# Patient Record
Sex: Male | Born: 1935 | Race: White | Hispanic: No | Smoking: Never smoker
Health system: Southern US, Community
[De-identification: ages and names within clinical notes are randomized; demographics above are authoritative.]

## PROBLEM LIST (undated history)

## (undated) ENCOUNTER — Emergency Department: Discharge status: Absent without leave | Payer: Medicare HMO

## (undated) DIAGNOSIS — R911 Solitary pulmonary nodule: Secondary | ICD-10-CM

## (undated) DIAGNOSIS — I10 Essential (primary) hypertension: Secondary | ICD-10-CM

## (undated) DIAGNOSIS — R7303 Prediabetes: Secondary | ICD-10-CM

## (undated) DIAGNOSIS — E782 Mixed hyperlipidemia: Secondary | ICD-10-CM

## (undated) DIAGNOSIS — E559 Vitamin D deficiency, unspecified: Secondary | ICD-10-CM

## (undated) DIAGNOSIS — C61 Malignant neoplasm of prostate: Secondary | ICD-10-CM

## (undated) HISTORY — DX: Vitamin D deficiency, unspecified: E55.9

## (undated) HISTORY — DX: Mixed hyperlipidemia: E78.2

## (undated) HISTORY — DX: Prediabetes: R73.03

## (undated) HISTORY — DX: Solitary pulmonary nodule: R91.1

## (undated) HISTORY — PX: TONSILECTOMY/ADENOIDECTOMY WITH MYRINGOTOMY: SHX6125

## (undated) HISTORY — PX: TREATMENT FISTULA ANAL: SUR1390

## (undated) HISTORY — DX: Essential (primary) hypertension: I10

---

## 1997-08-28 ENCOUNTER — Ambulatory Visit (HOSPITAL_COMMUNITY): Admission: RE | Admit: 1997-08-28 | Discharge: 1997-08-28 | Payer: Self-pay | Admitting: Internal Medicine

## 2000-10-07 ENCOUNTER — Ambulatory Visit (HOSPITAL_COMMUNITY): Admission: RE | Admit: 2000-10-07 | Discharge: 2000-10-07 | Payer: Self-pay | Admitting: Internal Medicine

## 2000-10-07 ENCOUNTER — Encounter: Payer: Self-pay | Admitting: Internal Medicine

## 2003-06-25 ENCOUNTER — Encounter (INDEPENDENT_AMBULATORY_CARE_PROVIDER_SITE_OTHER): Payer: Self-pay | Admitting: Specialist

## 2003-06-25 ENCOUNTER — Ambulatory Visit (HOSPITAL_COMMUNITY): Admission: RE | Admit: 2003-06-25 | Discharge: 2003-06-25 | Payer: Self-pay | Admitting: General Surgery

## 2003-07-09 ENCOUNTER — Ambulatory Visit (HOSPITAL_COMMUNITY): Admission: RE | Admit: 2003-07-09 | Discharge: 2003-07-09 | Payer: Self-pay | Admitting: General Surgery

## 2006-11-11 ENCOUNTER — Ambulatory Visit (HOSPITAL_COMMUNITY): Admission: RE | Admit: 2006-11-11 | Discharge: 2006-11-11 | Payer: Self-pay | Admitting: Internal Medicine

## 2006-11-12 ENCOUNTER — Encounter: Admission: RE | Admit: 2006-11-12 | Discharge: 2006-11-12 | Payer: Self-pay | Admitting: Internal Medicine

## 2006-11-19 ENCOUNTER — Ambulatory Visit (HOSPITAL_COMMUNITY): Admission: RE | Admit: 2006-11-19 | Discharge: 2006-11-19 | Payer: Self-pay | Admitting: Internal Medicine

## 2006-11-25 ENCOUNTER — Ambulatory Visit: Payer: Self-pay | Admitting: Internal Medicine

## 2006-11-25 LAB — CONVERTED CEMR LAB
ALT: 22 units/L (ref 0–53)
AST: 24 units/L (ref 0–37)
Albumin: 3.9 g/dL (ref 3.5–5.2)
Alkaline Phosphatase: 49 units/L (ref 39–117)
Angiotensin 1 Converting Enzyme: 39 units/L (ref 9–67)
BUN: 14 mg/dL (ref 6–23)
Basophils Absolute: 0 10*3/uL (ref 0.0–0.1)
Basophils Relative: 0.1 % (ref 0.0–1.0)
CEA: 1.5 ng/mL (ref 0.0–5.0)
CO2: 30 meq/L (ref 19–32)
Calcium: 9.4 mg/dL (ref 8.4–10.5)
Chloride: 105 meq/L (ref 96–112)
Creatinine, Ser: 1.1 mg/dL (ref 0.4–1.5)
Eosinophils Absolute: 0 10*3/uL (ref 0.0–0.6)
Eosinophils Relative: 0.6 % (ref 0.0–5.0)
GFR calc Af Amer: 85 mL/min
GFR calc non Af Amer: 70 mL/min
Glucose, Bld: 111 mg/dL — ABNORMAL HIGH (ref 70–99)
HCT: 44.7 % (ref 39.0–52.0)
Hemoglobin: 15.3 g/dL (ref 13.0–17.0)
Lymphocytes Relative: 19.8 % (ref 12.0–46.0)
MCHC: 34.1 g/dL (ref 30.0–36.0)
MCV: 91.1 fL (ref 78.0–100.0)
Monocytes Absolute: 0.4 10*3/uL (ref 0.2–0.7)
Monocytes Relative: 8.4 % (ref 3.0–11.0)
Neutro Abs: 3.9 10*3/uL (ref 1.4–7.7)
Neutrophils Relative %: 71.1 % (ref 43.0–77.0)
Platelets: 128 10*3/uL — ABNORMAL LOW (ref 150–400)
Potassium: 4.3 meq/L (ref 3.5–5.1)
RBC: 4.91 M/uL (ref 4.22–5.81)
RDW: 11.4 % — ABNORMAL LOW (ref 11.5–14.6)
Sed Rate: 4 mm/hr (ref 0–20)
Sodium: 139 meq/L (ref 135–145)
Total Bilirubin: 0.8 mg/dL (ref 0.3–1.2)
Total Protein: 6.5 g/dL (ref 6.0–8.3)
WBC: 5.3 10*3/uL (ref 4.5–10.5)

## 2007-01-21 ENCOUNTER — Ambulatory Visit: Payer: Self-pay | Admitting: Internal Medicine

## 2007-02-14 ENCOUNTER — Encounter: Admission: RE | Admit: 2007-02-14 | Discharge: 2007-02-14 | Payer: Self-pay | Admitting: Internal Medicine

## 2007-02-15 DIAGNOSIS — I10 Essential (primary) hypertension: Secondary | ICD-10-CM | POA: Insufficient documentation

## 2007-02-15 DIAGNOSIS — R911 Solitary pulmonary nodule: Secondary | ICD-10-CM

## 2007-02-15 HISTORY — DX: Essential (primary) hypertension: I10

## 2007-02-15 HISTORY — DX: Solitary pulmonary nodule: R91.1

## 2007-06-20 ENCOUNTER — Ambulatory Visit: Payer: Self-pay | Admitting: Internal Medicine

## 2007-06-21 ENCOUNTER — Telehealth (INDEPENDENT_AMBULATORY_CARE_PROVIDER_SITE_OTHER): Payer: Self-pay | Admitting: *Deleted

## 2007-10-18 ENCOUNTER — Ambulatory Visit: Payer: Self-pay | Admitting: Internal Medicine

## 2008-06-19 ENCOUNTER — Ambulatory Visit: Payer: Self-pay | Admitting: Internal Medicine

## 2010-04-15 NOTE — Progress Notes (Signed)
Summary: corrections  Phone Note Call from Patient   Caller: Patient Call For: wert Summary of Call: meds list is wrong... Corrections- Vitamin D 1000, 2 per day should be Vitamin D 2000, 2 per day.  Also Flaxseed oil 1 per day sould be Flaxseed Oil, 2 per day Initial call taken by: Eugene Gavia,  June 21, 2007 9:05 AM    New/Updated Medications: FLAXSEED OIL 1000 MG  CAPS (FLAXSEED (LINSEED)) Take 2  tablet by mouth once a day VITAMIN D 2000 UNIT  TABS (CHOLECALCIFEROL) 2 by mouth once daily

## 2010-04-15 NOTE — Assessment & Plan Note (Signed)
Summary: fu////kp   Visit Type:  Follow-up PCP:  [facility]  Chief Complaint:  follow-up.  History of Present Illness: A 77 year old white male never smoker with remote h/o sarcoidosis who originally came to medical attention after he "hurt his chest" in August of 2008 with a vague density in the right mid chest that could actually be seen on a plain chest x-ray with  a CT scan suggesting a right middle lobe nodule.  This occurred in the setting of having previously been diagnosed with sarcoid with extensive mediastinal hilar adenopathy.  PET scanning was inconclusive and he returns today feeling fine with no recurrent chest discomfort, fevers, chills, sweats, unintended weight loss, cough or dyspnea.     Current Allergies: No known allergies   Past Medical History:    Current Problems:     HYPERTENSION (ICD-401.9)    PULMONARY NODULE (ICD-518.89)             Current Meds:     ATENOLOL 50 MG  TABS (ATENOLOL) Take 1 tablet by mouth once a day    FLAXSEED OIL 1000 MG  CAPS (FLAXSEED (LINSEED)) Take 1 tablet by mouth once a day    ADULT ASPIRIN LOW STRENGTH 81 MG  TBDP (ASPIRIN) Take 1 tablet by mouth once a day    VITAMIN D 1000 UNIT  TABS (CHOLECALCIFEROL) 2 tabs once daily    FINASTERIDE 5 MG  TABS (FINASTERIDE) One tablet each day    PRAVACHOL 20 MG  TABS (PRAVASTATIN SODIUM) One tablet daily.        Family History:    neg for resp dz, sarcoid or ca  Social History:    Patient never smoked.    Risk Factors:  Tobacco use:  never   Review of Systems  The patient denies anorexia, fever, weight loss, weight gain, vision loss, decreased hearing, hoarseness, chest pain, syncope, dyspnea on exhertion, peripheral edema, prolonged cough, hemoptysis, abdominal pain, melena, hematochezia, severe indigestion/heartburn, hematuria, incontinence, muscle weakness, suspicious skin lesions, transient blindness, difficulty walking, depression, unusual weight change, abnormal  bleeding, and enlarged lymph nodes.     Vital Signs:  Patient Profile:   77 Years Old Male Height:     67 inches Weight:      146.50 pounds O2 Sat:      97 % O2 treatment:    Room Air Temp:     97.8 degrees F oral Pulse rate:   81 / minute BP sitting:   160 / 82  (left arm)  Vitals Entered By: Marinus Maw (June 20, 2007 3:55 PM)             Comments Medications reviewed with patient ..................................................................Marland KitchenMarinus Maw  June 20, 2007 3:57 PM      Physical Exam  relatively thin but healthy appearing ambulatory white male in no acute distress. Afeb with normal vital signs HEENT: nl dentition, turbinates, and orophanx. Nl external ear canals without cough reflex Neck without JVD/Nodes/TM Lungs clear to A and P bilaterally without cough on insp or exp maneuvers RRR no s3 or murmur or increase in P2 Abd soft and benign with nl excursion in the supine position. No bruits or organomegaly Ext warm without calf tenderness, cyanosis clubbing or edema Skin warm and dry without lesions       Impression & Recommendations:  Problem # 1:  PULMONARY NODULE (ICD-518.89) is present chest x-ray shows no convincing evidence of a nodule in the area that was suspicious previously on chest x-ray  dated November 11, 2006.  Therefore it is very unlikely that we are dealing with a evolving mass in a never smoker and I simply recommended a follow-up chest x-ray in 6 months rather than continue do CT scans.   Orders: T-2 View CXR, Same Day (71020.5TC) Est. Patient Level III (16109)   Medications Added to Medication List This Visit: 1)  Finasteride 5 Mg Tabs (Finasteride) .... One tablet each day 2)  Pravachol 20 Mg Tabs (Pravastatin sodium) .... One tablet daily.   Patient Instructions: 1)  Please schedule a follow-up appointment in 6 months, sooner for weight loss or cough or chest pain    ]  Appended Document: fu////kp  done,copy will be faxed.

## 2010-04-15 NOTE — Assessment & Plan Note (Signed)
Summary: Pulmonary/ final fu nodule   Primary Provider/Referring Provider:  [facility]  CC:  75 month followup with cxr.  No complaints today.Matthew Aguirre  History of Present Illness: 47 ywm never smoker with  h/o sarcoidosis  1988 who originally came to medical attention after he "hurt his chest" in August of 2008 with a vague density in the right mid chest that could actually be seen on a plain chest x-ray with  a CT scan suggesting a right middle lobe nodule.  This occurred in the setting of having previously been diagnosed with sarcoid in 1988 on prednsione for sev months and weaned off.   06/19/73  cxr showed improvement in nodule easily seen prev on cxr  June 19, 2008  returns for "6 mo" fu cxr no cough, cp, or sob, wt loss, night sweats, rash, myalgias or arthralgias.  Current Medications (verified): 1)  Atenolol 50 Mg  Tabs (Atenolol) .... Take 1 Tablet By Mouth Once A Day 2)  Adult Aspirin Low Strength 81 Mg  Tbdp (Aspirin) .... Take 1 Tablet By Mouth Once A Day 3)  Vitamin D3 10000 Unit Caps (Cholecalciferol) .Matthew Aguirre.. 1 4 Times A Day 4)  Finasteride 5 Mg  Tabs (Finasteride) .... One Tablet Each Day 5)  Fenofibrate Micronized 134 Mg  Caps (Fenofibrate Micronized) .... Once Daily 6)  Flaxseed Oil 1000 Mg  Caps (Flaxseed (Linseed)) .... Two Times A Day 7)  Multivitamins   Tabs (Multiple Vitamin) .... Once Daily  Allergies (verified): No Known Drug Allergies  Past History:  Past Medical History:    HYPERTENSION (ICD-401.9)    PULMONARY NODULE (ICD-518.89)      -by cxr 8/08      -Final fu cxr June 19, 2008     Pulmonary Sacroidosis     - dx 1988 by Alwyn Ren and prednisone x a few months    .       Vital Signs:  Patient profile:   77 year old male Weight:      140 pounds BMI:     22.01 Temp:     98.0 degrees F oral Pulse rate:   94 / minute BP sitting:   140 / 78  (left arm)  Vitals Entered By: Vernie Murders (June 19, 2008 9:43 AM)  O2 Sat on room air at rest %:  98  Physical  Exam  Additional Exam:  relatively thin but healthy appearing ambulatory white male in no acute distress. Afeb with normal vital signs wt 139 > 140 June 19, 2008  HEENT: nl dentition, turbinates, and orophanx. Nl external ear canals without cough reflex Neck without JVD/Nodes/TM Lungs clear to A and P bilaterally without cough on insp or exp maneuvers RRR no s3 or murmur or increase in P2 Abd soft and benign with nl excursion in the supine position. No bruits or organomegaly Ext warm without calf tenderness, cyanosis clubbing or edema Skin warm and dry without lesions     CXR  Procedure date:  06/19/2008  Findings:      wnl  Impression & Recommendations:  Problem # 1:  PULMONARY NODULE (ICD-518.89) No evidence of active lesions, nodules or otherwise. Most likely this was inflammatory, and doubt sarcoid so many years after initial dx.  Pulmonary fu prn  Medications Added to Medication List This Visit: 1)  Vitamin D3 10000 Unit Caps (Cholecalciferol) .Matthew Aguirre.. 1 4 times a day  Other Orders: T-1 View CXR, Same Day (71010.6TC)  Patient Instructions: 1)  Return for  unexplained cough, short of breath, pain when breathing or weight loss  Appended Document: Orders Update    Clinical Lists Changes       Appended Document: Orders Update    Clinical Lists Changes  Orders: Added new Service order of Est. Patient Level III (16109) - Signed

## 2010-04-15 NOTE — Assessment & Plan Note (Signed)
Summary: Pulmonary/fu nodule   PCP:  [facility]  Chief Complaint:  Pt returns for 4 month followup.  He has no complaints today and breathing is well.Marland Kitchen  History of Present Illness: A 77 year old white male never smoker with  h/o sarcoidosis  1988 who originally came to medical attention after he "hurt his chest" in August of 2008 with a vague density in the right mid chest that could actually be seen on a plain chest x-ray with  a CT scan suggesting a right middle lobe nodule.  This occurred in the setting of having previously been diagnosed with sarcoid with extensive mediastinal hilar adenopathy.  PET scanning was inconclusive and he returns today feeling fine with no recurrent chest discomfort, fevers, chills, sweats, unintended weight loss, cough or dyspnea.  06/20/07  cxr showed improvement in nodule easily seen prev on cxr  October 18, 2007  returns for "6 mo" fu cxr no cough, cp, or sob, wt loss, night sweats, rash, myalgias or arthralgias.     Updated Prior Medication List: ATENOLOL 50 MG  TABS (ATENOLOL) Take 1 tablet by mouth once a day ADULT ASPIRIN LOW STRENGTH 81 MG  TBDP (ASPIRIN) Take 1 tablet by mouth once a day D 1000 PLUS   TABS (FA-CYANOCOBALAMIN-B6-D-CA) three times a day FINASTERIDE 5 MG  TABS (FINASTERIDE) One tablet each day FENOFIBRATE MICRONIZED 134 MG  CAPS (FENOFIBRATE MICRONIZED) once daily FLAXSEED OIL 1000 MG  CAPS (FLAXSEED (LINSEED)) two times a day MULTIVITAMINS   TABS (MULTIPLE VITAMIN) once daily  Current Allergies: No known allergies   Past Medical History:    Reviewed history from 06/20/2007 and no changes required:       HYPERTENSION (ICD-401.9)       PULMONARY NODULE (ICD-518.89)         -by cxr 8/08       Pulmonary Sacroidosis        - dx 1988 by Alwyn Ren and prednisone x a few months       .           Family History:    Reviewed history from 06/20/2007 and no changes required:       neg for resp dz, sarcoid or ca  Social History:   Reviewed history from 06/20/2007 and no changes required:       Patient never smoked.     Review of Systems  The patient denies anorexia, fever, weight loss, weight gain, vision loss, decreased hearing, hoarseness, chest pain, syncope, dyspnea on exertion, peripheral edema, prolonged cough, headaches, hemoptysis, abdominal pain, melena, hematochezia, severe indigestion/heartburn, hematuria, incontinence, muscle weakness, suspicious skin lesions, transient blindness, difficulty walking, depression, unusual weight change, abnormal bleeding, enlarged lymph nodes, breast masses, and testicular masses.     Vital Signs:  Patient Profile:   77 Years Old Male Height:     67 inches Weight:      139.13 pounds O2 Sat:      99 % O2 treatment:    Room Air Temp:     98.3 degrees F oral Pulse rate:   82 / minute BP sitting:   150 / 80  (left arm)  Vitals Entered By: Vernie Murders (October 18, 2007 9:33 AM)                 Physical Exam  relatively thin but healthy appearing ambulatory white male in no acute distress. Afeb with normal vital signs HEENT: nl dentition, turbinates, and orophanx. Nl external ear canals without cough reflex  Neck without JVD/Nodes/TM Lungs clear to A and P bilaterally without cough on insp or exp maneuvers RRR no s3 or murmur or increase in P2 Abd soft and benign with nl excursion in the supine position. No bruits or organomegaly Ext warm without calf tenderness, cyanosis clubbing or edema Skin warm and dry without lesions     CXR  Procedure date:  10/18/2007  Findings:      no def nodule where prev obvious on plain film     Impression & Recommendations:  Problem # 1:  PULMONARY NODULE (ICD-518.89) no evidence or active sarcoid or neoplasm. Placed in tickle file for repeat cxr at 6mon to complete 2 years of fu 8/10   most likely this was inflammatory and thus the positive PET, although it's difficult to be 100% confident about this and the best  way to be sure is follow him out to 2 years.  Discussed in detail all the  indications, usual  risks and alternatives  relative to the benefits with patient who agrees to proceed with conservative rx.    Medications Added to Medication List This Visit: 1)  D 1000 Plus Tabs (Fa-cyanocobalamin-b6-d-ca) .... Three times a day 2)  Fenofibrate Micronized 134 Mg Caps (Fenofibrate micronized) .... Once daily 3)  Flaxseed Oil 1000 Mg Caps (Flaxseed (linseed)) .... Two times a day 4)  Multivitamins Tabs (Multiple vitamin) .... Once daily   Patient Instructions: 1)  return in 6 months for cxr   ]

## 2010-07-29 NOTE — Assessment & Plan Note (Signed)
[facility]                             PULMONARY OFFICE NOTE   Matthew Aguirre, Matthew Aguirre                   MRN:          [id number]  DATE:11/25/2006                            DOB:          1933/12/28    HISTORY:  This is a 77 year old white male who was diagnosed by Dr.  Alwyn Ren in 2008 with sarcoid.  Additionally, he says that he was  diagnosed on an incidental x-ray and had no pulmonary symptoms at all  then.  I reviewed the original interview form on the [place] record from  September 04, 1986 and indeed this was true.  He denied any significant  cough, dyspnea, myalgias, arthralgias or ocular complaints.  He had an  ACE level of 70 then on transbronchial biopsy dated September 06, 1986.  He  had evidence of a noncaseating granulomatous inflammation consistent  with sarcoid.  He was treated with prednisone and had marked improvement  in reticular nodular infiltrates on chest x-ray, but again, he had no  symptoms to start with and has no symptoms at the end of treatment.  We  do not have recorded how long he took the prednisone, but he thinks it  was for less than a year.   The reason he is here now is that a routine chest x-ray suggested  increased nodular changes, especially of concern in the right lower lobe  and this led to a PET scan dated November 19, 2006, indicating an SUV of  3.8, consistent with either active granulomatous process or malignancy.  Again, the patient denies any symptoms at all, that is, no cough,  pleuritic pain, fever, chills, sweat, unintended weight loss, orthopnea,  leg swelling, myalgias, arthralgias or rash and states that all he has  is cataracts when I ask him about his eye symptoms.   PAST MEDICAL HISTORY:  1. Hypertension.  2. Fistula repair in 2005.   ALLERGIES:  None known.   MEDICATIONS:  Atenolol, Zetia, Avodart, flaxseed oil, aspirin,  multivitamins and vitamin D.   SOCIAL HISTORY:  He has never smoked  cigarettes.  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   FAMILY HISTORY:  Negative for lung cancer or sarcoid.   REVIEW OF SYSTEMS:  Taken in detail on the worksheet and negative except  as outlined above.   PHYSICAL EXAMINATION:  GENERAL:  This is a stoic white male with a bit  of a peculiar affect and attitude.  VITAL SIGNS:  He has stable vital signs.  HEENT:  Unremarkable.  Oropharynx clear.  Dentition intact.  NECK:  Supple without cervical adenopathy or tenderness.  Trachea is  midline.  There is no thyromegaly.  LUNGS:  Lung fields are perfectly clear bilaterally to auscultation and  percussion.  CARDIAC:  There is a regular rhythm without murmur, gallop or rub.  ABDOMEN:  Soft and benign with no palpable organomegaly, mass or  tenderness.  There was no increase in P2.  EXTREMITIES:  Warm without calf tenderness, cyanosis, clubbing, edema or  rash.   Chest CT scan and PET scan as above.   Labs studies from July indicate  no elevation of protein relative to  albumin or calcium levels.   IMPRESSION:  Probable smoldering sarcoid dating back 20 years, although  I suppose it is possible he could have a scar carcinoma or a lung  cancer in the non-smoking setting; my personal experience of this has  been extremely rare and is typically seen in widows of patients who have  been very heavy smokers or patients who have a positive family history.   I did recommend an ACE level today along with a sedimentation rate and  CEA level, but would not recommend at this point an excisional biopsy,  which would the only way to address the issue effectively and I do not  believe that the x-ray findings are worrisome enough for this purpose  and I discussed this openly with the patient.   I did arrange to see the patient back for a full set of pulmonary  function tests and another chest x-ray.  If there is definitely  progression of a single lesion that stands out from the other reticular  nodular  changes that have been present chronically, then I would  consider at that point an excisional biopsy, unless there is more  evidence clinically of sarcoid, which also seems a bit unlikely here,  given the duration that he has been followed conservatively without a  relapse, although it certainly has been reported in the past  anecdotally.     Charlaine Dalton. Sherene Sires, MD, [facility]  Electronically Signed    MBW/MedQ  DD: 11/25/2006  DT: 11/26/2006  Job #: [id number]   cc:   Lucky Cowboy, M.D.

## 2010-07-29 NOTE — Assessment & Plan Note (Signed)
[facility]                             PULMONARY OFFICE NOTE   Aguirre, Matthew                   MRN:          [id number]  DATE:01/21/2007                            DOB:          12/04/1933    HISTORY:  A 77 year old white male who originally came to medical  attention after he hurt his chest in August of 2008 with a normal  chest x-ray baseline, but a CT scan suggesting a right middle lobe  nodule.  This occurred in the setting of having previously been  diagnosed with sarcoid with extensive mediastinal hilar adenopathy.  PET  scanning was inconclusive and he returns today feeling fine with no  recurrent chest discomfort, fevers, chills, sweats, unintended weight  loss, cough or dyspnea.   MEDICATIONS:  Reviewed in detail with the patient, inventory on the  patient sheet, column dated January 21, 2007.   PHYSICAL EXAMINATION:  This is a pleasant, ambulatory white male in no  acute distress.  He is afebrile with normal vital signs.  HEENT:  Unremarkable.  Oropharynx is clear.  Lung fields are perfectly clear bilaterally to auscultation and  percussion.  HEART:  Regular rhythm without murmurs, gallops, or rubs.  ABDOMEN:  Soft, benign.  EXTREMITIES:  Warm without calf tenderness, cyanosis, clubbing, or  edema.   Hemoglobin saturation is 99% on room air.   There are no baseline x-rays available from his diagnosis of sarcoid  which was made over 20 years ago to compare.   Pulmonary function tests were performed today and are completely normal.   I performed sed rate and ACE levels on him in September, and these were  also normal, as were protein, albumin, and ACE and CEA levels.   IMPRESSION:  Asymptomatic pulmonary nodule (I do not think that it is  very likely at all that the chest discomfort that was fleeting in nature  has anything to do with the nodule, except that it was discovered  incidentally after a CT scan was done to  pursue a specific diagnosis).  There is concern because of the positive PET scan that he may have  malignant disease, therefore, I recommended a 78-month CT scan.  If there  is any growth at all of this lesion in 3 months, especially in the  absence of active sarcoid which is lacking, I would proceed directly to  an excisional biopsy.  If there is no change then I would recommend  another CT scan at 6 months from that point.   I spent extra time going over this patient's history with him, again, in  detail to make sure I understood the context in which he was previously  diagnosed with sarcoid versus his present CT scan, which is very  convincing to me that first of all the sarcoid symptoms do not appear to  have recurred, and secondly that the chest discomfort that triggered the  CT scan is totally resolved.  If we had prior x-rays dating back several  years, we probably could see the lesion if it was sarcoid related and  define whether it is truly stable  or not, and in the absence of this  ability are stuck with doing limited CT scans through the area versus  proceeding directly to an excisional biopsy, which the patient is not  enthusiastic about considering.     Charlaine Dalton. Sherene Sires, MD, [facility]  Electronically Signed    MBW/MedQ  DD: 01/21/2007  DT: 01/22/2007  Job #: [id number]   cc:   Lucky Cowboy, M.D.

## 2010-08-01 NOTE — Op Note (Signed)
NAME:  Matthew Aguirre, Matthew Aguirre                      ACCOUNT NO.:  [id number]   MEDICAL RECORD NO.:  [id number]                   PATIENT TYPE:  AMB   LOCATION:  DAY                                  FACILITY[facility]  [facility]   PHYSICIAN:  Timothy E. Earlene Plater, M.D.              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   PROCEDURE:  Repair of fistula-in-ano.   SURGEON:  Kendrick Ranch, M.D.   ANESTHESIA:  General.   INDICATIONS FOR PROCEDURE:  [name] is 72, has been seen and followed  in the office for considerable time treatment of an anal fissure which has  partially responded.  However, in the past 6 weeks, he has developed sepsis  in the area of fissure with fistula formation.  After treatment with  antibiotics and other conservative management, he comes to surgery and  understands.   The patient taken to the operating room, placed supine, general LMA  anesthesia provided.  He was placed in lithotomy, perianal area inspected,  prepped, and draped in the usual fashion.  A deep fissure/fistula was  present posteriorly.  Area was gently probed with a malleable probe and fell  beneath the internal sphincter to the outside skin just to the right of the  posterior midline.  This area was opened completely, debrided, and then the  base wall cauterized.  There was no other pathology except for indolent  hemorrhoids, which he preferred not to have treated at this time.  The area  was carefully inspected.  Gelfoam, gauze, and a dry sterile dressing  applied.  He tolerated it well, was awakened, and taken to the recovery room  in good condition.   Written and verbal instructions including Percocet #36 were given to him and  his wife, and he will be seen and followed as an outpatient.         Timothy E. Earlene Plater, M.D.    TED/MEDQ  D:  06/25/2003  T:  06/25/2003  Job:  [id number]

## 2011-06-24 ENCOUNTER — Encounter: Payer: Self-pay | Admitting: Gastroenterology

## 2011-07-01 DIAGNOSIS — N402 Nodular prostate without lower urinary tract symptoms: Secondary | ICD-10-CM | POA: Diagnosis not present

## 2011-07-01 DIAGNOSIS — R972 Elevated prostate specific antigen [PSA]: Secondary | ICD-10-CM | POA: Diagnosis not present

## 2011-07-01 DIAGNOSIS — Z1212 Encounter for screening for malignant neoplasm of rectum: Secondary | ICD-10-CM | POA: Diagnosis not present

## 2011-07-01 DIAGNOSIS — E559 Vitamin D deficiency, unspecified: Secondary | ICD-10-CM | POA: Diagnosis not present

## 2011-07-01 DIAGNOSIS — Z79899 Other long term (current) drug therapy: Secondary | ICD-10-CM | POA: Diagnosis not present

## 2011-07-01 DIAGNOSIS — R7309 Other abnormal glucose: Secondary | ICD-10-CM | POA: Diagnosis not present

## 2011-07-01 DIAGNOSIS — I10 Essential (primary) hypertension: Secondary | ICD-10-CM | POA: Diagnosis not present

## 2011-07-01 DIAGNOSIS — E782 Mixed hyperlipidemia: Secondary | ICD-10-CM | POA: Diagnosis not present

## 2011-07-01 DIAGNOSIS — Z125 Encounter for screening for malignant neoplasm of prostate: Secondary | ICD-10-CM | POA: Diagnosis not present

## 2011-07-27 DIAGNOSIS — R972 Elevated prostate specific antigen [PSA]: Secondary | ICD-10-CM | POA: Diagnosis not present

## 2011-07-27 DIAGNOSIS — N401 Enlarged prostate with lower urinary tract symptoms: Secondary | ICD-10-CM | POA: Diagnosis not present

## 2011-09-30 DIAGNOSIS — E782 Mixed hyperlipidemia: Secondary | ICD-10-CM | POA: Diagnosis not present

## 2011-09-30 DIAGNOSIS — Z79899 Other long term (current) drug therapy: Secondary | ICD-10-CM | POA: Diagnosis not present

## 2011-09-30 DIAGNOSIS — I10 Essential (primary) hypertension: Secondary | ICD-10-CM | POA: Diagnosis not present

## 2011-09-30 DIAGNOSIS — R7309 Other abnormal glucose: Secondary | ICD-10-CM | POA: Diagnosis not present

## 2011-09-30 DIAGNOSIS — E559 Vitamin D deficiency, unspecified: Secondary | ICD-10-CM | POA: Diagnosis not present

## 2011-10-12 DIAGNOSIS — Z961 Presence of intraocular lens: Secondary | ICD-10-CM | POA: Diagnosis not present

## 2011-10-29 DIAGNOSIS — R972 Elevated prostate specific antigen [PSA]: Secondary | ICD-10-CM | POA: Diagnosis not present

## 2011-11-05 DIAGNOSIS — N401 Enlarged prostate with lower urinary tract symptoms: Secondary | ICD-10-CM | POA: Diagnosis not present

## 2011-11-05 DIAGNOSIS — R972 Elevated prostate specific antigen [PSA]: Secondary | ICD-10-CM | POA: Diagnosis not present

## 2011-12-08 DIAGNOSIS — L02529 Furuncle unspecified hand: Secondary | ICD-10-CM | POA: Diagnosis not present

## 2011-12-08 DIAGNOSIS — L923 Foreign body granuloma of the skin and subcutaneous tissue: Secondary | ICD-10-CM | POA: Diagnosis not present

## 2011-12-08 DIAGNOSIS — L02539 Carbuncle of unspecified hand: Secondary | ICD-10-CM | POA: Diagnosis not present

## 2011-12-30 DIAGNOSIS — R7309 Other abnormal glucose: Secondary | ICD-10-CM | POA: Diagnosis not present

## 2011-12-30 DIAGNOSIS — E782 Mixed hyperlipidemia: Secondary | ICD-10-CM | POA: Diagnosis not present

## 2011-12-30 DIAGNOSIS — Z79899 Other long term (current) drug therapy: Secondary | ICD-10-CM | POA: Diagnosis not present

## 2011-12-30 DIAGNOSIS — E559 Vitamin D deficiency, unspecified: Secondary | ICD-10-CM | POA: Diagnosis not present

## 2011-12-30 DIAGNOSIS — I10 Essential (primary) hypertension: Secondary | ICD-10-CM | POA: Diagnosis not present

## 2011-12-30 DIAGNOSIS — Z23 Encounter for immunization: Secondary | ICD-10-CM | POA: Diagnosis not present

## 2012-03-16 HISTORY — PX: PROSTATE BIOPSY: SHX241

## 2012-03-24 ENCOUNTER — Encounter: Payer: Self-pay | Admitting: Gastroenterology

## 2012-04-26 DIAGNOSIS — R972 Elevated prostate specific antigen [PSA]: Secondary | ICD-10-CM | POA: Diagnosis not present

## 2012-05-05 DIAGNOSIS — R972 Elevated prostate specific antigen [PSA]: Secondary | ICD-10-CM | POA: Diagnosis not present

## 2012-05-05 DIAGNOSIS — N401 Enlarged prostate with lower urinary tract symptoms: Secondary | ICD-10-CM | POA: Diagnosis not present

## 2012-06-21 DIAGNOSIS — R972 Elevated prostate specific antigen [PSA]: Secondary | ICD-10-CM | POA: Diagnosis not present

## 2012-07-06 DIAGNOSIS — I10 Essential (primary) hypertension: Secondary | ICD-10-CM | POA: Diagnosis not present

## 2012-07-06 DIAGNOSIS — E559 Vitamin D deficiency, unspecified: Secondary | ICD-10-CM | POA: Diagnosis not present

## 2012-07-06 DIAGNOSIS — B079 Viral wart, unspecified: Secondary | ICD-10-CM | POA: Diagnosis not present

## 2012-07-06 DIAGNOSIS — R7309 Other abnormal glucose: Secondary | ICD-10-CM | POA: Diagnosis not present

## 2012-07-06 DIAGNOSIS — E782 Mixed hyperlipidemia: Secondary | ICD-10-CM | POA: Diagnosis not present

## 2012-07-06 DIAGNOSIS — Z1212 Encounter for screening for malignant neoplasm of rectum: Secondary | ICD-10-CM | POA: Diagnosis not present

## 2012-07-06 DIAGNOSIS — Z79899 Other long term (current) drug therapy: Secondary | ICD-10-CM | POA: Diagnosis not present

## 2012-09-09 ENCOUNTER — Encounter: Payer: Self-pay | Admitting: Gastroenterology

## 2012-12-01 DIAGNOSIS — Z961 Presence of intraocular lens: Secondary | ICD-10-CM | POA: Diagnosis not present

## 2012-12-20 DIAGNOSIS — C61 Malignant neoplasm of prostate: Secondary | ICD-10-CM | POA: Diagnosis not present

## 2012-12-27 DIAGNOSIS — C61 Malignant neoplasm of prostate: Secondary | ICD-10-CM | POA: Diagnosis not present

## 2013-01-10 DIAGNOSIS — Z23 Encounter for immunization: Secondary | ICD-10-CM | POA: Diagnosis not present

## 2013-01-10 DIAGNOSIS — R7309 Other abnormal glucose: Secondary | ICD-10-CM | POA: Diagnosis not present

## 2013-01-10 DIAGNOSIS — Z79899 Other long term (current) drug therapy: Secondary | ICD-10-CM | POA: Diagnosis not present

## 2013-01-10 DIAGNOSIS — I10 Essential (primary) hypertension: Secondary | ICD-10-CM | POA: Diagnosis not present

## 2013-01-10 DIAGNOSIS — E782 Mixed hyperlipidemia: Secondary | ICD-10-CM | POA: Diagnosis not present

## 2013-01-10 DIAGNOSIS — E559 Vitamin D deficiency, unspecified: Secondary | ICD-10-CM | POA: Diagnosis not present

## 2013-03-22 DIAGNOSIS — C61 Malignant neoplasm of prostate: Secondary | ICD-10-CM | POA: Diagnosis not present

## 2013-03-29 DIAGNOSIS — N401 Enlarged prostate with lower urinary tract symptoms: Secondary | ICD-10-CM | POA: Diagnosis not present

## 2013-03-29 DIAGNOSIS — C61 Malignant neoplasm of prostate: Secondary | ICD-10-CM | POA: Diagnosis not present

## 2013-03-29 DIAGNOSIS — N139 Obstructive and reflux uropathy, unspecified: Secondary | ICD-10-CM | POA: Diagnosis not present

## 2013-07-03 ENCOUNTER — Encounter: Payer: Self-pay | Admitting: Physician Assistant

## 2013-07-03 ENCOUNTER — Ambulatory Visit (INDEPENDENT_AMBULATORY_CARE_PROVIDER_SITE_OTHER): Payer: Medicare Other | Admitting: Physician Assistant

## 2013-07-03 VITALS — BP 128/70 | HR 80 | Temp 98.6°F | Resp 16 | Ht 66.0 in | Wt 142.0 lb

## 2013-07-03 DIAGNOSIS — W57XXXA Bitten or stung by nonvenomous insect and other nonvenomous arthropods, initial encounter: Secondary | ICD-10-CM

## 2013-07-03 DIAGNOSIS — S30861A Insect bite (nonvenomous) of abdominal wall, initial encounter: Secondary | ICD-10-CM

## 2013-07-03 DIAGNOSIS — S30860A Insect bite (nonvenomous) of lower back and pelvis, initial encounter: Secondary | ICD-10-CM

## 2013-07-03 NOTE — Progress Notes (Signed)
   Subjective:    Patient ID: Matthew Aguirre, male    DOB: [birth year], 80 y.o.   MRN: [id number]  HPI 80 y.o. male with history of tick bite. He states he noticed a very small tick on right lower abdomen likely on for less than a day. He states he put peroxide on it, it was red, dried up and then on Saturday he noticed it itching with a rash on it. Denies fever, HA, joint pain, myalgias,    Review of Systems  Skin: Positive for rash.  All other systems reviewed and are negative.      Objective:   Physical Exam  Constitutional: He is oriented to person, place, and time. He appears well-developed and well-nourished.  HENT:  Head: Normocephalic and atraumatic.  Right Ear: External ear normal.  Left Ear: External ear normal.  Mouth/Throat: Oropharynx is clear and moist.  Eyes: Conjunctivae and EOM are normal. Pupils are equal, round, and reactive to light.  Neck: Normal range of motion. Neck supple.  Cardiovascular: Normal rate, regular rhythm and normal heart sounds.   Pulmonary/Chest: Effort normal and breath sounds normal.  Abdominal: Soft. Bowel sounds are normal.  Musculoskeletal: Normal range of motion.  Neurological: He is alert and oriented to person, place, and time. No cranial nerve deficit.  Skin: Skin is warm and dry.  Right suprapubic area with small punctuate from bite and inferior to that an area of 1.5 horizontal and 0.6 inches vertically erythematous, small petechiae, excoriations.   Psychiatric: He has a normal mood and affect. His behavior is normal.       Assessment & Plan:  Tick bite, not infected, low probability due to duration, asymptomatic will wait, treat topically, has CPE in 10 days, will check at that point if symptoms or need titier

## 2013-07-03 NOTE — Patient Instructions (Signed)
Tick Bite Information Ticks are insects that attach themselves to the skin and draw blood for food. There are various types of ticks. Common types include wood ticks and deer ticks. Most ticks live in shrubs and grassy areas. Ticks can climb onto your body when you make contact with leaves or grass where the tick is waiting. The most common places on the body for ticks to attach themselves are the scalp, neck, armpits, waist, and groin. Most tick bites are harmless, but sometimes ticks carry germs that cause diseases. These germs can be spread to a person during the tick's feeding process. The chance of a disease spreading through a tick bite depends on:   The type of tick.  Time of year.   How long the tick is attached.   Geographic location.  HOW CAN YOU PREVENT TICK BITES? Take these steps to help prevent tick bites when you are outdoors:  Wear protective clothing. Long sleeves and long pants are best.   Wear white clothes so you can see ticks more easily.  Tuck your pant legs into your socks.   If walking on a trail, stay in the middle of the trail to avoid brushing against bushes.  Avoid walking through areas with long grass.  Put insect repellent on all exposed skin and along boot tops, pant legs, and sleeve cuffs.   Check clothing, hair, and skin repeatedly and before going inside.   Brush off any ticks that are not attached.  Take a shower or bath as soon as possible after being outdoors.  WHAT IS THE PROPER WAY TO REMOVE A TICK? Ticks should be removed as soon as possible to help prevent diseases caused by tick bites. 1. If latex gloves are available, put them on before trying to remove a tick.  2. Using fine-point tweezers, grasp the tick as close to the skin as possible. You may also use curved forceps or a tick removal tool. Grasp the tick as close to its head as possible. Avoid grasping the tick on its body. 3. Pull gently with steady upward pressure until  the tick lets go. Do not twist the tick or jerk it suddenly. This may break off the tick's head or mouth parts. 4. Do not squeeze or crush the tick's body. This could force disease-carrying fluids from the tick into your body.  5. After the tick is removed, wash the bite area and your hands with soap and water or other disinfectant such as alcohol. 6. Apply a small amount of antiseptic cream or ointment to the bite site.  7. Wash and disinfect any instruments that were used.  Do not try to remove a tick by applying a hot match, petroleum jelly, or fingernail polish to the tick. These methods do not work and may increase the chances of disease being spread from the tick bite.  WHEN SHOULD YOU SEEK MEDICAL CARE? Contact your health care provider if you are unable to remove a tick from your skin or if a part of the tick breaks off and is stuck in the skin.  After a tick bite, you need to be aware of signs and symptoms that could be related to diseases spread by ticks. Contact your health care provider if you develop any of the following in the days or weeks after the tick bite:  Unexplained fever.  Rash. A circular rash that appears days or weeks after the tick bite may indicate the possibility of Lyme disease. The rash may resemble   a target with a bull's-eye and may occur at a different part of your body than the tick bite.  Redness and swelling in the area of the tick bite.   Tender, swollen lymph glands.   Diarrhea.   Weight loss.   Cough.   Fatigue.   Muscle, joint, or bone pain.   Abdominal pain.   Headache.   Lethargy or a change in your level of consciousness.  Difficulty walking or moving your legs.   Numbness in the legs.   Paralysis.  Shortness of breath.   Confusion.   Repeated vomiting.  Document Released: 02/28/2000 Document Revised: 12/21/2012 Document Reviewed: 08/10/2012 [employer] Patient Information 2014 [employer].  

## 2013-07-13 ENCOUNTER — Ambulatory Visit: Payer: Medicare Other | Admitting: Internal Medicine

## 2013-07-13 ENCOUNTER — Encounter: Payer: Self-pay | Admitting: Internal Medicine

## 2013-07-13 VITALS — BP 140/82 | HR 76 | Temp 98.1°F | Resp 16 | Ht 66.0 in | Wt 142.8 lb

## 2013-07-13 DIAGNOSIS — R7303 Prediabetes: Secondary | ICD-10-CM

## 2013-07-13 DIAGNOSIS — J984 Other disorders of lung: Secondary | ICD-10-CM

## 2013-07-13 DIAGNOSIS — Z79899 Other long term (current) drug therapy: Secondary | ICD-10-CM | POA: Diagnosis not present

## 2013-07-13 DIAGNOSIS — E559 Vitamin D deficiency, unspecified: Secondary | ICD-10-CM

## 2013-07-13 DIAGNOSIS — I10 Essential (primary) hypertension: Secondary | ICD-10-CM

## 2013-07-13 DIAGNOSIS — Z125 Encounter for screening for malignant neoplasm of prostate: Secondary | ICD-10-CM

## 2013-07-13 DIAGNOSIS — R7309 Other abnormal glucose: Secondary | ICD-10-CM

## 2013-07-13 DIAGNOSIS — E782 Mixed hyperlipidemia: Secondary | ICD-10-CM | POA: Insufficient documentation

## 2013-07-13 DIAGNOSIS — Z1212 Encounter for screening for malignant neoplasm of rectum: Secondary | ICD-10-CM

## 2013-07-13 DIAGNOSIS — Z1331 Encounter for screening for depression: Secondary | ICD-10-CM

## 2013-07-13 DIAGNOSIS — Z789 Other specified health status: Secondary | ICD-10-CM

## 2013-07-13 HISTORY — DX: Mixed hyperlipidemia: E78.2

## 2013-07-13 HISTORY — DX: Prediabetes: R73.03

## 2013-07-13 HISTORY — DX: Vitamin D deficiency, unspecified: E55.9

## 2013-07-13 LAB — CBC WITH DIFFERENTIAL/PLATELET
Basophils Absolute: 0 10*3/uL (ref 0.0–0.1)
Basophils Relative: 0 % (ref 0–1)
Eosinophils Absolute: 0.1 10*3/uL (ref 0.0–0.7)
Eosinophils Relative: 1 % (ref 0–5)
HCT: 45.1 % (ref 39.0–52.0)
Hemoglobin: 15.4 g/dL (ref 13.0–17.0)
Lymphocytes Relative: 29 % (ref 12–46)
Lymphs Abs: 1.6 10*3/uL (ref 0.7–4.0)
MCH: 30.3 pg (ref 26.0–34.0)
MCHC: 34.1 g/dL (ref 30.0–36.0)
MCV: 88.8 fL (ref 78.0–100.0)
Monocytes Absolute: 0.4 10*3/uL (ref 0.1–1.0)
Monocytes Relative: 8 % (ref 3–12)
Neutro Abs: 3.3 10*3/uL (ref 1.7–7.7)
Neutrophils Relative %: 62 % (ref 43–77)
Platelets: 145 10*3/uL — ABNORMAL LOW (ref 150–400)
RBC: 5.08 MIL/uL (ref 4.22–5.81)
RDW: 13.2 % (ref 11.5–15.5)
WBC: 5.4 10*3/uL (ref 4.0–10.5)

## 2013-07-13 LAB — HEMOGLOBIN A1C
Hgb A1c MFr Bld: 5.6 % (ref <5.7)
Mean Plasma Glucose: 114 mg/dL (ref <117)

## 2013-07-13 MED ORDER — RANITIDINE HCL 300 MG PO TABS: 300.0000 mg | ORAL_TABLET | Freq: Every day | ORAL | Status: DC

## 2013-07-13 NOTE — Progress Notes (Signed)
Patient ID: Matthew Aguirre, male   DOB: 1933-12-03, 80 y.o.   MRN: [id number]   Annual Comprehensive Examination  This very nice 80 y.o.  MWM presents for complete physical.  Patient has been followed for HTN, Diabetes  Prediabetes, Hyperlipidemia, and Vitamin D Deficiency.   HTN predates since 2007. Patient's BP has been controlled and today's BP: 140/82 mmHg. Patient denies any cardiac symptoms as chest pain, palpitations, shortness of breath, dizziness or ankle swelling.   Patient's hyperlipidemia is controlled with diet. Patient denies myalgias or other medication SE's. Last cholesterol last visit was 181, triglycerides 236, HDL 35 and LDL 99 in Oct 2014 - at goal.     Patient has prediabetes with A1c 5.9% in 2011 and last A1c was5.6% in Oct 2014. Patient denies reactive hypoglycemic symptoms, visual blurring, diabetic polys, or paresthesias.    Finally, patient has history of Vitamin D Deficiency of 41 in 2008 and last vitamin D was73 in Oct 2014.  Medication Sig  . atenolol (TENORMIN) 50 MG tablet Take 50 mg by mouth daily.  Marland Kitchen BABY ASPIRIN  Take 81 mg by mouth daily.  . Cholecalciferol (VITAMIN D) Take 2,000 Int'l Units by mouth 2 (two) times daily.   . finasteride (PROSCAR) 5 MG tablet Take 5 mg by mouth daily.  Marland Kitchen FLAX SEED OIL  Take 1,200 mg by mouth 3 (three) times daily.   . Magnesium 250 MG TABS Take 250 mg by mouth daily.  Marland Kitchen FISH OIL Take 1,000 mg by mouth 2 (two) times daily.    Allergies  Allergen Reactions  . Pravastatin   . Red Yeast Rice [Cholestin]   . Sulfa Antibiotics   . Vibramycin [Doxycycline Calcium]   . Zetia [Ezetimibe]    PMHx significant for remote pulmonary Sarcoid, HTN, PreDiabetes, Vitamin D Deficiency,  BPH, Prostatism and Prostate Carcinoma (being followed by Dr Junious Silk) and Hyperlipidemia  No past surgical history on file.  No family history on file.  History   Social History  . Marital Status: Married x 55 years    Spouse Name: N/A   Number of Children: none  . Years of Education: N/A   Occupational History  . 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 Smoking status: Never Smoker   . Smokeless tobacco: Not on file  . Alcohol Use: No  . Drug Use: Not on file  . Sexual Activity: Active    ROS Constitutional: Denies fever, chills, weight loss/gain, headaches, insomnia, fatigue, night sweats, and change in appetite. Eyes: Denies redness, blurred vision, diplopia, discharge, itchy, watery eyes.  ENT: Denies discharge, congestion, post nasal drip, epistaxis, sore throat, earache, hearing loss, dental pain, Tinnitus, Vertigo, Sinus pain, snoring.  Cardio: Denies chest pain, palpitations, irregular heartbeat, syncope, dyspnea, diaphoresis, orthopnea, PND, claudication, edema Respiratory: denies cough, dyspnea, DOE, pleurisy, hoarseness, laryngitis, wheezing.  Gastrointestinal: Denies dysphagia, heartburn, reflux, water brash, pain, cramps, nausea, vomiting, bloating, diarrhea, constipation, hematemesis, melena, hematochezia, jaundice, hemorrhoids Genitourinary: Denies dysuria, frequency, urgency, nocturia, hesitancy, discharge, hematuria, flank pain Musculoskeletal: Denies arthralgia, myalgia, stiffness, Jt. Swelling, pain, limp, and strain/sprain. Skin: Denies puritis, rash, hives, warts, acne, eczema, changing in skin lesion Neuro: No weakness, tremor, incoordination, spasms, paresthesia, pain Psychiatric: Denies confusion, memory loss, sensory loss Endocrine: Denies change in weight, skin, hair change, nocturia, and paresthesia, diabetic polys, visual blurring, hyper / hypo glycemic episodes.  Heme/Lymph: No excessive bleeding, bruising, or elarged lymph nodes.  Physical Exam  BP 140/82  Pulse 76  Temp 98.1 F   Resp 16  Ht 5\' 6"    Wt 142 lb 12.8 oz   BMI 23.06 kg/m2  General Appearance: Well nourished, in no apparent distress. Eyes: PERRLA, EOMs, conjunctiva no swelling or erythema,  normal fundi and vessels. Sinuses: No frontal/maxillary tenderness ENT/Mouth: EACs patent / TMs  nl. Nares clear without erythema, swelling, mucoid exudates. Oral hygiene is good. No erythema, swelling, or exudate. Tongue normal, non-obstructing. Tonsils not swollen or erythematous. Hearing normal.  Neck: Supple, thyroid normal. No bruits, nodes or JVD. Respiratory: Respiratory effort normal.  BS equal and clear bilateral without rales, rhonci, wheezing or stridor. Cardio: Heart sounds are normal with regular rate and rhythm and no murmurs, rubs or gallops. Peripheral pulses are normal and equal bilaterally without edema. No aortic or femoral bruits. Chest: symmetric with normal excursions and percussion.  Abdomen: Flat, soft, with bowl sounds. Nontender, no guarding, rebound, hernias, masses, or organomegaly.  Lymphatics: Non tender without lymphadenopathy.  Genitourinary: No hernias.Testes nl. DRE - prostate nl for age - smooth & firm w/o nodules. Musculoskeletal: Full ROM all peripheral extremities, joint stability, 5/5 strength, and normal gait. Skin: Warm and dry without rashes, lesions, cyanosis, clubbing or  ecchymosis.  Neuro: Cranial nerves intact, reflexes equal bilaterally. Normal muscle tone, no cerebellar symptoms. Sensation intact.  Pysch: Awake and oriented X 3, normal affect, insight and judgment appropriate.   Assessment and Plan  1. Annual  Examination 2. Hypertension  3. Hyperlipidemia 4. Pre Diabetes 5. Vitamin D Deficiency  Continue prudent diet as discussed, weight control, BP monitoring, regular exercise, and medications as discussed.  Discussed med effects and SE's. Routine screening labs and tests as requested with regular follow-up as recommended.

## 2013-07-13 NOTE — Patient Instructions (Addendum)
 Diet for Gastroesophageal Reflux Disease, Adult Reflux (acid reflux) is when acid from your stomach flows up into the esophagus. When acid comes in contact with the esophagus, the acid causes irritation and soreness (inflammation) in the esophagus. When reflux happens often or so severely that it causes damage to the esophagus, it is called gastroesophageal reflux disease (GERD). Nutrition therapy can help ease the discomfort of GERD. FOODS OR DRINKS TO AVOID OR LIMIT  Smoking or chewing tobacco. Nicotine is one of the most potent stimulants to acid production in the gastrointestinal tract.  Caffeinated and decaffeinated coffee and black tea.  Regular or low-calorie carbonated beverages or energy drinks (caffeine-free carbonated beverages are allowed).   Strong spices, such as black pepper, white pepper, red pepper, cayenne, curry powder, and chili powder.  Peppermint or spearmint.  Chocolate.  High-fat foods, including meats and fried foods. Extra added fats including oils, butter, salad dressings, and nuts. Limit these to less than 8 tsp per day.  Fruits and vegetables if they are not tolerated, such as citrus fruits or tomatoes.  Alcohol.  Any food that seems to aggravate your condition. If you have questions regarding your diet, call your caregiver or a registered dietitian. OTHER THINGS THAT MAY HELP GERD INCLUDE:   Eating your meals slowly, in a relaxed setting.  Eating 5 to 6 small meals per day instead of 3 large meals.  Eliminating food for a period of time if it causes distress.  Not lying down until 3 hours after eating a meal.  Keeping the head of your bed raised 6 to 9 inches (15 to 23 cm) by using a foam wedge or blocks under the legs of the bed. Lying flat may make symptoms worse.  Being physically active. Weight loss may be helpful in reducing reflux in overweight or obese adults.  Wear loose fitting clothing EXAMPLE MEAL PLAN This meal plan is  approximately 2,000 calories based on [employer] meal planning guidelines. Breakfast   cup cooked oatmeal.  1 cup strawberries.  1 cup low-fat milk.  1 oz almonds. Snack  1 cup cucumber slices.  6 oz yogurt (made from low-fat or fat-free milk). Lunch  2 slice whole-wheat bread.  2 oz sliced turkey.  2 tsp mayonnaise.  1 cup blueberries.  1 cup snap peas. Snack  6 whole-wheat crackers.  1 oz string cheese. Dinner   cup brown rice.  1 cup mixed veggies.  1 tsp olive oil.  3 oz grilled fish. Document Released: 03/02/2005 Document Revised: 05/25/2011 Document Reviewed: 01/16/2011 [employer] Patient Information 2014 [employer]  Gastroesophageal Reflux Disease, Adult Gastroesophageal reflux disease (GERD) happens when acid from your stomach flows up into the esophagus. When acid comes in contact with the esophagus, the acid causes soreness (inflammation) in the esophagus. Over time, GERD may create small holes (ulcers) in the lining of the esophagus. CAUSES   Increased body weight. This puts pressure on the stomach, making acid rise from the stomach into the esophagus.  Smoking. This increases acid production in the stomach.  Drinking alcohol. This causes decreased pressure in the lower esophageal sphincter (valve or ring of muscle between the esophagus and stomach), allowing acid from the stomach into the esophagus.  Late evening meals and a full stomach. This increases pressure and acid production in the stomach.  A malformed lower esophageal sphincter. Sometimes, no cause is found. SYMPTOMS   Burning pain in the lower part of the mid-chest behind the breastbone and in the mid-stomach area.   This may occur twice a week or more often.  Trouble swallowing.  Sore throat.  Dry cough.  Asthma-like symptoms including chest tightness, shortness of breath, or wheezing. DIAGNOSIS  Your caregiver may be able to diagnose GERD based on your symptoms.  In some cases, X-rays and other tests may be done to check for complications or to check the condition of your stomach and esophagus. TREATMENT  Your caregiver may recommend over-the-counter or prescription medicines to help decrease acid production. Ask your caregiver before starting or adding any new medicines.  HOME CARE INSTRUCTIONS   Change the factors that you can control. Ask your caregiver for guidance concerning weight loss, quitting smoking, and alcohol consumption.  Avoid foods and drinks that make your symptoms worse, such as:  Caffeine or alcoholic drinks.  Chocolate.  Peppermint or mint flavorings.  Garlic and onions.  Spicy foods.  Citrus fruits, such as oranges, lemons, or limes.  Tomato-based foods such as sauce, chili, salsa, and pizza.  Fried and fatty foods.  Avoid lying down for the 3 hours prior to your bedtime or prior to taking a nap.  Eat small, frequent meals instead of large meals.  Wear loose-fitting clothing. Do not wear anything tight around your waist that causes pressure on your stomach.  Raise the head of your bed 6 to 8 inches with wood blocks to help you sleep. Extra pillows will not help.  Only take over-the-counter or prescription medicines for pain, discomfort, or fever as directed by your caregiver.  Do not take aspirin, ibuprofen, or other nonsteroidal anti-inflammatory drugs (NSAIDs). SEEK IMMEDIATE MEDICAL CARE IF:   You have pain in your arms, neck, jaw, teeth, or back.  Your pain increases or changes in intensity or duration.  You develop nausea, vomiting, or sweating (diaphoresis).  You develop shortness of breath, or you faint.  Your vomit is green, yellow, black, or looks like coffee grounds or blood.  Your stool is red, bloody, or black. These symptoms could be signs of other problems, such as heart disease, gastric bleeding, or esophageal bleeding. MAKE SURE YOU:   Understand these instructions.  Will watch your  condition.  Will get help right away if you are not doing well or get worse. Document Released: 12/10/2004 Document Revised: 05/25/2011 Document Reviewed: 09/19/2010 [employer] Patient Information 2014 [employer].   Hypertension As your heart beats, it forces blood through your arteries. This force is your blood pressure. If the pressure is too high, it is called hypertension (HTN) or high blood pressure. HTN is dangerous because you may have it and not know it. High blood pressure may mean that your heart has to work harder to pump blood. Your arteries may be narrow or stiff. The extra work puts you at risk for heart disease, stroke, and other problems.  Blood pressure consists of two numbers, a higher number over a lower, 110/72, for example. It is stated as "110 over 72." The ideal is below 120 for the top number (systolic) and under 80 for the bottom (diastolic). Write down your blood pressure today. You should pay close attention to your blood pressure if you have certain conditions such as:  Heart failure.  Prior heart attack.  Diabetes  Chronic kidney disease.  Prior stroke.  Multiple risk factors for heart disease. To see if you have HTN, your blood pressure should be measured while you are seated with your arm held at the level of the heart. It should be measured at least twice. A   one-time elevated blood pressure reading (especially in the Emergency Department) does not mean that you need treatment. There may be conditions in which the blood pressure is different between your right and left arms. It is important to see your caregiver soon for a recheck. Most people have essential hypertension which means that there is not a specific cause. This type of high blood pressure may be lowered by changing lifestyle factors such as:  Stress.  Smoking.  Lack of exercise.  Excessive weight.  Drug/tobacco/alcohol use.  Eating less salt. Most people do not have symptoms from high  blood pressure until it has caused damage to the body. Effective treatment can often prevent, delay or reduce that damage. TREATMENT  When a cause has been identified, treatment for high blood pressure is directed at the cause. There are a large number of medications to treat HTN. These fall into several categories, and your caregiver will help you select the medicines that are best for you. Medications may have side effects. You should review side effects with your caregiver. If your blood pressure stays high after you have made lifestyle changes or started on medicines,   Your medication(s) may need to be changed.  Other problems may need to be addressed.  Be certain you understand your prescriptions, and know how and when to take your medicine.  Be sure to follow up with your caregiver within the time frame advised (usually within two weeks) to have your blood pressure rechecked and to review your medications.  If you are taking more than one medicine to lower your blood pressure, make sure you know how and at what times they should be taken. Taking two medicines at the same time can result in blood pressure that is too low. SEEK IMMEDIATE MEDICAL CARE IF:  You develop a severe headache, blurred or changing vision, or confusion.  You have unusual weakness or numbness, or a faint feeling.  You have severe chest or abdominal pain, vomiting, or breathing problems. MAKE SURE YOU:   Understand these instructions.  Will watch your condition.  Will get help right away if you are not doing well or get worse.   Diabetes and Exercise Exercising regularly is important. It is not just about losing weight. It has many health benefits, such as:  Improving your overall fitness, flexibility, and endurance.  Increasing your bone density.  Helping with weight control.  Decreasing your body fat.  Increasing your muscle strength.  Reducing stress and tension.  Improving your overall  health. People with diabetes who exercise gain additional benefits because exercise:  Reduces appetite.  Improves the body's use of blood sugar (glucose).  Helps lower or control blood glucose.  Decreases blood pressure.  Helps control blood lipids (such as cholesterol and triglycerides).  Improves the body's use of the hormone insulin by:  Increasing the body's insulin sensitivity.  Reducing the body's insulin needs.  Decreases the risk for heart disease because exercising:  Lowers cholesterol and triglycerides levels.  Increases the levels of good cholesterol (such as high-density lipoproteins [HDL]) in the body.  Lowers blood glucose levels. YOUR ACTIVITY PLAN  Choose an activity that you enjoy and set realistic goals. Your health care provider or diabetes educator can help you make an activity plan that works for you. You can break activities into 2 or 3 sessions throughout the day. Doing so is as good as one long session. Exercise ideas include:  Taking the dog for a walk.  Taking the stairs instead   of the elevator.  Dancing to your favorite song.  Doing your favorite exercise with a friend. RECOMMENDATIONS FOR EXERCISING WITH TYPE 1 OR TYPE 2 DIABETES   Check your blood glucose before exercising. If blood glucose levels are greater than 240 mg/dL, check for urine ketones. Do not exercise if ketones are present.  Avoid injecting insulin into areas of the body that are going to be exercised. For example, avoid injecting insulin into:  The arms when playing tennis.  The legs when jogging.  Keep a record of:  Food intake before and after you exercise.  Expected peak times of insulin action.  Blood glucose levels before and after you exercise.  The type and amount of exercise you have done.  Review your records with your health care provider. Your health care provider will help you to develop guidelines for adjusting food intake and insulin amounts before and  after exercising.  If you take insulin or oral hypoglycemic agents, watch for signs and symptoms of hypoglycemia. They include:  Dizziness.  Shaking.  Sweating.  Chills.  Confusion.  Drink plenty of water while you exercise to prevent dehydration or heat stroke. Body water is lost during exercise and must be replaced.  Talk to your health care provider before starting an exercise program to make sure it is safe for you. Remember, almost any type of activity is better than none.    Cholesterol Cholesterol is a white, waxy, fat-like protein needed by your body in small amounts. The liver makes all the cholesterol you need. It is carried from the liver by the blood through the blood vessels. Deposits (plaque) may build up on blood vessel walls. This makes the arteries narrower and stiffer. Plaque increases the risk for heart attack and stroke. You cannot feel your cholesterol level even if it is very high. The only way to know is by a blood test to check your lipid (fats) levels. Once you know your cholesterol levels, you should keep a record of the test results. Work with your caregiver to to keep your levels in the desired range. WHAT THE RESULTS MEAN:  Total cholesterol is a rough measure of all the cholesterol in your blood.  LDL is the so-called bad cholesterol. This is the type that deposits cholesterol in the walls of the arteries. You want this level to be low.  HDL is the good cholesterol because it cleans the arteries and carries the LDL away. You want this level to be high.  Triglycerides are fat that the body can either burn for energy or store. High levels are closely linked to heart disease. DESIRED LEVELS:  Total cholesterol below 200.  LDL below 100 for people at risk, below 70 for very high risk.  HDL above 50 is good, above 60 is best.  Triglycerides below 150. HOW TO LOWER YOUR CHOLESTEROL:  Diet.  Choose fish or white meat chicken and turkey, roasted or  baked. Limit fatty cuts of red meat, fried foods, and processed meats, such as sausage and lunch meat.  Eat lots of fresh fruits and vegetables. Choose whole grains, beans, pasta, potatoes and cereals.  Use only small amounts of olive, corn or canola oils. Avoid butter, mayonnaise, shortening or palm kernel oils. Avoid foods with trans-fats.  Use skim/nonfat milk and low-fat/nonfat yogurt and cheeses. Avoid whole milk, cream, ice cream, egg yolks and cheeses. Healthy desserts include angel food cake, ginger snaps, animal crackers, hard candy, popsicles, and low-fat/nonfat frozen yogurt. Avoid pastries, cakes, pies   and cookies.  Exercise.  A regular program helps decrease LDL and raises HDL.  Helps with weight control.  Do things that increase your activity level like gardening, walking, or taking the stairs.  Medication.  May be prescribed by your caregiver to help lowering cholesterol and the risk for heart disease.  You may need medicine even if your levels are normal if you have several risk factors. HOME CARE INSTRUCTIONS   Follow your diet and exercise programs as suggested by your caregiver.  Take medications as directed.  Have blood work done when your caregiver feels it is necessary. MAKE SURE YOU:   Understand these instructions.  Will watch your condition.  Will get help right away if you are not doing well or get worse.      Vitamin D Deficiency Vitamin D is an important vitamin that your body needs. Having too little of it in your body is called a deficiency. A very bad deficiency can make your bones soft and can cause a condition called rickets.  Vitamin D is important to your body for different reasons, such as:   It helps your body absorb 2 minerals called calcium and phosphorus.  It helps make your bones healthy.  It may prevent some diseases, such as diabetes and multiple sclerosis.  It helps your muscles and heart. You can get vitamin D in several  ways. It is a natural part of some foods. The vitamin is also added to some dairy products and cereals. Some people take vitamin D supplements. Also, your body makes vitamin D when you are in the sun. It changes the sun's rays into a form of the vitamin that your body can use. CAUSES   Not eating enough foods that contain vitamin D.  Not getting enough sunlight.  Having certain digestive system diseases that make it hard to absorb vitamin D. These diseases include Crohn's disease, chronic pancreatitis, and cystic fibrosis.  Having a surgery in which part of the stomach or small intestine is removed.  Being obese. Fat cells pull vitamin D out of your blood. That means that obese people may not have enough vitamin D left in their blood and in other body tissues.  Having chronic kidney or liver disease. RISK FACTORS Risk factors are things that make you more likely to develop a vitamin D deficiency. They include:  Being older.  Not being able to get outside very much.  Living in a nursing home.  Having had broken bones.  Having weak or thin bones (osteoporosis).  Having a disease or condition that changes how your body absorbs vitamin D.  Having dark skin.  Some medicines such as seizure medicines or steroids.  Being overweight or obese. SYMPTOMS Mild cases of vitamin D deficiency may not have any symptoms. If you have a very bad case, symptoms may include:  Bone pain.  Muscle pain.  Falling often.  Broken bones caused by a minor injury, due to osteoporosis. DIAGNOSIS A blood test is the best way to tell if you have a vitamin D deficiency. TREATMENT Vitamin D deficiency can be treated in different ways. Treatment for vitamin D deficiency depends on what is causing it. Options include:  Taking vitamin D supplements.  Taking a calcium supplement. Your caregiver will suggest what dose is best for you. HOME CARE INSTRUCTIONS  Take any supplements that your caregiver  prescribes. Follow the directions carefully. Take only the suggested amount.  Have your blood tested 2 months after you start taking   supplements.  Eat foods that contain vitamin D. Healthy choices include:  Fortified dairy products, cereals, or juices. Fortified means vitamin D has been added to the food. Check the label on the package to be sure.  Fatty fish like salmon or trout.  Eggs.  Oysters.  Do not use a tanning bed.  Keep your weight at a healthy level. Lose weight if you need to.  Keep all follow-up appointments. Your caregiver will need to perform blood tests to make sure your vitamin D deficiency is going away. SEEK MEDICAL CARE IF:  You have any questions about your treatment.  You continue to have symptoms of vitamin D deficiency.  You have nausea or vomiting.  You are constipated.  You feel confused.  You have severe abdominal or back pain. MAKE SURE YOU:  Understand these instructions.  Will watch your condition.  Will get help right away if you are not doing well or get worse.   

## 2013-07-14 LAB — INSULIN, FASTING: Insulin fasting, serum: 247 u[IU]/mL — ABNORMAL HIGH (ref 3–28)

## 2013-07-14 LAB — URINALYSIS, MICROSCOPIC ONLY
Bacteria, UA: NONE SEEN
Casts: NONE SEEN
Crystals: NONE SEEN
Squamous Epithelial / LPF: NONE SEEN

## 2013-07-14 LAB — HEPATIC FUNCTION PANEL
ALT: 21 U/L (ref 0–53)
AST: 19 U/L (ref 0–37)
Albumin: 4.1 g/dL (ref 3.5–5.2)
Alkaline Phosphatase: 60 U/L (ref 39–117)
Bilirubin, Direct: 0.1 mg/dL (ref 0.0–0.3)
Indirect Bilirubin: 0.4 mg/dL (ref 0.2–1.2)
Total Bilirubin: 0.5 mg/dL (ref 0.2–1.2)
Total Protein: 6.4 g/dL (ref 6.0–8.3)

## 2013-07-14 LAB — MAGNESIUM: Magnesium: 1.8 mg/dL (ref 1.5–2.5)

## 2013-07-14 LAB — BASIC METABOLIC PANEL WITH GFR
BUN: 17 mg/dL (ref 6–23)
CO2: 27 mEq/L (ref 19–32)
Calcium: 9.5 mg/dL (ref 8.4–10.5)
Chloride: 102 mEq/L (ref 96–112)
Creat: 1.04 mg/dL (ref 0.50–1.35)
GFR, Est African American: 79 mL/min
GFR, Est Non African American: 68 mL/min
Glucose, Bld: 109 mg/dL — ABNORMAL HIGH (ref 70–99)
Potassium: 4 mEq/L (ref 3.5–5.3)
Sodium: 141 mEq/L (ref 135–145)

## 2013-07-14 LAB — LIPID PANEL
Cholesterol: 175 mg/dL (ref 0–200)
HDL: 35 mg/dL — ABNORMAL LOW (ref >39)
LDL Cholesterol: 96 mg/dL (ref 0–99)
Total CHOL/HDL Ratio: 5 Ratio
Triglycerides: 220 mg/dL — ABNORMAL HIGH (ref <150)
VLDL: 44 mg/dL — ABNORMAL HIGH (ref 0–40)

## 2013-07-14 LAB — TSH: TSH: 1.059 u[IU]/mL (ref 0.350–4.500)

## 2013-07-14 LAB — MICROALBUMIN / CREATININE URINE RATIO
Creatinine, Urine: 53.4 mg/dL
Microalb Creat Ratio: 9.4 mg/g (ref 0.0–30.0)
Microalb, Ur: 0.5 mg/dL (ref 0.00–1.89)

## 2013-07-14 LAB — VITAMIN D 25 HYDROXY (VIT D DEFICIENCY, FRACTURES): Vit D, 25-Hydroxy: 75 ng/mL (ref 30–89)

## 2013-07-19 ENCOUNTER — Other Ambulatory Visit (INDEPENDENT_AMBULATORY_CARE_PROVIDER_SITE_OTHER): Payer: Medicare Other | Admitting: *Deleted

## 2013-07-19 DIAGNOSIS — Z1212 Encounter for screening for malignant neoplasm of rectum: Secondary | ICD-10-CM | POA: Diagnosis not present

## 2013-07-19 LAB — POC HEMOCCULT BLD/STL (HOME/3-CARD/SCREEN)
Card #2 Fecal Occult Blod, POC: NEGATIVE
Card #3 Fecal Occult Blood, POC: NEGATIVE
Fecal Occult Blood, POC: NEGATIVE

## 2013-09-19 DIAGNOSIS — C61 Malignant neoplasm of prostate: Secondary | ICD-10-CM | POA: Diagnosis not present

## 2013-09-26 DIAGNOSIS — C61 Malignant neoplasm of prostate: Secondary | ICD-10-CM | POA: Diagnosis not present

## 2013-09-26 DIAGNOSIS — N139 Obstructive and reflux uropathy, unspecified: Secondary | ICD-10-CM | POA: Diagnosis not present

## 2013-09-26 DIAGNOSIS — N401 Enlarged prostate with lower urinary tract symptoms: Secondary | ICD-10-CM | POA: Diagnosis not present

## 2013-10-12 ENCOUNTER — Encounter: Payer: Self-pay | Admitting: Physician Assistant

## 2013-10-12 ENCOUNTER — Ambulatory Visit (INDEPENDENT_AMBULATORY_CARE_PROVIDER_SITE_OTHER): Payer: Medicare Other | Admitting: Physician Assistant

## 2013-10-12 VITALS — BP 130/78 | HR 64 | Temp 98.1°F | Resp 16 | Ht 66.0 in | Wt 138.0 lb

## 2013-10-12 DIAGNOSIS — Z79899 Other long term (current) drug therapy: Secondary | ICD-10-CM | POA: Diagnosis not present

## 2013-10-12 DIAGNOSIS — Z1331 Encounter for screening for depression: Secondary | ICD-10-CM

## 2013-10-12 DIAGNOSIS — E782 Mixed hyperlipidemia: Secondary | ICD-10-CM

## 2013-10-12 DIAGNOSIS — Z23 Encounter for immunization: Secondary | ICD-10-CM | POA: Diagnosis not present

## 2013-10-12 DIAGNOSIS — E559 Vitamin D deficiency, unspecified: Secondary | ICD-10-CM

## 2013-10-12 DIAGNOSIS — R7309 Other abnormal glucose: Secondary | ICD-10-CM | POA: Diagnosis not present

## 2013-10-12 DIAGNOSIS — Z0000 Encounter for general adult medical examination without abnormal findings: Secondary | ICD-10-CM

## 2013-10-12 DIAGNOSIS — I10 Essential (primary) hypertension: Secondary | ICD-10-CM | POA: Diagnosis not present

## 2013-10-12 DIAGNOSIS — Z789 Other specified health status: Secondary | ICD-10-CM

## 2013-10-12 LAB — CBC WITH DIFFERENTIAL/PLATELET
Basophils Absolute: 0 10*3/uL (ref 0.0–0.1)
Basophils Relative: 0 % (ref 0–1)
Eosinophils Absolute: 0.1 10*3/uL (ref 0.0–0.7)
Eosinophils Relative: 1 % (ref 0–5)
HCT: 44.7 % (ref 39.0–52.0)
Hemoglobin: 15.5 g/dL (ref 13.0–17.0)
Lymphocytes Relative: 24 % (ref 12–46)
Lymphs Abs: 1.2 10*3/uL (ref 0.7–4.0)
MCH: 30.5 pg (ref 26.0–34.0)
MCHC: 34.7 g/dL (ref 30.0–36.0)
MCV: 88 fL (ref 78.0–100.0)
Monocytes Absolute: 0.6 10*3/uL (ref 0.1–1.0)
Monocytes Relative: 11 % (ref 3–12)
Neutro Abs: 3.3 10*3/uL (ref 1.7–7.7)
Neutrophils Relative %: 64 % (ref 43–77)
Platelets: 150 10*3/uL (ref 150–400)
RBC: 5.08 MIL/uL (ref 4.22–5.81)
RDW: 13.3 % (ref 11.5–15.5)
WBC: 5.1 10*3/uL (ref 4.0–10.5)

## 2013-10-12 LAB — HEMOGLOBIN A1C
Hgb A1c MFr Bld: 5.9 % — ABNORMAL HIGH (ref <5.7)
Mean Plasma Glucose: 123 mg/dL — ABNORMAL HIGH (ref <117)

## 2013-10-12 NOTE — Patient Instructions (Signed)
Preventative Care for Adults, Male       REGULAR HEALTH EXAMS:  A routine yearly physical is a good way to check in with your primary care provider about your health and preventive screening. It is also an opportunity to share updates about your health and any concerns you have, and receive a thorough all-over exam.   Most health insurance companies pay for at least some preventative services.  Check with your health plan for specific coverages.  WHAT PREVENTATIVE SERVICES DO MEN NEED?  Adult men should have their weight and blood pressure checked regularly.   Men age 3 and older should have their cholesterol levels checked regularly.  Beginning at age 79 and continuing to age 38, men should be screened for colorectal cancer.  Certain people should may need continued testing until age 84.  Other cancer screening may include exams for testicular and prostate cancer.  Updating vaccinations is part of preventative care.  Vaccinations help protect against diseases such as the flu.  Lab tests are generally done as part of preventative care to screen for anemia and blood disorders, to screen for problems with the kidneys and liver, to screen for bladder problems, to check blood sugar, and to check your cholesterol level.  Preventative services generally include counseling about diet, exercise, avoiding tobacco, drugs, excessive alcohol consumption, and sexually transmitted infections.    GENERAL RECOMMENDATIONS FOR GOOD HEALTH:  Healthy diet:  Eat a variety of foods, including fruit, vegetables, animal or vegetable protein, such as meat, fish, chicken, and eggs, or beans, lentils, tofu, and grains, such as rice.  Drink plenty of water daily.  Decrease saturated fat in the diet, avoid lots of red meat, processed foods, sweets, fast foods, and fried foods.  Exercise:  Aerobic exercise helps maintain good heart health. At least 30-40 minutes of moderate-intensity exercise is recommended.  For example, a brisk walk that increases your heart rate and breathing. This should be done on most days of the week.   Find a type of exercise or a variety of exercises that you enjoy so that it becomes a part of your daily life.  Examples are running, walking, swimming, water aerobics, and biking.  For motivation and support, explore group exercise such as aerobic class, spin class, Zumba, Yoga,or  martial arts, etc.    Set exercise goals for yourself, such as a certain weight goal, walk or run in a race such as a 5k walk/run.  Speak to your primary care provider about exercise goals.  Disease prevention:  If you smoke or chew tobacco, find out from your caregiver how to quit. It can literally save your life, no matter how long you have been a tobacco user. If you do not use tobacco, never begin.   Maintain a healthy diet and normal weight. Increased weight leads to problems with blood pressure and diabetes.   The Body Mass Index or BMI is a way of measuring how much of your body is fat. Having a BMI above 27 increases the risk of heart disease, diabetes, hypertension, stroke and other problems related to obesity. Your caregiver can help determine your BMI and based on it develop an exercise and dietary program to help you achieve or maintain this important measurement at a healthful level.  High blood pressure causes heart and blood vessel problems.  Persistent high blood pressure should be treated with medicine if weight loss and exercise do not work.   Fat and cholesterol leaves deposits in your arteries  that can block them. This causes heart disease and vessel disease elsewhere in your body.  If your cholesterol is found to be high, or if you have heart disease or certain other medical conditions, then you may need to have your cholesterol monitored frequently and be treated with medication.   Ask if you should have a stress test if your history suggests this. A stress test is a test done on  a treadmill that looks for heart disease. This test can find disease prior to there being a problem.  Avoid drinking alcohol in excess (more than two drinks per day).  Avoid use of street drugs. Do not share needles with anyone. Ask for professional help if you need assistance or instructions on stopping the use of alcohol, cigarettes, and/or drugs.  Brush your teeth twice a day with fluoride toothpaste, and floss once a day. Good oral hygiene prevents tooth decay and gum disease. The problems can be painful, unattractive, and can cause other health problems. Visit your dentist for a routine oral and dental check up and preventive care every 6-12 months.   Look at your skin regularly.  Use a mirror to look at your back. Notify your caregivers of changes in moles, especially if there are changes in shapes, colors, a size larger than a pencil eraser, an irregular border, or development of new moles.  Safety:  Use seatbelts 100% of the time, whether driving or as a passenger.  Use safety devices such as hearing protection if you work in environments with loud noise or significant background noise.  Use safety glasses when doing any work that could send debris in to the eyes.  Use a helmet if you ride a bike or motorcycle.  Use appropriate safety gear for contact sports.  Talk to your caregiver about gun safety.  Use sunscreen with a SPF (or skin protection factor) of 15 or greater.  Lighter skinned people are at a greater risk of skin cancer. Don't forget to also wear sunglasses in order to protect your eyes from too much damaging sunlight. Damaging sunlight can accelerate cataract formation.   Practice safe sex. Use condoms. Condoms are used for birth control and to help reduce the spread of sexually transmitted infections (or STIs).  Some of the STIs are gonorrhea (the clap), chlamydia, syphilis, trichomonas, herpes, HPV (human papilloma virus) and HIV (human immunodeficiency virus) which causes AIDS.  The herpes, HIV and HPV are viral illnesses that have no cure. These can result in disability, cancer and death.   Keep carbon monoxide and smoke detectors in your home functioning at all times. Change the batteries every 6 months or use a model that plugs into the wall.   Vaccinations:  Stay up to date with your tetanus shots and other required immunizations. You should have a booster for tetanus every 10 years. Be sure to get your flu shot every year, since 5%-20% of the [place] population comes down with the flu. The flu vaccine changes each year, so being vaccinated once is not enough. Get your shot in the fall, before the flu season peaks.   Other vaccines to consider:  Pneumococcal vaccine to protect against certain types of pneumonia.  This is normally recommended for adults age 42 or older.  However, adults younger than 78 years old with certain underlying conditions such as diabetes, heart or lung disease should also receive the vaccine.  Shingles vaccine to protect against Varicella Zoster if you are older than age 64, or younger  than 78 years old with certain underlying illness.  Hepatitis A vaccine to protect against a form of infection of the liver by a virus acquired from food.  Hepatitis B vaccine to protect against a form of infection of the liver by a virus acquired from blood or body fluids, particularly if you work in health care.  If you plan to travel internationally, check with your local health department for specific vaccination recommendations.  Cancer Screening:  Most routine colon cancer screening begins at the age of 80. On a yearly basis, doctors may provide special easy to use take-home tests to check for hidden blood in the stool. Sigmoidoscopy or colonoscopy can detect the earliest forms of colon cancer and is life saving. These tests use a small camera at the end of a tube to directly examine the colon. Speak to your caregiver about this at age 33, when routine  screening begins (and is repeated every 5 years unless early forms of pre-cancerous polyps or small growths are found).   At the age of 25 men usually start screening for prostate cancer every year. Screening may begin at a younger age for those with higher risk. Those at higher risk include African-Americans or having a family history of prostate cancer. There are two types of tests for prostate cancer:   Prostate-specific antigen (PSA) testing. Recent studies raise questions about prostate cancer using PSA and you should discuss this with your caregiver.   Digital rectal exam (in which your doctor's lubricated and gloved finger feels for enlargement of the prostate through the anus).   Screening for testicular cancer.  Do a monthly exam of your testicles. Gently roll each testicle between your thumb and fingers, feeling for any abnormal lumps. The best time to do this is after a hot shower or bath when the tissues are looser. Notify your caregivers of any lumps, tenderness or changes in size or shape immediately.    Cologuard is an easy to use noninvasive colon cancer screening test based on the latest advances in stool DNA science.   Colon cancer is 3rd most diagnosed cancer and 2nd leading cause of death in both men and women 63 years of age and older despite being one of the most preventable and treatable cancers if found early. You have agreed to do a Cologuard screening and have declined a colonoscopy in spite of being explained the risks and benefits of the colonoscopy in detail, including cancer and death. Please understand that this is test not as sensitive or specific as a colonoscopy and you are still recommended to get a colonoscopy.   If you are NOT medicare please call your insurance company and given them this CPT code, [phone], in order to see how much your insurance company will cover.   You will receive a short call from [employer] Customer support center at [employer], when you receive a call they will say they are from [employer],  to confirm your mailing address and give you more information.  When they calll you, it will appear on the caller ID as "[employer]" or in some cases only this number will appear, [phone].   [employer] will ship your collection kit directly to you. You will collect a single stool sample in the privacy of your own home, no special preparation required. You will return the kit via UPS pre-paid shipping or pick-up, in the same box it arrived in. Then I will contact you to discuss your  results after I receive them from the laboratory.   If you have any questions or concerns, [employer] Customer Support Specialist are available 24 hours a day, 7 days a week at [phone] or go to [email or web address].

## 2013-10-12 NOTE — Progress Notes (Signed)
MEDICARE ANNUAL WELLNESS VISIT AND FOLLOW UP Assessment:   1. HYPERTENSION - CBC with Differential - BASIC METABOLIC PANEL WITH GFR - Hepatic function panel - TSH  2. Encounter for long-term (current) use of other medications - Magnesium  3. Hyperlipidemia - Lipid panel  4. PreDiabetes Discussed general issues about diabetes pathophysiology and management., Educational material distributed., Suggested low cholesterol diet., Encouraged aerobic exercise., Discussed foot care., Reminded to get yearly retinal exam. - Hemoglobin A1c - Insulin, fasting - HM DIABETES FOOT EXAM  5. Vitamin D Deficiency  6. Colonoscopy- patient declines a colonoscopy even though the risks and benefits were discussed at length. Colon cancer is 3rd most diagnosed cancer and 2nd leading cause of death in both men and women 61 years of age and older. Patient understands the risk of cancer and death with declining the test however he will consider doing cologuard    Plan:   During the course of the visit the patient was educated and counseled about appropriate screening and preventive services including:    Pneumococcal vaccine   Influenza vaccine  Td vaccine  Screening electrocardiogram  Colorectal cancer screening  Diabetes screening  Glaucoma screening  Nutrition counseling   Smoking cessation counseling  Screening recommendations, referrals: Vaccinations: Tdap vaccine needs at next visit Influenza vaccine requested Pneumococcal vaccine got prevnar today Shingles vaccine not indicated Hep B vaccine not indicated  Nutrition assessed and recommended  Colonoscopy does not want another, given information about cologaurd Recommended yearly ophthalmology/optometry visit for glaucoma screening and checkup Recommended yearly dental visit for hygiene and checkup Advanced directives - requested  Conditions/risks identified: BMI: Discussed weight loss, diet, and increase physical activity.   Increase physical activity: AHA recommends 150 minutes of physical activity a week.  Medications reviewed Diabetes is at goal, ACE/ARB therapy: Yes. Urinary Incontinence is not an issue: discussed non pharmacology and pharmacology options.  Fall risk: low- discussed PT, home fall assessment, medications.    Subjective:  Matthew Aguirre is a 80 y.o. male who presents for Medicare Annual Wellness Visit and 3 month follow up for HTN, hyperlipidemia, prediabetes, and vitamin D Def.  Date of last medicare wellness visit was is unknown.  His blood pressure has been controlled at home, today their BP is BP: 130/78 mmHg He does workout, walks a mile every morning.  He denies chest pain, shortness of breath, dizziness.  He is not on cholesterol medication and denies myalgias. His cholesterol is at goal. The cholesterol last visit was:   Lab Results  Component Value Date   CHOL 175 07/13/2013   HDL 35* 07/13/2013   LDLCALC 96 07/13/2013   TRIG 220* 07/13/2013   CHOLHDL 5.0 07/13/2013   Last A1C in the office was:  Lab Results  Component Value Date   HGBA1C 5.6 07/13/2013   Patient is on Vitamin D supplement.   Lab Results  Component Value Date   VD25OH 47 07/13/2013     He has had some stress, his wife has had A flutter and recent negative cath. He has been helping/taking care of her.   Names of Other Physician/Practitioners you currently use: 1. [facility] here for primary care 2. [facility], eye doctor, last visit 9 months ago 3. None, dentist, last visit years Patient Care Team: Unk Pinto, MD as PCP - General (Internal Medicine) Festus Aloe, MD as Consulting Physician (Urology) Ponciano Ort, MD as Referring Physician (Urology) Inda Castle, MD as Consulting Physician (Gastroenterology)  Medication Review:  Current Outpatient Prescriptions on File Prior to Visit  Medication Sig Dispense Refill  . atenolol (TENORMIN) 50  MG tablet Take 50 mg by mouth daily.      Marland Kitchen BABY ASPIRIN PO Take 81 mg by mouth daily.      . Cholecalciferol (VITAMIN D PO) Take 2,000 Int'l Units by mouth 2 (two) times daily.       . finasteride (PROSCAR) 5 MG tablet Take 5 mg by mouth daily.      . Flaxseed, Linseed, (FLAX SEED OIL PO) Take 1,200 mg by mouth 3 (three) times daily.       . Magnesium 250 MG TABS Take 250 mg by mouth daily.      . Omega-3 Fatty Acids (FISH OIL PO) Take 1,000 mg by mouth 2 (two) times daily.       . ranitidine (ZANTAC) 300 MG tablet Take 1 tablet (300 mg total) by mouth at bedtime. For Heart Burn  90 tablet  1   No current facility-administered medications on file prior to visit.    Current Problems (verified) Patient Active Problem List   Diagnosis Date Noted  . Hyperlipidemia 07/13/2013  . PreDiabetes 07/13/2013  . Vitamin D Deficiency 07/13/2013  . Encounter for long-term (current) use of other medications 07/13/2013  . HYPERTENSION 02/15/2007  . PULMONARY NODULE 02/15/2007    Screening Tests Health Maintenance  Topic Date Due  . Tetanus/tdap  03/29/1952  . Zostavax  03/30/1995  . Pneumococcal Polysaccharide Vaccine Age 73 And Over  03/29/2000  . Colonoscopy  03/21/2012  . Influenza Vaccine  10/14/2013   Preventative care: Last colonoscopy: 2004, he is due but does not want another, discussed cologuard  Prior vaccinations: TD or Tdap: 2005 due  Influenza: 2014 Pneumococcal: 2011 Shingles/Zostavax: declines  History reviewed: allergies, current medications, past family history, past medical history, past social history, past surgical history and problem list  Risk Factors: Tobacco History  Substance Use Topics  . Smoking status: Never Smoker   . Smokeless tobacco: Not on file  . Alcohol Use: No   He does not smoke.  Patient is not a former smoker. Are there smokers in your home (other than you)?  No  Alcohol Current alcohol use: none  Caffeine Current caffeine use: coffee 1  /day  Exercise Current exercise: walking  Nutrition/Diet Current diet: in general, a "healthy" diet    Cardiac risk factors: advanced age (older than 48 for men, 23 for women), dyslipidemia, hypertension, male gender and sedentary lifestyle.  Depression Screen (Note: if answer to either of the following is "Yes", a more complete depression screening is indicated)   Q1: Over the past two weeks, have you felt down, depressed or hopeless? No  Q2: Over the past two weeks, have you felt little interest or pleasure in doing things? No  Have you lost interest or pleasure in daily life? No  Do you often feel hopeless? No  Do you cry easily over simple problems? No  Activities of Daily Living In your present state of health, do you have any difficulty performing the following activities?:  Driving? No Managing money?  No Feeding yourself? No Getting from bed to chair? No Climbing a flight of stairs? No Preparing food and eating?: No Bathing or showering? No Getting dressed: No Getting to the toilet? No Using the toilet:No Moving around from place to place: No In the past year have you fallen or had a near fall?:No   Are you sexually active?  No  Do you have more than one partner?  No  Vision Difficulties: No  Hearing Difficulties: No Do you often ask people to speak up or repeat themselves? No Do you experience ringing or noises in your ears? Yes Do you have difficulty understanding soft or whispered voices? No  Cognition  Do you feel that you have a problem with memory?No  Do you often misplace items? No  Do you feel safe at home?  Yes  Advanced directives Does patient have a [place]? Yes Does patient have a Living Will? Yes   Objective:   Blood pressure 130/78, pulse 64, temperature 98.1 F (36.7 C), resp. rate 16, height 5\' 6"  (1.676 m), weight 138 lb (62.596 kg). Body mass index is 22.28 kg/(m^2).  General appearance: alert, no distress,  WD/WN, male Cognitive Testing  Alert? Yes  Normal Appearance?Yes  Oriented to person? Yes  Place? Yes   Time? Yes  Recall of three objects?  Yes  Can perform simple calculations? Yes  Displays appropriate judgment?Yes  Can read the correct time from a watch face?Yes  HEENT: normocephalic, sclerae anicteric, TMs pearly, nares patent, no discharge or erythema, pharynx normal Oral cavity: MMM, no lesions Neck: supple, no lymphadenopathy, no thyromegaly, no masses Heart: RRR, normal S1, S2, no murmurs Lungs: CTA bilaterally, no wheezes, rhonchi, or rales Abdomen: +bs, soft, non tender, non distended, no masses, no hepatomegaly, no splenomegaly Musculoskeletal: nontender, no swelling, no obvious deformity Extremities: no edema, no cyanosis, no clubbing Pulses: 2+ symmetric, upper and lower extremities, normal cap refill Neurological: alert, oriented x 3, CN2-12 intact, strength normal upper extremities and lower extremities, sensation normal throughout, DTRs 2+ throughout, no cerebellar signs, gait normal Psychiatric: normal affect, behavior normal, pleasant   Medicare Attestation I have personally reviewed: The patient's medical and social history Their use of alcohol, tobacco or illicit drugs Their current medications and supplements The patient's functional ability including ADLs,fall risks, home safety risks, cognitive, and hearing and visual impairment Diet and physical activities Evidence for depression or mood disorders  The patient's weight, height, BMI, and visual acuity have been recorded in the chart.  I have made referrals, counseling, and provided education to the patient based on review of the above and I have provided the patient with a written personalized care plan for preventive services.     Vicie Mutters, PA-C   10/12/2013

## 2013-10-13 LAB — BASIC METABOLIC PANEL WITH GFR
BUN: 16 mg/dL (ref 6–23)
CO2: 22 mEq/L (ref 19–32)
Calcium: 9.5 mg/dL (ref 8.4–10.5)
Chloride: 105 mEq/L (ref 96–112)
Creat: 0.87 mg/dL (ref 0.50–1.35)
GFR, Est African American: >89 mL/min
GFR, Est Non African American: 83 mL/min
Glucose, Bld: 72 mg/dL (ref 70–99)
Potassium: 4.4 mEq/L (ref 3.5–5.3)
Sodium: 140 mEq/L (ref 135–145)

## 2013-10-13 LAB — LIPID PANEL
Cholesterol: 164 mg/dL (ref 0–200)
HDL: 39 mg/dL — ABNORMAL LOW (ref >39)
LDL Cholesterol: 99 mg/dL (ref 0–99)
Total CHOL/HDL Ratio: 4.2 Ratio
Triglycerides: 131 mg/dL (ref <150)
VLDL: 26 mg/dL (ref 0–40)

## 2013-10-13 LAB — HEPATIC FUNCTION PANEL
ALT: 16 U/L (ref 0–53)
AST: 17 U/L (ref 0–37)
Albumin: 4.2 g/dL (ref 3.5–5.2)
Alkaline Phosphatase: 52 U/L (ref 39–117)
Bilirubin, Direct: 0.1 mg/dL (ref 0.0–0.3)
Indirect Bilirubin: 0.6 mg/dL (ref 0.2–1.2)
Total Bilirubin: 0.7 mg/dL (ref 0.2–1.2)
Total Protein: 6.6 g/dL (ref 6.0–8.3)

## 2013-10-13 LAB — MAGNESIUM: Magnesium: 2.1 mg/dL (ref 1.5–2.5)

## 2013-10-13 LAB — INSULIN, FASTING: Insulin fasting, serum: 10 u[IU]/mL (ref 3–28)

## 2013-10-13 LAB — TSH: TSH: 1.007 u[IU]/mL (ref 0.350–4.500)

## 2013-12-14 DIAGNOSIS — Z961 Presence of intraocular lens: Secondary | ICD-10-CM | POA: Diagnosis not present

## 2014-01-05 ENCOUNTER — Other Ambulatory Visit: Payer: Self-pay | Admitting: Internal Medicine

## 2014-01-15 ENCOUNTER — Ambulatory Visit (INDEPENDENT_AMBULATORY_CARE_PROVIDER_SITE_OTHER): Payer: Medicare Other | Admitting: Internal Medicine

## 2014-01-15 ENCOUNTER — Encounter: Payer: Self-pay | Admitting: Internal Medicine

## 2014-01-15 VITALS — BP 140/72 | HR 56 | Temp 97.9°F | Resp 16 | Ht 66.25 in | Wt 134.8 lb

## 2014-01-15 DIAGNOSIS — I10 Essential (primary) hypertension: Secondary | ICD-10-CM | POA: Diagnosis not present

## 2014-01-15 DIAGNOSIS — Z79899 Other long term (current) drug therapy: Secondary | ICD-10-CM | POA: Diagnosis not present

## 2014-01-15 DIAGNOSIS — E782 Mixed hyperlipidemia: Secondary | ICD-10-CM

## 2014-01-15 DIAGNOSIS — R7303 Prediabetes: Secondary | ICD-10-CM

## 2014-01-15 DIAGNOSIS — E559 Vitamin D deficiency, unspecified: Secondary | ICD-10-CM | POA: Diagnosis not present

## 2014-01-15 DIAGNOSIS — R7309 Other abnormal glucose: Secondary | ICD-10-CM | POA: Diagnosis not present

## 2014-01-15 LAB — CBC WITH DIFFERENTIAL/PLATELET
Basophils Absolute: 0 10*3/uL (ref 0.0–0.1)
Basophils Relative: 0 % (ref 0–1)
Eosinophils Absolute: 0 10*3/uL (ref 0.0–0.7)
Eosinophils Relative: 0 % (ref 0–5)
HCT: 45.7 % (ref 39.0–52.0)
Hemoglobin: 15.5 g/dL (ref 13.0–17.0)
Lymphocytes Relative: 25 % (ref 12–46)
Lymphs Abs: 1.1 10*3/uL (ref 0.7–4.0)
MCH: 30.2 pg (ref 26.0–34.0)
MCHC: 33.9 g/dL (ref 30.0–36.0)
MCV: 89.1 fL (ref 78.0–100.0)
Monocytes Absolute: 0.4 10*3/uL (ref 0.1–1.0)
Monocytes Relative: 8 % (ref 3–12)
Neutro Abs: 3 10*3/uL (ref 1.7–7.7)
Neutrophils Relative %: 67 % (ref 43–77)
Platelets: 149 10*3/uL — ABNORMAL LOW (ref 150–400)
RBC: 5.13 MIL/uL (ref 4.22–5.81)
RDW: 12.9 % (ref 11.5–15.5)
WBC: 4.5 10*3/uL (ref 4.0–10.5)

## 2014-01-15 NOTE — Progress Notes (Signed)
Patient ID: Matthew Aguirre, male   DOB: 06-15-1933, 80 y.o.   MRN: [id number]   This very nice 80 y.o.male presents for 3 month follow up with Hypertension, Hyperlipidemia, Pre-Diabetes and Vitamin D Deficiency.    Patient is treated for HTN & BP has been controlled at home. Today's BP: 140/72 mmHg. Patient has had no complaints of any cardiac type chest pain, palpitations, dyspnea/orthopnea/PND, dizziness, claudication, or dependent edema.   Hyperlipidemia is controlled with diet& supplements with both flax see and fish oil. Patient denies myalgias or other med SE's. Last Lipids were at goal - Total Chol 164; HDL  39*; LDL  99; Trig 131 on 10/12/2013.   Also, the patient has history of PreDiabetes and has had no symptoms of reactive hypoglycemia, diabetic polys, paresthesias or visual blurring.  Last A1c was  5.9% on 10/12/2013.   Further, the patient also has history of Vitamin D Deficiency (41 in 2008) and supplements vitamin D without any suspected side-effects. Last vitamin D was 75 on 07/13/2013.   Medication List   BABY ASPIRIN PO  Take 81 mg by mouth daily.     finasteride 5 MG tablet  Commonly known as:  PROSCAR  Take 5 mg by mouth daily.     FISH OIL PO  Take 1,000 mg by mouth 2 (two) times daily.     FLAX SEED OIL PO  Take 1,200 mg by mouth 3 (three) times daily.     Magnesium 250 MG Tabs  Take 250 mg by mouth daily.     ranitidine 300 MG tablet  Commonly known as:  ZANTAC  Take 1 tablet (300 mg total) by mouth at bedtime. For Heart Burn     VITAMIN D PO  Take 2,000 Int'l Units by mouth 2 (two) times daily.     Allergies  Allergen Reactions  . Pravastatin   . Red Yeast Rice [Cholestin]   . Sulfa Antibiotics   . Vibramycin [Doxycycline Calcium]   . Zetia [Ezetimibe]    PMHx:   Past Medical History  Diagnosis Date  . Vitamin D deficiency 07/13/2013  . Prediabetes 07/13/2013  . Hyperlipidemia 07/13/2013  . Essential hypertension 02/15/2007     Immunization  History  Administered Date(s) Administered  . Pneumococcal Conjugate-13 10/12/2013   FHx:    Reviewed / unchanged  SHx:    Reviewed / unchanged  Systems Review:  Constitutional: Denies fever, chills, wt changes, headaches, insomnia, fatigue, night sweats, change in appetite. Eyes: Denies redness, blurred vision, diplopia, discharge, itchy, watery eyes.  ENT: Denies discharge, congestion, post nasal drip, epistaxis, sore throat, earache, hearing loss, dental pain, tinnitus, vertigo, sinus pain, snoring.  CV: Denies chest pain, palpitations, irregular heartbeat, syncope, dyspnea, diaphoresis, orthopnea, PND, claudication or edema. Respiratory: denies cough, dyspnea, DOE, pleurisy, hoarseness, laryngitis, wheezing.  Gastrointestinal: Denies dysphagia, odynophagia, heartburn, reflux, water brash, abdominal pain or cramps, nausea, vomiting, bloating, diarrhea, constipation, hematemesis, melena, hematochezia  or hemorrhoids. Genitourinary: Denies dysuria, frequency, urgency, nocturia, hesitancy, discharge, hematuria or flank pain. Musculoskeletal: Denies arthralgias, myalgias, stiffness, jt. swelling, pain, limping or strain/sprain.  Skin: Denies pruritus, rash, hives, warts, acne, eczema or change in skin lesion(s). Neuro: No weakness, tremor, incoordination, spasms, paresthesia or pain. Psychiatric: Denies confusion, memory loss or sensory loss. Endo: Denies change in weight, skin or hair change.  Heme/Lymph: No excessive bleeding, bruising or enlarged lymph nodes.  Exam:  BP 140/72 mmHg  Pulse 56  Temp(Src) 97.9 F (36.6 C)  Resp 16  Ht 5' 6.25" (  1.683 m)  Wt 134 lb 12.8 oz (61.145 kg)  BMI 21.59 kg/m2  Appears well nourished and in no distress. Eyes: PERRLA, EOMs, conjunctiva no swelling or erythema. Sinuses: No frontal/maxillary tenderness ENT/Mouth: EAC's clear, TM's nl w/o erythema, bulging. Nares clear w/o erythema, swelling, exudates. Oropharynx clear without erythema or  exudates. Oral hygiene is good. Tongue normal, non obstructing. Hearing intact.  Neck: Supple. Thyroid nl. Car 2+/2+ without bruits, nodes or JVD. Chest: Respirations nl with BS clear & equal w/o rales, rhonchi, wheezing or stridor.  Cor: Heart sounds normal w/ regular rate and rhythm without sig. murmurs, gallops, clicks, or rubs. Peripheral pulses normal and equal  without edema.  Abdomen: Soft & bowel sounds normal. Non-tender w/o guarding, rebound, hernias, masses, or organomegaly.  Lymphatics: Unremarkable.  Musculoskeletal: Full ROM all peripheral extremities, joint stability, 5/5 strength, and normal gait.  Skin: Warm, dry without exposed rashes, lesions or ecchymosis apparent.  Neuro: Cranial nerves intact, reflexes equal bilaterally. Sensory-motor testing grossly intact. Tendon reflexes grossly intact.  Pysch: Alert & oriented x 3.  Insight and judgement nl & appropriate. No ideations.  Assessment and Plan:  1. Hypertension - Continue monitor blood pressure at home. Continue diet/meds same.  2. Hyperlipidemia - Continue diet/meds, exercise,& lifestyle modifications. Continue monitor periodic cholesterol/liver & renal functions   3. Pre-Diabetes - Continue diet, exercise, lifestyle modifications. Monitor appropriate labs.  4. Vitamin D Deficiency - Continue supplementation.   Recommended regular exercise, BP monitoring, weight control, and discussed med and SE's. Recommended labs to assess and monitor clinical status. Further disposition pending results of labs.

## 2014-01-15 NOTE — Patient Instructions (Signed)

## 2014-01-16 ENCOUNTER — Telehealth: Payer: Self-pay

## 2014-01-16 LAB — TSH: TSH: 1.082 u[IU]/mL (ref 0.350–4.500)

## 2014-01-16 LAB — LIPID PANEL
Cholesterol: 173 mg/dL (ref 0–200)
HDL: 39 mg/dL — ABNORMAL LOW (ref >39)
LDL Cholesterol: 109 mg/dL — ABNORMAL HIGH (ref 0–99)
Total CHOL/HDL Ratio: 4.4 Ratio
Triglycerides: 123 mg/dL (ref <150)
VLDL: 25 mg/dL (ref 0–40)

## 2014-01-16 LAB — HEPATIC FUNCTION PANEL
ALT: 14 U/L (ref 0–53)
AST: 16 U/L (ref 0–37)
Albumin: 4.3 g/dL (ref 3.5–5.2)
Alkaline Phosphatase: 51 U/L (ref 39–117)
Bilirubin, Direct: 0.1 mg/dL (ref 0.0–0.3)
Indirect Bilirubin: 0.6 mg/dL (ref 0.2–1.2)
Total Bilirubin: 0.7 mg/dL (ref 0.2–1.2)
Total Protein: 6.2 g/dL (ref 6.0–8.3)

## 2014-01-16 LAB — BASIC METABOLIC PANEL WITH GFR
BUN: 16 mg/dL (ref 6–23)
CO2: 27 mEq/L (ref 19–32)
Calcium: 9.5 mg/dL (ref 8.4–10.5)
Chloride: 106 mEq/L (ref 96–112)
Creat: 0.92 mg/dL (ref 0.50–1.35)
GFR, Est African American: >89 mL/min
GFR, Est Non African American: 79 mL/min
Glucose, Bld: 81 mg/dL (ref 70–99)
Potassium: 4.4 mEq/L (ref 3.5–5.3)
Sodium: 142 mEq/L (ref 135–145)

## 2014-01-16 LAB — VITAMIN D 25 HYDROXY (VIT D DEFICIENCY, FRACTURES): Vit D, 25-Hydroxy: 95 ng/mL — ABNORMAL HIGH (ref 30–89)

## 2014-01-16 LAB — MAGNESIUM: Magnesium: 1.9 mg/dL (ref 1.5–2.5)

## 2014-01-16 LAB — HEMOGLOBIN A1C
Hgb A1c MFr Bld: 5.9 % — ABNORMAL HIGH (ref <5.7)
Mean Plasma Glucose: 123 mg/dL — ABNORMAL HIGH (ref <117)

## 2014-01-16 LAB — INSULIN, FASTING: Insulin fasting, serum: 7 u[IU]/mL (ref 2.0–19.6)

## 2014-01-16 NOTE — Telephone Encounter (Signed)
Left voicemail for patient to return call for lab results.

## 2014-01-16 NOTE — Telephone Encounter (Signed)
-----   Message from Unk Pinto, MD sent at 01/16/2014  8:28 AM EST ----- - Chol 173 - great - slight elevation of LDL 109 - avoid animal products - Meat - Poultry & Dairy/esp cheese - A1c 5.9% - still borderline - avoid sweets/candy & white stuff - Vit D 95 - great  - All else Nl/OK

## 2014-01-23 ENCOUNTER — Ambulatory Visit (INDEPENDENT_AMBULATORY_CARE_PROVIDER_SITE_OTHER): Payer: Medicare Other | Admitting: *Deleted

## 2014-01-23 DIAGNOSIS — Z23 Encounter for immunization: Secondary | ICD-10-CM | POA: Diagnosis not present

## 2014-03-28 DIAGNOSIS — C61 Malignant neoplasm of prostate: Secondary | ICD-10-CM | POA: Diagnosis not present

## 2014-03-29 ENCOUNTER — Other Ambulatory Visit: Payer: Self-pay | Admitting: Internal Medicine

## 2014-04-04 DIAGNOSIS — N139 Obstructive and reflux uropathy, unspecified: Secondary | ICD-10-CM | POA: Diagnosis not present

## 2014-04-04 DIAGNOSIS — C61 Malignant neoplasm of prostate: Secondary | ICD-10-CM | POA: Diagnosis not present

## 2014-04-04 DIAGNOSIS — N401 Enlarged prostate with lower urinary tract symptoms: Secondary | ICD-10-CM | POA: Diagnosis not present

## 2014-04-18 ENCOUNTER — Ambulatory Visit: Payer: Self-pay | Admitting: Physician Assistant

## 2014-04-19 ENCOUNTER — Ambulatory Visit (INDEPENDENT_AMBULATORY_CARE_PROVIDER_SITE_OTHER): Payer: Medicare Other | Admitting: Physician Assistant

## 2014-04-19 ENCOUNTER — Encounter: Payer: Self-pay | Admitting: Physician Assistant

## 2014-04-19 VITALS — BP 140/80 | HR 56 | Temp 97.7°F | Resp 16 | Ht 66.25 in | Wt 134.0 lb

## 2014-04-19 DIAGNOSIS — R7309 Other abnormal glucose: Secondary | ICD-10-CM

## 2014-04-19 DIAGNOSIS — E782 Mixed hyperlipidemia: Secondary | ICD-10-CM | POA: Diagnosis not present

## 2014-04-19 DIAGNOSIS — I10 Essential (primary) hypertension: Secondary | ICD-10-CM

## 2014-04-19 DIAGNOSIS — Z79899 Other long term (current) drug therapy: Secondary | ICD-10-CM | POA: Diagnosis not present

## 2014-04-19 DIAGNOSIS — N400 Enlarged prostate without lower urinary tract symptoms: Secondary | ICD-10-CM

## 2014-04-19 DIAGNOSIS — E559 Vitamin D deficiency, unspecified: Secondary | ICD-10-CM | POA: Diagnosis not present

## 2014-04-19 DIAGNOSIS — R7303 Prediabetes: Secondary | ICD-10-CM

## 2014-04-19 DIAGNOSIS — C61 Malignant neoplasm of prostate: Secondary | ICD-10-CM | POA: Insufficient documentation

## 2014-04-19 LAB — CBC WITH DIFFERENTIAL/PLATELET
Basophils Absolute: 0 10*3/uL (ref 0.0–0.1)
Basophils Relative: 0 % (ref 0–1)
Eosinophils Absolute: 0 10*3/uL (ref 0.0–0.7)
Eosinophils Relative: 0 % (ref 0–5)
HCT: 46.9 % (ref 39.0–52.0)
Hemoglobin: 15.7 g/dL (ref 13.0–17.0)
Lymphocytes Relative: 25 % (ref 12–46)
Lymphs Abs: 1.4 10*3/uL (ref 0.7–4.0)
MCH: 30.4 pg (ref 26.0–34.0)
MCHC: 33.5 g/dL (ref 30.0–36.0)
MCV: 90.9 fL (ref 78.0–100.0)
MPV: 11.4 fL (ref 8.6–12.4)
Monocytes Absolute: 0.4 10*3/uL (ref 0.1–1.0)
Monocytes Relative: 8 % (ref 3–12)
Neutro Abs: 3.6 10*3/uL (ref 1.7–7.7)
Neutrophils Relative %: 67 % (ref 43–77)
Platelets: 168 10*3/uL (ref 150–400)
RBC: 5.16 MIL/uL (ref 4.22–5.81)
RDW: 13.2 % (ref 11.5–15.5)
WBC: 5.4 10*3/uL (ref 4.0–10.5)

## 2014-04-19 LAB — HEMOGLOBIN A1C
Hgb A1c MFr Bld: 5.5 % (ref <5.7)
Mean Plasma Glucose: 111 mg/dL (ref <117)

## 2014-04-19 NOTE — Patient Instructions (Signed)
Benefiber is good for constipation/diarrhea/irritable bowel syndrome, it helps with weight loss and can help lower your bad cholesterol. Please do 1-2 TBSP in the morning in water, coffee, or tea. It can take up to a month before you can see a difference with your bowel movements. It is cheapest from [employer], [employer], [employer].   Your LDL is not in range. Your LDL is the bad cholesterol that can lead to heart attack and stroke. To lower your number you can decrease your fatty foods, red meat, cheese, milk and increase fiber like whole grains and veggies. You can also add a fiber supplement like Metamucil or Benefiber.      Bad carbs also include fruit juice, alcohol, and sweet tea. These are empty calories that do not signal to your brain that you are full.   Please remember the good carbs are still carbs which convert into sugar. So please measure them out no more than 1/2-1 cup of rice, oatmeal, pasta, and beans  Veggies are however free foods! Pile them on.   Not all fruit is created equal. Please see the list below, the fruit at the bottom is higher in sugars than the fruit at the top. Please avoid all dried fruits.

## 2014-04-19 NOTE — Progress Notes (Signed)
Assessment and Plan:  Hypertension: Continue medication, monitor blood pressure at home. Continue DASH diet.  Reminder to go to the ER if any CP, SOB, nausea, dizziness, severe HA, changes vision/speech, left arm numbness and tingling, and jaw pain. Cholesterol: Continue diet and exercise. Check cholesterol.  Pre-diabetes-Continue diet and exercise. Check A1C Vitamin D Def- check level and continue medications.   Continue diet and meds as discussed. Further disposition pending results of labs.  HPI 81 y.o. male  presents for 3 month follow up with hypertension, hyperlipidemia, prediabetes and vitamin D.  His blood pressure has been controlled at home, 120's/70's, today their BP is BP: 140/80 mmHg  He does workout, walks when the weather is nice. He denies chest pain, shortness of breath, dizziness.  He is not on cholesterol medication and denies myalgias. His cholesterol is at goal. The cholesterol last visit was:   Lab Results  Component Value Date   CHOL 173 01/15/2014   HDL 39* 01/15/2014   LDLCALC 109* 01/15/2014   TRIG 123 01/15/2014   CHOLHDL 4.4 01/15/2014    He has been working on diet and exercise for prediabetes, and denies paresthesia of the feet, polydipsia, polyuria and visual disturbances. Last A1C in the office was:  Lab Results  Component Value Date   HGBA1C 5.9* 01/15/2014  Patient is on Vitamin D supplement.   Lab Results  Component Value Date   VD25OH 95* 01/15/2014   He takes care of his wife, Randell Patient, who has dementia.  He is on proscar 5mg  for BPH which helps, follows with Dr. Junious Silk. .  Wt Readings from Last 3 Encounters:  04/19/14 134 lb (60.782 kg)  01/15/14 134 lb 12.8 oz (61.145 kg)  10/12/13 138 lb (62.596 kg)    Current Medications:  Current Outpatient Prescriptions on File Prior to Visit  Medication Sig Dispense Refill  . atenolol (TENORMIN) 50 MG tablet TAKE 1 TABLET EVERY DAY FOR BLOOD PRESSURE 90 tablet 3  . BABY ASPIRIN PO Take 81 mg by  mouth daily.    . Cholecalciferol (VITAMIN D PO) Take 2,000 Int'l Units by mouth 2 (two) times daily.     . finasteride (PROSCAR) 5 MG tablet TAKE 1 TABLET EVERY DAY FOR BLOOD PRESSURE AND PROSTATE 90 tablet 3  . Flaxseed, Linseed, (FLAX SEED OIL PO) Take 1,200 mg by mouth 3 (three) times daily.     . Magnesium 250 MG TABS Take 250 mg by mouth daily.    . Omega-3 Fatty Acids (FISH OIL PO) Take 1,000 mg by mouth 2 (two) times daily.      No current facility-administered medications on file prior to visit.   Medical History:  Past Medical History  Diagnosis Date  . Vitamin D deficiency 07/13/2013  . Prediabetes 07/13/2013  . Hyperlipidemia 07/13/2013  . Essential hypertension 02/15/2007   Allergies:  Allergies  Allergen Reactions  . Pravastatin   . Red Yeast Rice [Cholestin]   . Sulfa Antibiotics   . Vibramycin [Doxycycline Calcium]   . Zetia [Ezetimibe]      Review of Systems:  Review of Systems  Constitutional: Negative.   HENT: Negative.   Eyes: Negative.   Respiratory: Negative.   Cardiovascular: Negative.   Gastrointestinal: Positive for constipation. Negative for heartburn, nausea, vomiting, abdominal pain, diarrhea, blood in stool and melena.  Genitourinary: Negative.   Musculoskeletal: Negative.   Skin: Negative.   Neurological: Negative.   Endo/Heme/Allergies: Negative.   Psychiatric/Behavioral: Negative.     Family history- Review and unchanged  Social history- Review and unchanged Physical Exam: BP 140/80 mmHg  Pulse 56  Temp(Src) 97.7 F (36.5 C)  Resp 16  Ht 5' 6.25" (1.683 m)  Wt 134 lb (60.782 kg)  BMI 21.46 kg/m2 Wt Readings from Last 3 Encounters:  04/19/14 134 lb (60.782 kg)  01/15/14 134 lb 12.8 oz (61.145 kg)  10/12/13 138 lb (62.596 kg)   General Appearance: Well nourished, in no apparent distress. Eyes: PERRLA, EOMs, conjunctiva no swelling or erythema Sinuses: No Frontal/maxillary tenderness ENT/Mouth: Ext aud canals clear, TMs without  erythema, bulging. No erythema, swelling, or exudate on post pharynx.  Tonsils not swollen or erythematous. Hearing normal.  Neck: Supple, thyroid normal.  Respiratory: Respiratory effort normal, BS equal bilaterally without rales, rhonchi, wheezing or stridor.  Cardio: RRR with no MRGs. Brisk peripheral pulses without edema.  Abdomen: Soft, + BS,  Non tender, no guarding, rebound, hernias, masses. Lymphatics: Non tender without lymphadenopathy.  Musculoskeletal: Full ROM, 5/5 strength, Normal gait Skin: Warm, dry without rashes, lesions, ecchymosis.  Neuro: Cranial nerves intact. Normal muscle tone, no cerebellar symptoms. Psych: Awake and oriented X 3, normal affect, Insight and Judgment appropriate.    Vicie Mutters, PA-C 11:45 AM [facility]

## 2014-04-20 LAB — TSH: TSH: 0.901 u[IU]/mL (ref 0.350–4.500)

## 2014-04-20 LAB — BASIC METABOLIC PANEL WITH GFR
BUN: 16 mg/dL (ref 6–23)
CO2: 28 mEq/L (ref 19–32)
Calcium: 9.4 mg/dL (ref 8.4–10.5)
Chloride: 106 mEq/L (ref 96–112)
Creat: 1.01 mg/dL (ref 0.50–1.35)
GFR, Est African American: 81 mL/min
GFR, Est Non African American: 70 mL/min
Glucose, Bld: 85 mg/dL (ref 70–99)
Potassium: 4.6 mEq/L (ref 3.5–5.3)
Sodium: 141 mEq/L (ref 135–145)

## 2014-04-20 LAB — HEPATIC FUNCTION PANEL
ALT: 16 U/L (ref 0–53)
AST: 17 U/L (ref 0–37)
Albumin: 4 g/dL (ref 3.5–5.2)
Alkaline Phosphatase: 51 U/L (ref 39–117)
Bilirubin, Direct: 0.1 mg/dL (ref 0.0–0.3)
Indirect Bilirubin: 0.7 mg/dL (ref 0.2–1.2)
Total Bilirubin: 0.8 mg/dL (ref 0.2–1.2)
Total Protein: 6.3 g/dL (ref 6.0–8.3)

## 2014-04-20 LAB — LIPID PANEL
Cholesterol: 173 mg/dL (ref 0–200)
HDL: 40 mg/dL (ref >39)
LDL Cholesterol: 113 mg/dL — ABNORMAL HIGH (ref 0–99)
Total CHOL/HDL Ratio: 4.3 Ratio
Triglycerides: 98 mg/dL (ref <150)
VLDL: 20 mg/dL (ref 0–40)

## 2014-04-20 LAB — MAGNESIUM: Magnesium: 2.1 mg/dL (ref 1.5–2.5)

## 2014-07-16 ENCOUNTER — Encounter: Payer: Self-pay | Admitting: Internal Medicine

## 2014-07-17 ENCOUNTER — Encounter: Payer: Self-pay | Admitting: Internal Medicine

## 2014-07-29 ENCOUNTER — Encounter: Payer: Self-pay | Admitting: Internal Medicine

## 2014-07-29 DIAGNOSIS — D860 Sarcoidosis of lung: Secondary | ICD-10-CM | POA: Insufficient documentation

## 2014-07-29 NOTE — Patient Instructions (Signed)
++++++++++++++++++++++++++++++++++  Recommend Low dose or baby Aspirin 81 mg daily   To reduce risk of Colon Cancer 20 %, Skin Cancer 26 % , Melanoma 46% and   Pancreatic cancer 60%  +++++++++++++++++++++++++++++++++ Vitamin D goal is between 70-100.   Please make sure that you are taking your Vitamin D as directed.   It is very important as a natural antiinflammatory   helping hair, skin, and nails, as well as reducing stroke and heart attack risk.   It helps your bones and helps with mood.  It also decreases numerous cancer risks so please take it as directed.   Low Vit D is associated with a 200-300% higher risk for CANCER   and 200-300% higher risk for HEART   ATTACK  &  STROKE.    ...................................................................................  It is also associated with higher death rate at younger ages,   autoimmune diseases like Rheumatoid arthritis, Lupus, Multiple Sclerosis.     Also many other serious conditions, like depression, Alzheimer's  Dementia, infertility, muscle aches, fatigue, fibromyalgia - just to name a few.  ++++++++++++++++++++++++++++++++++++++++ Recommend the book "The END of DIETING" by Dr Joel Fuhrman   & the book "The END of DIABETES " by Dr Joel Fuhrman  At [employer] - get book & Audio CD's     Being diabetic has a  300% increased risk for heart attack, stroke, cancer, and alzheimer- type vascular dementia. It is very important that you work harder with diet by avoiding all foods that are white. Avoid white rice (brown & wild rice is OK), white potatoes (sweetpotatoes in moderation is OK), White bread or wheat bread or anything made out of white flour like bagels, donuts, rolls, buns, biscuits, cakes, pastries, cookies, pizza crust, and pasta (made from white flour & egg whites) - vegetarian pasta or spinach or wheat pasta is OK. Multigrain breads like Arnold's or [employer], or multigrain sandwich thins or  flatbreads.  Diet, exercise and weight loss can reverse and cure diabetes in the early stages.  Diet, exercise and weight loss is very important in the control and prevention of complications of diabetes which affects every system in your body, ie. Brain - dementia/stroke, eyes - glaucoma/blindness, heart - heart attack/heart failure, kidneys - dialysis, stomach - gastric paralysis, intestines - malabsorption, nerves - severe painful neuritis, circulation - gangrene & loss of a leg(s), and finally cancer and Alzheimers.    I recommend avoid fried & greasy foods,  sweets/candy, white rice (brown or wild rice or Quinoa is OK), white potatoes (sweet potatoes are OK) - anything made from white flour - bagels, doughnuts, rolls, buns, biscuits,white and wheat breads, pizza crust and traditional pasta made of white flour & egg white(vegetarian pasta or spinach or wheat pasta is OK).  Multi-grain bread is OK - like multi-grain flat bread or sandwich thins. Avoid alcohol in excess. Exercise is also important.    Eat all the vegetables you want - avoid meat, especially red meat and dairy - especially cheese.  Cheese is the most concentrated form of trans-fats which is the worst thing to clog up our arteries. Veggie cheese is OK which can be found in the fresh produce section at [employer] or [employer] or [employer]  ++++++++++++++++++++++++++++++++++++++++++++++++++++++++ Preventive Care for Adults A healthy lifestyle and preventive care can promote health and wellness. Preventive health guidelines for men include the following key practices:  A routine yearly physical is a good way to check with your health care provider about your   health and preventative screening. It is a chance to share any concerns and updates on your health and to receive a thorough exam.  Visit your dentist for a routine exam and preventative care every 6 months. Brush your teeth twice a day and floss once a day. Good oral hygiene  prevents tooth decay and gum disease.  The frequency of eye exams is based on your age, health, family medical history, use of contact lenses, and other factors. Follow your health care provider's recommendations for frequency of eye exams.  Eat a healthy diet. Foods such as vegetables, fruits, whole grains, low-fat dairy products, and lean protein foods contain the nutrients you need without too many calories. Decrease your intake of foods high in solid fats, added sugars, and salt. Eat the right amount of calories for you.Get information about a proper diet from your health care provider, if necessary.  Regular physical exercise is one of the most important things you can do for your health. Most adults should get at least 150 minutes of moderate-intensity exercise (any activity that increases your heart rate and causes you to sweat) each week. In addition, most adults need muscle-strengthening exercises on 2 or more days a week.  Maintain a healthy weight. The body mass index (BMI) is a screening tool to identify possible weight problems. It provides an estimate of body fat based on height and weight. Your health care provider can find your BMI and can help you achieve or maintain a healthy weight.For adults 20 years and older:  A BMI below 18.5 is considered underweight.  A BMI of 18.5 to 24.9 is normal.  A BMI of 25 to 29.9 is considered overweight.  A BMI of 30 and above is considered obese.  Maintain normal blood lipids and cholesterol levels by exercising and minimizing your intake of saturated fat. Eat a balanced diet with plenty of fruit and vegetables. Blood tests for lipids and cholesterol should begin at age 20 and be repeated every 5 years. If your lipid or cholesterol levels are high, you are over 50, or you are at high risk for heart disease, you may need your cholesterol levels checked more frequently.Ongoing high lipid and cholesterol levels should be treated with medicines if  diet and exercise are not working.  If you smoke, find out from your health care provider how to quit. If you do not use tobacco, do not start.  Lung cancer screening is recommended for adults aged 55-80 years who are at high risk for developing lung cancer because of a history of smoking. A yearly low-dose CT scan of the lungs is recommended for people who have at least a 30-pack-year history of smoking and are a current smoker or have quit within the past 15 years. A pack year of smoking is smoking an average of 1 pack of cigarettes a day for 1 year (for example: 1 pack a day for 30 years or 2 packs a day for 15 years). Yearly screening should continue until the smoker has stopped smoking for at least 15 years. Yearly screening should be stopped for people who develop a health problem that would prevent them from having lung cancer treatment.  If you choose to drink alcohol, do not have more than 2 drinks per day. One drink is considered to be 12 ounces (355 mL) of beer, 5 ounces (148 mL) of wine, or 1.5 ounces (44 mL) of liquor.  Avoid use of street drugs. Do not share needles with anyone. Ask   for help if you need support or instructions about stopping the use of drugs.  High blood pressure causes heart disease and increases the risk of stroke. Your blood pressure should be checked at least every 1-2 years. Ongoing high blood pressure should be treated with medicines, if weight loss and exercise are not effective.  If you are 45-79 years old, ask your health care provider if you should take aspirin to prevent heart disease.  Diabetes screening involves taking a blood sample to check your fasting blood sugar level. Testing should be considered at a younger age or be carried out more frequently if you are overweight and have at least 1 risk factor for diabetes.  Colorectal cancer can be detected and often prevented. Most routine colorectal cancer screening begins at the age of 50 and continues  through age 75. However, your health care provider may recommend screening at an earlier age if you have risk factors for colon cancer. On a yearly basis, your health care provider may provide home test kits to check for hidden blood in the stool. Use of a small camera at the end of a tube to directly examine the colon (sigmoidoscopy or colonoscopy) can detect the earliest forms of colorectal cancer. Talk to your health care provider about this at age 50, when routine screening begins. Direct exam of the colon should be repeated every 5-10 years through age 75, unless early forms of precancerous polyps or small growths are found.  Hepatitis C blood testing is recommended for all people born from 1945 through 1965 and any individual with known risks for hepatitis C.  Screening for abdominal aortic aneurysm (AAA)  by ultrasound is recommended for people who have history of high blood pressure or who are current or former smokers.  Healthy men should  receive prostate-specific antigen (PSA) blood tests as part of routine cancer screening. Talk with your health care provider about prostate cancer screening.  Testicular cancer screening is  recommended for adult males. Screening includes self-exam, a health care provider exam, and other screening tests. Consult with your health care provider about any symptoms you have or any concerns you have about testicular cancer.  Use sunscreen. Apply sunscreen liberally and repeatedly throughout the day. You should seek shade when your shadow is shorter than you. Protect yourself by wearing long sleeves, pants, a wide-brimmed hat, and sunglasses year round, whenever you are outdoors.  Once a month, do a whole-body skin exam, using a mirror to look at the skin on your back. Tell your health care provider about new moles, moles that have irregular borders, moles that are larger than a pencil eraser, or moles that have changed in shape or color.  Stay current with  required vaccines (immunizations).  Influenza vaccine. All adults should be immunized every year.  Tetanus, diphtheria, and acellular pertussis (Td, Tdap) vaccine. An adult who has not previously received Tdap or who does not know his vaccine status should receive 1 dose of Tdap. This initial dose should be followed by tetanus and diphtheria toxoids (Td) booster doses every 10 years. Adults with an unknown or incomplete history of completing a 3-dose immunization series with Td-containing vaccines should begin or complete a primary immunization series including a Tdap dose. Adults should receive a Td booster every 10 years.  Zoster vaccine. One dose is recommended for adults aged 60 years or older unless certain conditions are present.    PREVNAR - Pneumococcal 13-valent conjugate (PCV13) vaccine. When indicated, a person who is   uncertain of his immunization history and has no record of immunization should receive the PCV13 vaccine. An adult aged 19 years or older who has certain medical conditions and has not been previously immunized should receive 1 dose of PCV13 vaccine. This PCV13 should be followed with a dose of pneumococcal polysaccharide (PPSV23) vaccine. The PPSV23 vaccine dose should be obtained at least 8 weeks after the dose of PCV13 vaccine. An adult aged 19 years or older who has certain medical conditions and previously received 1 or more doses of PPSV23 vaccine should receive 1 dose of PCV13. The PCV13 vaccine dose should be obtained 1 or more years after the last PPSV23 vaccine dose.    PNEUMOVAX - Pneumococcal polysaccharide (PPSV23) vaccine. When PCV13 is also indicated, PCV13 should be obtained first. All adults aged 65 years and older should be immunized. An adult younger than age 65 years who has certain medical conditions should be immunized. Any person who resides in a nursing home or long-term care facility should be immunized. An adult smoker should be immunized. People with  an immunocompromised condition and certain other conditions should receive both PCV13 and PPSV23 vaccines. People with human immunodeficiency virus (HIV) infection should be immunized as soon as possible after diagnosis. Immunization during chemotherapy or radiation therapy should be avoided. Routine use of PPSV23 vaccine is not recommended for American Indians, [place] Natives, or people younger than 65 years unless there are medical conditions that require PPSV23 vaccine. When indicated, people who have unknown immunization and have no record of immunization should receive PPSV23 vaccine. One-time revaccination 5 years after the first dose of PPSV23 is recommended for people aged 19-64 years who have chronic kidney failure, nephrotic syndrome, asplenia, or immunocompromised conditions. People who received 1-2 doses of PPSV23 before age 65 years should receive another dose of PPSV23 vaccine at age 65 years or later if at least 5 years have passed since the previous dose. Doses of PPSV23 are not needed for people immunized with PPSV23 at or after age 65 years.    Hepatitis A vaccine. Adults who wish to be protected from this disease, have certain high-risk conditions, work with hepatitis A-infected animals, work in hepatitis A research labs, or travel to or work in countries with a high rate of hepatitis A should be immunized. Adults who were previously unvaccinated and who anticipate close contact with an international adoptee during the first 60 days after arrival in the [place] from a country with a high rate of hepatitis A should be immunized.    Hepatitis B vaccine. Adults should be immunized if they wish to be protected from this disease, have certain high-risk conditions, may be exposed to blood or other infectious body fluids, are household contacts or sex partners of hepatitis B positive people, are clients or workers in certain care facilities, or travel to or work in countries with a high  rate of hepatitis B.   Preventive Service / Frequency   Ages 65 and over  Blood pressure check.  Lipid and cholesterol check.  Lung cancer screening. / Every year if you are aged 55-80 years and have a 30-pack-year history of smoking and currently smoke or have quit within the past 15 years. Yearly screening is stopped once you have quit smoking for at least 15 years or develop a health problem that would prevent you from having lung cancer treatment.  Fecal occult blood test (FOBT) of stool. You may not have to do this test if you get a   colonoscopy every 10 years.  Flexible sigmoidoscopy** or colonoscopy.** / Every 5 years for a flexible sigmoidoscopy or every 10 years for a colonoscopy beginning at age 50 and continuing until age 75.  Hepatitis C blood test.** / For all people born from 1945 through 1965 and any individual with known risks for hepatitis C.  Abdominal aortic aneurysm (AAA) screening./ Screening current or former smokers or have Hypertension.  Skin self-exam. / Monthly.  Influenza vaccine. / Every year.  Tetanus, diphtheria, and acellular pertussis (Tdap/Td) vaccine.** / 1 dose of Td every 10 years.   Zoster vaccine.** / 1 dose for adults aged 60 years or older.         Pneumococcal 13-valent conjugate (PCV13) vaccine.    Pneumococcal polysaccharide (PPSV23) vaccine.     Hepatitis A vaccine.** / Consult your health care provider.  Hepatitis B vaccine.** / Consult your health care provider. Screening for abdominal aortic aneurysm (AAA)  by ultrasound is recommended for people who have history of high blood pressure or who are current or former smokers. 

## 2014-07-29 NOTE — Progress Notes (Signed)
Patient ID: Matthew Aguirre, male   DOB: [birth year], 81 y.o.   MRN: [id number]  Annual Comprehensive Examination  This very nice 81 y.o. MWM presents for complete physical.  Patient has been followed for HTN, Prediabetes, Hyperlipidemia and Vitamin D Deficiency. Patient has very remote hx/o pulmonary Sarcoid.    HTN predates since 2007. Patient's BP has been controlled at home.Today's BP: 136/76 mmHg. Patient denies any cardiac symptoms as chest pain, palpitations, shortness of breath, dizziness or ankle swelling.   Patient's hyperlipidemia is not controlled with diet. Last lipids were not at goal - Total Chol 173; HDL 40; LDL 113; Trig 98  04/19/2014.   Patient has prediabetes since April 2011 with A1c 5.8% and patient denies reactive hypoglycemic symptoms, visual blurring, diabetic polys or paresthesias. Last A1c was  5.5% on 04/19/2014.   Finally, patient has history of Vitamin D Deficiency of 41 in 2008 and last vitamin D was 95 on 01/15/2014.      Medication Sig  . atenolol  50 MG tablet TAKE 1 TABLET EVERY DAY FOR BLOOD PRESSURE  . BABY ASPIRIN PO Take 81 mg by mouth daily.  Marland Kitchen VITAMIN D Take 2,000 Int'l Units by mouth 2 (two) times daily.   . finasteride  5 MG tablet TAKE 1 TABLET EVERY DAY FOR BLOOD PRESSURE AND PROSTATE  . FLAX SEED OIL  Take 1,200 mg by mouth 3 (three) times daily.   . Magnesium 250 MG TABS Take 250 mg by mouth daily.  Marland Kitchen FISH OIL Take 1,000 mg by mouth 2 (two) times daily.    Allergies  Allergen Reactions  . Pravastatin   . Red Yeast Rice [Cholestin]   . Sulfa Antibiotics   . Vibramycin [Doxycycline Calcium]   . Zetia [Ezetimibe]    Past Medical History  Diagnosis Date  . Vitamin D deficiency 07/13/2013  . Prediabetes 07/13/2013  . Hyperlipidemia 07/13/2013  . Essential hypertension 02/15/2007   Health Maintenance  Topic Date Due  . ZOSTAVAX  03/30/1995  . COLONOSCOPY  03/21/2012  . PNA vac Low Risk Adult (2 of 2 - PPSV23) 10/13/2014  . INFLUENZA VACCINE   10/15/2014   Immunization History  Administered Date(s) Administered  . Influenza, High Dose Seasonal PF 01/23/2014   DT booster  2005  . Pneumococcal Conjugate-13 10/12/2013   History   Social History  . Marital Status: Married    Spouse Name: N/A  . Number of Children: N/A  . Years of Education: N/A   Occupational History  . 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   Social History Main Topics  . Smoking status: Never Smoker   . Smokeless tobacco: No  . Alcohol Use: No  . Drug Use: No illicit  . Sexual Activity: No    ROS Constitutional: Denies fever, chills, weight loss/gain, headaches, insomnia,  night sweats or change in appetite. Does c/o fatigue. Eyes: Denies redness, blurred vision, diplopia, discharge, itchy or watery eyes.  ENT: Denies discharge, congestion, post nasal drip, epistaxis, sore throat, earache, hearing loss, dental pain, Tinnitus, Vertigo, Sinus pain or snoring.  Cardio: Denies chest pain, palpitations, irregular heartbeat, syncope, dyspnea, diaphoresis, orthopnea, PND, claudication or edema Respiratory: denies cough, dyspnea, DOE, pleurisy, hoarseness, laryngitis or wheezing.  Gastrointestinal: Denies dysphagia, heartburn, reflux, water brash, pain, cramps, nausea, vomiting, bloating, diarrhea, constipation, hematemesis, melena, hematochezia, jaundice or hemorrhoids Genitourinary: Denies dysuria, frequency, urgency, nocturia, hesitancy, discharge, hematuria or flank pain Musculoskeletal: Denies arthralgia, myalgia, stiffness,  Jt. Swelling, pain, limp or strain/sprain. Denies Falls. Skin: Denies puritis, rash, hives, warts, acne, eczema or change in skin lesion Neuro: No weakness, tremor, incoordination, spasms, paresthesia or pain Psychiatric: Denies confusion, memory loss or sensory loss. Denies Depression. Endocrine: Denies change in weight, skin, hair change, nocturia, and  paresthesia, diabetic polys, visual blurring or hyper / hypo glycemic episodes.  Heme/Lymph: No excessive bleeding, bruising or enlarged lymph nodes.  Physical Exam  BP 136/76   Pulse 72  Temp 97.5 F   Resp 16  Ht 5' 6.75"   Wt 129 lb 9.6 oz     BMI 20.46   General Appearance: Well nourished, in no apparent distress. Eyes: PERRLA, EOMs, conjunctiva no swelling or erythema, normal fundi and vessels. Sinuses: No frontal/maxillary tenderness ENT/Mouth: EACs patent / TMs  nl. Nares clear without erythema, swelling, mucoid exudates. Oral hygiene is good. No erythema, swelling, or exudate. Tongue normal, non-obstructing. Tonsils not swollen or erythematous. Hearing normal.  Neck: Supple, thyroid normal. No bruits, nodes or JVD. Respiratory: Respiratory effort normal.  BS equal and clear bilateral without rales, rhonci, wheezing or stridor. Cardio: Heart sounds are normal with regular rate and rhythm and no murmurs, rubs or gallops. Peripheral pulses are normal and equal bilaterally without edema. No aortic or femoral bruits. Chest: symmetric with normal excursions and percussion.  Abdomen: Flat, soft, with bowel sounds. Nontender, no guarding, rebound, hernias, masses, or organomegaly.  Lymphatics: Non tender without lymphadenopathy.  Genitourinary: deferred to Dr Junious Silk (sees 2 x /yr) Musculoskeletal: Full ROM all peripheral extremities, joint stability, 5/5 strength, and normal gait. Skin: Warm and dry without rashes, lesions, cyanosis, clubbing or  ecchymosis.  Neuro: Cranial nerves intact, reflexes equal bilaterally. Normal muscle tone, no cerebellar symptoms. Sensation intact.  Pysch: Awake and oriented X 3 with normal affect, insight and judgment appropriate.   Assessment and Plan  1. Essential hypertension  - Microalbumin / creatinine urine ratio - EKG 12-Lead - [place], RETROPERITNL ABD,  LTD - DG Chest 2 View; Future - TSH  2. Hyperlipidemia  - Lipid panel  3.  Prediabetes  - Hemoglobin A1c - Insulin, random  4. Vitamin D deficiency  - Vit D  25 hydroxy   5. BPH (benign prostatic hypertrophy)   6. Pulmonary sarcoidosis (1988)   - DG Chest 2 View; Future  7. Depression screen  - Negative screen  8. At low risk for fall   9. Screening for rectal cancer  - POC Hemoccult Bld/Stl   10. Prostate cancer screening   11. Medication management  - Urine Microscopic - CBC with Differential/Platelet - BASIC METABOLIC PANEL WITH GFR - Hepatic function panel - Magnesium   Continue prudent diet as discussed, weight control, BP monitoring, regular exercise, and medications as discussed.  Discussed med effects and SE's. Routine screening labs and tests as requested with regular follow-up as recommended.  Over 40 minutes of exam, counseling &  chart review was performed

## 2014-07-30 ENCOUNTER — Ambulatory Visit (INDEPENDENT_AMBULATORY_CARE_PROVIDER_SITE_OTHER): Payer: Medicare Other | Admitting: Internal Medicine

## 2014-07-30 ENCOUNTER — Encounter: Payer: Self-pay | Admitting: Internal Medicine

## 2014-07-30 VITALS — BP 136/76 | HR 72 | Temp 97.5°F | Resp 16 | Ht 66.75 in | Wt 129.6 lb

## 2014-07-30 DIAGNOSIS — R7309 Other abnormal glucose: Secondary | ICD-10-CM | POA: Diagnosis not present

## 2014-07-30 DIAGNOSIS — Z125 Encounter for screening for malignant neoplasm of prostate: Secondary | ICD-10-CM

## 2014-07-30 DIAGNOSIS — E782 Mixed hyperlipidemia: Secondary | ICD-10-CM

## 2014-07-30 DIAGNOSIS — R7303 Prediabetes: Secondary | ICD-10-CM

## 2014-07-30 DIAGNOSIS — Z1212 Encounter for screening for malignant neoplasm of rectum: Secondary | ICD-10-CM

## 2014-07-30 DIAGNOSIS — E559 Vitamin D deficiency, unspecified: Secondary | ICD-10-CM | POA: Diagnosis not present

## 2014-07-30 DIAGNOSIS — I10 Essential (primary) hypertension: Secondary | ICD-10-CM | POA: Diagnosis not present

## 2014-07-30 DIAGNOSIS — Z79899 Other long term (current) drug therapy: Secondary | ICD-10-CM

## 2014-07-30 DIAGNOSIS — Z9181 History of falling: Secondary | ICD-10-CM

## 2014-07-30 DIAGNOSIS — D860 Sarcoidosis of lung: Secondary | ICD-10-CM

## 2014-07-30 DIAGNOSIS — R972 Elevated prostate specific antigen [PSA]: Secondary | ICD-10-CM

## 2014-07-30 DIAGNOSIS — Z1331 Encounter for screening for depression: Secondary | ICD-10-CM

## 2014-07-30 DIAGNOSIS — N400 Enlarged prostate without lower urinary tract symptoms: Secondary | ICD-10-CM

## 2014-07-30 LAB — CBC WITH DIFFERENTIAL/PLATELET
Basophils Absolute: 0 10*3/uL (ref 0.0–0.1)
Basophils Relative: 0 % (ref 0–1)
Eosinophils Absolute: 0 10*3/uL (ref 0.0–0.7)
Eosinophils Relative: 0 % (ref 0–5)
HCT: 46.7 % (ref 39.0–52.0)
Hemoglobin: 15.6 g/dL (ref 13.0–17.0)
Lymphocytes Relative: 15 % (ref 12–46)
Lymphs Abs: 1 10*3/uL (ref 0.7–4.0)
MCH: 30.4 pg (ref 26.0–34.0)
MCHC: 33.4 g/dL (ref 30.0–36.0)
MCV: 90.9 fL (ref 78.0–100.0)
MPV: 11.7 fL (ref 8.6–12.4)
Monocytes Absolute: 0.5 10*3/uL (ref 0.1–1.0)
Monocytes Relative: 8 % (ref 3–12)
Neutro Abs: 5.1 10*3/uL (ref 1.7–7.7)
Neutrophils Relative %: 77 % (ref 43–77)
Platelets: 165 10*3/uL (ref 150–400)
RBC: 5.14 MIL/uL (ref 4.22–5.81)
RDW: 13.5 % (ref 11.5–15.5)
WBC: 6.6 10*3/uL (ref 4.0–10.5)

## 2014-07-30 LAB — HEMOGLOBIN A1C
Hgb A1c MFr Bld: 5.7 % — ABNORMAL HIGH (ref <5.7)
Mean Plasma Glucose: 117 mg/dL — ABNORMAL HIGH (ref <117)

## 2014-07-31 LAB — BASIC METABOLIC PANEL WITH GFR
BUN: 13 mg/dL (ref 6–23)
CO2: 28 mEq/L (ref 19–32)
Calcium: 9.7 mg/dL (ref 8.4–10.5)
Chloride: 105 mEq/L (ref 96–112)
Creat: 0.88 mg/dL (ref 0.50–1.35)
GFR, Est African American: >89 mL/min
GFR, Est Non African American: 82 mL/min
Glucose, Bld: 85 mg/dL (ref 70–99)
Potassium: 4.2 mEq/L (ref 3.5–5.3)
Sodium: 141 mEq/L (ref 135–145)

## 2014-07-31 LAB — LIPID PANEL
Cholesterol: 168 mg/dL (ref 0–200)
HDL: 40 mg/dL (ref >=40)
LDL Cholesterol: 105 mg/dL — ABNORMAL HIGH (ref 0–99)
Total CHOL/HDL Ratio: 4.2 Ratio
Triglycerides: 113 mg/dL (ref <150)
VLDL: 23 mg/dL (ref 0–40)

## 2014-07-31 LAB — URINALYSIS, MICROSCOPIC ONLY
Bacteria, UA: NONE SEEN
Casts: NONE SEEN
Crystals: NONE SEEN
Squamous Epithelial / LPF: NONE SEEN

## 2014-07-31 LAB — MAGNESIUM: Magnesium: 2 mg/dL (ref 1.5–2.5)

## 2014-07-31 LAB — HEPATIC FUNCTION PANEL
ALT: 14 U/L (ref 0–53)
AST: 16 U/L (ref 0–37)
Albumin: 4.1 g/dL (ref 3.5–5.2)
Alkaline Phosphatase: 51 U/L (ref 39–117)
Bilirubin, Direct: 0.2 mg/dL (ref 0.0–0.3)
Indirect Bilirubin: 0.7 mg/dL (ref 0.2–1.2)
Total Bilirubin: 0.9 mg/dL (ref 0.2–1.2)
Total Protein: 6.6 g/dL (ref 6.0–8.3)

## 2014-07-31 LAB — INSULIN, RANDOM: Insulin: 6.1 u[IU]/mL (ref 2.0–19.6)

## 2014-07-31 LAB — MICROALBUMIN / CREATININE URINE RATIO
Creatinine, Urine: 66.6 mg/dL
Microalb Creat Ratio: 3 mg/g (ref 0.0–30.0)
Microalb, Ur: 0.2 mg/dL (ref <2.0)

## 2014-07-31 LAB — VITAMIN D 25 HYDROXY (VIT D DEFICIENCY, FRACTURES): Vit D, 25-Hydroxy: 75 ng/mL (ref 30–100)

## 2014-07-31 LAB — TSH: TSH: 0.813 u[IU]/mL (ref 0.350–4.500)

## 2014-08-02 ENCOUNTER — Ambulatory Visit (HOSPITAL_COMMUNITY)
Admission: RE | Admit: 2014-08-02 | Discharge: 2014-08-02 | Disposition: A | Discharge status: Home / Self Care | Payer: Medicare Other | Source: Ambulatory Visit | Attending: Internal Medicine | Admitting: Internal Medicine

## 2014-08-02 DIAGNOSIS — I10 Essential (primary) hypertension: Secondary | ICD-10-CM | POA: Diagnosis not present

## 2014-08-02 DIAGNOSIS — Z0000 Encounter for general adult medical examination without abnormal findings: Secondary | ICD-10-CM | POA: Diagnosis not present

## 2014-08-02 DIAGNOSIS — D860 Sarcoidosis of lung: Secondary | ICD-10-CM | POA: Diagnosis not present

## 2014-08-03 ENCOUNTER — Other Ambulatory Visit (INDEPENDENT_AMBULATORY_CARE_PROVIDER_SITE_OTHER): Payer: Medicare Other

## 2014-08-03 DIAGNOSIS — Z1212 Encounter for screening for malignant neoplasm of rectum: Secondary | ICD-10-CM

## 2014-08-03 LAB — POC HEMOCCULT BLD/STL (HOME/3-CARD/SCREEN)
Card #2 Fecal Occult Blod, POC: NEGATIVE
Card #3 Fecal Occult Blood, POC: NEGATIVE
Fecal Occult Blood, POC: NEGATIVE

## 2014-10-03 DIAGNOSIS — C61 Malignant neoplasm of prostate: Secondary | ICD-10-CM | POA: Diagnosis not present

## 2014-10-10 DIAGNOSIS — C61 Malignant neoplasm of prostate: Secondary | ICD-10-CM | POA: Diagnosis not present

## 2014-11-01 ENCOUNTER — Ambulatory Visit (INDEPENDENT_AMBULATORY_CARE_PROVIDER_SITE_OTHER): Payer: Medicare Other | Admitting: Internal Medicine

## 2014-11-01 ENCOUNTER — Encounter: Payer: Self-pay | Admitting: Internal Medicine

## 2014-11-01 VITALS — BP 158/90 | HR 74 | Temp 98.0°F | Resp 16 | Ht 66.75 in | Wt 130.0 lb

## 2014-11-01 DIAGNOSIS — E559 Vitamin D deficiency, unspecified: Secondary | ICD-10-CM

## 2014-11-01 DIAGNOSIS — E782 Mixed hyperlipidemia: Secondary | ICD-10-CM

## 2014-11-01 DIAGNOSIS — R7303 Prediabetes: Secondary | ICD-10-CM

## 2014-11-01 DIAGNOSIS — I10 Essential (primary) hypertension: Secondary | ICD-10-CM | POA: Diagnosis not present

## 2014-11-01 DIAGNOSIS — Z79899 Other long term (current) drug therapy: Secondary | ICD-10-CM | POA: Diagnosis not present

## 2014-11-01 DIAGNOSIS — R7309 Other abnormal glucose: Secondary | ICD-10-CM

## 2014-11-01 LAB — CBC WITH DIFFERENTIAL/PLATELET
Basophils Absolute: 0 10*3/uL (ref 0.0–0.1)
Basophils Relative: 0 % (ref 0–1)
Eosinophils Absolute: 0 10*3/uL (ref 0.0–0.7)
Eosinophils Relative: 0 % (ref 0–5)
HCT: 44.7 % (ref 39.0–52.0)
Hemoglobin: 15.3 g/dL (ref 13.0–17.0)
Lymphocytes Relative: 19 % (ref 12–46)
Lymphs Abs: 1 10*3/uL (ref 0.7–4.0)
MCH: 30.7 pg (ref 26.0–34.0)
MCHC: 34.2 g/dL (ref 30.0–36.0)
MCV: 89.8 fL (ref 78.0–100.0)
MPV: 10.7 fL (ref 8.6–12.4)
Monocytes Absolute: 0.3 10*3/uL (ref 0.1–1.0)
Monocytes Relative: 6 % (ref 3–12)
Neutro Abs: 4.1 10*3/uL (ref 1.7–7.7)
Neutrophils Relative %: 75 % (ref 43–77)
Platelets: 157 10*3/uL (ref 150–400)
RBC: 4.98 MIL/uL (ref 4.22–5.81)
RDW: 13.4 % (ref 11.5–15.5)
WBC: 5.4 10*3/uL (ref 4.0–10.5)

## 2014-11-01 LAB — TSH: TSH: 0.781 u[IU]/mL (ref 0.350–4.500)

## 2014-11-01 LAB — HEPATIC FUNCTION PANEL
ALT: 16 U/L (ref 9–46)
AST: 18 U/L (ref 10–35)
Albumin: 4.2 g/dL (ref 3.6–5.1)
Alkaline Phosphatase: 53 U/L (ref 40–115)
Bilirubin, Direct: 0.2 mg/dL (ref <=0.2)
Indirect Bilirubin: 0.6 mg/dL (ref 0.2–1.2)
Total Bilirubin: 0.8 mg/dL (ref 0.2–1.2)
Total Protein: 6.4 g/dL (ref 6.1–8.1)

## 2014-11-01 LAB — BASIC METABOLIC PANEL WITH GFR
BUN: 15 mg/dL (ref 7–25)
CO2: 27 mmol/L (ref 20–31)
Calcium: 9.5 mg/dL (ref 8.6–10.3)
Chloride: 105 mmol/L (ref 98–110)
Creat: 0.94 mg/dL (ref 0.70–1.18)
GFR, Est African American: 89 mL/min (ref >=60)
GFR, Est Non African American: 77 mL/min (ref >=60)
Glucose, Bld: 88 mg/dL (ref 65–99)
Potassium: 4.4 mmol/L (ref 3.5–5.3)
Sodium: 140 mmol/L (ref 135–146)

## 2014-11-01 LAB — LIPID PANEL
Cholesterol: 160 mg/dL (ref 125–200)
HDL: 40 mg/dL (ref >=40)
LDL Cholesterol: 102 mg/dL (ref <130)
Total CHOL/HDL Ratio: 4 Ratio (ref <=5.0)
Triglycerides: 91 mg/dL (ref <150)
VLDL: 18 mg/dL (ref <30)

## 2014-11-01 LAB — HEMOGLOBIN A1C
Hgb A1c MFr Bld: 5.8 % — ABNORMAL HIGH (ref <5.7)
Mean Plasma Glucose: 120 mg/dL — ABNORMAL HIGH (ref <117)

## 2014-11-01 LAB — MAGNESIUM: Magnesium: 2.4 mg/dL (ref 1.5–2.5)

## 2014-11-01 NOTE — Progress Notes (Signed)
Patient ID: TOBE KERVIN, male   DOB: 11-Jun-1933, 81 y.o.   MRN: [id number]  Assessment and Plan:  Hypertension:  -slightly high today.  Patient feels that this is due to stress and he wants to monitor at home.  He will call office if consistently over 150/90 -Continue medication,  -monitor blood pressure at home.  -Continue DASH diet.   -Reminder to go to the ER if any CP, SOB, nausea, dizziness, severe HA, changes vision/speech, left arm numbness and tingling, and jaw pain.  Cholesterol: -Continue diet and exercise.  -Check cholesterol.   Pre-diabetes: -Continue diet and exercise.  -Check A1C  Vitamin D Def: -check level -continue medications.   Continue diet and meds as discussed. Further disposition pending results of labs.  HPI 80 y.o. male  presents for 3 month follow up with hypertension, hyperlipidemia, prediabetes and vitamin D.   His blood pressure has been controlled at home, today their BP is BP: (!) 158/90 mmHg.   He does workout. He denies chest pain, shortness of breath, dizziness.  He reports that blood pressure at home has been less than 120/70.  He reports that he walks a mile every day.  He reports that this is part of his daily routine.     He is on cholesterol medication and denies myalgias. His cholesterol is at goal. The cholesterol last visit was:   Lab Results  Component Value Date   CHOL 168 07/30/2014   HDL 40 07/30/2014   LDLCALC 105* 07/30/2014   TRIG 113 07/30/2014   CHOLHDL 4.2 07/30/2014     He has been working on diet and exercise for prediabetes, and denies foot ulcerations, hyperglycemia, hypoglycemia , increased appetite, nausea, paresthesia of the feet, polydipsia, polyuria, visual disturbances, vomiting and weight loss. Last A1C in the office was:  Lab Results  Component Value Date   HGBA1C 5.7* 07/30/2014    Patient is on Vitamin D supplement.  Lab Results  Component Value Date   VD25OH 53 07/30/2014      Current  Medications:  Current Outpatient Prescriptions on File Prior to Visit  Medication Sig Dispense Refill  . atenolol (TENORMIN) 50 MG tablet TAKE 1 TABLET EVERY DAY FOR BLOOD PRESSURE 90 tablet 3  . BABY ASPIRIN PO Take 81 mg by mouth daily.    . Cholecalciferol (VITAMIN D PO) Take 2,000 Int'l Units by mouth 2 (two) times daily.     . finasteride (PROSCAR) 5 MG tablet TAKE 1 TABLET EVERY DAY FOR BLOOD PRESSURE AND PROSTATE 90 tablet 3  . Flaxseed, Linseed, (FLAX SEED OIL PO) Take 1,200 mg by mouth 3 (three) times daily.     . Magnesium 250 MG TABS Take 250 mg by mouth daily.    . Omega-3 Fatty Acids (FISH OIL PO) Take 1,000 mg by mouth 2 (two) times daily.      No current facility-administered medications on file prior to visit.    Medical History:  Past Medical History  Diagnosis Date  . Vitamin D deficiency 07/13/2013  . Prediabetes 07/13/2013  . Hyperlipidemia 07/13/2013  . Essential hypertension 02/15/2007    Allergies:  Allergies  Allergen Reactions  . Pravastatin   . Red Yeast Rice [Cholestin]   . Sulfa Antibiotics   . Vibramycin [Doxycycline Calcium]   . Zetia [Ezetimibe]      Review of Systems:  Review of Systems  Constitutional: Negative for fever, chills and malaise/fatigue.  HENT: Negative for congestion, ear pain and sore throat.   Eyes:  Negative.   Respiratory: Negative for cough, shortness of breath and wheezing.   Cardiovascular: Negative for chest pain, palpitations and leg swelling.  Gastrointestinal: Negative for diarrhea, constipation, blood in stool and melena.  Genitourinary: Negative.   Skin: Negative.   Neurological: Negative for dizziness, loss of consciousness and headaches.  Psychiatric/Behavioral: Negative for depression. The patient is not nervous/anxious and does not have insomnia.     Family history- Review and unchanged  Social history- Review and unchanged  Physical Exam: BP 158/90 mmHg  Pulse 74  Temp(Src) 98 F (36.7 C) (Temporal)   Resp 16  Ht 5' 6.75" (1.695 m)  Wt 130 lb (58.968 kg)  BMI 20.52 kg/m2 Wt Readings from Last 3 Encounters:  11/01/14 130 lb (58.968 kg)  07/30/14 129 lb 9.6 oz (58.786 kg)  04/19/14 134 lb (60.782 kg)    General Appearance: Well nourished well developed, in no apparent distress. Eyes: PERRLA, EOMs, conjunctiva no swelling or erythema ENT/Mouth: Ear canals normal without obstruction, swelling, erythma, discharge.  TMs normal bilaterally.  Oropharynx moist, clear, without exudate, or postoropharyngeal swelling. Neck: Supple, thyroid normal,no cervical adenopathy  Respiratory: Respiratory effort normal, Breath sounds clear A&P without rhonchi, wheeze, or rale.  No retractions, no accessory usage. Cardio: RRR with no MRGs. Brisk peripheral pulses without edema.  Abdomen: Soft, + BS,  Non tender, no guarding, rebound, hernias, masses. Musculoskeletal: Full ROM, 5/5 strength, Normal gait Skin: Warm, dry without rashes, lesions, ecchymosis.  Neuro: Awake and oriented X 3, Cranial nerves intact. Normal muscle tone, no cerebellar symptoms. Psych: Normal affect, Insight and Judgment appropriate.    Starlyn Skeans, PA-C 10:35 AM [facility]

## 2014-11-01 NOTE — Patient Instructions (Signed)
Monitor your blood pressure at home and if it is consistently elevated greater than 150/90 or above please let me know.  If you feel that you are overwhelmed or you need additional assistance at home we may be able to use some resources.

## 2014-11-02 LAB — INSULIN, RANDOM: Insulin: 8.8 u[IU]/mL (ref 2.0–19.6)

## 2014-11-02 LAB — VITAMIN D 25 HYDROXY (VIT D DEFICIENCY, FRACTURES): Vit D, 25-Hydroxy: 75 ng/mL (ref 30–100)

## 2015-01-09 ENCOUNTER — Other Ambulatory Visit: Payer: Self-pay | Admitting: Internal Medicine

## 2015-01-21 DIAGNOSIS — Z961 Presence of intraocular lens: Secondary | ICD-10-CM | POA: Diagnosis not present

## 2015-02-13 ENCOUNTER — Ambulatory Visit (INDEPENDENT_AMBULATORY_CARE_PROVIDER_SITE_OTHER): Payer: Medicare Other | Admitting: Internal Medicine

## 2015-02-13 ENCOUNTER — Encounter: Payer: Self-pay | Admitting: Internal Medicine

## 2015-02-13 VITALS — BP 140/84 | HR 60 | Temp 97.0°F | Resp 16 | Ht 66.75 in | Wt 131.8 lb

## 2015-02-13 DIAGNOSIS — R7303 Prediabetes: Secondary | ICD-10-CM | POA: Diagnosis not present

## 2015-02-13 DIAGNOSIS — Z23 Encounter for immunization: Secondary | ICD-10-CM

## 2015-02-13 DIAGNOSIS — I10 Essential (primary) hypertension: Secondary | ICD-10-CM

## 2015-02-13 DIAGNOSIS — Z1331 Encounter for screening for depression: Secondary | ICD-10-CM

## 2015-02-13 DIAGNOSIS — E782 Mixed hyperlipidemia: Secondary | ICD-10-CM | POA: Diagnosis not present

## 2015-02-13 DIAGNOSIS — Z789 Other specified health status: Secondary | ICD-10-CM | POA: Diagnosis not present

## 2015-02-13 DIAGNOSIS — Z6820 Body mass index (BMI) 20.0-20.9, adult: Secondary | ICD-10-CM

## 2015-02-13 DIAGNOSIS — E559 Vitamin D deficiency, unspecified: Secondary | ICD-10-CM

## 2015-02-13 DIAGNOSIS — Z9181 History of falling: Secondary | ICD-10-CM

## 2015-02-13 DIAGNOSIS — Z1389 Encounter for screening for other disorder: Secondary | ICD-10-CM

## 2015-02-13 DIAGNOSIS — Z79899 Other long term (current) drug therapy: Secondary | ICD-10-CM | POA: Diagnosis not present

## 2015-02-13 DIAGNOSIS — R7309 Other abnormal glucose: Secondary | ICD-10-CM | POA: Diagnosis not present

## 2015-02-13 DIAGNOSIS — Z6825 Body mass index (BMI) 25.0-25.9, adult: Secondary | ICD-10-CM | POA: Insufficient documentation

## 2015-02-13 DIAGNOSIS — Z6823 Body mass index (BMI) 23.0-23.9, adult: Secondary | ICD-10-CM | POA: Insufficient documentation

## 2015-02-13 LAB — MAGNESIUM: Magnesium: 2 mg/dL (ref 1.5–2.5)

## 2015-02-13 LAB — CBC WITH DIFFERENTIAL/PLATELET
Basophils Absolute: 0 10*3/uL (ref 0.0–0.1)
Basophils Relative: 0 % (ref 0–1)
Eosinophils Absolute: 0.1 10*3/uL (ref 0.0–0.7)
Eosinophils Relative: 1 % (ref 0–5)
HCT: 47.9 % (ref 39.0–52.0)
Hemoglobin: 16.4 g/dL (ref 13.0–17.0)
Lymphocytes Relative: 25 % (ref 12–46)
Lymphs Abs: 1.7 10*3/uL (ref 0.7–4.0)
MCH: 30.5 pg (ref 26.0–34.0)
MCHC: 34.2 g/dL (ref 30.0–36.0)
MCV: 89.2 fL (ref 78.0–100.0)
MPV: 11.2 fL (ref 8.6–12.4)
Monocytes Absolute: 0.7 10*3/uL (ref 0.1–1.0)
Monocytes Relative: 10 % (ref 3–12)
Neutro Abs: 4.2 10*3/uL (ref 1.7–7.7)
Neutrophils Relative %: 64 % (ref 43–77)
Platelets: 173 10*3/uL (ref 150–400)
RBC: 5.37 MIL/uL (ref 4.22–5.81)
RDW: 13.7 % (ref 11.5–15.5)
WBC: 6.6 10*3/uL (ref 4.0–10.5)

## 2015-02-13 LAB — HEPATIC FUNCTION PANEL
ALT: 16 U/L (ref 9–46)
AST: 18 U/L (ref 10–35)
Albumin: 4 g/dL (ref 3.6–5.1)
Alkaline Phosphatase: 57 U/L (ref 40–115)
Bilirubin, Direct: 0.1 mg/dL (ref <=0.2)
Indirect Bilirubin: 0.7 mg/dL (ref 0.2–1.2)
Total Bilirubin: 0.8 mg/dL (ref 0.2–1.2)
Total Protein: 6.4 g/dL (ref 6.1–8.1)

## 2015-02-13 LAB — BASIC METABOLIC PANEL WITH GFR
BUN: 15 mg/dL (ref 7–25)
CO2: 26 mmol/L (ref 20–31)
Calcium: 9 mg/dL (ref 8.6–10.3)
Chloride: 104 mmol/L (ref 98–110)
Creat: 0.82 mg/dL (ref 0.70–1.18)
GFR, Est African American: >89 mL/min (ref >=60)
GFR, Est Non African American: 84 mL/min (ref >=60)
Glucose, Bld: 62 mg/dL — ABNORMAL LOW (ref 65–99)
Potassium: 4.4 mmol/L (ref 3.5–5.3)
Sodium: 141 mmol/L (ref 135–146)

## 2015-02-13 LAB — TSH: TSH: 0.96 u[IU]/mL (ref 0.350–4.500)

## 2015-02-13 LAB — LIPID PANEL
Cholesterol: 174 mg/dL (ref 125–200)
HDL: 38 mg/dL — ABNORMAL LOW (ref >=40)
LDL Cholesterol: 112 mg/dL (ref <130)
Total CHOL/HDL Ratio: 4.6 Ratio (ref <=5.0)
Triglycerides: 119 mg/dL (ref <150)
VLDL: 24 mg/dL (ref <30)

## 2015-02-13 LAB — HEMOGLOBIN A1C
Hgb A1c MFr Bld: 5.6 % (ref <5.7)
Mean Plasma Glucose: 114 mg/dL (ref <117)

## 2015-02-13 NOTE — Patient Instructions (Signed)

## 2015-02-13 NOTE — Progress Notes (Signed)
Patient ID: Matthew Aguirre, male   DOB: September 13, 1933, 81 y.o.   MRN: [id number]   This very nice 81 y.o. MWM presents for 6 month follow up with Hypertension, Hyperlipidemia, Pre-Diabetes and Vitamin D Deficiency. Matthew Aguirre has remote hx/o Pulmonary DSarcoid.    Patient is treated for HTN since 2003 & BP has been controlled at home. Today's BP: 140/84 mmHg. Patient has had no complaints of any cardiac type chest pain, palpitations, dyspnea/orthopnea/PND, dizziness, claudication, or dependent edema.   Hyperlipidemia is controlled with diet & patient had been intolerant in the past to Pravastatin & Zetyia. . Patient denies myalgias or other med SE's. Current Lipids are not at goal with Cholesterol 174; HDL 38*; LDL 112; Triglycerides 119.    Also, the patient has history of PreDiabetes and has had no symptoms of reactive hypoglycemia, diabetic polys, paresthesias or visual blurring.  Current  A1c is at goal with A1c of 5.6%.    Further, the patient also has history of Vitamin D Deficiency of 41 on supplements in 2008 and supplements vitamin D without any suspected side-effects. Last vitamin D was 75 on  11/01/2014.  Medication Sig  . atenolol  50 MG tablet TAKE 1 TABLET EVERY DAY FOR BLOOD PRESSURE  . BABY ASPIRIN PO Take 81 mg by mouth daily.  Marland Kitchen VITAMIN D  Take 2,000 Int'l Units by mouth 2 (two) times daily.   . finasteride  5 MG tablet TAKE 1 TABLET EVERY DAY FOR BLOOD PRESSURE AND PROSTATE  . FLAX SEED OIL  Take 1,200 mg by mouth 3 (three) times daily.   . Magnesium 250 MG TABS Take 250 mg by mouth daily.  . Omega-3 FISH OIL  Take 1,000 mg by mouth 2 (two) times daily.    Allergies  Allergen Reactions  . Pravastatin   . Red Yeast Rice [Cholestin]   . Sulfa Antibiotics   . Vibramycin [Doxycycline Calcium]   . Zetia [Ezetimibe]    PMHx:   Past Medical History  Diagnosis Date  . Vitamin D deficiency 07/13/2013  . Prediabetes 07/13/2013  . Hyperlipidemia 07/13/2013  . Essential hypertension  02/15/2007   Immunization History  Administered Date(s) Administered  . Influenza, High Dose Seasonal PF 01/23/2014, 02/13/2015  . Pneumococcal Conjugate-13 10/12/2013   FHx:    Reviewed / unchanged  SHx:    Reviewed / unchanged  Systems Review:  Constitutional: Denies fever, chills, wt changes, headaches, insomnia, fatigue, night sweats, change in appetite. Eyes: Denies redness, blurred vision, diplopia, discharge, itchy, watery eyes.  ENT: Denies discharge, congestion, post nasal drip, epistaxis, sore throat, earache, hearing loss, dental pain, tinnitus, vertigo, sinus pain, snoring.  CV: Denies chest pain, palpitations, irregular heartbeat, syncope, dyspnea, diaphoresis, orthopnea, PND, claudication or edema. Respiratory: denies cough, dyspnea, DOE, pleurisy, hoarseness, laryngitis, wheezing.  Gastrointestinal: Denies dysphagia, odynophagia, heartburn, reflux, water brash, abdominal pain or cramps, nausea, vomiting, bloating, diarrhea, constipation, hematemesis, melena, hematochezia  or hemorrhoids. Genitourinary: Denies dysuria, frequency, urgency, nocturia, hesitancy, discharge, hematuria or flank pain. Musculoskeletal: Denies arthralgias, myalgias, stiffness, jt. swelling, pain, limping or strain/sprain.  Skin: Denies pruritus, rash, hives, warts, acne, eczema or change in skin lesion(s). Neuro: No weakness, tremor, incoordination, spasms, paresthesia or pain. Psychiatric: Denies confusion, memory loss or sensory loss. Endo: Denies change in weight, skin or hair change.  Heme/Lymph: No excessive bleeding, bruising or enlarged lymph nodes.  Physical Exam  BP 140/84 mmHg  Pulse 60  Temp(Src) 97 F (36.1 C)  Resp 16  Ht 5' 6.75" (1.695 m)  Wt 131 lb 12.8 oz (59.784 kg)  BMI 20.81 kg/m2  Appears well nourished and in no distress. Eyes: PERRLA, EOMs, conjunctiva no swelling or erythema. Sinuses: No frontal/maxillary tenderness ENT/Mouth: EAC's clear, TM's nl w/o erythema,  bulging. Nares clear w/o erythema, swelling, exudates. Oropharynx clear without erythema or exudates. Oral hygiene is good. Tongue normal, non obstructing. Hearing intact.  Neck: Supple. Thyroid nl. Car 2+/2+ without bruits, nodes or JVD. Chest: Respirations nl with BS clear & equal w/o rales, rhonchi, wheezing or stridor.  Cor: Heart sounds normal w/ regular rate and rhythm without sig. murmurs, gallops, clicks, or rubs. Peripheral pulses normal and equal  without edema.  Abdomen: Soft & bowel sounds normal. Non-tender w/o guarding, rebound, hernias, masses, or organomegaly.  Lymphatics: Unremarkable.  Musculoskeletal: Full ROM all peripheral extremities, joint stability, 5/5 strength, and normal gait.  Skin: Warm, dry without exposed rashes, lesions or ecchymosis apparent.  Neuro: Cranial nerves intact, reflexes equal bilaterally. Sensory-motor testing grossly intact. Tendon reflexes grossly intact.  Pysch: Alert & oriented x 3.  Insight and judgement nl & appropriate. No ideations.  Assessment and Plan:  1. Essential hypertension  - TSH  2. Hyperlipidemia  - Lipid panel - TSH  3. Prediabetes  - Hemoglobin A1c - Insulin, random  4. Vitamin D deficiency  - VITAMIN D 25 Hydroxy   5. Other abnormal glucose  - Hemoglobin A1c - Insulin, random  6. BPH (benign prostatic hypertrophy)   7. Need for prophylactic vaccination and inoculation against influenza  - Flu vaccine HIGH DOSE PF (Fluzone High dose)  8. Body mass index (BMI) of 20.0-20.9 in adult   9. Medication management  - CBC with Differential/Platelet - BASIC METABOLIC PANEL WITH GFR - Hepatic function panel - Magnesium  10. At low risk for fall   11. Depression screen   Recommended regular exercise, BP monitoring, weight control, and discussed med and SE's. Recommended labs to assess and monitor clinical status. Further disposition pending results of labs. Over 30 minutes of exam, counseling, chart review  was performed

## 2015-02-14 LAB — INSULIN, RANDOM: Insulin: 3.9 u[IU]/mL (ref 2.0–19.6)

## 2015-02-14 LAB — VITAMIN D 25 HYDROXY (VIT D DEFICIENCY, FRACTURES): Vit D, 25-Hydroxy: 76 ng/mL (ref 30–100)

## 2015-03-20 DIAGNOSIS — C61 Malignant neoplasm of prostate: Secondary | ICD-10-CM | POA: Diagnosis not present

## 2015-03-27 ENCOUNTER — Other Ambulatory Visit: Payer: Self-pay | Admitting: Urology

## 2015-03-27 DIAGNOSIS — Z0000 Encounter for general adult medical examination without abnormal findings: Secondary | ICD-10-CM | POA: Diagnosis not present

## 2015-03-27 DIAGNOSIS — C61 Malignant neoplasm of prostate: Secondary | ICD-10-CM

## 2015-04-08 ENCOUNTER — Other Ambulatory Visit: Payer: Self-pay | Admitting: *Deleted

## 2015-04-08 MED ORDER — ATENOLOL 50 MG PO TABS: ORAL_TABLET | ORAL | Status: DC

## 2015-04-08 MED ORDER — FINASTERIDE 5 MG PO TABS: ORAL_TABLET | ORAL | Status: DC

## 2015-04-10 ENCOUNTER — Ambulatory Visit (HOSPITAL_COMMUNITY)
Admission: RE | Admit: 2015-04-10 | Discharge: 2015-04-10 | Disposition: A | Discharge status: Home / Self Care | Payer: Medicare Other | Source: Ambulatory Visit | Attending: Urology | Admitting: Urology

## 2015-04-10 DIAGNOSIS — C61 Malignant neoplasm of prostate: Secondary | ICD-10-CM | POA: Insufficient documentation

## 2015-04-10 DIAGNOSIS — R972 Elevated prostate specific antigen [PSA]: Secondary | ICD-10-CM | POA: Diagnosis not present

## 2015-04-10 LAB — POCT I-STAT CREATININE: Creatinine, Ser: 1 mg/dL (ref 0.61–1.24)

## 2015-04-10 MED ORDER — GADOBENATE DIMEGLUMINE 529 MG/ML IV SOLN
15.0000 mL | Freq: Once | INTRAVENOUS | Status: AC | PRN
  Administered 2015-04-10: 11 mL via INTRAVENOUS

## 2015-05-17 ENCOUNTER — Ambulatory Visit (INDEPENDENT_AMBULATORY_CARE_PROVIDER_SITE_OTHER): Payer: Medicare Other | Admitting: Internal Medicine

## 2015-05-17 ENCOUNTER — Encounter: Payer: Self-pay | Admitting: Internal Medicine

## 2015-05-17 VITALS — BP 158/82 | HR 66 | Temp 97.8°F | Resp 16 | Ht 66.5 in | Wt 135.0 lb

## 2015-05-17 DIAGNOSIS — E782 Mixed hyperlipidemia: Secondary | ICD-10-CM

## 2015-05-17 DIAGNOSIS — I10 Essential (primary) hypertension: Secondary | ICD-10-CM | POA: Diagnosis not present

## 2015-05-17 DIAGNOSIS — Z79899 Other long term (current) drug therapy: Secondary | ICD-10-CM

## 2015-05-17 DIAGNOSIS — Z23 Encounter for immunization: Secondary | ICD-10-CM | POA: Diagnosis not present

## 2015-05-17 DIAGNOSIS — E559 Vitamin D deficiency, unspecified: Secondary | ICD-10-CM

## 2015-05-17 DIAGNOSIS — R972 Elevated prostate specific antigen [PSA]: Secondary | ICD-10-CM

## 2015-05-17 DIAGNOSIS — Z0000 Encounter for general adult medical examination without abnormal findings: Secondary | ICD-10-CM

## 2015-05-17 DIAGNOSIS — D860 Sarcoidosis of lung: Secondary | ICD-10-CM | POA: Diagnosis not present

## 2015-05-17 DIAGNOSIS — Z6820 Body mass index (BMI) 20.0-20.9, adult: Secondary | ICD-10-CM | POA: Diagnosis not present

## 2015-05-17 DIAGNOSIS — N400 Enlarged prostate without lower urinary tract symptoms: Secondary | ICD-10-CM | POA: Diagnosis not present

## 2015-05-17 DIAGNOSIS — R911 Solitary pulmonary nodule: Secondary | ICD-10-CM | POA: Diagnosis not present

## 2015-05-17 DIAGNOSIS — R6889 Other general symptoms and signs: Secondary | ICD-10-CM | POA: Diagnosis not present

## 2015-05-17 DIAGNOSIS — R7303 Prediabetes: Secondary | ICD-10-CM

## 2015-05-17 DIAGNOSIS — Z0001 Encounter for general adult medical examination with abnormal findings: Secondary | ICD-10-CM | POA: Diagnosis not present

## 2015-05-17 DIAGNOSIS — R7309 Other abnormal glucose: Secondary | ICD-10-CM | POA: Diagnosis not present

## 2015-05-17 NOTE — Progress Notes (Signed)
MEDICARE ANNUAL WELLNESS VISIT AND FOLLOW UP Assessment:    1. Essential hypertension -cont meds -dash diet -monitor at home -cont exercise  2. Hyperlipidemia -monitor with labs -currently controlled by diet  3. Prediabetes -cont diet and exercise  4. Vitamin D deficiency -cont supplement  5. Medication management -labs at next visit  6. Need for prophylactic vaccination with combined diphtheria-tetanus-pertussis (DTP) vaccine  - DT Vaccine greater than 80yo IM  7. Need for prophylactic vaccination against Streptococcus pneumoniae (pneumococcus)  - Pneumococcal polysaccharide vaccine 23-valent greater than or equal to 2yo subcutaneous/IM  8. Pulmonary sarcoidosis (1988)  -followed by pulmonary  9. BPH (benign prostatic hypertrophy) -followed by urology  10. Pulmonary nodule (2008 resolved on f/u)  -resolved  11. Elevated PSA -BPH -followed by urology  12. Other abnormal glucose -diet and exercise  13. Body mass index (BMI) of 20.0-20.9 in adult -well controlled in normal range  14. Routine general medical examination at a health care facility -due next year     Over 30 minutes of exam, counseling, chart review, and critical decision making was performed  Plan:   During the course of the visit the patient was educated and counseled about appropriate screening and preventive services including:    Pneumococcal vaccine   Influenza vaccine  Prevnar 13  Td vaccine  Screening electrocardiogram  Colorectal cancer screening  Diabetes screening  Glaucoma screening  Nutrition counseling   Conditions/risks identified: BMI: Discussed weight loss, diet, and increase physical activity.  Increase physical activity: AHA recommends 150 minutes of physical activity a week.  Medications reviewed Diabetes is at goal, ACE/ARB therapy: No, Reason not on Ace Inhibitor/ARB therapy:  not indicated Urinary Incontinence is not an issue: discussed non  pharmacology and pharmacology options.  Fall risk: low- discussed PT, home fall assessment, medications.    Subjective:  Matthew Aguirre is a 82 y.o. male who presents for Medicare Annual Wellness Visit and 3 month follow up for HTN, hyperlipidemia, prediabetes, and vitamin D Def.  Date of last medicare wellness visit was is unknown.  His blood pressure has been controlled at home, today their BP is BP: (!) 158/82 mmHg He does workout. He denies chest pain, shortness of breath, dizziness.  He is not on cholesterol medication and denies myalgias. His cholesterol is at goal. The cholesterol last visit was:   Lab Results  Component Value Date   CHOL 174 02/13/2015   HDL 38* 02/13/2015   LDLCALC 112 02/13/2015   TRIG 119 02/13/2015   CHOLHDL 4.6 02/13/2015   He has been working on diet and exercise for prediabetes, and denies foot ulcerations, hyperglycemia, hypoglycemia , increased appetite, nausea, paresthesia of the feet, polydipsia, polyuria, visual disturbances, vomiting and weight loss. Last A1C in the office was:  Lab Results  Component Value Date   HGBA1C 5.6 02/13/2015   Last GFR NonAA   Lab Results  Component Value Date   [facility] 84 02/13/2015   AA  Lab Results  Component Value Date   GFRAA >89 02/13/2015   Patient is on Vitamin D supplement.   Lab Results  Component Value Date   VD25OH 76 02/13/2015     He did see Dr. Junious Silk and he has had an MRI recently and that was normal.  He is due to go back and see him in 6 months.     Medication Review: Current Outpatient Prescriptions on File Prior to Visit  Medication Sig Dispense Refill  . atenolol (TENORMIN) 50 MG tablet TAKE  1 TABLET EVERY DAY FOR BLOOD PRESSURE 90 tablet 3  . BABY ASPIRIN PO Take 81 mg by mouth daily.    . Cholecalciferol (VITAMIN D PO) Take 2,000 Int'l Units by mouth 2 (two) times daily.     . finasteride (PROSCAR) 5 MG tablet TAKE 1 TABLET EVERY DAY FOR BLOOD PRESSURE AND PROSTATE 90  tablet 3  . Flaxseed, Linseed, (FLAX SEED OIL PO) Take 1,200 mg by mouth 3 (three) times daily.     . Magnesium 250 MG TABS Take 250 mg by mouth daily.    . Omega-3 Fatty Acids (FISH OIL PO) Take 1,000 mg by mouth 2 (two) times daily.      No current facility-administered medications on file prior to visit.    Current Problems (verified) Patient Active Problem List   Diagnosis Date Noted  . Other abnormal glucose 02/13/2015  . Body mass index (BMI) of 20.0-20.9 in adult 02/13/2015  . Elevated PSA 07/30/2014  . 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 BPH (benign prostatic hypertrophy) 04/19/2014  . Hyperlipidemia 07/13/2013  . Prediabetes 07/13/2013  . Vitamin D deficiency 07/13/2013  . Medication management 07/13/2013  . Essential hypertension 02/15/2007  . Pulmonary nodule (2008 resolved on f/u)  02/15/2007    Screening Tests Immunization History  Administered Date(s) Administered  . Influenza, High Dose Seasonal PF 01/23/2014, 02/13/2015  . Pneumococcal Conjugate-13 10/12/2013    Preventative care: Last colonoscopy: 2014, declines further screenings  Prior vaccinations: TD or Tdap: 2005, due today  Influenza: 2016  Pneumococcal: Given today Prevnar13: 2015 Shingles/Zostavax: Will call insurance company about it  Names of Other Physician/Practitioners you currently use: 1. [facility] here for primary care 2. Dr. Gershon Crane, eye doctor, last visit 2016 3. Dr. Orvil Feil, dentist, last visit 2017 Patient Care Team: Unk Pinto, MD as PCP - General (Internal Medicine) Festus Aloe, MD as Consulting Physician (Urology) Inda Castle, MD as Consulting Physician (Gastroenterology) Rutherford Guys, MD as Consulting Physician (Ophthalmology)  No past surgical history on file. No family history on file. Social History  Substance Use Topics  . Smoking status: Never Smoker   . Smokeless tobacco: None  . Alcohol Use: No     MEDICARE WELLNESS OBJECTIVES: Tobacco use: He does not smoke.  Patient is not a former smoker. If yes, counseling given Alcohol Current alcohol use: none Osteoporosis: hypogonadism, History of fracture in the past year: no Fall risk: Low Risk Hearing: normal Visual acuity: normal,  does perform annual eye exam Diet: well balanced Physical activity:   Cardiac risk factors:   Depression/mood screen:   Depression screen PHQ 2/9 02/13/2015  Decreased Interest 0  Down, Depressed, Hopeless 0  PHQ - 2 Score 0    ADLs:  In your present state of health, do you have any difficulty performing the following activities: 02/13/2015 07/29/2014  Hearing? N N  Vision? N N  Difficulty concentrating or making decisions? N N  Walking or climbing stairs? N N  Dressing or bathing? N N  Doing errands, shopping? N N     Cognitive Testing  Alert? Yes  Normal Appearance?Yes  Oriented to person? Yes  Place? Yes   Time? Yes  Recall of three objects?  Yes  Can perform simple calculations? Yes  Displays appropriate judgment?Yes  Can read the correct time from a watch face?Yes  EOL planning:     Objective:   Today's Vitals   05/17/15 1048  BP: 158/82  Pulse: 66  Temp: 97.8  F (36.6 C)  TempSrc: Temporal  Resp: 16  Height: 5' 6.5" (1.689 m)  Weight: 135 lb (61.236 kg)   Body mass index is 21.47 kg/(m^2).  General appearance: alert, no distress, WD/WN, male HEENT: normocephalic, sclerae anicteric, TMs pearly, nares patent, no discharge or erythema, pharynx normal Oral cavity: MMM, no lesions Neck: supple, no lymphadenopathy, no thyromegaly, no masses Heart: RRR, normal S1, S2, no murmurs Lungs: CTA bilaterally, no wheezes, rhonchi, or rales Abdomen: +bs, soft, non tender, non distended, no masses, no hepatomegaly, no splenomegaly Musculoskeletal: nontender, no swelling, no obvious deformity Extremities: no edema, no cyanosis, no clubbing Pulses: 2+ symmetric, upper and lower  extremities, normal cap refill Neurological: alert, oriented x 3, CN2-12 intact, strength normal upper extremities and lower extremities, sensation normal throughout, DTRs 2+ throughout, no cerebellar signs, gait normal Psychiatric: normal affect, behavior normal, pleasant   Medicare Attestation I have personally reviewed: The patient's medical and social history Their use of alcohol, tobacco or illicit drugs Their current medications and supplements The patient's functional ability including ADLs,fall risks, home safety risks, cognitive, and hearing and visual impairment Diet and physical activities Evidence for depression or mood disorders  The patient's weight, height, BMI, and visual acuity have been recorded in the chart.  I have made referrals, counseling, and provided education to the patient based on review of the above and I have provided the patient with a written personalized care plan for preventive services.     Starlyn Skeans, PA-C   05/17/2015

## 2015-08-13 ENCOUNTER — Encounter: Payer: Self-pay | Admitting: Internal Medicine

## 2015-09-19 DIAGNOSIS — C61 Malignant neoplasm of prostate: Secondary | ICD-10-CM | POA: Diagnosis not present

## 2015-09-25 DIAGNOSIS — N400 Enlarged prostate without lower urinary tract symptoms: Secondary | ICD-10-CM | POA: Diagnosis not present

## 2015-09-25 DIAGNOSIS — C61 Malignant neoplasm of prostate: Secondary | ICD-10-CM | POA: Diagnosis not present

## 2015-09-29 ENCOUNTER — Encounter: Payer: Self-pay | Admitting: Internal Medicine

## 2015-09-29 NOTE — Progress Notes (Signed)
Patient ID: Matthew Aguirre, male   DOB: 06/26/1933, 82 y.o.   MRN: [id number]  [facility] ADULT & ADOLESCENT INTERNAL MEDICINE   Unk Pinto, M.D.    [name], P.A.-C      Starlyn Skeans, P.A.-C   [facility]                [address] [place], [place] [id number] Telephone [phone] Telefax [phone] _________________________________  Comprehensive Evaluation & Examination     This very nice 82 y.o. MWM presents for a comprehensive evaluation and management of multiple medical co-morbidities.  Patient has been followed for HTN, Prediabetes, Hyperlipidemia and Vitamin D Deficiency.     HTN predates since 2003. Patient's BP has been controlled at home.Today's BP is  132/72 mmHg. Patient denies any cardiac symptoms as chest pain, palpitations, shortness of breath, dizziness or ankle swelling. Patient also dx'd with Gleason 6 Prostate Cancer in Apr 2014 & is followed by active surveillance on Finasteride by by Dr Junious Silk and had PSA last week.      Patient's hyperlipidemia is not controlled with diet. Patient prefers to avoid cholesterol medicatons. Last lipids were not at goal with Cholesterol 174; HDL 38*; elevated LDL 112; Triglycerides 119 on 02/13/2015.     Patient has prediabetes since 2008 with an A1c 5.8% and patient denies reactive hypoglycemic symptoms, visual blurring, diabetic polys or paresthesias. Last A1c was at goal with A1c 5.6% on 02/13/2015.      Finally, patient has history of Vitamin D Deficiency of "57" on treatment in 2008 and last vitamin D was 76 on 02/13/2015.  Medication Sig  . atenolol 50 MG  TAKE 1 TABLET EVERY DAY FOR BLOOD PRESSURE  . bASA  81 mg Take  by mouth daily.  Marland Kitchen VITAMIN D Take 2,000 Int'l Units by mouth 2 (two) times daily.   . finasteride 5 MG t TAKE 1 TABLET EVERY DAY FOR BLOOD PRESSURE AND PROSTATE  . FLAX SEED OIL  Take 1,200 mg by mouth 3 (three) times daily.   . Magnesium  250 MG  Take 250 mg by mouth daily.  Marland Kitchen FISH OIL Take 1,000 mg by mouth 2 (two) times daily.    Allergies  Allergen Reactions  . Pravastatin   . Red Yeast Rice [Cholestin]   . Sulfa Antibiotics   . Vibramycin [Doxycycline Calcium]   . Zetia [Ezetimibe]    Past Medical History  Diagnosis Date  . Vitamin D deficiency 07/13/2013  . Prediabetes 07/13/2013  . Hyperlipidemia 07/13/2013  . Essential hypertension 02/15/2007   Health Maintenance  Topic Date Due  . TETANUS/TDAP  03/29/1954  . ZOSTAVAX  03/30/1995  . INFLUENZA VACCINE  10/15/2015  . PNA vac Low Risk Adult  Completed   Immunization History  Administered Date(s) Administered  . DT 05/17/2015  . Influenza, High Dose Seasonal PF 01/23/2014, 02/13/2015  . Pneumococcal Conjugate-13 10/12/2013  . Pneumococcal Polysaccharide-23 05/17/2015   No past surgical history on file.  Social History   Social History  . Marital Status: Married    Spouse Name: N/A  . Number of Children: N/A  . Years of Education: N/A   Occupational History  . 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 Smoking status: Never Smoker   . Smokeless tobacco: Not on file  . Alcohol Use: No  . Drug Use:  Not on file  . Sexual Activity: Not on file    ROS Constitutional: Denies fever, chills, weight loss/gain, headaches, insomnia,  night sweats or change in appetite. Does c/o fatigue. Eyes: Denies redness, blurred vision, diplopia, discharge, itchy or watery eyes.  ENT: Denies discharge, congestion, post nasal drip, epistaxis, sore throat, earache, hearing loss, dental pain, Tinnitus, Vertigo, Sinus pain or snoring.  Cardio: Denies chest pain, palpitations, irregular heartbeat, syncope, dyspnea, diaphoresis, orthopnea, PND, claudication or edema Respiratory: denies cough, dyspnea, DOE, pleurisy, hoarseness, laryngitis or wheezing.  Gastrointestinal: Denies dysphagia, heartburn, reflux, water brash, pain, cramps, nausea,  vomiting, bloating, diarrhea, constipation, hematemesis, melena, hematochezia, jaundice or hemorrhoids Genitourinary: Denies dysuria, frequency, urgency, nocturia, hesitancy, discharge, hematuria or flank pain Musculoskeletal: Denies arthralgia, myalgia, stiffness, Jt. Swelling, pain, limp or strain/sprain. Denies Falls. Skin: Denies puritis, rash, hives, warts, acne, eczema or change in skin lesion Neuro: No weakness, tremor, incoordination, spasms, paresthesia or pain Psychiatric: Denies confusion, memory loss or sensory loss. Denies Depression. Endocrine: Denies change in weight, skin, hair change, nocturia, and paresthesia, diabetic polys, visual blurring or hyper / hypo glycemic episodes.  Heme/Lymph: No excessive bleeding, bruising or enlarged lymph nodes.  Physical Exam  BP 132/72   Pulse 72  Temp  97.9 F   Resp 16  Ht 5' 6.5"   Wt 130 lb 12.8 oz     BMI 20.80   General Appearance: Well nourished, in no apparent distress. Eyes: PERRLA, EOMs, conjunctiva no swelling or erythema, normal fundi and vessels. Sinuses: No frontal/maxillary tenderness ENT/Mouth: EACs patent / TMs  nl. Nares clear without erythema, swelling, mucoid exudates. Oral hygiene is good. No erythema, swelling, or exudate. Tongue normal, non-obstructing. Tonsils not swollen or erythematous. Hearing normal.  Neck: Supple, thyroid normal. No bruits, nodes or JVD. Respiratory: Respiratory effort normal.  BS equal and clear bilateral without rales, rhonci, wheezing or stridor. Cardio: Heart sounds are normal with regular rate and rhythm and no murmurs, rubs or gallops. Peripheral pulses are normal and equal bilaterally without edema. No aortic or femoral bruits. Chest: symmetric with normal excursions and percussion.  Abdomen: Soft, with Nl bowel sounds. Nontender, no guarding, rebound, hernias, masses, or organomegaly.  Lymphatics: Non tender without lymphadenopathy.  Genitourinary:  Deferred to Dr  Junious Silk. Musculoskeletal: Full ROM all peripheral extremities, joint stability, 5/5 strength, and normal gait. Skin: Warm and dry without rashes, lesions, cyanosis, clubbing or  ecchymosis.  Neuro: Cranial nerves intact, reflexes equal bilaterally. Normal muscle tone, no cerebellar symptoms. Sensation intact.  Pysch: Alert and oriented X 3 with normal affect, insight and judgment appropriate.   Assessment and Plan  1. Essential hypertension  - Continue medication, monitor blood pressure at home. Continue DASH diet. Reminder to go to the ER if any CP, SOB, nausea, dizziness, severe HA, changes vision/speech, left arm numbness and tingling and jaw pain. - Microalbumin / creatinine urine ratio - EKG 12-Lead - [place], RETROPERITNL ABD,  LTD - TSH  2. Hyperlipidemia  - Continue diet/meds, exercise,& lifestyle modifications. Continue monitor periodic cholesterol/liver & renal functions  - [place], RETROPERITNL ABD,  LTD - Lipid panel - TSH  3. Prediabetes  - Continue diet, exercise, lifestyle modifications. Monitor appropriate labs. - [place], RETROPERITNL ABD,  LTD - Hemoglobin A1c - Insulin, random  4. Vitamin D deficiency  - VITAMIN D 25 Hydroxy  5. Other abnormal glucose  - Continue supplementation.  6. Elevated PSA  - Followed at Urology  7. Prostate cancer screening  - Followed at Urology  8.  Screening for rectal cancer  - POC Hemoccult Bld/Stl  9. Screening for AAA (aortic abdominal aneurysm)  - [place], RETROPERITNL ABD,  LTD  10. Screening for ischemic heart disease  - EKG 12-Lead  11. Medication management  - CBC with Differential/Platelet - BASIC METABOLIC PANEL WITH GFR - Hepatic function panel - Magnesium   Continue prudent diet as discussed, weight control, BP monitoring, regular exercise, and medications as discussed.  Discussed med effects and SE's. Routine screening labs and tests as requested with regular follow-up as recommended. Over 40 minutes of exam,  counseling, chart review and high complex critical decision making was performed

## 2015-09-29 NOTE — Patient Instructions (Signed)

## 2015-09-30 ENCOUNTER — Ambulatory Visit (INDEPENDENT_AMBULATORY_CARE_PROVIDER_SITE_OTHER): Payer: Medicare Other | Admitting: Internal Medicine

## 2015-09-30 ENCOUNTER — Encounter: Payer: Self-pay | Admitting: Internal Medicine

## 2015-09-30 VITALS — BP 132/72 | HR 72 | Temp 97.9°F | Resp 16 | Ht 66.5 in | Wt 130.8 lb

## 2015-09-30 DIAGNOSIS — E782 Mixed hyperlipidemia: Secondary | ICD-10-CM

## 2015-09-30 DIAGNOSIS — I10 Essential (primary) hypertension: Secondary | ICD-10-CM

## 2015-09-30 DIAGNOSIS — R7309 Other abnormal glucose: Secondary | ICD-10-CM

## 2015-09-30 DIAGNOSIS — R7303 Prediabetes: Secondary | ICD-10-CM

## 2015-09-30 DIAGNOSIS — Z136 Encounter for screening for cardiovascular disorders: Secondary | ICD-10-CM

## 2015-09-30 DIAGNOSIS — Z125 Encounter for screening for malignant neoplasm of prostate: Secondary | ICD-10-CM

## 2015-09-30 DIAGNOSIS — Z79899 Other long term (current) drug therapy: Secondary | ICD-10-CM

## 2015-09-30 DIAGNOSIS — Z1212 Encounter for screening for malignant neoplasm of rectum: Secondary | ICD-10-CM

## 2015-09-30 DIAGNOSIS — E559 Vitamin D deficiency, unspecified: Secondary | ICD-10-CM | POA: Diagnosis not present

## 2015-09-30 DIAGNOSIS — R972 Elevated prostate specific antigen [PSA]: Secondary | ICD-10-CM | POA: Diagnosis not present

## 2015-09-30 LAB — CBC WITH DIFFERENTIAL/PLATELET
Basophils Absolute: 0 cells/uL (ref 0–200)
Basophils Relative: 0 %
Eosinophils Absolute: 0 cells/uL — ABNORMAL LOW (ref 15–500)
Eosinophils Relative: 0 %
HCT: 48.2 % (ref 38.5–50.0)
Hemoglobin: 16.2 g/dL (ref 13.2–17.1)
Lymphocytes Relative: 17 %
Lymphs Abs: 1139 cells/uL (ref 850–3900)
MCH: 30.6 pg (ref 27.0–33.0)
MCHC: 33.6 g/dL (ref 32.0–36.0)
MCV: 91.1 fL (ref 80.0–100.0)
MPV: 11.7 fL (ref 7.5–12.5)
Monocytes Absolute: 603 cells/uL (ref 200–950)
Monocytes Relative: 9 %
Neutro Abs: 4958 cells/uL (ref 1500–7800)
Neutrophils Relative %: 74 %
Platelets: 200 10*3/uL (ref 140–400)
RBC: 5.29 MIL/uL (ref 4.20–5.80)
RDW: 13.4 % (ref 11.0–15.0)
WBC: 6.7 10*3/uL (ref 3.8–10.8)

## 2015-09-30 LAB — HEPATIC FUNCTION PANEL
ALT: 15 U/L (ref 9–46)
AST: 17 U/L (ref 10–35)
Albumin: 4.3 g/dL (ref 3.6–5.1)
Alkaline Phosphatase: 61 U/L (ref 40–115)
Bilirubin, Direct: 0.2 mg/dL (ref <=0.2)
Indirect Bilirubin: 0.6 mg/dL (ref 0.2–1.2)
Total Bilirubin: 0.8 mg/dL (ref 0.2–1.2)
Total Protein: 6.7 g/dL (ref 6.1–8.1)

## 2015-09-30 LAB — BASIC METABOLIC PANEL WITH GFR
BUN: 18 mg/dL (ref 7–25)
CO2: 27 mmol/L (ref 20–31)
Calcium: 9.5 mg/dL (ref 8.6–10.3)
Chloride: 107 mmol/L (ref 98–110)
Creat: 1.12 mg/dL — ABNORMAL HIGH (ref 0.70–1.11)
GFR, Est African American: 71 mL/min (ref >=60)
GFR, Est Non African American: 62 mL/min (ref >=60)
Glucose, Bld: 89 mg/dL (ref 65–99)
Potassium: 4.2 mmol/L (ref 3.5–5.3)
Sodium: 144 mmol/L (ref 135–146)

## 2015-09-30 LAB — HEMOGLOBIN A1C
Hgb A1c MFr Bld: 5.6 % (ref <5.7)
Mean Plasma Glucose: 114 mg/dL

## 2015-09-30 LAB — LIPID PANEL
Cholesterol: 175 mg/dL (ref 125–200)
HDL: 40 mg/dL (ref >=40)
LDL Cholesterol: 100 mg/dL (ref <130)
Total CHOL/HDL Ratio: 4.4 Ratio (ref <=5.0)
Triglycerides: 176 mg/dL — ABNORMAL HIGH (ref <150)
VLDL: 35 mg/dL — ABNORMAL HIGH (ref <30)

## 2015-09-30 LAB — TSH: TSH: 0.72 mIU/L (ref 0.40–4.50)

## 2015-09-30 LAB — MAGNESIUM: Magnesium: 2.2 mg/dL (ref 1.5–2.5)

## 2015-10-01 LAB — INSULIN, RANDOM: Insulin: 18.6 u[IU]/mL (ref 2.0–19.6)

## 2015-10-01 LAB — MICROALBUMIN / CREATININE URINE RATIO

## 2015-10-01 LAB — VITAMIN D 25 HYDROXY (VIT D DEFICIENCY, FRACTURES): Vit D, 25-Hydroxy: 81 ng/mL (ref 30–100)

## 2015-10-01 LAB — PSA: PSA: 7.84 ng/mL — ABNORMAL HIGH (ref <=4.00)

## 2015-10-09 ENCOUNTER — Other Ambulatory Visit: Payer: Self-pay | Admitting: *Deleted

## 2015-10-09 MED ORDER — BISOPROLOL-HYDROCHLOROTHIAZIDE 10-6.25 MG PO TABS: 1.0000 | ORAL_TABLET | Freq: Every day | ORAL | 1 refills | Status: DC

## 2015-10-30 ENCOUNTER — Other Ambulatory Visit: Payer: Self-pay | Admitting: *Deleted

## 2015-10-30 DIAGNOSIS — Z1212 Encounter for screening for malignant neoplasm of rectum: Secondary | ICD-10-CM

## 2015-10-30 LAB — POC HEMOCCULT BLD/STL (HOME/3-CARD/SCREEN)
Card #2 Fecal Occult Blod, POC: NEGATIVE
Card #3 Fecal Occult Blood, POC: NEGATIVE
Fecal Occult Blood, POC: NEGATIVE

## 2015-11-27 ENCOUNTER — Ambulatory Visit (INDEPENDENT_AMBULATORY_CARE_PROVIDER_SITE_OTHER): Payer: Medicare Other | Admitting: Internal Medicine

## 2015-11-27 VITALS — BP 138/66 | HR 80 | Temp 98.2°F | Resp 16 | Ht 66.5 in | Wt 130.0 lb

## 2015-11-27 DIAGNOSIS — R300 Dysuria: Secondary | ICD-10-CM | POA: Diagnosis not present

## 2015-11-27 DIAGNOSIS — Z23 Encounter for immunization: Secondary | ICD-10-CM

## 2015-11-27 NOTE — Progress Notes (Signed)
   Subjective:    Patient ID: Matthew Aguirre, male    DOB: 1934-01-06, 82 y.o.   MRN: [id number]  HPI  Patient reports that he is having some burning with nighttime urination and also some suprapubic pressure.  He reports that he is having normal daytime urination habits but has been drinking more than usual.  He does take the bisoprolol hctz which is a newer medication and he is taking this in the evenings.  He reports that he does feel a little bit more achey.  He does have some more left sided flank pain.  Mild right sided flank pain.  It does improve through the day.  He  Reports that he has a history of prostatitis but hasn't had that in years.  This feels much different.     Review of Systems  Constitutional: Negative for chills, fatigue and fever.  Gastrointestinal: Negative for abdominal pain, constipation, diarrhea, nausea and vomiting.  Genitourinary: Positive for dysuria, frequency and urgency. Negative for hematuria.       Objective:   Physical Exam  Constitutional: He is oriented to person, place, and time. He appears well-developed and well-nourished. No distress.  HENT:  Head: Normocephalic.  Mouth/Throat: Oropharynx is clear and moist. No oropharyngeal exudate.  Eyes: Conjunctivae are normal. No scleral icterus.  Neck: Normal range of motion. Neck supple. No JVD present. No thyromegaly present.  Cardiovascular: Normal rate, regular rhythm, normal heart sounds and intact distal pulses.  Exam reveals no gallop and no friction rub.   No murmur heard. Pulmonary/Chest: Effort normal and breath sounds normal. No respiratory distress. He has no wheezes. He has no rales. He exhibits no tenderness.  Abdominal: Soft. Bowel sounds are normal. He exhibits no distension and no mass. There is tenderness in the suprapubic area. There is no rebound, no guarding and no CVA tenderness.  Musculoskeletal: Normal range of motion.  Lymphadenopathy:    He has no cervical adenopathy.   Neurological: He is alert and oriented to person, place, and time. No cranial nerve deficit. Coordination normal.  Skin: Skin is warm and dry. No rash noted. He is not diaphoretic.  Psychiatric: He has a normal mood and affect. His behavior is normal. Judgment and thought content normal.  Nursing note and vitals reviewed.   Vitals:   11/27/15 1356  BP: 138/66  Pulse: 80  Resp: 16  Temp: 98.2 F (36.8 C)          Assessment & Plan:    1. Dysuria -BPH vs. Cystitis  -if no evidence of infection consider stopping ziac and starting doxazosin instead -for now move ziac to the mornings.   - Urinalysis, Reflex Microscopic - Culture, Urine  2. Need for prophylactic vaccination and inoculation against influenza  - Flu vaccine HIGH DOSE PF (Fluzone High dose)

## 2015-11-27 NOTE — Patient Instructions (Signed)
Please start taking the bisoprolol HCTZ in the mornings.  Please drink either lemonade or cranberry juice to help acidify your urine.  This will protect you from urinary tract infections.    We will call in an antibiotic tomorrow morning if there is any evidence of infection.

## 2015-11-28 LAB — URINALYSIS, ROUTINE W REFLEX MICROSCOPIC
Bilirubin Urine: NEGATIVE
Glucose, UA: NEGATIVE
Hgb urine dipstick: NEGATIVE
Ketones, ur: NEGATIVE
Leukocytes, UA: NEGATIVE
Nitrite: NEGATIVE
Protein, ur: NEGATIVE
Specific Gravity, Urine: 1.015 (ref 1.001–1.035)
pH: 7 (ref 5.0–8.0)

## 2015-11-28 LAB — URINE CULTURE: Organism ID, Bacteria: NO GROWTH

## 2016-01-09 ENCOUNTER — Ambulatory Visit (INDEPENDENT_AMBULATORY_CARE_PROVIDER_SITE_OTHER): Payer: Medicare Other | Admitting: Internal Medicine

## 2016-01-09 ENCOUNTER — Encounter: Payer: Self-pay | Admitting: Internal Medicine

## 2016-01-09 VITALS — BP 146/78 | HR 74 | Temp 98.2°F | Resp 14 | Ht 66.5 in | Wt 134.0 lb

## 2016-01-09 DIAGNOSIS — R7309 Other abnormal glucose: Secondary | ICD-10-CM | POA: Diagnosis not present

## 2016-01-09 DIAGNOSIS — E559 Vitamin D deficiency, unspecified: Secondary | ICD-10-CM | POA: Diagnosis not present

## 2016-01-09 DIAGNOSIS — N401 Enlarged prostate with lower urinary tract symptoms: Secondary | ICD-10-CM

## 2016-01-09 DIAGNOSIS — E782 Mixed hyperlipidemia: Secondary | ICD-10-CM

## 2016-01-09 DIAGNOSIS — R7303 Prediabetes: Secondary | ICD-10-CM

## 2016-01-09 DIAGNOSIS — Z79899 Other long term (current) drug therapy: Secondary | ICD-10-CM | POA: Diagnosis not present

## 2016-01-09 DIAGNOSIS — I10 Essential (primary) hypertension: Secondary | ICD-10-CM

## 2016-01-09 NOTE — Progress Notes (Signed)
Assessment and Plan:  Hypertension:  -Continue medication,  -monitor blood pressure at home.  -Continue DASH diet.   -Reminder to go to the ER if any CP, SOB, nausea, dizziness, severe HA, changes vision/speech, left arm numbness and tingling, and jaw pain.  Cholesterol: -Continue diet and exercise.  -Check cholesterol.   Pre-diabetes: -Continue diet and exercise.  -Check A1C  Vitamin D Def: -check level -continue medications.   Situational Anxiety -cont to monitor -patient does not want any medication at this time -judgement is appropriate.   Continue diet and meds as discussed. Further disposition pending results of labs.  HPI 82 y.o. male  presents for 3 month follow up with hypertension, hyperlipidemia, prediabetes and vitamin D.   His blood pressure has been controlled at home, today their BP is BP: (!) 146/78.   He does workout. He denies chest pain, shortness of breath, dizziness.  He reports that he is taking care of the house and he is also getting some walking in as well.     He is on cholesterol medication and denies myalgias. His cholesterol is at goal. The cholesterol last visit was:   Lab Results  Component Value Date   CHOL 175 09/30/2015   HDL 40 09/30/2015   LDLCALC 100 09/30/2015   TRIG 176 (H) 09/30/2015   CHOLHDL 4.4 09/30/2015     He has been working on diet and exercise for prediabetes, and denies foot ulcerations, hyperglycemia, hypoglycemia , increased appetite, nausea, paresthesia of the feet, polydipsia, polyuria, visual disturbances, vomiting and weight loss. Last A1C in the office was:  Lab Results  Component Value Date   HGBA1C 5.6 09/30/2015    Patient is on Vitamin D supplement.  Lab Results  Component Value Date   VD25OH 18 09/30/2015     He is having a hard time with his wife being under hospice care.   He reports that she is still angry and combative when he goes to visit her.  He will have moments when he looks at stuff in his  house and it reminds him of her and he will start crying.  He does have good support from his church members.     Current Medications:  Current Outpatient Prescriptions on File Prior to Visit  Medication Sig Dispense Refill  . BABY ASPIRIN PO Take 81 mg by mouth daily.    . bisoprolol-hydrochlorothiazide (ZIAC) 10-6.25 MG tablet Take 1 tablet by mouth daily. 90 tablet 1  . Cholecalciferol (VITAMIN D PO) Take 2,000 Int'l Units by mouth 2 (two) times daily.     . finasteride (PROSCAR) 5 MG tablet TAKE 1 TABLET EVERY DAY FOR BLOOD PRESSURE AND PROSTATE 90 tablet 3  . Flaxseed, Linseed, (FLAX SEED OIL PO) Take 1,200 mg by mouth 3 (three) times daily.     . Magnesium 250 MG TABS Take 250 mg by mouth daily.    . Omega-3 Fatty Acids (FISH OIL PO) Take 1,000 mg by mouth 2 (two) times daily.      No current facility-administered medications on file prior to visit.     Medical History:  Past Medical History:  Diagnosis Date  . Essential hypertension 02/15/2007  . Hyperlipidemia 07/13/2013  . Prediabetes 07/13/2013  . Vitamin D deficiency 07/13/2013    Allergies:  Allergies  Allergen Reactions  . Pravastatin   . Red Yeast Rice [Cholestin]   . Sulfa Antibiotics   . Vibramycin [Doxycycline Calcium]   . Zetia [Ezetimibe]      Review  of Systems:  Review of Systems  Constitutional: Negative for chills, fever and malaise/fatigue.  HENT: Negative for congestion, ear pain and sore throat.   Eyes: Negative.   Respiratory: Negative for cough, shortness of breath and wheezing.   Cardiovascular: Negative for chest pain, palpitations and leg swelling.  Gastrointestinal: Negative for abdominal pain, blood in stool, constipation, diarrhea, heartburn and melena.  Genitourinary: Negative.   Skin: Negative.   Neurological: Negative for dizziness, sensory change, loss of consciousness and headaches.  Psychiatric/Behavioral: Negative for depression. The patient is not nervous/anxious and does not have  insomnia.     Family history- Review and unchanged  Social history- Review and unchanged  Physical Exam: BP (!) 146/78   Pulse 74   Temp 98.2 F (36.8 C) (Temporal)   Resp 14   Ht 5' 6.5" (1.689 m)   Wt 134 lb (60.8 kg)   BMI 21.30 kg/m  Wt Readings from Last 3 Encounters:  01/09/16 134 lb (60.8 kg)  11/27/15 130 lb (59 kg)  09/30/15 130 lb 12.8 oz (59.3 kg)    General Appearance: Well nourished well developed, in no apparent distress. Eyes: PERRLA, EOMs, conjunctiva no swelling or erythema ENT/Mouth: Ear canals normal without obstruction, swelling, erythma, discharge.  TMs normal bilaterally.  Oropharynx moist, clear, without exudate, or postoropharyngeal swelling. Neck: Supple, thyroid normal,no cervical adenopathy  Respiratory: Respiratory effort normal, Breath sounds clear A&P without rhonchi, wheeze, or rale.  No retractions, no accessory usage. Cardio: RRR with no MRGs. Brisk peripheral pulses without edema.  Abdomen: Soft, + BS,  Non tender, no guarding, rebound, hernias, masses. Musculoskeletal: Full ROM, 5/5 strength, Normal gait Skin: Warm, dry without rashes, lesions, ecchymosis.  Neuro: Awake and oriented X 3, Cranial nerves intact. Normal muscle tone, no cerebellar symptoms. Psych: Normal affect, Insight and Judgment appropriate.    Starlyn Skeans, PA-C 10:55 AM [facility]oles[facility]

## 2016-02-27 ENCOUNTER — Ambulatory Visit (INDEPENDENT_AMBULATORY_CARE_PROVIDER_SITE_OTHER): Payer: Medicare Other | Admitting: Internal Medicine

## 2016-02-27 ENCOUNTER — Encounter: Payer: Self-pay | Admitting: Internal Medicine

## 2016-02-27 VITALS — BP 126/78 | HR 60 | Temp 98.0°F | Resp 14 | Ht 66.5 in | Wt 138.0 lb

## 2016-02-27 DIAGNOSIS — R21 Rash and other nonspecific skin eruption: Secondary | ICD-10-CM

## 2016-02-27 MED ORDER — TRIAMCINOLONE ACETONIDE 0.1 % EX CREA: 1.0000 "application " | TOPICAL_CREAM | Freq: Three times a day (TID) | CUTANEOUS | 0 refills | Status: DC

## 2016-02-27 MED ORDER — PREDNISONE 20 MG PO TABS: ORAL_TABLET | ORAL | 0 refills | Status: DC

## 2016-02-27 NOTE — Progress Notes (Signed)
   Subjective:    Patient ID: Matthew Aguirre, male    DOB: 1933/11/05, 81 y.o.   MRN: [id number]  HPI  Patient presents to the office for evaluation of rash on his right wrist.  It has been there for several days.  He reports that he has had no changes in his personal products.  He notes that he has had chicken pox as a child.  He reports that he has not had the shingles vaccine.  He has not put any kind of creams or lotions on there.  It does not itch and it is not painful.  He did pull a vine out of some of the bushes in the yard.   Review of Systems  Constitutional: Negative for chills, fatigue and fever.  Gastrointestinal: Negative for nausea and vomiting.  Skin: Positive for rash.  Neurological: Negative for numbness.       Objective:   Physical Exam  Constitutional: He is oriented to person, place, and time. He appears well-developed and well-nourished. No distress.  HENT:  Head: Normocephalic.  Mouth/Throat: Oropharynx is clear and moist. No oropharyngeal exudate.  Eyes: Conjunctivae are normal. No scleral icterus.  Neck: Normal range of motion. Neck supple. No JVD present. No thyromegaly present.  Cardiovascular: Normal rate, regular rhythm, normal heart sounds and intact distal pulses.  Exam reveals no gallop and no friction rub.   No murmur heard. Pulmonary/Chest: Effort normal and breath sounds normal. No respiratory distress. He has no wheezes. He has no rales. He exhibits no tenderness.  Abdominal: Soft. Bowel sounds are normal. He exhibits no distension and no mass. There is no tenderness. There is no rebound and no guarding.  Musculoskeletal: Normal range of motion.  Lymphadenopathy:    He has no cervical adenopathy.  Neurological: He is alert and oriented to person, place, and time. No cranial nerve deficit. Coordination normal.  Skin: Skin is warm and dry. Rash noted. He is not diaphoretic.     Psychiatric: He has a normal mood and affect. His behavior is  normal. Judgment and thought content normal.  Nursing note and vitals reviewed.   Vitals:   02/27/16 1442  BP: 126/78  Pulse: 60  Resp: 14  Temp: 98 F (36.7 C)        Assessment & Plan:    1. Rash and nonspecific skin eruption -likely contact dermatitis secondary to the vine he pulled out of his bushes -prednisone -kenalog -call if no improvement. -doubt shingles and outside of period where valtrex recommended.

## 2016-02-27 NOTE — Patient Instructions (Signed)
Please use a thin layer of kenalog three times daily on the rash.  Please use saran wrap on top of it to help push the medication into the skin.  Please take the prednisone until it is gone.

## 2016-03-19 DIAGNOSIS — H524 Presbyopia: Secondary | ICD-10-CM | POA: Diagnosis not present

## 2016-03-24 ENCOUNTER — Other Ambulatory Visit: Payer: Self-pay | Admitting: Internal Medicine

## 2016-04-13 ENCOUNTER — Other Ambulatory Visit: Payer: Self-pay | Admitting: *Deleted

## 2016-04-13 ENCOUNTER — Ambulatory Visit (INDEPENDENT_AMBULATORY_CARE_PROVIDER_SITE_OTHER): Payer: Medicare HMO | Admitting: Internal Medicine

## 2016-04-13 VITALS — BP 116/76 | HR 72 | Temp 97.5°F | Resp 16 | Ht 66.5 in | Wt 141.2 lb

## 2016-04-13 DIAGNOSIS — E559 Vitamin D deficiency, unspecified: Secondary | ICD-10-CM | POA: Diagnosis not present

## 2016-04-13 DIAGNOSIS — R7309 Other abnormal glucose: Secondary | ICD-10-CM

## 2016-04-13 DIAGNOSIS — I10 Essential (primary) hypertension: Secondary | ICD-10-CM

## 2016-04-13 DIAGNOSIS — Z79899 Other long term (current) drug therapy: Secondary | ICD-10-CM

## 2016-04-13 DIAGNOSIS — R7303 Prediabetes: Secondary | ICD-10-CM

## 2016-04-13 DIAGNOSIS — E782 Mixed hyperlipidemia: Secondary | ICD-10-CM

## 2016-04-13 LAB — BASIC METABOLIC PANEL WITH GFR
BUN: 19 mg/dL (ref 7–25)
CO2: 27 mmol/L (ref 20–31)
Calcium: 9.5 mg/dL (ref 8.6–10.3)
Chloride: 104 mmol/L (ref 98–110)
Creat: 1.17 mg/dL — ABNORMAL HIGH (ref 0.70–1.11)
GFR, Est African American: 67 mL/min (ref >=60)
GFR, Est Non African American: 58 mL/min — ABNORMAL LOW (ref >=60)
Glucose, Bld: 79 mg/dL (ref 65–99)
Potassium: 4.2 mmol/L (ref 3.5–5.3)
Sodium: 142 mmol/L (ref 135–146)

## 2016-04-13 LAB — CBC WITH DIFFERENTIAL/PLATELET
Basophils Absolute: 0 cells/uL (ref 0–200)
Basophils Relative: 0 %
Eosinophils Absolute: 66 cells/uL (ref 15–500)
Eosinophils Relative: 1 %
HCT: 49.3 % (ref 38.5–50.0)
Hemoglobin: 16.6 g/dL (ref 13.2–17.1)
Lymphocytes Relative: 22 %
Lymphs Abs: 1452 cells/uL (ref 850–3900)
MCH: 30.9 pg (ref 27.0–33.0)
MCHC: 33.7 g/dL (ref 32.0–36.0)
MCV: 91.8 fL (ref 80.0–100.0)
MPV: 11.6 fL (ref 7.5–12.5)
Monocytes Absolute: 726 cells/uL (ref 200–950)
Monocytes Relative: 11 %
Neutro Abs: 4356 cells/uL (ref 1500–7800)
Neutrophils Relative %: 66 %
Platelets: 199 10*3/uL (ref 140–400)
RBC: 5.37 MIL/uL (ref 4.20–5.80)
RDW: 13.5 % (ref 11.0–15.0)
WBC: 6.6 10*3/uL (ref 3.8–10.8)

## 2016-04-13 LAB — HEPATIC FUNCTION PANEL
ALT: 20 U/L (ref 9–46)
AST: 19 U/L (ref 10–35)
Albumin: 3.9 g/dL (ref 3.6–5.1)
Alkaline Phosphatase: 55 U/L (ref 40–115)
Bilirubin, Direct: 0.1 mg/dL (ref <=0.2)
Indirect Bilirubin: 0.6 mg/dL (ref 0.2–1.2)
Total Bilirubin: 0.7 mg/dL (ref 0.2–1.2)
Total Protein: 6.5 g/dL (ref 6.1–8.1)

## 2016-04-13 LAB — HEMOGLOBIN A1C
Hgb A1c MFr Bld: 5.4 % (ref <5.7)
Mean Plasma Glucose: 108 mg/dL

## 2016-04-13 LAB — LIPID PANEL
Cholesterol: 185 mg/dL (ref <200)
HDL: 31 mg/dL — ABNORMAL LOW (ref >40)
LDL Cholesterol: 105 mg/dL — ABNORMAL HIGH (ref <100)
Total CHOL/HDL Ratio: 6 Ratio — ABNORMAL HIGH (ref <5.0)
Triglycerides: 244 mg/dL — ABNORMAL HIGH (ref <150)
VLDL: 49 mg/dL — ABNORMAL HIGH (ref <30)

## 2016-04-13 LAB — TSH: TSH: 0.89 mIU/L (ref 0.40–4.50)

## 2016-04-13 MED ORDER — FINASTERIDE 5 MG PO TABS: ORAL_TABLET | ORAL | 3 refills | Status: DC

## 2016-04-13 MED ORDER — BISOPROLOL-HYDROCHLOROTHIAZIDE 10-6.25 MG PO TABS: 1.0000 | ORAL_TABLET | Freq: Every day | ORAL | 1 refills | Status: DC

## 2016-04-13 NOTE — Patient Instructions (Signed)

## 2016-04-13 NOTE — Progress Notes (Signed)
[facility] Unk Pinto, M.D.        Matthew Aguirre. Silverio Lay, P.A.-C       Matthew Aguirre, P.A.-C  [facility]                [address] [place], [place] [id number] Telephone [phone] Telefax [phone] ______________________________________________________________________     This very nice 83 y.o. recently widowed WM presents for 6 month follow up with Hypertension, Hyperlipidemia, Pre-Diabetes and Vitamin D Deficiency.  Patient also dx'd with Gleason 6 Prostate Cancer in Apr 2014 & is followed on Finasteride by active surveillance per Dr Junious Silk.     Patient is treated for HTN circa 2003 & BP has been controlled at home. Today's BP is at goal - 116/76. Patient has had no complaints of any cardiac type chest pain, palpitations, dyspnea/orthopnea/PND, dizziness, claudication, or dependent edema.     Hyperlipidemia is controlled with diet. Patient denies myalgias or other med SE's. Current  Lipids are at goal albeit elevated Trig's: Lab Results  Component Value Date   CHOL 185 04/13/2016   HDL 31 (L) 04/13/2016   LDLCALC 105 (H) 04/13/2016   TRIG 244 (H) 04/13/2016   CHOLHDL 6.0 (H) 04/13/2016      Also, the patient has history of PreDiabetes circa 2008 and has had no symptoms of reactive hypoglycemia, diabetic polys, paresthesias or visual blurring.  Current A1c is at goal:  Lab Results  Component Value Date   HGBA1C 5.4 04/13/2016      Further, the patient also has history of Vitamin D Deficiency and supplements vitamin D without any suspected side-effects. Current vitamin D is at goal: Lab Results  Component Value Date   VD25OH 69 04/13/2016   Current Outpatient Prescriptions on File Prior to Visit  Medication Sig  . BABY ASPIRIN  Take daily.  Marland Kitchen VITAMIN D  2,000  Units Take 2  times daily.   Marland Kitchen FLAX SEED OIL  Take 1,200 mg   3  times daily.   . Magnesium 250 MG  Take  daily.  .  Omega-3 FISH OIL  Take 1,000 mg   2 times daily.   Marland Kitchen triamcinolone crm  0.1 % Apply  topically 3times daily.    Allergies  Allergen Reactions  . Pravastatin   . Red Yeast Rice [Cholestin]   . Sulfa Antibiotics   . Vibramycin [Doxycycline Calcium]   . Zetia [Ezetimibe]    PMHx:   Past Medical History:  Diagnosis Date  . Essential hypertension 02/15/2007  . Hyperlipidemia 07/13/2013  . Prediabetes 07/13/2013  . Vitamin D deficiency 07/13/2013   Immunization History  Administered Date(s) Administered  . DT 05/17/2015  . Influenza, High Dose Seasonal PF 01/23/2014, 02/13/2015, 11/27/2015  . Pneumococcal Conjugate-13 10/12/2013  . Pneumococcal Polysaccharide-23 05/17/2015   No past surgical history on file.   FHx:    Reviewed / unchanged  SHx:    Reviewed / unchanged  Systems Review:  Constitutional: Denies fever, chills, wt changes, headaches, insomnia, fatigue, night sweats, change in appetite. Eyes: Denies redness, blurred vision, diplopia, discharge, itchy, watery eyes.  ENT: Denies discharge, congestion, post nasal drip, epistaxis, sore throat, earache, hearing loss, dental pain, tinnitus, vertigo, sinus pain, snoring.  CV: Denies chest pain, palpitations, irregular heartbeat, syncope, dyspnea, diaphoresis, orthopnea, PND, claudication or edema. Respiratory: denies cough, dyspnea, DOE, pleurisy, hoarseness, laryngitis, wheezing.  Gastrointestinal: Denies  dysphagia, odynophagia, heartburn, reflux, water brash, abdominal pain or cramps, nausea, vomiting, bloating, diarrhea, constipation, hematemesis, melena, hematochezia  or hemorrhoids. Genitourinary: Denies dysuria, frequency, urgency, nocturia, hesitancy, discharge, hematuria or flank pain. Musculoskeletal: Denies arthralgias, myalgias, stiffness, jt. swelling, pain, limping or strain/sprain.  Skin: Denies pruritus, rash, hives, warts, acne, eczema or change in skin lesion(s). Neuro: No weakness, tremor, incoordination, spasms,  paresthesia or pain. Psychiatric: Denies confusion, memory loss or sensory loss. Endo: Denies change in weight, skin or hair change.  Heme/Lymph: No excessive bleeding, bruising or enlarged lymph nodes.  Physical Exam  BP 116/76   Pulse 72   Temp 97.5 F (36.4 C)   Resp 16   Ht 5' 6.5" (1.689 m)   Wt 141 lb 3.2 oz (64 kg)   BMI 22.45 kg/m   Appears well nourished and in no distress.  Eyes: PERRLA, EOMs, conjunctiva no swelling or erythema. Sinuses: No frontal/maxillary tenderness ENT/Mouth: EAC's clear, TM's nl w/o erythema, bulging. Nares clear w/o erythema, swelling, exudates. Oropharynx clear without erythema or exudates. Oral hygiene is good. Tongue normal, non obstructing. Hearing intact.  Neck: Supple. Thyroid nl. Car 2+/2+ without bruits, nodes or JVD. Chest: Respirations nl with BS clear & equal w/o rales, rhonchi, wheezing or stridor.  Cor: Heart sounds normal w/ regular rate and rhythm without sig. murmurs, gallops, clicks, or rubs. Peripheral pulses normal and equal  without edema.  Abdomen: Soft & bowel sounds normal. Non-tender w/o guarding, rebound, hernias, masses, or organomegaly.  Lymphatics: Unremarkable.  Musculoskeletal: Full ROM all peripheral extremities, joint stability, 5/5 strength, and normal gait.  Skin: Warm, dry without exposed rashes, lesions or ecchymosis apparent.  Neuro: Cranial nerves intact, reflexes equal bilaterally. Sensory-motor testing grossly intact. Tendon reflexes grossly intact.  Pysch: Alert & oriented x 3.  Insight and judgement nl & appropriate. No ideations.  Assessment and Plan:    1. Essential hypertension  - Continue medication, monitor blood pressure at home.  - Continue DASH diet. Reminder to go to the ER if any CP,  SOB, nausea, dizziness, severe HA, changes vision/speech,  left arm numbness and tingling and jaw pain.  - CBC with Differential/Platelet - BASIC METABOLIC PANEL WITH GFR - TSH  2. Hyperlipidemia  -  Continue diet/meds, exercise,& lifestyle modifications.  - Continue monitor periodic cholesterol/liver & renal functions   - Hepatic function panel - Lipid panel - TSH  3. Prediabetes  - Continue diet, exercise, lifestyle modifications.  - Monitor appropriate labs.  - Hemoglobin A1c - Insulin, random  4. Vitamin D deficiency  - Continue supplementation.  - VITAMIN D 25 Hydroxy   5. Other abnormal glucose   6. Medication management  - CBC with Differential/Platelet - BASIC METABOLIC PANEL WITH GFR - Hepatic function panel - Magnesium - Lipid panel - VITAMIN D 25 Hydroxy        Recommended regular exercise, BP monitoring, weight control, and discussed med and SE's. Recommended labs to assess and monitor clinical status. Further disposition pending results of labs. Over 30 minutes of exam, counseling, chart review was performed

## 2016-04-14 LAB — VITAMIN D 25 HYDROXY (VIT D DEFICIENCY, FRACTURES): Vit D, 25-Hydroxy: 69 ng/mL (ref 30–100)

## 2016-04-14 LAB — INSULIN, RANDOM: Insulin: 36.6 u[IU]/mL — ABNORMAL HIGH (ref 2.0–19.6)

## 2016-04-14 LAB — MAGNESIUM: Magnesium: 2.1 mg/dL (ref 1.5–2.5)

## 2016-04-18 ENCOUNTER — Encounter: Payer: Self-pay | Admitting: Internal Medicine

## 2016-07-10 NOTE — Progress Notes (Signed)
MEDICARE ANNUAL WELLNESS VISIT AND FOLLOW UP Assessment:     Essential hypertension -cont meds -dash diet -monitor at home -cont exercise   Hyperlipidemia -monitor with labs -currently controlled by diet  Prediabetes -cont diet and exercise  Vitamin D deficiency -cont supplement   Medication management -labs at next visit  Pulmonary sarcoidosis (1988)  -followed by pulmonary   BPH (benign prostatic hypertrophy) -followed by urology  Pulmonary nodule (2008 resolved on f/u)  -resolved   Elevated PSA -BPH -followed by urology   Other abnormal glucose -diet and exercise   Body mass index (BMI) of 20.0-20.9 in adult -well controlled in normal range   Over 30 minutes of exam, counseling, chart review, and critical decision making was performed  Future Appointments Date Time Provider [place]  10/28/2016 10:00 AM Unk Pinto, MD [facility] None     Plan:   During the course of the visit the patient was educated and counseled about appropriate screening and preventive services including:    Pneumococcal vaccine   Influenza vaccine  Prevnar 13  Td vaccine  Screening electrocardiogram  Colorectal cancer screening  Diabetes screening  Glaucoma screening  Nutrition counseling     Subjective:  Matthew Aguirre is a 83 y.o. male who presents for Medicare Annual Wellness Visit and 3 month follow up for HTN, hyperlipidemia, prediabetes, and vitamin D Def.   His blood pressure has been controlled at home, brought in record and has been 120's, today their BP is BP: (!) 142/80 He does workout. He denies chest pain, shortness of breath, dizziness.  He is not on cholesterol medication, on FO/Flax and denies myalgias. His cholesterol is at goal. The cholesterol last visit was:   Lab Results  Component Value Date   CHOL 185 04/13/2016   HDL 31 (L) 04/13/2016   LDLCALC 105 (H) 04/13/2016   TRIG 244 (H) 04/13/2016   CHOLHDL 6.0 (H)  04/13/2016   He has been working on diet and exercise for prediabetes, and denies foot ulcerations, hyperglycemia, hypoglycemia , increased appetite, nausea, paresthesia of the feet, polydipsia, polyuria, visual disturbances, vomiting and weight loss. Last A1C in the office was:  Lab Results  Component Value Date   HGBA1C 5.4 04/13/2016   Last GFR   Lab Results  Component Value Date   GFRNONAA 58 (L) 04/13/2016   Patient is on Vitamin D supplement.   Lab Results  Component Value Date   VD25OH 69 04/13/2016     He did see Dr. Junious Silk and follows once a year now.   BMI is Body mass index is 22.89 kg/m., he is working on diet and exercise. Wt Readings from Last 3 Encounters:  07/13/16 144 lb (65.3 kg)  04/13/16 141 lb 3.2 oz (64 kg)  02/27/16 138 lb (62.6 kg)    Medication Review: Current Outpatient Prescriptions on File Prior to Visit  Medication Sig Dispense Refill  . BABY ASPIRIN PO Take 81 mg by mouth daily.    . bisoprolol-hydrochlorothiazide (ZIAC) 10-6.25 MG tablet Take 1 tablet by mouth daily. 90 tablet 1  . Cholecalciferol (VITAMIN D PO) Take 2,000 Int'l Units by mouth 2 (two) times daily.     . finasteride (PROSCAR) 5 MG tablet TAKE 1 TABLET EVERY DAY FOR BLOOD PRESSURE AND PROSTATE 90 tablet 3  . Flaxseed, Linseed, (FLAX SEED OIL PO) Take 1,200 mg by mouth 3 (three) times daily.     . Magnesium 250 MG TABS Take 250 mg by mouth daily.    Marland Kitchen  Omega-3 Fatty Acids (FISH OIL PO) Take 1,000 mg by mouth 2 (two) times daily.      No current facility-administered medications on file prior to visit.     Current Problems (verified) Patient Active Problem List   Diagnosis Date Noted  . Other abnormal glucose 02/13/2015  . Body mass index (BMI) of 20.0-20.9 in adult 02/13/2015  . Elevated PSA 07/30/2014  . 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 Benign prostatic hyperplasia 04/19/2014  . Hyperlipidemia 07/13/2013  . Vitamin D deficiency 07/13/2013  . Medication  management 07/13/2013  . Essential hypertension 02/15/2007  . Pulmonary nodule (2008 resolved on f/u)  02/15/2007    Screening Tests Immunization History  Administered Date(s) Administered  . DT 05/17/2015  . Influenza, High Dose Seasonal PF 01/23/2014, 02/13/2015, 11/27/2015  . Pneumococcal Conjugate-13 10/12/2013  . Pneumococcal Polysaccharide-23 05/17/2015    Preventative care: Last colonoscopy: 2014, declines further screenings  Prior vaccinations: TD or Tdap: 2017  Influenza: 2017  Pneumococcal:2017 Prevnar13: 2015 Shingles/Zostavax: Will call insurance company about it  Names of Other Physician/Practitioners you currently use: 1. [facility] here for primary care 2. Dr. Gershon Crane, eye doctor, last visit 03/2016 3. Dr. Orvil Feil, dentist, last visit 2018 Patient Care Team: Unk Pinto, MD as PCP - General (Internal Medicine) Festus Aloe, MD as Consulting Physician (Urology) Inda Castle, MD as Consulting Physician (Gastroenterology) Rutherford Guys, MD as Consulting Physician (Ophthalmology)  Allergies Allergies  Allergen Reactions  . Pravastatin   . Red Yeast Rice [Cholestin]   . Sulfa Antibiotics   . Vibramycin [Doxycycline Calcium]   . Zetia [Ezetimibe]     SURGICAL HISTORY He  has a past surgical history that includes Treatment fistula anal and Tonsilectomy/adenoidectomy with myringotomy. FAMILY HISTORY His family history includes Hyperlipidemia in his brother; Hypertension in his brother. SOCIAL HISTORY He  reports that he has never smoked. He has never used smokeless tobacco. He reports that he does not drink alcohol.  MEDICARE WELLNESS OBJECTIVES: Physical activity: Current Exercise Habits: Home exercise routine, Type of exercise: walking (yardwork/house work), Intensity: Mild Cardiac risk factors: Cardiac Risk Factors include: advanced age (>73men, >40 women);dyslipidemia;male gender;hypertension;sedentary  lifestyle Depression/mood screen:   Depression screen [facility] 2/9 07/13/2016  Decreased Interest 0  Down, Depressed, Hopeless 0  PHQ - 2 Score 0    ADLs:  In your present state of health, do you have any difficulty performing the following activities: 04/18/2016 09/30/2015  Hearing? N N  Vision? N N  Difficulty concentrating or making decisions? N N  Walking or climbing stairs? N N  Dressing or bathing? N N  Doing errands, shopping? N N  Some recent data might be hidden     Cognitive Testing  Alert? Yes  Normal Appearance?Yes  Oriented to person? Yes  Place? Yes   Time? Yes  Recall of three objects?  Yes  Can perform simple calculations? Yes  Displays appropriate judgment?Yes  Can read the correct time from a watch face?Yes  EOL planning: Does Patient Have a Medical Advance Directive?: Yes Type of Advance Directive: Healthcare Power of Attorney, Living will Copy of [place] in Chart?: No - copy requested   Objective:   Today's Vitals   07/13/16 1125  BP: (!) 142/80  Pulse: 60  Resp: 14  Temp: 97.7 F (36.5 C)  SpO2: 97%  Weight: 144 lb (65.3 kg)  Height: 5' 6.5" (1.689 m)  PainSc: 0-No pain   Body mass index is 22.89 kg/m.  General appearance: alert, no distress, WD/WN, male HEENT: normocephalic, sclerae anicteric, TMs pearly, nares patent, no discharge or erythema, pharynx normal Oral cavity: MMM, no lesions Neck: supple, no lymphadenopathy, no thyromegaly, no masses Heart: RRR, normal S1, S2, no murmurs Lungs: CTA bilaterally, no wheezes, rhonchi, or rales Abdomen: +bs, soft, non tender, non distended, no masses, no hepatomegaly, no splenomegaly Musculoskeletal: nontender, no swelling, no obvious deformity Extremities: no edema, no cyanosis, no clubbing Pulses: 2+ symmetric, upper and lower extremities, normal cap refill Neurological: alert, oriented x 3, CN2-12 intact, strength normal upper extremities and lower extremities, sensation normal  throughout, DTRs 2+ throughout, no cerebellar signs, gait normal Psychiatric: normal affect, behavior normal, pleasant   Medicare Attestation I have personally reviewed: The patient's medical and social history Their use of alcohol, tobacco or illicit drugs Their current medications and supplements The patient's functional ability including ADLs,fall risks, home safety risks, cognitive, and hearing and visual impairment Diet and physical activities Evidence for depression or mood disorders  The patient's weight, height, BMI, and visual acuity have been recorded in the chart.  I have made referrals, counseling, and provided education to the patient based on review of the above and I have provided the patient with a written personalized care plan for preventive services.     Vicie Mutters, PA-C   07/13/2016

## 2016-07-13 ENCOUNTER — Encounter: Payer: Self-pay | Admitting: Physician Assistant

## 2016-07-13 ENCOUNTER — Ambulatory Visit: Payer: Self-pay | Admitting: Internal Medicine

## 2016-07-13 ENCOUNTER — Ambulatory Visit (INDEPENDENT_AMBULATORY_CARE_PROVIDER_SITE_OTHER): Payer: Medicare HMO | Admitting: Physician Assistant

## 2016-07-13 VITALS — BP 142/80 | HR 60 | Temp 97.7°F | Resp 14 | Ht 66.5 in | Wt 144.0 lb

## 2016-07-13 DIAGNOSIS — Z0000 Encounter for general adult medical examination without abnormal findings: Secondary | ICD-10-CM

## 2016-07-13 DIAGNOSIS — Z0001 Encounter for general adult medical examination with abnormal findings: Secondary | ICD-10-CM

## 2016-07-13 DIAGNOSIS — R6889 Other general symptoms and signs: Secondary | ICD-10-CM | POA: Diagnosis not present

## 2016-07-13 DIAGNOSIS — R911 Solitary pulmonary nodule: Secondary | ICD-10-CM

## 2016-07-13 DIAGNOSIS — N401 Enlarged prostate with lower urinary tract symptoms: Secondary | ICD-10-CM

## 2016-07-13 DIAGNOSIS — Z6820 Body mass index (BMI) 20.0-20.9, adult: Secondary | ICD-10-CM

## 2016-07-13 DIAGNOSIS — I10 Essential (primary) hypertension: Secondary | ICD-10-CM | POA: Diagnosis not present

## 2016-07-13 DIAGNOSIS — R972 Elevated prostate specific antigen [PSA]: Secondary | ICD-10-CM

## 2016-07-13 DIAGNOSIS — R7309 Other abnormal glucose: Secondary | ICD-10-CM

## 2016-07-13 DIAGNOSIS — D860 Sarcoidosis of lung: Secondary | ICD-10-CM

## 2016-07-13 DIAGNOSIS — E559 Vitamin D deficiency, unspecified: Secondary | ICD-10-CM | POA: Diagnosis not present

## 2016-07-13 DIAGNOSIS — R69 Illness, unspecified: Secondary | ICD-10-CM | POA: Diagnosis not present

## 2016-07-13 DIAGNOSIS — Z79899 Other long term (current) drug therapy: Secondary | ICD-10-CM

## 2016-07-13 DIAGNOSIS — E782 Mixed hyperlipidemia: Secondary | ICD-10-CM

## 2016-07-13 NOTE — Patient Instructions (Signed)
10 Tips on Belching, Bloating, and Flatulence 1. Belching is caused by swallowed air from:  Eating or drinking too fast  Poorly fitting dentures; not chewing food completely  Carbonated beverages  Chewing gum or sucking on hard candies  Excessive swallowing due to nervous tension or postnasal drip  Forced belching to relieve abdominal discomfort 2. To prevent excessive belching, avoid:  Carbonated beverages  Chewing gum  Hard candies  Simethicone/GasX may be helpful  3. Abdominal bloating and discomfort may be due to intestinal sensitivity or symptoms of irritable bowel syndrome. To relieve symptoms, avoid:  Broccoli  Baked beans  Cabbage  Carbonated drinks  Cauliflower  Chewing gum  Hard candy 4. Abdominal distention resulting from weak abdominal muscles:  Is better in the morning  Gets worse as the day progresses  Is relieved by lying down 5. To prevent Abdominal distention:  Tighten abdominal muscles by pulling in your stomach several times during the day  Do sit-up exercises if possible  Wear an abdominal support garment if exercise is too difficult 6. Flatulence is gas created through bacterial action in the bowel and passed rectally. Keep in mind that:  10-18 passages per day are normal  Primary gases are harmless and odorless  Noticeable smells are trace gases related to food intake 7. Foods that are likely to form gas include:  Milk, dairy products, and medications that contain lactose--If your body doesn't produce the enzyme (lactase) to break it down.  Certain vegetables--baked beans, cauliflower, broccoli, cabbage  Certain starches--wheat, oats, corn, potatoes. Rice is a good substitute. 8. Identify offending foods. Reduce or eliminate these gas-forming foods from your diet.   Food Choices for Gastroesophageal Reflux Disease, Adult When you have gastroesophageal reflux disease (GERD), the foods you eat and your eating habits are very important. Choosing the right  foods can help ease your discomfort. What guidelines do I need to follow?  Choose fruits, vegetables, whole grains, and low-fat dairy products.  Choose low-fat meat, fish, and poultry.  Limit fats such as oils, salad dressings, butter, nuts, and avocado.  Keep a food diary. This helps you identify foods that cause symptoms.  Avoid foods that cause symptoms. These may be different for everyone.  Eat small meals often instead of 3 large meals a day.  Eat your meals slowly, in a place where you are relaxed.  Limit fried foods.  Cook foods using methods other than frying.  Avoid drinking alcohol.  Avoid drinking large amounts of liquids with your meals.  Avoid bending over or lying down until 2-3 hours after eating. What foods are not recommended? These are some foods and drinks that may make your symptoms worse: Vegetables  Tomatoes. Tomato juice. Tomato and spaghetti sauce. Chili peppers. Onion and garlic. Horseradish. Fruits  Oranges, grapefruit, and lemon (fruit and juice). Meats  High-fat meats, fish, and poultry. This includes hot dogs, ribs, ham, sausage, salami, and bacon. Dairy  Whole milk and chocolate milk. Sour cream. Cream. Butter. Ice cream. Cream cheese. Drinks  Coffee and tea. Bubbly (carbonated) drinks or energy drinks. Condiments  Hot sauce. Barbecue sauce. Sweets/Desserts  Chocolate and cocoa. Donuts. Peppermint and spearmint. Fats and Oils  High-fat foods. This includes [place] fries and potato chips. Other  Vinegar. Strong spices. This includes black pepper, white pepper, red pepper, cayenne, curry powder, cloves, ginger, and chili powder. The items listed above may not be a complete list of foods and drinks to avoid. Contact your dietitian for more information.  This information  is not intended to replace advice given to you by your health care provider. Make sure you discuss any questions you have with your health care provider. Document Released:  09/01/2011 Document Revised: 08/08/2015 Document Reviewed: 01/04/2013 Elsevier Interactive Patient Education  2017 [employer].

## 2016-07-14 LAB — CBC WITH DIFFERENTIAL/PLATELET
Basophils Absolute: 60 cells/uL (ref 0–200)
Basophils Relative: 1 %
Eosinophils Absolute: 60 cells/uL (ref 15–500)
Eosinophils Relative: 1 %
HCT: 48.8 % (ref 38.5–50.0)
Hemoglobin: 15.9 g/dL (ref 13.2–17.1)
Lymphocytes Relative: 19 %
Lymphs Abs: 1140 cells/uL (ref 850–3900)
MCH: 30 pg (ref 27.0–33.0)
MCHC: 32.6 g/dL (ref 32.0–36.0)
MCV: 92.1 fL (ref 80.0–100.0)
MPV: 11.3 fL (ref 7.5–12.5)
Monocytes Absolute: 600 cells/uL (ref 200–950)
Monocytes Relative: 10 %
Neutro Abs: 4140 cells/uL (ref 1500–7800)
Neutrophils Relative %: 69 %
Platelets: 187 10*3/uL (ref 140–400)
RBC: 5.3 MIL/uL (ref 4.20–5.80)
RDW: 13.1 % (ref 11.0–15.0)
WBC: 6 10*3/uL (ref 3.8–10.8)

## 2016-07-14 LAB — BASIC METABOLIC PANEL WITH GFR
BUN: 19 mg/dL (ref 7–25)
CO2: 28 mmol/L (ref 20–31)
Calcium: 9.5 mg/dL (ref 8.6–10.3)
Chloride: 104 mmol/L (ref 98–110)
Creat: 1.08 mg/dL (ref 0.70–1.11)
GFR, Est African American: 74 mL/min (ref >=60)
GFR, Est Non African American: 64 mL/min (ref >=60)
Glucose, Bld: 90 mg/dL (ref 65–99)
Potassium: 4.3 mmol/L (ref 3.5–5.3)
Sodium: 141 mmol/L (ref 135–146)

## 2016-07-14 LAB — HEPATIC FUNCTION PANEL
ALT: 22 U/L (ref 9–46)
AST: 20 U/L (ref 10–35)
Albumin: 4 g/dL (ref 3.6–5.1)
Alkaline Phosphatase: 61 U/L (ref 40–115)
Bilirubin, Direct: 0.1 mg/dL (ref <=0.2)
Indirect Bilirubin: 0.4 mg/dL (ref 0.2–1.2)
Total Bilirubin: 0.5 mg/dL (ref 0.2–1.2)
Total Protein: 6.3 g/dL (ref 6.1–8.1)

## 2016-07-14 LAB — LIPID PANEL
Cholesterol: 172 mg/dL (ref <200)
HDL: 33 mg/dL — ABNORMAL LOW (ref >40)
LDL Cholesterol: 100 mg/dL — ABNORMAL HIGH (ref <100)
Total CHOL/HDL Ratio: 5.2 Ratio — ABNORMAL HIGH (ref <5.0)
Triglycerides: 194 mg/dL — ABNORMAL HIGH (ref <150)
VLDL: 39 mg/dL — ABNORMAL HIGH (ref <30)

## 2016-07-14 LAB — TSH: TSH: 0.6 mIU/L (ref 0.40–4.50)

## 2016-07-14 NOTE — Progress Notes (Signed)
LVM for pt to return office call for LAB results.

## 2016-07-15 NOTE — Progress Notes (Signed)
LVM for pt to return office call for LAB results.

## 2016-07-15 NOTE — Progress Notes (Signed)
Pt aware of lab results & voiced understanding of those results.

## 2016-08-03 ENCOUNTER — Other Ambulatory Visit: Payer: Self-pay | Admitting: Internal Medicine

## 2016-08-03 MED ORDER — BISOPROLOL-HYDROCHLOROTHIAZIDE 10-6.25 MG PO TABS: 1.0000 | ORAL_TABLET | Freq: Every day | ORAL | 1 refills | Status: DC

## 2016-08-03 MED ORDER — FINASTERIDE 5 MG PO TABS: ORAL_TABLET | ORAL | 1 refills | Status: DC

## 2016-08-21 ENCOUNTER — Telehealth: Payer: Self-pay | Admitting: *Deleted

## 2016-08-21 MED ORDER — AZITHROMYCIN 250 MG PO TABS: ORAL_TABLET | ORAL | 0 refills | Status: AC

## 2016-08-21 MED ORDER — PREDNISONE 10 MG PO TABS: ORAL_TABLET | ORAL | 0 refills | Status: DC

## 2016-08-21 NOTE — Telephone Encounter (Signed)
Patient called and reported he has a sore throat and his right ear feels stopped up. Per Dr Melford Aase, send in an RX for a Z-pak and Prednisone 10 mg.  The patient is aware.

## 2016-09-03 ENCOUNTER — Ambulatory Visit (INDEPENDENT_AMBULATORY_CARE_PROVIDER_SITE_OTHER): Payer: Medicare HMO | Admitting: Physician Assistant

## 2016-09-03 ENCOUNTER — Encounter: Payer: Self-pay | Admitting: Physician Assistant

## 2016-09-03 VITALS — BP 140/80 | HR 79 | Temp 97.6°F | Resp 14 | Ht 66.5 in | Wt 139.2 lb

## 2016-09-03 DIAGNOSIS — R21 Rash and other nonspecific skin eruption: Secondary | ICD-10-CM | POA: Diagnosis not present

## 2016-09-03 LAB — CBC WITH DIFFERENTIAL/PLATELET
Basophils Absolute: 0 cells/uL (ref 0–200)
Basophils Relative: 0 %
Eosinophils Absolute: 164 cells/uL (ref 15–500)
Eosinophils Relative: 2 %
HCT: 49.6 % (ref 38.5–50.0)
Hemoglobin: 16.2 g/dL (ref 13.2–17.1)
Lymphocytes Relative: 21 %
Lymphs Abs: 1722 cells/uL (ref 850–3900)
MCH: 30.1 pg (ref 27.0–33.0)
MCHC: 32.7 g/dL (ref 32.0–36.0)
MCV: 92 fL (ref 80.0–100.0)
MPV: 11.3 fL (ref 7.5–12.5)
Monocytes Absolute: 820 cells/uL (ref 200–950)
Monocytes Relative: 10 %
Neutro Abs: 5494 cells/uL (ref 1500–7800)
Neutrophils Relative %: 67 %
Platelets: 161 10*3/uL (ref 140–400)
RBC: 5.39 MIL/uL (ref 4.20–5.80)
RDW: 13.5 % (ref 11.0–15.0)
WBC: 8.2 10*3/uL (ref 3.8–10.8)

## 2016-09-03 MED ORDER — KETOCONAZOLE 2 % EX CREA: 1.0000 "application " | TOPICAL_CREAM | Freq: Two times a day (BID) | CUTANEOUS | 0 refills | Status: DC

## 2016-09-03 NOTE — Patient Instructions (Signed)
Intertrigo Intertrigo is skin irritation (inflammation) that happens in warm, moist areas of the body. The irritation can cause a rash and make skin raw and itchy. The rash is usually pink or red. It happens mostly between folds of skin or where skin rubs together, such as:  Toes.  Armpits.  Groin.  Belly.  Breasts.  Buttocks.  This condition is not passed from person to person (is not contagious). Follow these instructions at home:  Keep the affected area clean and dry.  Do not scratch your skin.  Stay cool as much as possible. Use an air conditioner or fan, if you can.  Apply over-the-counter and prescription medicines only as told by your doctor.  If you were prescribed an antibiotic medicine, use it as told by your doctor. Do not stop using the antibiotic even if your condition starts to get better.  Keep all follow-up visits as told by your doctor. This is important. How is this prevented?  Stay at a healthy weight.  Keep your feet dry. This is very important if you have diabetes. Wear cotton or wool socks.  Take care of and protect the skin in your groin and butt area as told by your doctor.  Do not wear tight clothes. Wear clothes that: ? Are loose. ? Take away moisture from your body. ? Are made of cotton.  Wear a bra that gives good support, if needed.  Shower and dry yourself fully after being active.  Keep your blood sugar under control if you have diabetes. Contact a doctor if:  Your symptoms do not get better with treatment.  Your symptoms get worse or they spread.  You notice more redness and warmth.  You have a fever. This information is not intended to replace advice given to you by your health care provider. Make sure you discuss any questions you have with your health care provider. Document Released: 04/04/2010 Document Revised: 08/08/2015 Document Reviewed: 09/03/2014 Elsevier Interactive Patient Education  2018 [employer]  

## 2016-09-03 NOTE — Progress Notes (Signed)
   Subjective:    Patient ID: Matthew Aguirre, male    DOB: 05-03-1933, 83 y.o.   MRN: [id number]  HPI 83 y.o. WM presents with left hip rash.  Was taking shower last night and noticed rash on right hip, no warmth, no tenderness, no blisters, pain, itching. No fever, chills. Has had a few ticks pulled off in last several months but on less than a day.   Blood pressure 140/80, pulse 79, temperature 97.6 F (36.4 C), resp. rate 14, height 5' 6.5" (1.689 m), weight 139 lb 3.2 oz (63.1 kg), SpO2 97 %.  Medications Current Outpatient Prescriptions on File Prior to Visit  Medication Sig  . BABY ASPIRIN PO Take 81 mg by mouth daily.  . bisoprolol-hydrochlorothiazide (ZIAC) 10-6.25 MG tablet Take 1 tablet by mouth daily.  . Cholecalciferol (VITAMIN D PO) Take 2,000 Int'l Units by mouth 2 (two) times daily.   . finasteride (PROSCAR) 5 MG tablet TAKE 1 TABLET EVERY DAY FOR BLOOD PRESSURE AND PROSTATE  . Flaxseed, Linseed, (FLAX SEED OIL PO) Take 1,200 mg by mouth 3 (three) times daily.   . Magnesium 250 MG TABS Take 250 mg by mouth daily.  . Omega-3 Fatty Acids (FISH OIL PO) Take 1,000 mg by mouth 2 (two) times daily.    No current facility-administered medications on file prior to visit.     Problem list He has Essential hypertension; Pulmonary nodule (2008 resolved on f/u) ; Hyperlipidemia; Vitamin D deficiency; Medication management; Benign prostatic hyperplasia; Pulmonary sarcoidosis (1988) ; Elevated PSA; Other abnormal glucose; and Body mass index (BMI) of 20.0-20.9 in adult on his problem list.   Review of Systems  Constitutional: Negative for chills, fatigue and fever.  Gastrointestinal: Negative for nausea and vomiting.  Skin: Positive for rash.  Neurological: Negative for numbness.       Objective:   Physical Exam  Constitutional: He appears well-developed and well-nourished.  HENT:  Head: Normocephalic and atraumatic.  Eyes: Conjunctivae are normal. Pupils are equal,  round, and reactive to light.  Neck: Normal range of motion. Neck supple.  Cardiovascular: Normal rate and regular rhythm.   Pulmonary/Chest: Effort normal and breath sounds normal.  Abdominal: Soft. Bowel sounds are normal.  Musculoskeletal: Normal range of motion.  Neurological: He is alert.  Skin: Skin is warm and dry. Rash (left posterior buttocks and hip, does cross the midline, erythematous well demarcated raised macule/papular, No vesicles, petechia, purpura or tenderness, nor warmth. ) noted.      Assessment & Plan:  Rash ? Fungal, rule out tick borne illness, check labs Ketoconazole and hydrocortisone - CBC with Differential/Platelet - BASIC METABOLIC PANEL WITH GFR - Rocky mtn spotted fvr abs pnl(IgG+IgM) - Lyme Aby, Wstrn. Blt. IgG & IgM w/bands - Ehrlichia antibody panel - ketoconazole (NIZORAL) 2 % cream; Apply 1 application topically 2 (two) times daily.  Dispense: 60 g; Refill: 0  FU 1 month if not better

## 2016-09-04 LAB — BASIC METABOLIC PANEL WITH GFR
BUN: 21 mg/dL (ref 7–25)
CO2: 25 mmol/L (ref 20–31)
Calcium: 9.3 mg/dL (ref 8.6–10.3)
Chloride: 104 mmol/L (ref 98–110)
Creat: 1.12 mg/dL — ABNORMAL HIGH (ref 0.70–1.11)
GFR, Est African American: 71 mL/min (ref >=60)
GFR, Est Non African American: 61 mL/min (ref >=60)
Glucose, Bld: 85 mg/dL (ref 65–99)
Potassium: 4.1 mmol/L (ref 3.5–5.3)
Sodium: 141 mmol/L (ref 135–146)

## 2016-09-04 LAB — ROCKY MTN SPOTTED FVR ABS PNL(IGG+IGM)
RMSF IgG: NOT DETECTED
RMSF IgM: NOT DETECTED

## 2016-09-06 LAB — EHRLICHIA ANTIBODY PANEL
E chaffeensis (HGE) Ab, IgG: <1:64 {titer}
E chaffeensis (HGE) Ab, IgM: <1:20 {titer}

## 2016-09-09 LAB — LYME ABY, WSTRN BLT IGG & IGM W/BANDS

## 2016-09-09 NOTE — Progress Notes (Signed)
Pt aware of lab results & voiced understanding of those results.

## 2016-09-10 ENCOUNTER — Ambulatory Visit (INDEPENDENT_AMBULATORY_CARE_PROVIDER_SITE_OTHER): Payer: Medicare HMO | Admitting: *Deleted

## 2016-09-10 ENCOUNTER — Other Ambulatory Visit: Payer: Self-pay | Admitting: *Deleted

## 2016-09-10 ENCOUNTER — Other Ambulatory Visit: Payer: Self-pay | Admitting: Internal Medicine

## 2016-09-10 DIAGNOSIS — R319 Hematuria, unspecified: Secondary | ICD-10-CM

## 2016-09-10 DIAGNOSIS — N390 Urinary tract infection, site not specified: Secondary | ICD-10-CM | POA: Diagnosis not present

## 2016-09-10 MED ORDER — CIPROFLOXACIN HCL 500 MG PO TABS: 500.0000 mg | ORAL_TABLET | Freq: Two times a day (BID) | ORAL | 0 refills | Status: DC

## 2016-09-10 NOTE — Addendum Note (Signed)
Addended by: Melbourne Abts C on: 09/10/2016 02:30 PM   Modules accepted: Orders

## 2016-09-10 NOTE — Progress Notes (Signed)
Patient here for a NV to check a urinalysis and culture.  Patient states he has had burning with urination and had a small amount of blood in his urine this morning,  He has been pushing fluids.  An RX for Cipro 500 mg has been sent to his pharmacy.

## 2016-09-11 LAB — URINALYSIS, MICROSCOPIC ONLY
Bacteria, UA: NONE SEEN [HPF]
Casts: NONE SEEN [LPF]
Crystals: NONE SEEN [HPF]
RBC / HPF: NONE SEEN RBC/HPF (ref <=2)
Yeast: NONE SEEN [HPF]

## 2016-09-11 LAB — URINALYSIS, ROUTINE W REFLEX MICROSCOPIC
Bilirubin Urine: NEGATIVE
Glucose, UA: NEGATIVE
Ketones, ur: NEGATIVE
Nitrite: NEGATIVE
Protein, ur: NEGATIVE
Specific Gravity, Urine: 1.005 (ref 1.001–1.035)
pH: 7 (ref 5.0–8.0)

## 2016-09-11 LAB — URINE CULTURE: Organism ID, Bacteria: NO GROWTH

## 2016-09-17 DIAGNOSIS — C61 Malignant neoplasm of prostate: Secondary | ICD-10-CM | POA: Diagnosis not present

## 2016-09-17 DIAGNOSIS — R310 Gross hematuria: Secondary | ICD-10-CM | POA: Diagnosis not present

## 2016-10-28 ENCOUNTER — Encounter: Payer: Self-pay | Admitting: Internal Medicine

## 2016-10-28 ENCOUNTER — Ambulatory Visit (INDEPENDENT_AMBULATORY_CARE_PROVIDER_SITE_OTHER): Payer: Medicare HMO | Admitting: Internal Medicine

## 2016-10-28 ENCOUNTER — Other Ambulatory Visit: Payer: Self-pay | Admitting: *Deleted

## 2016-10-28 VITALS — BP 124/82 | HR 72 | Temp 97.6°F | Resp 18 | Ht 66.25 in | Wt 140.2 lb

## 2016-10-28 DIAGNOSIS — Z136 Encounter for screening for cardiovascular disorders: Secondary | ICD-10-CM | POA: Diagnosis not present

## 2016-10-28 DIAGNOSIS — Z1212 Encounter for screening for malignant neoplasm of rectum: Secondary | ICD-10-CM

## 2016-10-28 DIAGNOSIS — E782 Mixed hyperlipidemia: Secondary | ICD-10-CM | POA: Diagnosis not present

## 2016-10-28 DIAGNOSIS — E559 Vitamin D deficiency, unspecified: Secondary | ICD-10-CM | POA: Diagnosis not present

## 2016-10-28 DIAGNOSIS — Z0000 Encounter for general adult medical examination without abnormal findings: Secondary | ICD-10-CM

## 2016-10-28 DIAGNOSIS — N401 Enlarged prostate with lower urinary tract symptoms: Secondary | ICD-10-CM | POA: Diagnosis not present

## 2016-10-28 DIAGNOSIS — Z125 Encounter for screening for malignant neoplasm of prostate: Secondary | ICD-10-CM | POA: Diagnosis not present

## 2016-10-28 DIAGNOSIS — R7303 Prediabetes: Secondary | ICD-10-CM | POA: Diagnosis not present

## 2016-10-28 DIAGNOSIS — Z0001 Encounter for general adult medical examination with abnormal findings: Secondary | ICD-10-CM

## 2016-10-28 DIAGNOSIS — Z79899 Other long term (current) drug therapy: Secondary | ICD-10-CM

## 2016-10-28 DIAGNOSIS — I10 Essential (primary) hypertension: Secondary | ICD-10-CM | POA: Diagnosis not present

## 2016-10-28 DIAGNOSIS — Z1211 Encounter for screening for malignant neoplasm of colon: Secondary | ICD-10-CM | POA: Diagnosis not present

## 2016-10-28 DIAGNOSIS — N390 Urinary tract infection, site not specified: Secondary | ICD-10-CM

## 2016-10-28 MED ORDER — FINASTERIDE 5 MG PO TABS: ORAL_TABLET | ORAL | 1 refills | Status: DC

## 2016-10-28 MED ORDER — BISOPROLOL-HYDROCHLOROTHIAZIDE 10-6.25 MG PO TABS: 1.0000 | ORAL_TABLET | Freq: Every day | ORAL | 1 refills | Status: DC

## 2016-10-28 NOTE — Patient Instructions (Signed)

## 2016-10-28 NOTE — Progress Notes (Signed)
[facility]   Unk Pinto, M.D.      [name], P.A.-C [facility]                [address] [place], [place] [address] Telephone [phone] Telefax [phone] Annual  Screening/Preventative Visit  & Comprehensive Evaluation & Examination     This very nice 83 y.o. WWM presents for a Screening/Preventative Visit & comprehensive evaluation and management of multiple medical co-morbidities.  Patient has been followed for HTN, T2_NIDDM  Prediabetes, Hyperlipidemia and Vitamin D Deficiency.  Patient has hx/o Gleason 6 Prostate Cancer (2014 ) followed on Finasteride by active surveillance per Dr Junious Silk.     HTN predates since 2003. Patient's BP has been controlled at home.  Today's BP is at goal - 124/82. Patient denies any cardiac symptoms as chest pain, palpitations, shortness of breath, dizziness or ankle swelling.     Patient's hyperlipidemia is controlled with diet and medications. Patient denies myalgias or other medication SE's. Last lipids were at goal albeit elevated Trig's: Lab Results  Component Value Date   CHOL 172 07/13/2016   HDL 33 (L) 07/13/2016   LDLCALC 100 (H) 07/13/2016   TRIG 194 (H) 07/13/2016   CHOLHDL 5.2 (H) 07/13/2016      Patient has prediabetes with A1c 5.8% since 2008 and patient denies reactive hypoglycemic symptoms, visual blurring, diabetic polys or paresthesias. Last A1c was at goal: Lab Results  Component Value Date   HGBA1C 5.4 04/13/2016       Finally, patient has history of Vitamin D Deficiency ("45" on treatment 2008) and last vitamin D was at goal: Lab Results  Component Value Date   VD25OH 69 04/13/2016   Medication Sig  . BABY ASPIRIN PO Take 81 mg by mouth daily.  Marland Kitchen VITAMIN D  Take 2,000 Int'l Units by mouth 2 (two) times daily.   Marland Kitchen FLAX SEED OIL  Take 1,200 mg by mouth 3 (three) times daily.   . Magnesium 250 MG TABS Take 250 mg by mouth  daily.  . Omega-3FISH OIL Take 1,200 mg by mouth 2 (two) times daily.   . bisoprolol-hctz 10-6.25  Take 1 tablet by mouth daily.  . finasteride  5 MG tablet TAKE 1 TABLET EVERY DAY FOR   . NIZORAL 2 % cream Apply 1 application topically 2 (two) times daily.   Allergies  Allergen Reactions  . Pravastatin   . Red Yeast Rice [Cholestin]   . Sulfa Antibiotics   . Vibramycin [Doxycycline Calcium]   . Zetia [Ezetimibe]    Past Medical History:  Diagnosis Date  . Essential hypertension 02/15/2007  . Hyperlipidemia 07/13/2013  . Prediabetes 07/13/2013  . Vitamin D deficiency 07/13/2013   Health Maintenance  Topic Date Due  . INFLUENZA VACCINE  10/14/2016  . TETANUS/TDAP  05/16/2025  . PNA vac Low Risk Adult  Completed   Immunization History  Administered Date(s) Administered  . DT 05/17/2015  . Influenza, High Dose Seasonal PF 01/23/2014, 02/13/2015, 11/27/2015  . Pneumococcal Conjugate-13 10/12/2013  . Pneumococcal Polysaccharide-23 05/17/2015   Past Surgical History:  Procedure Laterality Date  . TONSILECTOMY/ADENOIDECTOMY WITH MYRINGOTOMY    . TREATMENT FISTULA ANAL     Family History  Problem Relation Age of Onset  . Hyperlipidemia Brother   . Hypertension Brother    Social History   Social History  . Marital status:  Widowed  . Number of children: None  . Years of education: College grad   Occupational History  . 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 Smoking status: Never Smoker  . Smokeless tobacco: Never Used  . Alcohol use No  . Drug use: No  . Sexual activity: Widowed    ROS Constitutional: Denies fever, chills, weight loss/gain, headaches, insomnia,  night sweats or change in appetite. Does c/o fatigue. Eyes: Denies redness, blurred vision, diplopia, discharge, itchy or watery eyes.  ENT: Denies discharge, congestion, post nasal drip, epistaxis, sore throat, earache, hearing loss, dental pain, Tinnitus,  Vertigo, Sinus pain or snoring.  Cardio: Denies chest pain, palpitations, irregular heartbeat, syncope, dyspnea, diaphoresis, orthopnea, PND, claudication or edema Respiratory: denies cough, dyspnea, DOE, pleurisy, hoarseness, laryngitis or wheezing.  Gastrointestinal: Denies dysphagia, heartburn, reflux, water brash, pain, cramps, nausea, vomiting, bloating, diarrhea, constipation, hematemesis, melena, hematochezia, jaundice or hemorrhoids Genitourinary: Denies dysuria, frequency, urgency, nocturia, hesitancy, discharge, hematuria or flank pain Musculoskeletal: Denies arthralgia, myalgia, stiffness, Jt. Swelling, pain, limp or strain/sprain. Denies Falls. Skin: Denies puritis, rash, hives, warts, acne, eczema or change in skin lesion Neuro: No weakness, tremor, incoordination, spasms, paresthesia or pain Psychiatric: Denies confusion, memory loss or sensory loss. Denies Depression. Endocrine: Denies change in weight, skin, hair change, nocturia, and paresthesia, diabetic polys, visual blurring or hyper / hypo glycemic episodes.  Heme/Lymph: No excessive bleeding, bruising or enlarged lymph nodes.  Physical Exam  BP 124/82   Pulse 72   Temp 97.6 F (36.4 C)   Resp 18   Ht 5' 6.25" (1.683 m)   Wt 140 lb 3.2 oz (63.6 kg)   BMI 22.46 kg/m   General Appearance: Well nourished and well groomed and in no apparent distress.  Eyes: PERRLA, EOMs, conjunctiva no swelling or erythema, normal fundi and vessels. Sinuses: No frontal/maxillary tenderness ENT/Mouth: EACs patent / TMs  nl. Nares clear without erythema, swelling, mucoid exudates. Oral hygiene is good. No erythema, swelling, or exudate. Tongue normal, non-obstructing. Tonsils not swollen or erythematous. Hearing normal.  Neck: Supple, thyroid normal. No bruits, nodes or JVD. Respiratory: Respiratory effort normal.  BS equal and clear bilateral without rales, rhonci, wheezing or stridor. Cardio: Heart sounds are normal with regular rate  and rhythm and no murmurs, rubs or gallops. Peripheral pulses are normal and equal bilaterally without edema. No aortic or femoral bruits. Chest: symmetric with normal excursions and percussion.  Abdomen: Soft, with Nl bowel sounds. Nontender, no guarding, rebound, hernias, masses, or organomegaly.  Lymphatics: Non tender without lymphadenopathy.  Genitourinary: DRE - per Dr Junious Silk Musculoskeletal: Full ROM all peripheral extremities, joint stability, 5/5 strength, and normal gait. Skin: Warm and dry without rashes, lesions, cyanosis, clubbing or  ecchymosis.  Neuro: Cranial nerves intact, reflexes equal bilaterally. Normal muscle tone, no cerebellar symptoms. Sensation intact.  Pysch: Alert and oriented X 3 with normal affect, insight and judgment appropriate.   Assessment and Plan  1. Annual Preventative/Screening Exam   2. Essential hypertension  - EKG 12-Lead - [place], RETROPERITNL ABD,  LTD - Urinalysis, Routine w reflex microscopic - Microalbumin / creatinine urine ratio - CBC with Differential/Platelet - BASIC METABOLIC PANEL WITH GFR - Magnesium - TSH  3. Hyperlipidemia, mixed  - EKG 12-Lead - [place], RETROPERITNL ABD,  LTD - Hepatic function panel - Lipid panel - TSH  4. Prediabetes  - EKG 12-Lead - [place], RETROPERITNL ABD,  LTD - Hemoglobin A1c - Insulin, random  5. Vitamin D deficiency  - VITAMIN D 25 Hydroxy   6. Benign prostatic hyperplasia with lower urinary tract symptoms, symptom details unspecified  - PSA  7. Prostate cancer screening  - PSA  8. Screening for rectal cancer  - POC Hemoccult Bld/Stl   9. Screening for ischemic heart disease  - EKG 12-Lead  10. Screening for AAA (aortic abdominal aneurysm)  - [place], RETROPERITNL ABD,  LTD  11. Medication management  - Urinalysis, Routine w reflex microscopic - Microalbumin / creatinine urine ratio - CBC with Differential/Platelet - BASIC METABOLIC PANEL WITH GFR - Hepatic function panel -  Magnesium - Lipid panel - TSH - Hemoglobin A1c - Insulin, random - VITAMIN D 25 Hydroxy        Patient was counseled in prudent diet, weight control to achieve/maintain BMI less than 25, BP monitoring, regular exercise and medications as discussed.  Discussed med effects and SE's. Routine screening labs and tests as requested with regular follow-up as recommended. Over 40 minutes of exam, counseling, chart review and high complex critical decision making was performed

## 2016-10-29 LAB — TSH: TSH: 1.02 mIU/L (ref 0.40–4.50)

## 2016-10-29 LAB — HEMOGLOBIN A1C
Hgb A1c MFr Bld: 5.5 % of total Hgb (ref <5.7)
Mean Plasma Glucose: 111 (calc)
eAG (mmol/L): 6.2 (calc)

## 2016-10-29 LAB — BASIC METABOLIC PANEL WITH GFR
BUN: 18 mg/dL (ref 7–25)
CO2: 29 mmol/L (ref 20–32)
Calcium: 9.4 mg/dL (ref 8.6–10.3)
Chloride: 103 mmol/L (ref 98–110)
Creat: 0.97 mg/dL (ref 0.70–1.11)
GFR, Est African American: 85 mL/min/{1.73_m2} (ref >=60)
GFR, Est Non African American: 73 mL/min/{1.73_m2} (ref >=60)
Glucose, Bld: 89 mg/dL (ref 65–99)
Potassium: 4 mmol/L (ref 3.5–5.3)
Sodium: 139 mmol/L (ref 135–146)

## 2016-10-29 LAB — CBC WITH DIFFERENTIAL/PLATELET
Basophils Absolute: 28 cells/uL (ref 0–200)
Basophils Relative: 0.5 %
Eosinophils Absolute: 28 cells/uL (ref 15–500)
Eosinophils Relative: 0.5 %
HCT: 49.9 % (ref 38.5–50.0)
Hemoglobin: 16.7 g/dL (ref 13.2–17.1)
Lymphs Abs: 1070 cells/uL (ref 850–3900)
MCH: 30.2 pg (ref 27.0–33.0)
MCHC: 33.5 g/dL (ref 32.0–36.0)
MCV: 90.2 fL (ref 80.0–100.0)
MPV: 11.7 fL (ref 7.5–12.5)
Monocytes Relative: 9.5 %
Neutro Abs: 3942 cells/uL (ref 1500–7800)
Neutrophils Relative %: 70.4 %
Platelets: 202 10*3/uL (ref 140–400)
RBC: 5.53 10*6/uL (ref 4.20–5.80)
RDW: 12.6 % (ref 11.0–15.0)
Total Lymphocyte: 19.1 %
WBC mixed population: 532 cells/uL (ref 200–950)
WBC: 5.6 10*3/uL (ref 3.8–10.8)

## 2016-10-29 LAB — INSULIN, RANDOM: Insulin: 15 u[IU]/mL (ref 2.0–19.6)

## 2016-10-29 LAB — HEPATIC FUNCTION PANEL
AG Ratio: 2.1 (calc) (ref 1.0–2.5)
ALT: 18 U/L (ref 9–46)
AST: 18 U/L (ref 10–35)
Albumin: 4.2 g/dL (ref 3.6–5.1)
Alkaline phosphatase (APISO): 63 U/L (ref 40–115)
Bilirubin, Direct: 0.1 mg/dL (ref 0.0–0.2)
Globulin: 2 g/dL (calc) (ref 1.9–3.7)
Indirect Bilirubin: 0.6 mg/dL (calc) (ref 0.2–1.2)
Total Bilirubin: 0.7 mg/dL (ref 0.2–1.2)
Total Protein: 6.2 g/dL (ref 6.1–8.1)

## 2016-10-29 LAB — LIPID PANEL
Cholesterol: 171 mg/dL (ref <200)
HDL: 36 mg/dL — ABNORMAL LOW (ref >40)
LDL Cholesterol (Calc): 109 mg/dL (calc) — ABNORMAL HIGH
Non-HDL Cholesterol (Calc): 135 mg/dL (calc) — ABNORMAL HIGH (ref <130)
Total CHOL/HDL Ratio: 4.8 (calc) (ref <5.0)
Triglycerides: 148 mg/dL (ref <150)

## 2016-10-29 LAB — URINALYSIS, ROUTINE W REFLEX MICROSCOPIC
Bilirubin Urine: NEGATIVE
Glucose, UA: NEGATIVE
Hgb urine dipstick: NEGATIVE
Ketones, ur: NEGATIVE
Leukocytes, UA: NEGATIVE
Nitrite: NEGATIVE
Protein, ur: NEGATIVE
Specific Gravity, Urine: 1.016 (ref 1.001–1.03)
pH: 7 (ref 5.0–8.0)

## 2016-10-29 LAB — PSA: PSA: 8.1 ng/mL — ABNORMAL HIGH (ref <=4.0)

## 2016-10-29 LAB — MICROALBUMIN / CREATININE URINE RATIO
Creatinine, Urine: 105 mg/dL (ref 20–370)
Microalb Creat Ratio: 2 mcg/mg creat (ref <30)
Microalb, Ur: 0.2 mg/dL

## 2016-10-29 LAB — URINE CULTURE
MICRO NUMBER:: 80883015
Result:: NO GROWTH
SPECIMEN QUALITY:: ADEQUATE

## 2016-10-29 LAB — MAGNESIUM: Magnesium: 2 mg/dL (ref 1.5–2.5)

## 2016-10-29 LAB — VITAMIN D 25 HYDROXY (VIT D DEFICIENCY, FRACTURES): Vit D, 25-Hydroxy: 80 ng/mL (ref 30–100)

## 2016-11-09 ENCOUNTER — Encounter: Payer: Self-pay | Admitting: Internal Medicine

## 2016-11-11 ENCOUNTER — Other Ambulatory Visit: Payer: Self-pay

## 2016-11-11 DIAGNOSIS — Z1212 Encounter for screening for malignant neoplasm of rectum: Secondary | ICD-10-CM

## 2016-11-11 LAB — POC HEMOCCULT BLD/STL (HOME/3-CARD/SCREEN)
Card #2 Fecal Occult Blod, POC: NEGATIVE
Card #3 Fecal Occult Blood, POC: NEGATIVE
Fecal Occult Blood, POC: NEGATIVE

## 2016-11-18 DIAGNOSIS — C61 Malignant neoplasm of prostate: Secondary | ICD-10-CM | POA: Diagnosis not present

## 2016-11-18 DIAGNOSIS — R310 Gross hematuria: Secondary | ICD-10-CM | POA: Diagnosis not present

## 2016-11-18 DIAGNOSIS — N400 Enlarged prostate without lower urinary tract symptoms: Secondary | ICD-10-CM | POA: Diagnosis not present

## 2016-11-24 ENCOUNTER — Encounter: Payer: Self-pay | Admitting: Internal Medicine

## 2016-11-24 ENCOUNTER — Ambulatory Visit (INDEPENDENT_AMBULATORY_CARE_PROVIDER_SITE_OTHER): Payer: Medicare HMO | Admitting: Internal Medicine

## 2016-11-24 VITALS — BP 132/82 | HR 67 | Temp 97.7°F | Ht 65.65 in | Wt 141.0 lb

## 2016-11-24 DIAGNOSIS — I10 Essential (primary) hypertension: Secondary | ICD-10-CM | POA: Diagnosis not present

## 2016-11-24 DIAGNOSIS — D485 Neoplasm of uncertain behavior of skin: Secondary | ICD-10-CM | POA: Diagnosis not present

## 2016-11-24 DIAGNOSIS — C44319 Basal cell carcinoma of skin of other parts of face: Secondary | ICD-10-CM | POA: Diagnosis not present

## 2016-11-24 DIAGNOSIS — C44310 Basal cell carcinoma of skin of unspecified parts of face: Secondary | ICD-10-CM

## 2016-11-24 DIAGNOSIS — L820 Inflamed seborrheic keratosis: Secondary | ICD-10-CM

## 2016-11-24 NOTE — Progress Notes (Signed)
   Subjective:    Patient ID: Matthew Aguirre, male    DOB: 1933-07-26, 83 y.o.   MRN: [id number]  HPI   This very nice 83 yo WWM with HTN presents for recheck. Hypertensive systems review is negative. Patient also has concerns re: a pink fleshy lesion of his Lt upper forehead gradually increasing in size. Also, he has concerns about a pruritic crusty brown lesion of his Rt cheek .   Outpatient Medications Prior to Visit  Medication Sig Dispense Refill  . BABY ASPIRIN PO Take 81 mg by mouth daily.    . bisoprolol-hydrochlorothiazide (ZIAC) 10-6.25 MG tablet Take 1 tablet by mouth daily. 90 tablet 1  . Cholecalciferol (VITAMIN D PO) Take 2,000 Int'l Units by mouth 2 (two) times daily.     . finasteride (PROSCAR) 5 MG tablet TAKE 1 TABLET EVERY DAY FOR  PROSTATE 90 tablet 1  . Flaxseed, Linseed, (FLAX SEED OIL PO) Take 1,200 mg by mouth 3 (three) times daily.     . Magnesium 250 MG TABS Take 250 mg by mouth daily.    . Omega-3 Fatty Acids (FISH OIL PO) Take 1,200 mg by mouth 2 (two) times daily.      No facility-administered medications prior to visit.    Allergies  Allergen Reactions  . Pravastatin   . Red Yeast Rice [Cholestin]   . Sulfa Antibiotics   . Vibramycin [Doxycycline Calcium]   . Zetia [Ezetimibe]    Past Medical History:  Diagnosis Date  . Essential hypertension 02/15/2007  . Hyperlipidemia 07/13/2013  . Prediabetes 07/13/2013  . Vitamin D deficiency 07/13/2013   Review of Systems  10 point systems review negative except as above.    Objective:   Physical Exam  BP 132/82   Pulse 67   Temp 97.7 F (36.5 C)   Ht 5' 5.65" (1.668 m)   Wt 141 lb (64 kg)   SpO2 99%   BMI 23.00 kg/m   HEENT - WNL. Neck - supple.  Chest - Clear equal BS. Cor - Nl HS. RRR w/o sig MGR. PP 1(+). No edema. MS- FROM w/o deformities.  Gait Nl. Neuro -  Nl w/o focal abnormalities.  Skin - (1) 5-6 mm x 5-6 mm raised smooth pink fleshy lesion of the Lt upper forehead. (2) 5 lesion of  the Rt cheek appearing to be an irritated seborrheic keratosis. mm x 5 mm brown crusty irritated    After informed consent and aseptic prep with alcohol and local anesthesia with Marcaine 0.5% to both above lesions.   Procedure  (1) (CPT 66599) With a #10 scalpel,  the 1st lesion was excised full thickness in toto to the subcut in a transverse elliptical fashion to align with the  Forehead rhytids. Then the skin edges were aligned and everted with # 3 vertical mattress sutures & 2 interrupted sutures w/ 4-0 Nylon. Specimen sent for Pathology to r/o BCE.  (2) (CPT 11311) The 2sd lesion was excised by shave technique with a #10 scalpel and then lightly hyfrecated for hemostasis and fulguration of any remnant lesion.   Antibiotic ointment and sterile bandages were applied. Patient was instructed in post-op wound care and to RTC 5-6 days for suture removal.     Assessment & Plan:   1. Essential hypertension   2. Neoplasm of uncertain behavior of skin of forehead  - Dermatology pathology  3. Seborrheic keratoses, inflamed

## 2016-12-01 ENCOUNTER — Ambulatory Visit (INDEPENDENT_AMBULATORY_CARE_PROVIDER_SITE_OTHER): Payer: Medicare HMO | Admitting: Internal Medicine

## 2016-12-01 VITALS — BP 116/66 | HR 72 | Temp 97.7°F | Resp 16 | Ht 66.5 in | Wt 140.0 lb

## 2016-12-01 DIAGNOSIS — Z23 Encounter for immunization: Secondary | ICD-10-CM

## 2016-12-01 DIAGNOSIS — C44310 Basal cell carcinoma of skin of unspecified parts of face: Secondary | ICD-10-CM

## 2016-12-01 NOTE — Progress Notes (Signed)
      Patient returns 1 week s/p excision of a suspect lesion of his Left upper forehead.  Healing ridge is adequate and there's no sign of infection.  Path returned BCE w/ clear margins. All sutures removed and steri strips applied.

## 2017-01-20 DIAGNOSIS — R69 Illness, unspecified: Secondary | ICD-10-CM | POA: Diagnosis not present

## 2017-02-17 DIAGNOSIS — C61 Malignant neoplasm of prostate: Secondary | ICD-10-CM | POA: Diagnosis not present

## 2017-03-22 DIAGNOSIS — Z961 Presence of intraocular lens: Secondary | ICD-10-CM | POA: Diagnosis not present

## 2017-03-22 DIAGNOSIS — H524 Presbyopia: Secondary | ICD-10-CM | POA: Diagnosis not present

## 2017-03-23 DIAGNOSIS — H52209 Unspecified astigmatism, unspecified eye: Secondary | ICD-10-CM | POA: Diagnosis not present

## 2017-03-23 DIAGNOSIS — H524 Presbyopia: Secondary | ICD-10-CM | POA: Diagnosis not present

## 2017-03-23 DIAGNOSIS — H5213 Myopia, bilateral: Secondary | ICD-10-CM | POA: Diagnosis not present

## 2017-03-31 DIAGNOSIS — C61 Malignant neoplasm of prostate: Secondary | ICD-10-CM | POA: Diagnosis not present

## 2017-03-31 DIAGNOSIS — N400 Enlarged prostate without lower urinary tract symptoms: Secondary | ICD-10-CM | POA: Diagnosis not present

## 2017-04-03 ENCOUNTER — Other Ambulatory Visit: Payer: Self-pay | Admitting: Internal Medicine

## 2017-04-03 MED ORDER — PROMETHAZINE-DM 6.25-15 MG/5ML PO SYRP: ORAL_SOLUTION | ORAL | 1 refills | Status: DC

## 2017-04-03 MED ORDER — AZITHROMYCIN 250 MG PO TABS: ORAL_TABLET | ORAL | 0 refills | Status: DC

## 2017-04-03 MED ORDER — PREDNISONE 10 MG PO TABS: ORAL_TABLET | ORAL | 0 refills | Status: DC

## 2017-04-04 NOTE — Progress Notes (Signed)
Assessment and Plan:  Kean was seen today for uri.  Diagnoses and all orders for this visit:  Nasopharyngitis Symptoms improving; - Continue with zpak, prednisone Suggested symptomatic OTC remedies. Nasal saline spray for congestion. Nasal steroids, allergy pill, oral steroids Follow up as needed.  Further disposition pending results of labs. Discussed med's effects and SE's.   Over 15 minutes of exam, counseling, chart review, and critical decision making was performed.   Future Appointments  Date Time Provider [place]  04/30/2017  8:45 AM Liane Comber, NP [facility] None  12/01/2017 10:00 AM Unk Pinto, MD [facility] None    ------------------------------------------------------------------------------------------------------------------   HPI BP 138/82   Pulse 84   Temp 97.9 F (36.6 C)   Ht 5' 6.5" (1.689 m)   Wt 146 lb 12.8 oz (66.6 kg)   SpO2 99%   BMI 23.34 kg/m   84 y.o.male presents for dry/progressing cough, with mild fever on 1 day - he called Dr. Melford Aase over the weekend after he made this appointment and was started on Azithromycin, prednisone taper. He reports symptoms significantly improved, but continues to have head congestion with some crackles in ears bilaterally.  He is prescribed promethazine DM cough syrup for cough which has been effective. He is also doing regular nasal saline flushes, steam baths. He is not using flonase.   He was started on claritin last week.   Past Medical History:  Diagnosis Date  . Essential hypertension 02/15/2007  . Hyperlipidemia 07/13/2013  . Prediabetes 07/13/2013  . Vitamin D deficiency 07/13/2013     Allergies  Allergen Reactions  . Pravastatin   . Red Yeast Rice [Cholestin]   . Sulfa Antibiotics   . Vibramycin [Doxycycline Calcium]   . Zetia [Ezetimibe]     Current Outpatient Medications on File Prior to Visit  Medication Sig  . azithromycin (ZITHROMAX) 250 MG tablet Take 2 tablets (500  mg) on  Day 1,  followed by 1 tablet (250 mg) once daily on Days 2 through 5.  . BABY ASPIRIN PO Take 81 mg by mouth daily.  . bisoprolol-hydrochlorothiazide (ZIAC) 10-6.25 MG tablet Take 1 tablet by mouth daily.  . Cholecalciferol (VITAMIN D PO) Take 2,000 Int'l Units by mouth 2 (two) times daily.   . finasteride (PROSCAR) 5 MG tablet TAKE 1 TABLET EVERY DAY FOR  PROSTATE  . Flaxseed, Linseed, (FLAX SEED OIL PO) Take 1,200 mg by mouth 3 (three) times daily.   . Magnesium 250 MG TABS Take 250 mg by mouth daily.  . Omega-3 Fatty Acids (FISH OIL PO) Take 1,200 mg by mouth 2 (two) times daily.   . predniSONE (DELTASONE) 10 MG tablet 1 tab 3 x day for 2 days, then 1 tab 2 x day for 2 days, then 1 tab 1 x day for 3 days  . promethazine-dextromethorphan (PROMETHAZINE-DM) 6.25-15 MG/5ML syrup Take 1 to 2 tsp enery 4 hours if needed for cough   No current facility-administered medications on file prior to visit.     ROS: Review of Systems  Constitutional: Negative for chills, diaphoresis, fever and malaise/fatigue.  HENT: Positive for congestion. Negative for ear discharge, ear pain, hearing loss, sinus pain, sore throat and tinnitus.   Eyes: Negative for blurred vision, pain, discharge and redness.  Respiratory: Positive for cough. Negative for hemoptysis, sputum production, shortness of breath, wheezing and stridor.   Cardiovascular: Negative for chest pain, palpitations and orthopnea.  Gastrointestinal: Negative for abdominal pain, diarrhea, nausea and vomiting.  Genitourinary: Negative.   Musculoskeletal:  Negative for joint pain and myalgias.  Skin: Negative for rash.  Neurological: Negative for dizziness, sensory change, weakness and headaches.  Endo/Heme/Allergies: Negative for environmental allergies.  Psychiatric/Behavioral: Negative.   All other systems reviewed and are negative.    Physical Exam:  BP 138/82   Pulse 84   Temp 97.9 F (36.6 C)   Ht 5' 6.5" (1.689 m)   Wt 146 lb  12.8 oz (66.6 kg)   SpO2 99%   BMI 23.34 kg/m   General Appearance: Well nourished, in no apparent distress. Eyes: PERRLA, EOMs, conjunctiva no swelling or erythema Sinuses: No Frontal/maxillary tenderness ENT/Mouth: Ext aud canals clear, TMs without erythema, bulging. No erythema, swelling, or exudate on post pharynx.  Tonsils not swollen or erythematous. Hearing normal.  Neck: Supple, thyroid normal.  Respiratory: Respiratory effort normal, BS equal bilaterally without rales, rhonchi, wheezing or stridor.  Cardio: RRR with no MRGs. Brisk peripheral pulses without edema.  Abdomen: Soft, + BS.  Non tender, no guarding, rebound, hernias, masses. Lymphatics: Non tender without lymphadenopathy.  Musculoskeletal: Full ROM, 5/5 strength, normal gait.  Skin: Warm, dry without rashes, lesions, ecchymosis.  Neuro:  Normal muscle tone, no cerebellar symptoms. Psych: Awake and oriented X 3, normal affect, Insight and Judgment appropriate.     Izora Ribas, NP 9:37 AM [facility]

## 2017-04-05 ENCOUNTER — Encounter: Payer: Self-pay | Admitting: Adult Health

## 2017-04-05 ENCOUNTER — Ambulatory Visit (INDEPENDENT_AMBULATORY_CARE_PROVIDER_SITE_OTHER): Payer: Medicare HMO | Admitting: Adult Health

## 2017-04-05 VITALS — BP 138/82 | HR 84 | Temp 97.9°F | Ht 66.5 in | Wt 146.8 lb

## 2017-04-05 DIAGNOSIS — J00 Acute nasopharyngitis [common cold]: Secondary | ICD-10-CM | POA: Diagnosis not present

## 2017-04-05 NOTE — Patient Instructions (Signed)

## 2017-04-19 ENCOUNTER — Telehealth: Payer: Self-pay

## 2017-04-19 NOTE — Telephone Encounter (Signed)
Patient states that he is still coughing, raspy dry feeling in throat. Can you call in more meds or does he need to seen again. Patient was seen on 04/05/17. Had been taking Zpak and Prednisone that had been previously presribed on the 19th.

## 2017-04-19 NOTE — Telephone Encounter (Signed)
Spoke with patient. States he has a tickle in the back of his throat. Is aware that this can take several weeks to feeling better. Patient also advised if there was any dyspnea, swelling in legs, malaise and fatigue and any type of chest pain to contact us for a re-evaluation.

## 2017-04-29 NOTE — Progress Notes (Signed)
FOLLOW UP  Assessment and Plan:   Hypertension Well controlled with current medications  Monitor blood pressure at home; patient to call if consistently greater than 130/80 Continue DASH diet.   Reminder to go to the ER if any CP, SOB, nausea, dizziness, severe HA, changes vision/speech, left arm numbness and tingling and jaw pain.  Cholesterol Currently at goal by lifestyle - Continue monitoring -  Continue low cholesterol diet and exercise.  Check lipid panel.   Other abnormal glucose Last A1C check at goal  Continue diet and exercise.  Perform daily foot/skin check, notify office of any concerning changes.  Check A1C  Normal weight for adult - Long discussion about diet, and exercise Recommended diet heavy in fruits and veggies and low in animal meats, cheeses, and dairy products, appropriate calorie intake Will follow up in 3 months  Vitamin D Def At goal at last visit; continue supplementation to maintain goal of 70-100 Defer Vit D level  BPH Currently asymptomatic on finasteride Continue follow up with urology for watchful monitoring  Cough Start allegra and ranitidine daily x 2 weeks Information for various causes of cough provided Patient to call back if cough is not improving  Continue diet and meds as discussed. Further disposition pending results of labs. Discussed med's effects and SE's.   Over 30 minutes of exam, counseling, chart review, and critical decision making was performed.   Future Appointments  Date Time Provider [place]  04/30/2017  8:45 AM Liane Comber, NP [facility] None  12/01/2017 10:00 AM Unk Pinto, MD [facility] None    ----------------------------------------------------------------------------------------------------------------------  HPI 84 y.o. male  presents for 6 month follow up on hypertension, cholesterol, glucose management, weight and vitamin D deficiency. He does report ongoing mild cough after a recent  URI a few weeks ago.   BMI is Body mass index is 23.05 kg/m., he has been working on diet and exercise. Wt Readings from Last 3 Encounters:  04/30/17 145 lb (65.8 kg)  04/05/17 146 lb 12.8 oz (66.6 kg)  12/01/16 140 lb (63.5 kg)   His blood pressure has been controlled at home, today their BP is BP: 128/74  He does workout. He denies chest pain, shortness of breath, dizziness.   He is not on cholesterol medication and denies myalgias. His cholesterol is very close to goal. The cholesterol last visit was:   Lab Results  Component Value Date   CHOL 171 10/28/2016   HDL 36 (L) 10/28/2016   LDLCALC 100 (H) 07/13/2016   TRIG 148 10/28/2016   CHOLHDL 4.8 10/28/2016    He has been working on diet and exercise for glucose management, and denies increased appetite, nausea, paresthesia of the feet, polydipsia, polyuria, visual disturbances and vomiting. Last A1C in the office was:  Lab Results  Component Value Date   HGBA1C 5.5 10/28/2016   Patient is on Vitamin D supplement and at goal:    Lab Results  Component Value Date   VD25OH 80 10/28/2016        Current Medications:  Current Outpatient Medications on File Prior to Visit  Medication Sig  . BABY ASPIRIN PO Take 81 mg by mouth daily.  . Cholecalciferol (VITAMIN D PO) Take 2,000 Int'l Units by mouth 2 (two) times daily.   . Flaxseed, Linseed, (FLAX SEED OIL PO) Take 1,200 mg by mouth 3 (three) times daily.   . Magnesium 250 MG TABS Take 250 mg by mouth daily.  . Omega-3 Fatty Acids (FISH OIL PO) Take 1,200  mg by mouth 2 (two) times daily.   . predniSONE (DELTASONE) 10 MG tablet 1 tab 3 x day for 2 days, then 1 tab 2 x day for 2 days, then 1 tab 1 x day for 3 days  . promethazine-dextromethorphan (PROMETHAZINE-DM) 6.25-15 MG/5ML syrup Take 1 to 2 tsp enery 4 hours if needed for cough  . azithromycin (ZITHROMAX) 250 MG tablet Take 2 tablets (500 mg) on  Day 1,  followed by 1 tablet (250 mg) once daily on Days 2 through 5.   No  current facility-administered medications on file prior to visit.      Allergies:  Allergies  Allergen Reactions  . Pravastatin   . Red Yeast Rice [Cholestin]   . Sulfa Antibiotics   . Vibramycin [Doxycycline Calcium]   . Zetia [Ezetimibe]      Medical History:  Past Medical History:  Diagnosis Date  . Essential hypertension 02/15/2007  . Hyperlipidemia 07/13/2013  . Prediabetes 07/13/2013  . Vitamin D deficiency 07/13/2013   Family history- Reviewed and unchanged Social history- Reviewed and unchanged   Review of Systems:  Review of Systems  Constitutional: Negative for malaise/fatigue and weight loss.  HENT: Negative for hearing loss and tinnitus.   Eyes: Negative for blurred vision and double vision.  Respiratory: Positive for cough (Dry, mild, intermittent). Negative for shortness of breath and wheezing.   Cardiovascular: Negative for chest pain, palpitations, orthopnea, claudication and leg swelling.  Gastrointestinal: Negative for abdominal pain, blood in stool, constipation, diarrhea, heartburn, melena, nausea and vomiting.  Genitourinary: Negative.   Musculoskeletal: Negative for joint pain and myalgias.  Skin: Negative for rash.  Neurological: Negative for dizziness, tingling, sensory change, weakness and headaches.  Endo/Heme/Allergies: Negative for polydipsia.  Psychiatric/Behavioral: Negative.   All other systems reviewed and are negative.   Physical Exam: BP 128/74   Pulse 73   Temp 97.7 F (36.5 C)   Ht 5' 6.5" (1.689 m)   Wt 145 lb (65.8 kg)   SpO2 99%   BMI 23.05 kg/m  Wt Readings from Last 3 Encounters:  04/30/17 145 lb (65.8 kg)  04/05/17 146 lb 12.8 oz (66.6 kg)  12/01/16 140 lb (63.5 kg)   General Appearance: Well nourished, in no apparent distress. Eyes: PERRLA, EOMs, conjunctiva no swelling or erythema Sinuses: No Frontal/maxillary tenderness ENT/Mouth: Ext aud canals clear, TMs without erythema, bulging. No erythema, swelling, or exudate  on post pharynx.  Tonsils not swollen or erythematous. Hearing normal.  Neck: Supple, thyroid normal.  Respiratory: Respiratory effort normal, BS equal bilaterally without rales, rhonchi, wheezing or stridor.  Cardio: RRR with no MRGs. Brisk peripheral pulses without edema.  Abdomen: Soft, + BS.  Non tender, no guarding, rebound, hernias, masses. Lymphatics: Non tender without lymphadenopathy.  Musculoskeletal: Full ROM, 5/5 strength, Normal gait Skin: Warm, dry without rashes, lesions, ecchymosis.  Neuro: Cranial nerves intact. No cerebellar symptoms.  Psych: Awake and oriented X 3, normal affect, Insight and Judgment appropriate.    Izora Ribas, NP 8:43 AM [facility]

## 2017-04-30 ENCOUNTER — Encounter: Payer: Self-pay | Admitting: Adult Health

## 2017-04-30 ENCOUNTER — Ambulatory Visit (INDEPENDENT_AMBULATORY_CARE_PROVIDER_SITE_OTHER): Payer: Medicare HMO | Admitting: Adult Health

## 2017-04-30 VITALS — BP 128/74 | HR 73 | Temp 97.7°F | Ht 66.5 in | Wt 145.0 lb

## 2017-04-30 DIAGNOSIS — N401 Enlarged prostate with lower urinary tract symptoms: Secondary | ICD-10-CM

## 2017-04-30 DIAGNOSIS — E559 Vitamin D deficiency, unspecified: Secondary | ICD-10-CM | POA: Diagnosis not present

## 2017-04-30 DIAGNOSIS — E782 Mixed hyperlipidemia: Secondary | ICD-10-CM | POA: Diagnosis not present

## 2017-04-30 DIAGNOSIS — I10 Essential (primary) hypertension: Secondary | ICD-10-CM | POA: Diagnosis not present

## 2017-04-30 DIAGNOSIS — Z79899 Other long term (current) drug therapy: Secondary | ICD-10-CM

## 2017-04-30 DIAGNOSIS — R7309 Other abnormal glucose: Secondary | ICD-10-CM

## 2017-04-30 MED ORDER — BISOPROLOL-HYDROCHLOROTHIAZIDE 10-6.25 MG PO TABS: 1.0000 | ORAL_TABLET | Freq: Every day | ORAL | 1 refills | Status: DC

## 2017-04-30 MED ORDER — FINASTERIDE 5 MG PO TABS: ORAL_TABLET | ORAL | 1 refills | Status: DC

## 2017-04-30 NOTE — Patient Instructions (Signed)
Consider allegra/fexafinadine  Consider taking ranitidine/zantac regularly for a few weeks    Common causes of cough OR hoarseness OR sore throat:   Allergies, Viral Infections, Acid Reflux and Bacterial Infections.   Allergies and viral infections cause a cough OR sore throat by post nasal drip and are often worse at night, can also have sneezing, lower grade fevers, clear/yellow mucus. This is best treated with allergy medications or nasal sprays.  Please get on allegra for 1-2 weeks The strongest is allegra or fexafinadine  Cheapest at [employer], [employer], [employer]   Bacterial infections are more severe than allergies or viral infections with fever, teeth pain, fatigue. This can be treated with prednisone and the same over the counter medication and after 7 days can be treated with an antibiotic.   Silent reflux/GERD can cause a cough OR sore throat OR hoarseness WITHOUT heart burn because the esophagus that goes to the stomach and trachea that goes to the lungs are very close and when you lay down the acid can irritate your throat and lungs. This can cause hoarseness, cough, and wheezing. Please stop any alcohol or anti-inflammatories like aleve/advil/ibuprofen and start an over the counter Prilosec or omeprazole 1-2 times daily 83mins before food for 2 weeks, then switch to over the counter zantac/ratinidine or pepcid/famotadine once at night for 2 weeks.    sometimes irritation causes more irritation. Try voice rest, use sugar free cough drops to prevent coughing, and try to stop clearing your throat.   If you ever have a cough that does not go away after trying these things please make a follow up visit for further evaluation or we can refer you to a specialist. Or if you ever have shortness of breath or chest pain go to the ER.

## 2017-05-01 LAB — BASIC METABOLIC PANEL WITH GFR
BUN: 14 mg/dL (ref 7–25)
CO2: 28 mmol/L (ref 20–32)
Calcium: 9.6 mg/dL (ref 8.6–10.3)
Chloride: 105 mmol/L (ref 98–110)
Creat: 1.1 mg/dL (ref 0.70–1.11)
GFR, Est African American: 72 mL/min/{1.73_m2} (ref >=60)
GFR, Est Non African American: 62 mL/min/{1.73_m2} (ref >=60)
Glucose, Bld: 103 mg/dL — ABNORMAL HIGH (ref 65–99)
Potassium: 4.2 mmol/L (ref 3.5–5.3)
Sodium: 141 mmol/L (ref 135–146)

## 2017-05-01 LAB — LIPID PANEL
Cholesterol: 179 mg/dL (ref <200)
HDL: 36 mg/dL — ABNORMAL LOW (ref >40)
LDL Cholesterol (Calc): 113 mg/dL (calc) — ABNORMAL HIGH
Non-HDL Cholesterol (Calc): 143 mg/dL (calc) — ABNORMAL HIGH (ref <130)
Total CHOL/HDL Ratio: 5 (calc) — ABNORMAL HIGH (ref <5.0)
Triglycerides: 188 mg/dL — ABNORMAL HIGH (ref <150)

## 2017-05-01 LAB — CBC WITH DIFFERENTIAL/PLATELET
Basophils Absolute: 31 cells/uL (ref 0–200)
Basophils Relative: 0.6 %
Eosinophils Absolute: 31 cells/uL (ref 15–500)
Eosinophils Relative: 0.6 %
HCT: 45.9 % (ref 38.5–50.0)
Hemoglobin: 15.8 g/dL (ref 13.2–17.1)
Lymphs Abs: 1178 cells/uL (ref 850–3900)
MCH: 31 pg (ref 27.0–33.0)
MCHC: 34.4 g/dL (ref 32.0–36.0)
MCV: 90.2 fL (ref 80.0–100.0)
MPV: 11.6 fL (ref 7.5–12.5)
Monocytes Relative: 9.8 %
Neutro Abs: 3361 cells/uL (ref 1500–7800)
Neutrophils Relative %: 65.9 %
Platelets: 170 10*3/uL (ref 140–400)
RBC: 5.09 10*6/uL (ref 4.20–5.80)
RDW: 12.4 % (ref 11.0–15.0)
Total Lymphocyte: 23.1 %
WBC mixed population: 500 cells/uL (ref 200–950)
WBC: 5.1 10*3/uL (ref 3.8–10.8)

## 2017-05-01 LAB — HEPATIC FUNCTION PANEL
AG Ratio: 1.7 (calc) (ref 1.0–2.5)
ALT: 30 U/L (ref 9–46)
AST: 22 U/L (ref 10–35)
Albumin: 4 g/dL (ref 3.6–5.1)
Alkaline phosphatase (APISO): 53 U/L (ref 40–115)
Bilirubin, Direct: 0.1 mg/dL (ref 0.0–0.2)
Globulin: 2.3 g/dL (calc) (ref 1.9–3.7)
Indirect Bilirubin: 0.5 mg/dL (calc) (ref 0.2–1.2)
Total Bilirubin: 0.6 mg/dL (ref 0.2–1.2)
Total Protein: 6.3 g/dL (ref 6.1–8.1)

## 2017-05-01 LAB — VITAMIN D 25 HYDROXY (VIT D DEFICIENCY, FRACTURES): Vit D, 25-Hydroxy: 72 ng/mL (ref 30–100)

## 2017-05-01 LAB — HEMOGLOBIN A1C
Hgb A1c MFr Bld: 5.5 % of total Hgb (ref <5.7)
Mean Plasma Glucose: 111 (calc)
eAG (mmol/L): 6.2 (calc)

## 2017-05-01 LAB — TSH: TSH: 0.87 mIU/L (ref 0.40–4.50)

## 2017-05-03 ENCOUNTER — Other Ambulatory Visit: Payer: Self-pay | Admitting: *Deleted

## 2017-05-03 DIAGNOSIS — N401 Enlarged prostate with lower urinary tract symptoms: Secondary | ICD-10-CM

## 2017-05-03 MED ORDER — FINASTERIDE 5 MG PO TABS: ORAL_TABLET | ORAL | 1 refills | Status: DC

## 2017-07-28 DIAGNOSIS — R69 Illness, unspecified: Secondary | ICD-10-CM | POA: Diagnosis not present

## 2017-08-11 NOTE — Progress Notes (Signed)
MEDICARE ANNUAL WELLNESS VISIT AND FOLLOW UP Assessment:   Diagnoses and all orders for this visit:  Encounter for Medicare annual wellness exam  Essential hypertension Continue medication Monitor blood pressure at home; call if consistently over 130/80 Continue DASH diet.   Reminder to go to the ER if any CP, SOB, nausea, dizziness, severe HA, changes vision/speech, left arm numbness and tingling and jaw pain.  Pulmonary sarcoidosis (1988)  Followed by pulmonology  Benign prostatic hyperplasia with lower urinary tract symptoms, symptom details unspecified Followed annually by Dr. Junious Silk  Vitamin D deficiency At goal at recent check; continue to recommend supplementation for goal of 70-100 Defer vitamin D level  Pulmonary nodule (2008 resolved on f/u)  Resolved  Other abnormal glucose Recent A1Cs at goal Discussed diet/exercise, weight management  Defer A1C; check BMP  Medication management CBC, CMP/GFR  Hyperlipidemia No longer treated due to age Mildly elevated only at last check  Continue cholesterol diet/exercise Defer lipid panel  Elevated PSA Monitor annually or as needed   BMI 23.0-23.9, adult Continue to recommend diet heavy in fruits and veggies and low in animal meats, cheeses, and dairy products, appropriate calorie intake Discuss exercise recommendations routinely Continue to monitor weight at each visit     Over 30 minutes of exam, counseling, chart review, and critical decision making was performed  Future Appointments  Date Time Provider [place]  12/01/2017 10:00 AM Unk Pinto, MD [facility] None     Plan:   During the course of the visit the patient was educated and counseled about appropriate screening and preventive services including:    Pneumococcal vaccine   Influenza vaccine  Prevnar 13  Td vaccine  Screening electrocardiogram  Colorectal cancer screening  Diabetes screening  Glaucoma  screening  Nutrition counseling    Subjective:  Matthew Aguirre is a 84 y.o. male who presents for Medicare Annual Wellness Visit and 3 month follow up for HTN, hyperlipidemia, glucose management, and vitamin D Def. Patient has hx/o Gleason 6 Prostate Cancer (2014 ) followed on Finasteride by active surveillance per Dr Junious Silk.  BMI is Body mass index is 23.69 kg/m., he has been working on diet and exercise. Wt Readings from Last 3 Encounters:  08/12/17 149 lb (67.6 kg)  04/30/17 145 lb (65.8 kg)  04/05/17 146 lb 12.8 oz (66.6 kg)   His blood pressure has been controlled at home (120/60), today their BP is BP: (!) 148/84 He does workout. He denies chest pain, shortness of breath, dizziness.   He is not on cholesterol medication secondary to age. His cholesterol is not at goal. The cholesterol last visit was:   Lab Results  Component Value Date   CHOL 179 04/30/2017   HDL 36 (L) 04/30/2017   LDLCALC 113 (H) 04/30/2017   TRIG 188 (H) 04/30/2017   CHOLHDL 5.0 (H) 04/30/2017   He has been working on diet and exercise for glucose management, and denies foot ulcerations, increased appetite, nausea, paresthesia of the feet, polydipsia, polyuria, visual disturbances, vomiting and weight loss. Last A1C in the office was:  Lab Results  Component Value Date   HGBA1C 5.5 04/30/2017   Last GFR Lab Results  Component Value Date   GFRNONAA 62 04/30/2017    Patient is on Vitamin D supplement and at goal:    Lab Results  Component Value Date   VD25OH 72 04/30/2017      Medication Review:   Current Outpatient Medications (Cardiovascular):  .  bisoprolol-hydrochlorothiazide (ZIAC) 10-6.25 MG tablet,  Take 1 tablet by mouth daily.  Current Outpatient Medications (Respiratory):  .  promethazine-dextromethorphan (PROMETHAZINE-DM) 6.25-15 MG/5ML syrup, Take 1 to 2 tsp enery 4 hours if needed for cough (Patient not taking: Reported on 08/12/2017)  Current Outpatient Medications  (Analgesics):  Marland Kitchen  BABY ASPIRIN PO, Take 81 mg by mouth daily.   Current Outpatient Medications (Other):  Marland Kitchen  Cholecalciferol (VITAMIN D PO), Take 2,000 Int'l Units by mouth 2 (two) times daily.  .  finasteride (PROSCAR) 5 MG tablet, TAKE 1 TABLET EVERY DAY FOR  PROSTATE .  Flaxseed, Linseed, (FLAX SEED OIL PO), Take 1,200 mg by mouth 3 (three) times daily.  .  Magnesium 250 MG TABS, Take 250 mg by mouth daily. .  Omega-3 Fatty Acids (FISH OIL PO), Take 1,200 mg by mouth 2 (two) times daily.   Allergies: Allergies  Allergen Reactions  . Pravastatin   . Red Yeast Rice [Cholestin]   . Sulfa Antibiotics   . Vibramycin [Doxycycline Calcium]   . Zetia [Ezetimibe]     Current Problems (verified) has Essential hypertension; Pulmonary nodule (2008 resolved on f/u) ; Hyperlipidemia; Vitamin D deficiency; Medication management; Benign prostatic hyperplasia; Pulmonary sarcoidosis (1988) ; Elevated PSA; Other abnormal glucose; and BMI 23.0-23.9, adult on their problem list.  Screening Tests Immunization History  Administered Date(s) Administered  . DT 05/17/2015  . Influenza, High Dose Seasonal PF 01/23/2014, 02/13/2015, 11/27/2015, 12/01/2016  . Pneumococcal Conjugate-13 10/12/2013  . Pneumococcal Polysaccharide-23 05/17/2015   Preventative care: Last colonoscopy: 2014, declines further screenings  Prior vaccinations: TD or Tdap: 2017  Influenza: 2018  Pneumococcal:2017 Prevnar13: 2015 Shingles/Zostavax: Will call insurance company about it  Names of Other Physician/Practitioners you currently use: 1. [facility] here for primary care 2. Dr. Gershon Crane, eye doctor, last visit 03/2017 3. Dr. Orvil Feil, dentist, last visit 2018  Patient Care Team: Unk Pinto, MD as PCP - General (Internal Medicine) Festus Aloe, MD as Consulting Physician (Urology) Inda Castle, MD (Inactive) as Consulting Physician (Gastroenterology) Rutherford Guys, MD  as Consulting Physician (Ophthalmology)  Surgical: He  has a past surgical history that includes Treatment fistula anal and Tonsilectomy/adenoidectomy with myringotomy. Family His family history includes Hyperlipidemia in his brother; Hypertension in his brother. Social history  He reports that he has never smoked. He has never used smokeless tobacco. He reports that he does not drink alcohol. His drug history is not on file.  MEDICARE WELLNESS OBJECTIVES: Physical activity: Current Exercise Habits: Home exercise routine, Type of exercise: walking, Time (Minutes): 20, Frequency (Times/Week): 7, Weekly Exercise (Minutes/Week): 140, Intensity: Moderate, Exercise limited by: None identified Cardiac risk factors: Cardiac Risk Factors include: dyslipidemia;male gender;hypertension;advanced age (>15men, >29 women) Depression/mood screen:   Depression screen [facility] 2/9 08/12/2017  Decreased Interest 0  Down, Depressed, Hopeless 0  PHQ - 2 Score 0    ADLs:  In your present state of health, do you have any difficulty performing the following activities: 08/12/2017 10/28/2016  Hearing? N N  Vision? N N  Difficulty concentrating or making decisions? N N  Walking or climbing stairs? N N  Dressing or bathing? N N  Doing errands, shopping? N N  Preparing Food and eating ? N -  Using the Toilet? N -  In the past six months, have you accidently leaked urine? N -  Do you have problems with loss of bowel control? N -  Managing your Medications? N -  Managing your Finances? N -  Housekeeping or managing your Housekeeping? N -  Some recent data might be hidden     Cognitive Testing  Alert? Yes  Normal Appearance?Yes  Oriented to person? Yes  Place? Yes   Time? Yes  Recall of three objects?  Yes  Can perform simple calculations? Yes  Displays appropriate judgment?Yes  Can read the correct time from a watch face?Yes  EOL planning: Does Patient Have a Medical Advance Directive?: Yes Type of Advance  Directive: Healthcare Power of Attorney, Living will Does patient want to make changes to medical advance directive?: No - Patient declined Copy of [place] in Chart?: No - copy requested   Objective:   Today's Vitals   08/12/17 1035  BP: (!) 148/84  Pulse: 69  Temp: (!) 97.3 F (36.3 C)  SpO2: 99%  Weight: 149 lb (67.6 kg)  Height: 5' 6.5" (1.689 m)   Body mass index is 23.69 kg/m.  General appearance: alert, no distress, WD/WN, male HEENT: normocephalic, sclerae anicteric, TMs pearly, nares patent, no discharge or erythema, pharynx normal Oral cavity: MMM, no lesions Neck: supple, no lymphadenopathy, no thyromegaly, no masses Heart: RRR, normal S1, S2, no murmurs Lungs: CTA bilaterally, no wheezes, rhonchi, or rales Abdomen: +bs, soft, non tender, non distended, no masses, no hepatomegaly, no splenomegaly Musculoskeletal: nontender, no swelling, no obvious deformity Extremities: no edema, no cyanosis, no clubbing Pulses: 2+ symmetric, upper and lower extremities, normal cap refill Neurological: alert, oriented x 3, CN2-12 intact, strength normal upper extremities and lower extremities, sensation normal throughout, DTRs 2+ throughout, no cerebellar signs, gait normal Psychiatric: normal affect, behavior normal, pleasant   Medicare Attestation I have personally reviewed: The patient's medical and social history Their use of alcohol, tobacco or illicit drugs Their current medications and supplements The patient's functional ability including ADLs,fall risks, home safety risks, cognitive, and hearing and visual impairment Diet and physical activities Evidence for depression or mood disorders  The patient's weight, height, BMI, and visual acuity have been recorded in the chart.  I have made referrals, counseling, and provided education to the patient based on review of the above and I have provided the patient with a written personalized care plan for  preventive services.     Izora Ribas, NP   08/12/2017

## 2017-08-12 ENCOUNTER — Encounter: Payer: Self-pay | Admitting: Adult Health

## 2017-08-12 ENCOUNTER — Ambulatory Visit (INDEPENDENT_AMBULATORY_CARE_PROVIDER_SITE_OTHER): Payer: Medicare HMO | Admitting: Adult Health

## 2017-08-12 VITALS — BP 136/82 | HR 69 | Temp 97.3°F | Ht 66.5 in | Wt 149.0 lb

## 2017-08-12 DIAGNOSIS — I10 Essential (primary) hypertension: Secondary | ICD-10-CM

## 2017-08-12 DIAGNOSIS — R911 Solitary pulmonary nodule: Secondary | ICD-10-CM

## 2017-08-12 DIAGNOSIS — E782 Mixed hyperlipidemia: Secondary | ICD-10-CM

## 2017-08-12 DIAGNOSIS — Z6823 Body mass index (BMI) 23.0-23.9, adult: Secondary | ICD-10-CM | POA: Diagnosis not present

## 2017-08-12 DIAGNOSIS — E559 Vitamin D deficiency, unspecified: Secondary | ICD-10-CM | POA: Diagnosis not present

## 2017-08-12 DIAGNOSIS — N401 Enlarged prostate with lower urinary tract symptoms: Secondary | ICD-10-CM

## 2017-08-12 DIAGNOSIS — R972 Elevated prostate specific antigen [PSA]: Secondary | ICD-10-CM

## 2017-08-12 DIAGNOSIS — Z0001 Encounter for general adult medical examination with abnormal findings: Secondary | ICD-10-CM | POA: Diagnosis not present

## 2017-08-12 DIAGNOSIS — R7309 Other abnormal glucose: Secondary | ICD-10-CM

## 2017-08-12 DIAGNOSIS — Z79899 Other long term (current) drug therapy: Secondary | ICD-10-CM

## 2017-08-12 DIAGNOSIS — Z0000 Encounter for general adult medical examination without abnormal findings: Secondary | ICD-10-CM

## 2017-08-12 DIAGNOSIS — D860 Sarcoidosis of lung: Secondary | ICD-10-CM | POA: Diagnosis not present

## 2017-08-12 DIAGNOSIS — R6889 Other general symptoms and signs: Secondary | ICD-10-CM | POA: Diagnosis not present

## 2017-08-12 NOTE — Patient Instructions (Addendum)
Call insurance company to ask about shingles vaccine - shingrix (2 part) is best, but we could also do zostavax    Aim for 7+ servings of fruits and vegetables daily  80+ fluid ounces of water or unsweet tea for healthy kidneys  1 drink of alcohol per day  Limit animal fats in diet for cholesterol and heart health - choose grass fed whenever available  Aim for low stress - take time to unwind and care for your mental health  Aim for 150 min of moderate intensity exercise weekly for heart health, and weights twice weekly for bone health  Aim for 7-9 hours of sleep daily      When it comes to diets, agreement about the perfect plan isn't easy to find, even among the experts. Experts at the [employer] developed an idea known as the Healthy Eating Plate. Just imagine a plate divided into logical, healthy portions.  The emphasis is on diet quality:  Load up on vegetables and fruits - one-half of your plate: Aim for color and variety, and remember that potatoes don't count.  Go for whole grains - one-quarter of your plate: Whole wheat, barley, wheat berries, quinoa, oats, brown rice, and foods made with them. If you want pasta, go with whole wheat pasta.  Protein power - one-quarter of your plate: Fish, chicken, beans, and nuts are all healthy, versatile protein sources. Limit red meat.  The diet, however, does go beyond the plate, offering a few other suggestions.  Use healthy plant oils, such as olive, canola, soy, corn, sunflower and peanut. Check the labels, and avoid partially hydrogenated oil, which have unhealthy trans fats.  If you're thirsty, drink water. Coffee and tea are good in moderation, but skip sugary drinks and limit milk and dairy products to one or two daily servings.  The type of carbohydrate in the diet is more important than the amount. Some sources of carbohydrates, such as vegetables, fruits, whole grains, and beans-are healthier than  others.  Finally, stay active.

## 2017-08-13 LAB — CBC WITH DIFFERENTIAL/PLATELET
Basophils Absolute: 58 cells/uL (ref 0–200)
Basophils Relative: 0.9 %
Eosinophils Absolute: 58 cells/uL (ref 15–500)
Eosinophils Relative: 0.9 %
HCT: 48 % (ref 38.5–50.0)
Hemoglobin: 16.8 g/dL (ref 13.2–17.1)
Lymphs Abs: 1357 cells/uL (ref 850–3900)
MCH: 30.9 pg (ref 27.0–33.0)
MCHC: 35 g/dL (ref 32.0–36.0)
MCV: 88.4 fL (ref 80.0–100.0)
MPV: 11.3 fL (ref 7.5–12.5)
Monocytes Relative: 12.3 %
Neutro Abs: 4141 cells/uL (ref 1500–7800)
Neutrophils Relative %: 64.7 %
Platelets: 191 10*3/uL (ref 140–400)
RBC: 5.43 10*6/uL (ref 4.20–5.80)
RDW: 12.1 % (ref 11.0–15.0)
Total Lymphocyte: 21.2 %
WBC mixed population: 787 cells/uL (ref 200–950)
WBC: 6.4 10*3/uL (ref 3.8–10.8)

## 2017-08-13 LAB — COMPLETE METABOLIC PANEL WITH GFR
AG Ratio: 1.8 (calc) (ref 1.0–2.5)
ALT: 41 U/L (ref 9–46)
AST: 28 U/L (ref 10–35)
Albumin: 4.4 g/dL (ref 3.6–5.1)
Alkaline phosphatase (APISO): 63 U/L (ref 40–115)
BUN: 17 mg/dL (ref 7–25)
CO2: 27 mmol/L (ref 20–32)
Calcium: 9.9 mg/dL (ref 8.6–10.3)
Chloride: 105 mmol/L (ref 98–110)
Creat: 1.04 mg/dL (ref 0.70–1.11)
GFR, Est African American: 77 mL/min/{1.73_m2} (ref >=60)
GFR, Est Non African American: 67 mL/min/{1.73_m2} (ref >=60)
Globulin: 2.5 g/dL (calc) (ref 1.9–3.7)
Glucose, Bld: 89 mg/dL (ref 65–99)
Potassium: 4.7 mmol/L (ref 3.5–5.3)
Sodium: 140 mmol/L (ref 135–146)
Total Bilirubin: 0.7 mg/dL (ref 0.2–1.2)
Total Protein: 6.9 g/dL (ref 6.1–8.1)

## 2017-08-13 LAB — TSH: TSH: 0.98 mIU/L (ref 0.40–4.50)

## 2017-09-21 DIAGNOSIS — C61 Malignant neoplasm of prostate: Secondary | ICD-10-CM | POA: Diagnosis not present

## 2017-09-28 DIAGNOSIS — N400 Enlarged prostate without lower urinary tract symptoms: Secondary | ICD-10-CM | POA: Diagnosis not present

## 2017-09-28 DIAGNOSIS — C61 Malignant neoplasm of prostate: Secondary | ICD-10-CM | POA: Diagnosis not present

## 2017-10-04 ENCOUNTER — Ambulatory Visit: Payer: Medicare HMO | Admitting: Internal Medicine

## 2017-10-04 ENCOUNTER — Encounter: Payer: Self-pay | Admitting: Internal Medicine

## 2017-10-04 VITALS — BP 130/76 | HR 77 | Temp 97.9°F | Resp 14 | Ht 66.5 in | Wt 147.2 lb

## 2017-10-04 DIAGNOSIS — M25552 Pain in left hip: Secondary | ICD-10-CM

## 2017-10-04 NOTE — Progress Notes (Addendum)
  Subjective:    Patient ID: Matthew Aguirre, male    DOB: 06/22/1933, 84 y.o.   MRN: [id number]  HPI  This very nice 84 yo WWM presents with c/o a "catch" if his Lt posterior hip and called 4-5 days ago to schedule today's appointment. Since then his discomfort has completely resolved. Outpatient Medications Prior to Visit  Medication Sig Dispense Refill  . BABY ASPIRIN PO Take 81 mg by mouth daily.    . bisoprolol-hydrochlorothiazide (ZIAC) 10-6.25 MG tablet Take 1 tablet by mouth daily. 90 tablet 1  . Cholecalciferol (VITAMIN D PO) Take 2,000 Int'l Units by mouth 2 (two) times daily.     . finasteride (PROSCAR) 5 MG tablet TAKE 1 TABLET EVERY DAY FOR  PROSTATE 90 tablet 1  . Flaxseed, Linseed, (FLAX SEED OIL PO) Take 1,200 mg by mouth 3 (three) times daily.     . Magnesium 250 MG TABS Take 250 mg by mouth daily.    . Omega-3 Fatty Acids (FISH OIL PO) Take 1,200 mg by mouth 2 (two) times daily.     . promethazine-dextromethorphan (PROMETHAZINE-DM) 6.25-15 MG/5ML syrup Take 1 to 2 tsp enery 4 hours if needed for cough (Patient not taking: Reported on 10/04/2017) 360 mL 1   No facility-administered medications prior to visit.    Allergies  Allergen Reactions  . Pravastatin   . Red Yeast Rice [Cholestin]   . Sulfa Antibiotics   . Vibramycin [Doxycycline Calcium]   . Zetia [Ezetimibe]    Past Medical History:  Diagnosis Date  . Essential hypertension 02/15/2007  . Hyperlipidemia 07/13/2013  . Prediabetes 07/13/2013  . Vitamin D deficiency 07/13/2013   Past Surgical History:  Procedure Laterality Date  . TONSILECTOMY/ADENOIDECTOMY WITH MYRINGOTOMY    . TREATMENT FISTULA ANAL     Review of Systems    10 point systems review negative except as above.    Objective:   Physical Exam  BP 130/76   Pulse 77   Temp 97.9 F (36.6 C)   Resp 14   Ht 5' 6.5" (1.689 m)   Wt 147 lb 3.2 oz (66.8 kg)   SpO2 99%   BMI 23.40 kg/m   HEENT - WNL. Neck - supple.  Chest - Clear equal  BS. Cor - Nl HS. RRR w/o sig MGR. PP 1(+). No edema. MS- FROM w/o deformities. Internal/extermal rotation & abduction & figure 4 crossing Lt leg is unrestricted and not uncomfortable. Gait Nl. Neuro -  Nl w/o focal abnormalities.    Assessment & Plan:   1. Pain of left hip joint, resolved  ( Reassured & No Charge OV today)      .

## 2017-10-14 DIAGNOSIS — R310 Gross hematuria: Secondary | ICD-10-CM | POA: Diagnosis not present

## 2017-10-14 DIAGNOSIS — C61 Malignant neoplasm of prostate: Secondary | ICD-10-CM | POA: Diagnosis not present

## 2017-10-28 ENCOUNTER — Other Ambulatory Visit: Payer: Self-pay | Admitting: Internal Medicine

## 2017-10-28 DIAGNOSIS — N401 Enlarged prostate with lower urinary tract symptoms: Secondary | ICD-10-CM

## 2017-10-29 ENCOUNTER — Other Ambulatory Visit: Payer: Self-pay | Admitting: *Deleted

## 2017-10-29 DIAGNOSIS — R310 Gross hematuria: Secondary | ICD-10-CM | POA: Diagnosis not present

## 2017-10-29 DIAGNOSIS — K760 Fatty (change of) liver, not elsewhere classified: Secondary | ICD-10-CM | POA: Diagnosis not present

## 2017-10-29 MED ORDER — BISOPROLOL-HYDROCHLOROTHIAZIDE 10-6.25 MG PO TABS: 1.0000 | ORAL_TABLET | Freq: Every day | ORAL | 1 refills | Status: DC

## 2017-11-30 ENCOUNTER — Encounter: Payer: Self-pay | Admitting: Internal Medicine

## 2017-11-30 NOTE — Progress Notes (Signed)
[facility]   Unk Pinto, M.D.     [name], P.A.-C Liane Comber, [place]                [place] [address] Telephone [phone] Telefax [phone] Annual  Screening/Preventative Visit  & Comprehensive Evaluation & Examination     This very nice 84 y.o. WWM presents for a Screening /Preventative Visit & comprehensive evaluation and management of multiple medical co-morbidities.  Patient has been followed for HTN, HLD, T2_NIDDM  Prediabetes and Vitamin D Deficiency. Patient is followed on finasteride for Gleason 6 Prostate Ca by Dr Junious Silk since 2014.     HTN predates circa 2003 Patient's BP has been controlled at home.  Today's BP was initially sl elevated and rechecked at goal - 136/82. Patient denies any cardiac symptoms as chest pain, palpitations, shortness of breath, dizziness or ankle swelling.     Patient's hyperlipidemia is not controlled with diet. Last lipids were not at goal: Lab Results  Component Value Date   CHOL 179 04/30/2017   HDL 36 (L) 04/30/2017   LDLCALC 113 (H) 04/30/2017   TRIG 188 (H) 04/30/2017   CHOLHDL 5.0 (H) 04/30/2017      Patient has hx/o prediabetes  (A1c 5.8%/2008)  and patient denies reactive hypoglycemic symptoms, visual blurring, diabetic polys or paresthesias. Last A1c was Normal & at goal: Lab Results  Component Value Date   HGBA1C 5.5 04/30/2017       Finally, patient has history of Vitamin D Deficiency  ("45/2008) and last vitamin D was at goal: Lab Results  Component Value Date   VD25OH 72 04/30/2017   Current Outpatient Medications on File Prior to Visit  Medication Sig  . acetaminophen (TYLENOL) 325 MG tablet Take 650 mg by mouth every 6 (six) hours as needed (prn).  Marland Kitchen BABY ASPIRIN PO Take 81 mg by mouth daily.  . bisoprolol-hydrochlorothiazide (ZIAC) 10-6.25 MG tablet Take 1 tablet by mouth daily.  .  Cholecalciferol (VITAMIN D PO) Take 2,000 Int'l Units by mouth 2 (two) times daily.   . finasteride (PROSCAR) 5 MG tablet TAKE 1 TABLET DAILY FOR    PROSTATE  . Flaxseed, Linseed, (FLAX SEED OIL PO) Take 1,200 mg by mouth 3 (three) times daily.   . Magnesium 250 MG TABS Take 250 mg by mouth daily.  . Omega-3 Fatty Acids (FISH OIL PO) Take 1,200 mg by mouth 2 (two) times daily.   Marland Kitchen OVER THE COUNTER MEDICATION Takes OTC ranitidine 150 mg twice a day.   No current facility-administered medications on file prior to visit.    Allergies  Allergen Reactions  . Pravastatin   . Red Yeast Rice [Cholestin]   . Sulfa Antibiotics   . Vibramycin [Doxycycline Calcium]   . Zetia [Ezetimibe]    Past Medical History:  Diagnosis Date  . Essential hypertension 02/15/2007  . Hyperlipidemia 07/13/2013  . Prediabetes 07/13/2013  . Vitamin D deficiency 07/13/2013   Health Maintenance  Topic Date Due  . INFLUENZA VACCINE  10/14/2017  . TETANUS/TDAP  05/16/2025  . PNA vac Low Risk Adult  Completed   Immunization History  Administered Date(s) Administered  . DT 05/17/2015  . Influenza, High Dose Seasonal PF 01/23/2014, 02/13/2015, 11/27/2015, 12/01/2016  . Pneumococcal Conjugate-13 10/12/2013  . Pneumococcal Polysaccharide-23 05/17/2015  . Zoster Recombinat (Shingrix) 08/13/2017, 10/14/2017  Last Colon -  03/2002 - Dr Deatra Ina - patient was unable to do 2014 recall while caring for wife w/severe Dementia. He desires to do Cologard now.   Past Surgical History:  Procedure Laterality Date  . TONSILECTOMY/ADENOIDECTOMY WITH MYRINGOTOMY    . TREATMENT FISTULA ANAL     Family History  Problem Relation Age of Onset  . Hyperlipidemia Brother   . Hypertension Brother    Social History   Socioeconomic History  . Marital status: Widowed    Spouse name: deceased  . Number of children: None  Occupational History  . 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 Smoking status: Never Smoker  .  Smokeless tobacco: Never Used  Substance and Sexual Activity  . Alcohol use: No  . Drug use: Not on file  . Sexual activity: No    ROS Constitutional: Denies fever, chills, weight loss/gain, headaches, insomnia,  night sweats or change in appetite. Does c/o fatigue. Eyes: Denies redness, blurred vision, diplopia, discharge, itchy or watery eyes.  ENT: Denies discharge, congestion, post nasal drip, epistaxis, sore throat, earache, hearing loss, dental pain, Tinnitus, Vertigo, Sinus pain or snoring.  Cardio: Denies chest pain, palpitations, irregular heartbeat, syncope, dyspnea, diaphoresis, orthopnea, PND, claudication or edema Respiratory: denies cough, dyspnea, DOE, pleurisy, hoarseness, laryngitis or wheezing.  Gastrointestinal: Denies dysphagia, heartburn, reflux, water brash, pain, cramps, nausea, vomiting, bloating, diarrhea, constipation, hematemesis, melena, hematochezia, jaundice or hemorrhoids Genitourinary: Denies dysuria, frequency, discharge, hematuria or flank pain. Has urgency, nocturia x 2-3 & occasional hesitancy. Musculoskeletal: Denies arthralgia, myalgia, stiffness, Jt. Swelling, pain, limp or strain/sprain. Denies Falls. Skin: Denies puritis, rash, hives, warts, acne, eczema or change in skin lesion Neuro: No weakness, tremor, incoordination, spasms, paresthesia or pain Psychiatric: Denies confusion, memory loss or sensory loss. Denies Depression. Endocrine: Denies change in weight, skin, hair change, nocturia, and paresthesia, diabetic polys, visual blurring or hyper / hypo glycemic episodes.  Heme/Lymph: No excessive bleeding, bruising or enlarged lymph nodes.  Physical Exam  BP 136/82   Pulse 76   Temp 97.6 F (36.4 C)   Resp 16   Ht 5' 6.25" (1.683 m)   Wt 146 lb 9.6 oz (66.5 kg)   BMI 23.48 kg/m   General Appearance: Well nourished and well groomed and in no apparent distress.  Eyes: PERRLA, EOMs, conjunctiva no swelling or erythema, normal fundi and  vessels. Sinuses: No frontal/maxillary tenderness ENT/Mouth: EACs patent / TMs  nl. Nares clear without erythema, swelling, mucoid exudates. Oral hygiene is good. No erythema, swelling, or exudate. Tongue normal, non-obstructing. Tonsils not swollen or erythematous. Hearing normal.  Neck: Supple, thyroid not palpable. No bruits, nodes or JVD. Respiratory: Respiratory effort normal.  BS equal and clear bilateral without rales, rhonci, wheezing or stridor. Cardio: Heart sounds are normal with regular rate and rhythm and no murmurs, rubs or gallops. Peripheral pulses are normal and equal bilaterally without edema. No aortic or femoral bruits. Chest: symmetric with normal excursions and percussion.  Abdomen: Soft, with Nl bowel sounds. Nontender, no guarding, rebound, hernias, masses, or organomegaly.  Lymphatics: Non tender without lymphadenopathy.  Genitourinary: DRE - deferred to urologist - Dr Junious Silk Musculoskeletal: Full ROM all peripheral extremities, joint stability, 5/5 strength, and normal gait. Skin: Warm and dry without rashes, lesions, cyanosis, clubbing or  ecchymosis.  Neuro: Cranial nerves intact, reflexes equal bilaterally. Normal muscle tone, no cerebellar symptoms. Sensation intact.  Pysch: Alert and oriented X 3 with normal affect, insight and judgment appropriate.   Assessment and  Plan  1. Annual Preventative/Screening Exam   2. Essential hypertension  - EKG 12-Lead - [place], RETROPERITNL ABD,  LTD - Urinalysis, Routine w reflex microscopic - Microalbumin / creatinine urine ratio - CBC with Differential/Platelet - COMPLETE METABOLIC PANEL WITH GFR - Magnesium - TSH  3. Hyperlipidemia, mixed  - EKG 12-Lead - [place], RETROPERITNL ABD,  LTD - Lipid panel - TSH  4. Abnormal glucose  - EKG 12-Lead - [place], RETROPERITNL ABD,  LTD - Hemoglobin A1c - Insulin, random  5. Vitamin D deficiency  - VITAMIN D 25 Hydroxyl  6. Prediabetes  - EKG 12-Lead - [place], RETROPERITNL  ABD,  LTD - Hemoglobin A1c - Insulin, random  7. Screening for colorectal cancer  - POC Hemoccult Bld/Stl   8. BPH with obstruction/lower urinary tract symptoms  - PSA  9. Elevated PSA  - PSA  10. Prostate cancer screening  - PSA  11. Screening for ischemic heart disease  - EKG 12-Lead  12. FHx: heart disease  - EKG 12-Lead - [place], RETROPERITNL ABD,  LTD  13. Screening for AAA (aortic abdominal aneurysm)  - [place], RETROPERITNL ABD,  LTD  14. Medication management  - Urinalysis, Routine w reflex microscopic - CBC with Differential/Platelet - COMPLETE METABOLIC PANEL WITH GFR - Magnesium - Lipid panel - TSH - Hemoglobin A1c - Insulin, random - VITAMIN D 25 Hydroxyl        Patient was counseled in prudent diet, weight control to achieve/maintain BMI less than 25, BP monitoring, regular exercise and medications as discussed.  Discussed med effects and SE's. Routine screening labs and tests as requested with regular follow-up as recommended. Over 40 minutes of exam, counseling, chart review and high complex critical decision making was performed

## 2017-11-30 NOTE — Patient Instructions (Signed)

## 2017-12-01 ENCOUNTER — Encounter: Payer: Self-pay | Admitting: Internal Medicine

## 2017-12-01 ENCOUNTER — Ambulatory Visit (INDEPENDENT_AMBULATORY_CARE_PROVIDER_SITE_OTHER): Payer: Medicare HMO | Admitting: Internal Medicine

## 2017-12-01 VITALS — BP 136/82 | HR 76 | Temp 97.6°F | Resp 16 | Ht 66.25 in | Wt 146.6 lb

## 2017-12-01 DIAGNOSIS — E782 Mixed hyperlipidemia: Secondary | ICD-10-CM | POA: Diagnosis not present

## 2017-12-01 DIAGNOSIS — Z1212 Encounter for screening for malignant neoplasm of rectum: Secondary | ICD-10-CM

## 2017-12-01 DIAGNOSIS — Z23 Encounter for immunization: Secondary | ICD-10-CM

## 2017-12-01 DIAGNOSIS — N138 Other obstructive and reflux uropathy: Secondary | ICD-10-CM

## 2017-12-01 DIAGNOSIS — Z79899 Other long term (current) drug therapy: Secondary | ICD-10-CM | POA: Diagnosis not present

## 2017-12-01 DIAGNOSIS — Z136 Encounter for screening for cardiovascular disorders: Secondary | ICD-10-CM | POA: Diagnosis not present

## 2017-12-01 DIAGNOSIS — E559 Vitamin D deficiency, unspecified: Secondary | ICD-10-CM | POA: Diagnosis not present

## 2017-12-01 DIAGNOSIS — Z0000 Encounter for general adult medical examination without abnormal findings: Secondary | ICD-10-CM

## 2017-12-01 DIAGNOSIS — R310 Gross hematuria: Secondary | ICD-10-CM | POA: Diagnosis not present

## 2017-12-01 DIAGNOSIS — R7309 Other abnormal glucose: Secondary | ICD-10-CM

## 2017-12-01 DIAGNOSIS — R972 Elevated prostate specific antigen [PSA]: Secondary | ICD-10-CM | POA: Diagnosis not present

## 2017-12-01 DIAGNOSIS — I10 Essential (primary) hypertension: Secondary | ICD-10-CM | POA: Diagnosis not present

## 2017-12-01 DIAGNOSIS — Z8249 Family history of ischemic heart disease and other diseases of the circulatory system: Secondary | ICD-10-CM

## 2017-12-01 DIAGNOSIS — Z1211 Encounter for screening for malignant neoplasm of colon: Secondary | ICD-10-CM

## 2017-12-01 DIAGNOSIS — R7303 Prediabetes: Secondary | ICD-10-CM

## 2017-12-01 DIAGNOSIS — Z0001 Encounter for general adult medical examination with abnormal findings: Secondary | ICD-10-CM

## 2017-12-01 DIAGNOSIS — N401 Enlarged prostate with lower urinary tract symptoms: Secondary | ICD-10-CM

## 2017-12-01 DIAGNOSIS — K219 Gastro-esophageal reflux disease without esophagitis: Secondary | ICD-10-CM

## 2017-12-01 DIAGNOSIS — Z125 Encounter for screening for malignant neoplasm of prostate: Secondary | ICD-10-CM | POA: Diagnosis not present

## 2017-12-01 MED ORDER — RANITIDINE HCL 300 MG PO TABS: ORAL_TABLET | ORAL | 1 refills | Status: DC

## 2017-12-02 ENCOUNTER — Encounter: Payer: Self-pay | Admitting: *Deleted

## 2017-12-02 LAB — LIPID PANEL
Cholesterol: 191 mg/dL (ref <200)
HDL: 34 mg/dL — ABNORMAL LOW (ref >40)
LDL Cholesterol (Calc): 126 mg/dL (calc) — ABNORMAL HIGH
Non-HDL Cholesterol (Calc): 157 mg/dL (calc) — ABNORMAL HIGH (ref <130)
Total CHOL/HDL Ratio: 5.6 (calc) — ABNORMAL HIGH (ref <5.0)
Triglycerides: 185 mg/dL — ABNORMAL HIGH (ref <150)

## 2017-12-02 LAB — CBC WITH DIFFERENTIAL/PLATELET
Basophils Absolute: 48 cells/uL (ref 0–200)
Basophils Relative: 0.9 %
Eosinophils Absolute: 11 cells/uL — ABNORMAL LOW (ref 15–500)
Eosinophils Relative: 0.2 %
HCT: 48.4 % (ref 38.5–50.0)
Hemoglobin: 16.6 g/dL (ref 13.2–17.1)
Lymphs Abs: 1092 cells/uL (ref 850–3900)
MCH: 30.9 pg (ref 27.0–33.0)
MCHC: 34.3 g/dL (ref 32.0–36.0)
MCV: 90.1 fL (ref 80.0–100.0)
MPV: 11.6 fL (ref 7.5–12.5)
Monocytes Relative: 9 %
Neutro Abs: 3673 cells/uL (ref 1500–7800)
Neutrophils Relative %: 69.3 %
Platelets: 178 10*3/uL (ref 140–400)
RBC: 5.37 10*6/uL (ref 4.20–5.80)
RDW: 12.3 % (ref 11.0–15.0)
Total Lymphocyte: 20.6 %
WBC mixed population: 477 cells/uL (ref 200–950)
WBC: 5.3 10*3/uL (ref 3.8–10.8)

## 2017-12-02 LAB — MAGNESIUM: Magnesium: 2.1 mg/dL (ref 1.5–2.5)

## 2017-12-02 LAB — COMPLETE METABOLIC PANEL WITH GFR
AG Ratio: 2 (calc) (ref 1.0–2.5)
ALT: 36 U/L (ref 9–46)
AST: 25 U/L (ref 10–35)
Albumin: 4.4 g/dL (ref 3.6–5.1)
Alkaline phosphatase (APISO): 57 U/L (ref 40–115)
BUN/Creatinine Ratio: 13 (calc) (ref 6–22)
BUN: 15 mg/dL (ref 7–25)
CO2: 25 mmol/L (ref 20–32)
Calcium: 10 mg/dL (ref 8.6–10.3)
Chloride: 107 mmol/L (ref 98–110)
Creat: 1.14 mg/dL — ABNORMAL HIGH (ref 0.70–1.11)
GFR, Est African American: 69 mL/min/{1.73_m2} (ref >=60)
GFR, Est Non African American: 60 mL/min/{1.73_m2} (ref >=60)
Globulin: 2.2 g/dL (calc) (ref 1.9–3.7)
Glucose, Bld: 103 mg/dL — ABNORMAL HIGH (ref 65–99)
Potassium: 4.4 mmol/L (ref 3.5–5.3)
Sodium: 142 mmol/L (ref 135–146)
Total Bilirubin: 1 mg/dL (ref 0.2–1.2)
Total Protein: 6.6 g/dL (ref 6.1–8.1)

## 2017-12-02 LAB — URINALYSIS, ROUTINE W REFLEX MICROSCOPIC
Bilirubin Urine: NEGATIVE
Glucose, UA: NEGATIVE
Hgb urine dipstick: NEGATIVE
Ketones, ur: NEGATIVE
Leukocytes, UA: NEGATIVE
Nitrite: NEGATIVE
Protein, ur: NEGATIVE
Specific Gravity, Urine: 1.01 (ref 1.001–1.03)
pH: 5.5 (ref 5.0–8.0)

## 2017-12-02 LAB — HEMOGLOBIN A1C
Hgb A1c MFr Bld: 5.5 % of total Hgb (ref <5.7)
Mean Plasma Glucose: 111 (calc)
eAG (mmol/L): 6.2 (calc)

## 2017-12-02 LAB — TSH: TSH: 0.83 mIU/L (ref 0.40–4.50)

## 2017-12-02 LAB — PSA: PSA: 7.3 ng/mL — ABNORMAL HIGH (ref <=4.0)

## 2017-12-02 LAB — MICROALBUMIN / CREATININE URINE RATIO
Creatinine, Urine: 35 mg/dL (ref 20–320)
Microalb, Ur: <0.2 mg/dL

## 2017-12-02 LAB — INSULIN, RANDOM: Insulin: 11.8 u[IU]/mL (ref 2.0–19.6)

## 2017-12-02 LAB — VITAMIN D 25 HYDROXY (VIT D DEFICIENCY, FRACTURES): Vit D, 25-Hydroxy: 80 ng/mL (ref 30–100)

## 2017-12-08 DIAGNOSIS — Z1211 Encounter for screening for malignant neoplasm of colon: Secondary | ICD-10-CM | POA: Diagnosis not present

## 2017-12-08 DIAGNOSIS — Z1212 Encounter for screening for malignant neoplasm of rectum: Secondary | ICD-10-CM | POA: Diagnosis not present

## 2017-12-12 LAB — COLOGUARD: Cologuard: NEGATIVE

## 2017-12-14 ENCOUNTER — Telehealth: Payer: Self-pay | Admitting: *Deleted

## 2017-12-14 ENCOUNTER — Encounter: Payer: Self-pay | Admitting: *Deleted

## 2017-12-14 NOTE — Telephone Encounter (Signed)
A message was left to inform the patient his Cologuard was negative.

## 2018-02-02 DIAGNOSIS — R69 Illness, unspecified: Secondary | ICD-10-CM | POA: Diagnosis not present

## 2018-03-04 ENCOUNTER — Encounter: Payer: Self-pay | Admitting: Adult Health

## 2018-03-04 NOTE — Progress Notes (Signed)
FOLLOW UP  Assessment and Plan:   Hypertension Well controlled with current medications  Monitor blood pressure at home; patient to call if consistently greater than 130/80 Continue DASH diet.   Reminder to go to the ER if any CP, SOB, nausea, dizziness, severe HA, changes vision/speech, left arm numbness and tingling and jaw pain.  Cholesterol Currently at goal by lifestyle - Continue monitoring -  Continue low cholesterol diet and exercise.  Check lipid panel.   Other abnormal glucose Recent A1Cs at goal Discussed diet/exercise, weight management  Defer A1C; check CMP  Normal weight for adult - Long discussion about diet, and exercise Recommended diet heavy in fruits and veggies and low in animal meats, cheeses, and dairy products, appropriate calorie intake Will follow up in 3 months  Vitamin D Def At goal at last visit; continue supplementation to maintain goal of 70-100 Defer Vit D level  BPH Currently asymptomatic on finasteride Continue follow up with urology for watchful monitoring  Cough/GERD Improved on allegra and ranitidine Discussed diet, avoiding triggers and other lifestyle changes  Continue diet and meds as discussed. Further disposition pending results of labs. Discussed med's effects and SE's.   Over 30 minutes of exam, counseling, chart review, and critical decision making was performed.   Future Appointments  Date Time Provider [place]  06/16/2018 10:30 AM Unk Pinto, MD [facility] None  08/19/2018 10:30 AM Liane Comber, NP [facility] None  12/28/2018 10:00 AM Unk Pinto, MD [facility] None    ----------------------------------------------------------------------------------------------------------------------  HPI 84 y.o. male  presents for 6 month follow up on hypertension, cholesterol, glucose management, weight and vitamin D deficiency.   he has a diagnosis of GERD which is currently managed by ranitidine 300 mg daily.   he reports symptoms is currently well controlled, and denies breakthrough reflux, burning in chest, hoarseness or cough.    BMI is Body mass index is 23.52 kg/m., he has been working on diet and exercise, he is walking daily, does AM stretches.  Wt Readings from Last 3 Encounters:  03/07/18 146 lb 12.8 oz (66.6 kg)  12/01/17 146 lb 9.6 oz (66.5 kg)  10/04/17 147 lb 3.2 oz (66.8 kg)   His blood pressure has been controlled at home, presents with log demonstrating 110s-130s/60-70s, he has calculated average which is 123/71, today their BP is BP: 136/82  He does workout. He denies chest pain, shortness of breath, dizziness.   He is not on cholesterol medication other than omega 3. His cholesterol is not at goal. The cholesterol last visit was:   Lab Results  Component Value Date   CHOL 191 12/01/2017   HDL 34 (L) 12/01/2017   LDLCALC 126 (H) 12/01/2017   TRIG 185 (H) 12/01/2017   CHOLHDL 5.6 (H) 12/01/2017    He has been working on diet and exercise for glucose management, and denies increased appetite, nausea, paresthesia of the feet, polydipsia, polyuria, visual disturbances and vomiting. Last A1C in the office was:  Lab Results  Component Value Date   HGBA1C 5.5 12/01/2017   Patient is on Vitamin D supplement and at goal:    Lab Results  Component Value Date   VD25OH 80 12/01/2017        Current Medications:  Current Outpatient Medications on File Prior to Visit  Medication Sig  . acetaminophen (TYLENOL) 325 MG tablet Take 650 mg by mouth every 6 (six) hours as needed (prn).  Marland Kitchen BABY ASPIRIN PO Take 81 mg by mouth daily.  . bisoprolol-hydrochlorothiazide [facility])  10-6.25 MG tablet Take 1 tablet by mouth daily.  . Cholecalciferol (VITAMIN D PO) Take 2,000 Int'l Units by mouth 2 (two) times daily.   . finasteride (PROSCAR) 5 MG tablet TAKE 1 TABLET DAILY FOR    PROSTATE  . Flaxseed, Linseed, (FLAX SEED OIL PO) Take 1,200 mg by mouth 3 (three) times daily.   . Magnesium 250 MG  TABS Take 250 mg by mouth daily.  . Omega-3 Fatty Acids (FISH OIL PO) Take 1,200 mg by mouth 2 (two) times daily.   . ranitidine (ZANTAC) 300 MG tablet Take 1 to 2 tablets daily for heartburn & reflux   No current facility-administered medications on file prior to visit.      Allergies:  Allergies  Allergen Reactions  . Pravastatin   . Red Yeast Rice [Cholestin]   . Sulfa Antibiotics   . Vibramycin [Doxycycline Calcium]   . Zetia [Ezetimibe]      Medical History:  Past Medical History:  Diagnosis Date  . Essential hypertension 02/15/2007  . Hyperlipidemia 07/13/2013  . Prediabetes 07/13/2013  . Pulmonary nodule (2008 resolved on f/u)  02/15/2007  . Vitamin D deficiency 07/13/2013   Family history- Reviewed and unchanged Social history- Reviewed and unchanged   Review of Systems:  Review of Systems  Constitutional: Negative for malaise/fatigue and weight loss.  HENT: Negative for hearing loss and tinnitus.   Eyes: Negative for blurred vision and double vision.  Respiratory: Positive for cough (Dry, mild, intermittent). Negative for shortness of breath and wheezing.   Cardiovascular: Negative for chest pain, palpitations, orthopnea, claudication and leg swelling.  Gastrointestinal: Negative for abdominal pain, blood in stool, constipation, diarrhea, heartburn, melena, nausea and vomiting.  Genitourinary: Negative.   Musculoskeletal: Negative for joint pain and myalgias.  Skin: Negative for rash.  Neurological: Negative for dizziness, tingling, sensory change, weakness and headaches.  Endo/Heme/Allergies: Negative for polydipsia.  Psychiatric/Behavioral: Negative.   All other systems reviewed and are negative.   Physical Exam: BP 136/82   Pulse 64   Temp (!) 97.5 F (36.4 C)   Resp 16   Ht 5' 6.25" (1.683 m)   Wt 146 lb 12.8 oz (66.6 kg)   BMI 23.52 kg/m  Wt Readings from Last 3 Encounters:  03/07/18 146 lb 12.8 oz (66.6 kg)  12/01/17 146 lb 9.6 oz (66.5 kg)   10/04/17 147 lb 3.2 oz (66.8 kg)   General Appearance: Well nourished, in no apparent distress. Eyes: PERRLA, EOMs, conjunctiva no swelling or erythema Sinuses: No Frontal/maxillary tenderness ENT/Mouth: Ext aud canals clear, TMs without erythema, bulging. No erythema, swelling, or exudate on post pharynx.  Tonsils not swollen or erythematous. Hearing normal.  Neck: Supple, thyroid normal.  Respiratory: Respiratory effort normal, BS equal bilaterally without rales, rhonchi, wheezing or stridor.  Cardio: RRR with no MRGs. Brisk peripheral pulses without edema.  Abdomen: Soft, + BS.  Non tender, no guarding, rebound, hernias, masses. Lymphatics: Non tender without lymphadenopathy.  Musculoskeletal: Full ROM, 5/5 strength, Normal gait Skin: Warm, dry without rashes, lesions, ecchymosis.  Neuro: Cranial nerves intact. No cerebellar symptoms.  Psych: Awake and oriented X 3, normal affect, Insight and Judgment appropriate.    Izora Ribas, NP 11:24 AM [facility]

## 2018-03-07 ENCOUNTER — Ambulatory Visit (INDEPENDENT_AMBULATORY_CARE_PROVIDER_SITE_OTHER): Payer: Medicare HMO | Admitting: Adult Health

## 2018-03-07 ENCOUNTER — Encounter: Payer: Self-pay | Admitting: Adult Health

## 2018-03-07 VITALS — BP 136/82 | HR 64 | Temp 97.5°F | Resp 16 | Ht 66.25 in | Wt 146.8 lb

## 2018-03-07 DIAGNOSIS — Z6823 Body mass index (BMI) 23.0-23.9, adult: Secondary | ICD-10-CM

## 2018-03-07 DIAGNOSIS — E559 Vitamin D deficiency, unspecified: Secondary | ICD-10-CM | POA: Diagnosis not present

## 2018-03-07 DIAGNOSIS — Z79899 Other long term (current) drug therapy: Secondary | ICD-10-CM | POA: Diagnosis not present

## 2018-03-07 DIAGNOSIS — R7309 Other abnormal glucose: Secondary | ICD-10-CM | POA: Diagnosis not present

## 2018-03-07 DIAGNOSIS — I10 Essential (primary) hypertension: Secondary | ICD-10-CM

## 2018-03-07 DIAGNOSIS — E782 Mixed hyperlipidemia: Secondary | ICD-10-CM | POA: Diagnosis not present

## 2018-03-07 NOTE — Patient Instructions (Signed)
Goals    . Blood Pressure < 130/80    . Exercise 150 min/wk Moderate Activity    . LDL CALC < 100      Know what a healthy weight is for you (roughly BMI <25) and aim to maintain this  Aim for 7+ servings of fruits and vegetables daily  65-80+ fluid ounces of water or unsweet tea for healthy kidneys  Limit to max 1 drink of alcohol per day; avoid smoking/tobacco  Limit animal fats in diet for cholesterol and heart health - choose grass fed whenever available  Avoid highly processed foods, and foods high in saturated/trans fats  Aim for low stress - take time to unwind and care for your mental health  Aim for 150 min of moderate intensity exercise weekly for heart health, and weights twice weekly for bone health  Aim for 7-9 hours of sleep daily       When it comes to diets, agreement about the perfect plan isn't easy to find, even among the experts. Experts at the [employer] developed an idea known as the Healthy Eating Plate. Just imagine a plate divided into logical, healthy portions.  The emphasis is on diet quality:  Load up on vegetables and fruits - one-half of your plate: Aim for color and variety, and remember that potatoes don't count.  Go for whole grains - one-quarter of your plate: Whole wheat, barley, wheat berries, quinoa, oats, brown rice, and foods made with them. If you want pasta, go with whole wheat pasta.  Protein power - one-quarter of your plate: Fish, chicken, beans, and nuts are all healthy, versatile protein sources. Limit red meat.  The diet, however, does go beyond the plate, offering a few other suggestions.  Use healthy plant oils, such as olive, canola, soy, corn, sunflower and peanut. Check the labels, and avoid partially hydrogenated oil, which have unhealthy trans fats.  If you're thirsty, drink water. Coffee and tea are good in moderation, but skip sugary drinks and limit milk and dairy products to one or two daily  servings.  The type of carbohydrate in the diet is more important than the amount. Some sources of carbohydrates, such as vegetables, fruits, whole grains, and beans-are healthier than others.  Finally, stay active.

## 2018-03-08 LAB — CBC WITH DIFFERENTIAL/PLATELET
Absolute Monocytes: 632 cells/uL (ref 200–950)
Basophils Absolute: 48 cells/uL (ref 0–200)
Basophils Relative: 0.7 %
Eosinophils Absolute: 41 cells/uL (ref 15–500)
Eosinophils Relative: 0.6 %
HCT: 48.7 % (ref 38.5–50.0)
Hemoglobin: 17.1 g/dL (ref 13.2–17.1)
Lymphs Abs: 1367 cells/uL (ref 850–3900)
MCH: 31.7 pg (ref 27.0–33.0)
MCHC: 35.1 g/dL (ref 32.0–36.0)
MCV: 90.4 fL (ref 80.0–100.0)
MPV: 11.6 fL (ref 7.5–12.5)
Monocytes Relative: 9.3 %
Neutro Abs: 4712 cells/uL (ref 1500–7800)
Neutrophils Relative %: 69.3 %
Platelets: 201 10*3/uL (ref 140–400)
RBC: 5.39 10*6/uL (ref 4.20–5.80)
RDW: 12.5 % (ref 11.0–15.0)
Total Lymphocyte: 20.1 %
WBC: 6.8 10*3/uL (ref 3.8–10.8)

## 2018-03-08 LAB — TSH: TSH: 1.07 mIU/L (ref 0.40–4.50)

## 2018-03-08 LAB — LIPID PANEL
Cholesterol: 185 mg/dL (ref <200)
HDL: 38 mg/dL — ABNORMAL LOW (ref >40)
LDL Cholesterol (Calc): 113 mg/dL (calc) — ABNORMAL HIGH
Non-HDL Cholesterol (Calc): 147 mg/dL (calc) — ABNORMAL HIGH (ref <130)
Total CHOL/HDL Ratio: 4.9 (calc) (ref <5.0)
Triglycerides: 222 mg/dL — ABNORMAL HIGH (ref <150)

## 2018-03-08 LAB — COMPLETE METABOLIC PANEL WITH GFR
AG Ratio: 1.8 (calc) (ref 1.0–2.5)
ALT: 32 U/L (ref 9–46)
AST: 26 U/L (ref 10–35)
Albumin: 4.4 g/dL (ref 3.6–5.1)
Alkaline phosphatase (APISO): 63 U/L (ref 40–115)
BUN: 15 mg/dL (ref 7–25)
CO2: 28 mmol/L (ref 20–32)
Calcium: 10.2 mg/dL (ref 8.6–10.3)
Chloride: 104 mmol/L (ref 98–110)
Creat: 1.06 mg/dL (ref 0.70–1.11)
GFR, Est African American: 75 mL/min/{1.73_m2} (ref >=60)
GFR, Est Non African American: 65 mL/min/{1.73_m2} (ref >=60)
Globulin: 2.5 g/dL (calc) (ref 1.9–3.7)
Glucose, Bld: 79 mg/dL (ref 65–99)
Potassium: 5.3 mmol/L (ref 3.5–5.3)
Sodium: 143 mmol/L (ref 135–146)
Total Bilirubin: 0.6 mg/dL (ref 0.2–1.2)
Total Protein: 6.9 g/dL (ref 6.1–8.1)

## 2018-03-16 HISTORY — PX: PROSTATE BIOPSY: SHX241

## 2018-03-21 DIAGNOSIS — N400 Enlarged prostate without lower urinary tract symptoms: Secondary | ICD-10-CM | POA: Diagnosis not present

## 2018-03-21 DIAGNOSIS — C61 Malignant neoplasm of prostate: Secondary | ICD-10-CM | POA: Diagnosis not present

## 2018-03-23 DIAGNOSIS — H524 Presbyopia: Secondary | ICD-10-CM | POA: Diagnosis not present

## 2018-03-23 DIAGNOSIS — Z961 Presence of intraocular lens: Secondary | ICD-10-CM | POA: Diagnosis not present

## 2018-03-23 DIAGNOSIS — H5211 Myopia, right eye: Secondary | ICD-10-CM | POA: Diagnosis not present

## 2018-04-21 ENCOUNTER — Other Ambulatory Visit: Payer: Self-pay | Admitting: Physician Assistant

## 2018-04-21 ENCOUNTER — Other Ambulatory Visit: Payer: Self-pay | Admitting: Internal Medicine

## 2018-04-21 DIAGNOSIS — N401 Enlarged prostate with lower urinary tract symptoms: Secondary | ICD-10-CM

## 2018-06-15 ENCOUNTER — Encounter: Payer: Self-pay | Admitting: Internal Medicine

## 2018-06-15 NOTE — Patient Instructions (Signed)

## 2018-06-15 NOTE — Progress Notes (Signed)
THIS ENCOUNTER IS A VIRTUAL VISIT DUE TO COVID-19 - PATIENT WAS NOT SEEN IN THE OFFICE.  PATIENT HAS CONSENTED TO VIRTUAL VISIT / TELEMEDICINE VISIT  Virtual Visit via telephone Note  I connected with patient  on 06/16/18  by telephone.  I verified that I am speaking with the correct person using two identifiers.        I discussed the limitations of evaluation and management by telemedicine and the availability of in person appointments. The patient expressed understanding and agreed to proceed.  History of Present Illness:      This very nice 85 y.o. WWM presents for 6 month follow up with HTN, HLD, Pre-Diabetes and Vitamin D Deficiency.  Patient has hx/o  Gleason 6 Prostate Ca and has been followed conservatively on Proscar by Dr Junious Silk since 2014.       Patient is treated for HTN (2003)  & BP has been controlled at home in the range 130's/70's. Today's BP is at goal - 138/75. Patient has had no complaints of any cardiac type chest pain, palpitations, dyspnea / orthopnea / PND, dizziness, claudication, or dependent edema.      Hyperlipidemia is not controlled with diet.  Last Lipids were not at goal: Lab Results  Component Value Date   CHOL 185 03/07/2018   HDL 38 (L) 03/07/2018   LDLCALC 113 (H) 03/07/2018   TRIG 222 (H) 03/07/2018   CHOLHDL 4.9 03/07/2018       Also, the patient has history of PreDiabetes (A1c 5.8% / 2008)  and has had no symptoms of reactive hypoglycemia, diabetic polys, paresthesias or visual blurring.  Last A1c was Normal & at goal: Lab Results  Component Value Date   HGBA1C 5.5 12/01/2017      Further, the patient also has history of Vitamin D Deficiency ("45 / 2008)  and supplements vitamin D without any suspected side-effects. Last vitamin D was at goal:  Lab Results  Component Value Date   VD25OH 80 12/01/2017   Current Outpatient Medications on File Prior to Visit  Medication Sig  . acetaminophen (TYLENOL) 325 MG tablet Take 650 mg by mouth  every 6 (six) hours as needed (prn).  Marland Kitchen BABY ASPIRIN PO Take 81 mg by mouth daily.  . bisoprolol-hydrochlorothiazide (ZIAC) 10-6.25 MG tablet TAKE 1 TABLET DAILY  . Cholecalciferol (VITAMIN D PO) Take 2,000 Int'l Units by mouth 2 (two) times daily.   . finasteride (PROSCAR) 5 MG tablet TAKE 1 TABLET DAILY FOR    PROSTATE  . Flaxseed, Linseed, (FLAX SEED OIL PO) Take 1,200 mg by mouth 3 (three) times daily.   . Magnesium 250 MG TABS Take 250 mg by mouth daily.  . Omega-3 Fatty Acids (FISH OIL PO) Take 1,200 mg by mouth 2 (two) times daily.   . ranitidine (ZANTAC) 300 MG tablet Take 1 to 2 tablets daily for heartburn & reflux   No current facility-administered medications on file prior to visit.    Allergies  Allergen Reactions  . Pravastatin   . Red Yeast Rice [Cholestin]   . Sulfa Antibiotics   . Vibramycin [Doxycycline Calcium]   . Zetia [Ezetimibe]    PMHx:   Past Medical History:  Diagnosis Date  . Essential hypertension 02/15/2007  . Hyperlipidemia 07/13/2013  . Prediabetes 07/13/2013  . Pulmonary nodule (2008 resolved on f/u)  02/15/2007  . Vitamin D deficiency 07/13/2013   Immunization History  Administered Date(s) Administered  . DT 05/17/2015  . Influenza, High Dose Seasonal PF  01/23/2014, 02/13/2015, 11/27/2015, 12/01/2016, 12/01/2017  . Pneumococcal Conjugate-13 10/12/2013  . Pneumococcal Polysaccharide-23 05/17/2015  . Zoster Recombinat (Shingrix) 08/13/2017, 10/14/2017   Past Surgical History:  Procedure Laterality Date  . TONSILECTOMY/ADENOIDECTOMY WITH MYRINGOTOMY    . TREATMENT FISTULA ANAL     FHx:    Reviewed / unchanged  SHx:    Reviewed / unchanged   Systems Review:  Constitutional: Denies fever, chills, wt changes, headaches, insomnia, fatigue, night sweats, change in appetite. Eyes: Denies redness, blurred vision, diplopia, discharge, itchy, watery eyes.  ENT: Denies discharge, congestion, post nasal drip, epistaxis, sore throat, earache, hearing  loss, dental pain, tinnitus, vertigo, sinus pain, snoring.  CV: Denies chest pain, palpitations, irregular heartbeat, syncope, dyspnea, diaphoresis, orthopnea, PND, claudication or edema. Respiratory: denies cough, dyspnea, DOE, pleurisy, hoarseness, laryngitis, wheezing.  Gastrointestinal: Denies dysphagia, odynophagia, heartburn, reflux, water brash, abdominal pain or cramps, nausea, vomiting, bloating, diarrhea, constipation, hematemesis, melena, hematochezia  or hemorrhoids. Genitourinary: Denies dysuria, frequency,  discharge, hematuria or flank pain. Has urgency, nocturia x 2-3 & occasional hesitancy. Musculoskeletal: Denies arthralgias, myalgias, stiffness, jt. swelling, pain, limping or strain/sprain.  Skin: Denies pruritus, rash, hives, warts, acne, eczema or change in skin lesion(s). Neuro: No weakness, tremor, incoordination, spasms, paresthesia or pain. Psychiatric: Denies confusion, memory loss or sensory loss. Endo: Denies change in weight, skin or hair change.  Heme/Lymph: No excessive bleeding, bruising or enlarged lymph nodes.  Physical Exam  BP 138/75   Temp 98.6 F (37 C)   Wt 140 lb (63.5 kg)   BMI 22.43 kg/m   General : Well sounding patient in no apparent distress HEENT: no hoarseness, no cough for duration of visit Lungs: speaks in complete sentences, no audible wheezing, no apparent distress Neurological: alert, oriented x 3 Psychiatric: pleasant, judgement appropriate   Assessment and Plan:  1. Essential hypertension  - Continue medication, monitor blood pressure at home.  - Continue DASH diet.  Reminder to go to the ER if any CP,  SOB, nausea, dizziness, severe HA, changes vision/speech.  2. Hyperlipidemia, mixed  - Continue diet/meds, exercise,& lifestyle modifications.  - Continue monitor periodic cholesterol/liver & renal functions   3. Abnormal glucose  - Continue diet, exercise  - Lifestyle modifications.  - Monitor appropriate labs.  4.  Vitamin D deficiency  - continue supplementation  5. Prediabetes  - Continue diet, exercise  - Lifestyle modifications.  - Monitor appropriate labs.  6. BPH with obstruction/lower urinary tract symptoms  7. Medication management & Review       Discussed  regular exercise, BP monitoring, weight control to achieve/maintain BMI less than 25 and discussed med and SE's. Recommended labs to assess and monitor clinical status with further disposition pending results of labs. Over 30 minutes of exam, counseling, chart review was performed.       I discussed the assessment and treatment plan with the patient. The patient was provided an opportunity to ask questions and all were answered. The patient agreed with the plan and demonstrated an understanding of the instructions.       The patient was advised to call back or seek an in-person evaluation if the symptoms worsen or if the condition fails to improve as anticipated.       I provided  28 minutes of non-face-to-face time during this encounter and over 40 minutes of virtual exam, counseling, chart review and  complex critical decision making was performed  Kirtland Bouchard, MD

## 2018-06-16 ENCOUNTER — Ambulatory Visit: Payer: Medicare HMO | Admitting: Internal Medicine

## 2018-06-16 ENCOUNTER — Other Ambulatory Visit: Payer: Self-pay

## 2018-06-16 VITALS — BP 138/75 | Temp 98.6°F | Wt 140.0 lb

## 2018-06-16 DIAGNOSIS — I10 Essential (primary) hypertension: Secondary | ICD-10-CM

## 2018-06-16 DIAGNOSIS — E559 Vitamin D deficiency, unspecified: Secondary | ICD-10-CM

## 2018-06-16 DIAGNOSIS — Z79899 Other long term (current) drug therapy: Secondary | ICD-10-CM

## 2018-06-16 DIAGNOSIS — R7303 Prediabetes: Secondary | ICD-10-CM | POA: Diagnosis not present

## 2018-06-16 DIAGNOSIS — E782 Mixed hyperlipidemia: Secondary | ICD-10-CM | POA: Diagnosis not present

## 2018-06-16 DIAGNOSIS — R7309 Other abnormal glucose: Secondary | ICD-10-CM

## 2018-06-16 DIAGNOSIS — N401 Enlarged prostate with lower urinary tract symptoms: Secondary | ICD-10-CM

## 2018-06-16 DIAGNOSIS — N138 Other obstructive and reflux uropathy: Secondary | ICD-10-CM | POA: Diagnosis not present

## 2018-08-10 DIAGNOSIS — R69 Illness, unspecified: Secondary | ICD-10-CM | POA: Diagnosis not present

## 2018-08-16 ENCOUNTER — Other Ambulatory Visit: Payer: Self-pay | Admitting: Internal Medicine

## 2018-08-16 DIAGNOSIS — K219 Gastro-esophageal reflux disease without esophagitis: Secondary | ICD-10-CM

## 2018-08-16 MED ORDER — FAMOTIDINE 20 MG PO TABS: ORAL_TABLET | ORAL | 3 refills | Status: DC

## 2018-08-19 ENCOUNTER — Ambulatory Visit: Payer: Self-pay | Admitting: Adult Health

## 2018-09-18 NOTE — Progress Notes (Signed)
MEDICARE ANNUAL WELLNESS VISIT AND FOLLOW UP Assessment:   Diagnoses and all orders for this visit:  Encounter for Medicare annual wellness exam  Essential hypertension Continue medication Monitor blood pressure at home; call if consistently over 130/80 Continue DASH diet.   Reminder to go to the ER if any CP, SOB, nausea, dizziness, severe HA, changes vision/speech, left arm numbness and tingling and jaw pain.  Pulmonary sarcoidosis (1988)  Followed by pulmonology  Prostate cancer [facility])  Low risk cancer, gleason 6, on finasteride and followed under active surveillance by Dr. Junious Silk Continue to monitor LUTS and PSAs  Vitamin D deficiency At goal at last check; continue to recommend supplementation for goal of 60-100 Defer vitamin D level to CPE  Pulmonary nodule (2008 resolved on f/u)  Resolved  Other abnormal glucose Recent A1Cs at goal Discussed diet/exercise, weight management  Defer A1C; check BMP  Medication management CBC, CMP/GFR  Hyperlipidemia No longer treated due to age Mild elevations monitored Continue cholesterol diet/exercise Check lipid panel   BMI 23.0-23.9, adult Continue to recommend diet heavy in fruits and veggies and low in animal meats, cheeses, and dairy products, appropriate calorie intake Discuss exercise recommendations routinely Continue to monitor weight at each visit  Left plantar fascitis Conservative treatment, night time orthotics, arch support, RICE, NSAID, stretches given If not better will do injection of dexamethasone   Over 30 minutes of exam, counseling, chart review, and critical decision making was performed  Future Appointments  Date Time Provider [place]  12/28/2018 10:00 AM Unk Pinto, MD [facility] None      Plan:   During the course of the visit the patient was educated and counseled about appropriate screening and preventive services including:    Pneumococcal vaccine   Influenza  vaccine  Prevnar 13  Td vaccine  Screening electrocardiogram  Colorectal cancer screening  Diabetes screening  Glaucoma screening  Nutrition counseling    Subjective:  Matthew Aguirre is a 85 y.o. male who presents for Medicare Annual Wellness Visit and 3 month follow up for HTN, hyperlipidemia, glucose management, and vitamin D Def.   He reports mild left anterior heel pain for several weeks; reports typically just a few minutes in AM; has been doing plantar fascitis stretches and improved.   Patient has hx/o Gleason 6 Prostate Cancer (2014 ) followed on Finasteride by active surveillance per Dr Junious Silk. Has follow up scheduled next week.   BMI is Body mass index is 23.28 kg/m., he has been working on diet and exercise. Wt Readings from Last 3 Encounters:  09/21/18 146 lb 6.4 oz (66.4 kg)  06/16/18 140 lb (63.5 kg)  03/07/18 146 lb 12.8 oz (66.6 kg)   His blood pressure has been controlled at home (120/60), today their BP is BP: 128/76 He does workout. He denies chest pain, shortness of breath, dizziness.   He is not on cholesterol medication secondary to age. His cholesterol is not at goal. The cholesterol last visit was:   Lab Results  Component Value Date   CHOL 185 03/07/2018   HDL 38 (L) 03/07/2018   LDLCALC 113 (H) 03/07/2018   TRIG 222 (H) 03/07/2018   CHOLHDL 4.9 03/07/2018   He has been working on diet and exercise for glucose management, and denies foot ulcerations, increased appetite, nausea, paresthesia of the feet, polydipsia, polyuria, visual disturbances, vomiting and weight loss. Last A1C in the office was:  Lab Results  Component Value Date   HGBA1C 5.5 12/01/2017   Last GFR  Lab Results  Component Value Date   GFRNONAA 65 03/07/2018    Patient is on Vitamin D supplement and at goal:    Lab Results  Component Value Date   VD25OH 80 12/01/2017     Lab Results  Component Value Date   PSA 7.3 (H) 12/01/2017   PSA 8.1 (H) 10/28/2016   PSA  7.84 (H) 09/30/2015    Medication Review:   Current Outpatient Medications (Cardiovascular):  .  bisoprolol-hydrochlorothiazide (ZIAC) 10-6.25 MG tablet, TAKE 1 TABLET DAILY   Current Outpatient Medications (Analgesics):  .  acetaminophen (TYLENOL) 325 MG tablet, Take 650 mg by mouth every 6 (six) hours as needed (prn). Marland Kitchen  BABY ASPIRIN PO, Take 81 mg by mouth daily.   Current Outpatient Medications (Other):  Marland Kitchen  Cholecalciferol (VITAMIN D PO), Take 2,000 Int'l Units by mouth 2 (two) times daily.  .  finasteride (PROSCAR) 5 MG tablet, TAKE 1 TABLET DAILY FOR    PROSTATE .  Flaxseed, Linseed, (FLAX SEED OIL PO), Take 1,200 mg by mouth 3 (three) times daily.  .  Magnesium 250 MG TABS, Take 250 mg by mouth daily. .  Omega-3 Fatty Acids (FISH OIL PO), Take 1,200 mg by mouth 2 (two) times daily.  .  famotidine (PEPCID) 20 MG tablet, Take 1 tablet 2 x /day with meals for Acid Indigestion  Allergies: Allergies  Allergen Reactions  . Pravastatin   . Red Yeast Rice [Cholestin]   . Sulfa Antibiotics   . Vibramycin [Doxycycline Calcium]   . Zetia [Ezetimibe]     Current Problems (verified) has Essential hypertension; Hyperlipidemia; Vitamin D deficiency; Medication management; Prostate cancer ([place]) on active surveillance; Pulmonary sarcoidosis (1988) ; Other abnormal glucose; BMI 23.0-23.9, adult; Plantar fasciitis of left foot; Gastroesophageal reflux disease without esophagitis; and Elevated PSA on their problem list.  Screening Tests Immunization History  Administered Date(s) Administered  . DT 05/17/2015  . Influenza, High Dose Seasonal PF 01/23/2014, 02/13/2015, 11/27/2015, 12/01/2016, 12/01/2017  . Pneumococcal Conjugate-13 10/12/2013  . Pneumococcal Polysaccharide-23 05/17/2015  . Zoster Recombinat (Shingrix) 08/13/2017, 10/14/2017   Preventative care: Last colonoscopy: 2014, declines further screenings Cologuard: 11/2017, neg  Prior vaccinations: TD or Tdap:  2017  Influenza: 2019  Pneumococcal:2017 Prevnar13: 2015 Shingles/Zostavax: 2019  Names of Other Physician/Practitioners you currently use: 1. [facility] here for primary care 2. Dr. Gershon Crane, eye doctor, last visit 03/2018, glasses  3. Dr. Orvil Feil, dentist, last visit 08/2018  Patient Care Team: Unk Pinto, MD as PCP - General (Internal Medicine) Festus Aloe, MD as Consulting Physician (Urology) Inda Castle, MD (Inactive) as Consulting Physician (Gastroenterology) Rutherford Guys, MD as Consulting Physician (Ophthalmology)  Surgical: He  has a past surgical history that includes Treatment fistula anal and Tonsilectomy/adenoidectomy with myringotomy. Family His family history includes Hyperlipidemia in his brother; Hypertension in his brother. Social history  He reports that he has never smoked. He has never used smokeless tobacco. He reports that he does not drink alcohol. No history on file for drug.  MEDICARE WELLNESS OBJECTIVES: Physical activity: Current Exercise Habits: Home exercise routine, Type of exercise: walking;stretching, Time (Minutes): 30, Frequency (Times/Week): 7, Weekly Exercise (Minutes/Week): 210, Intensity: Mild, Exercise limited by: None identified Cardiac risk factors: Cardiac Risk Factors include: advanced age (>68men, >47 women);male gender;hypertension;dyslipidemia Depression/mood screen:   Depression screen [facility] 2/9 09/21/2018  Decreased Interest 0  Down, Depressed, Hopeless 0  PHQ - 2 Score 0    ADLs:  In your present state of health, do  you have any difficulty performing the following activities: 09/21/2018 06/15/2018  Hearing? N N  Vision? N N  Difficulty concentrating or making decisions? N N  Walking or climbing stairs? N N  Dressing or bathing? N N  Doing errands, shopping? N N  Some recent data might be hidden     Cognitive Testing  Alert? Yes  Normal Appearance?Yes  Oriented to person? Yes   Place? Yes   Time? Yes  Recall of three objects?  Yes  Can perform simple calculations? Yes  Displays appropriate judgment?Yes  Can read the correct time from a watch face?Yes  EOL planning: Does Patient Have a Medical Advance Directive?: Yes Type of Advance Directive: [place] Does patient want to make changes to medical advance directive?: No - Patient declined Copy of [place] in Chart?: Yes - validated most recent copy scanned in chart (See row information)   Objective:   Today's Vitals   09/21/18 1021  BP: 128/76  Pulse: 68  Resp: 16  Temp: 97.9 F (36.6 C)  Weight: 146 lb 6.4 oz (66.4 kg)  Height: 5' 6.5" (1.689 m)   Body mass index is 23.28 kg/m.  General appearance: alert, no distress, WD/WN, male HEENT: normocephalic, sclerae anicteric, TMs pearly, nares patent, no discharge or erythema, pharynx normal Oral cavity: MMM, no lesions Neck: supple, no lymphadenopathy, no thyromegaly, no masses Heart: RRR, normal S1, S2, no murmurs Lungs: CTA bilaterally, no wheezes, rhonchi, or rales Abdomen: +bs, soft, non tender, non distended, no masses, no hepatomegaly, no splenomegaly Musculoskeletal: nontender, no swelling, no obvious deformity. He has mild left heel anterior/medial tenderness without palpable abnormality.  Extremities: no edema, no cyanosis, no clubbing Pulses: 2+ symmetric, upper and lower extremities, normal cap refill Neurological: alert, oriented x 3, CN2-12 intact, strength normal upper extremities and lower extremities, sensation normal throughout, DTRs 2+ throughout, no cerebellar signs, gait normal Psychiatric: normal affect, behavior normal, pleasant   Medicare Attestation I have personally reviewed: The patient's medical and social history Their use of alcohol, tobacco or illicit drugs Their current medications and supplements The patient's functional ability including ADLs,fall risks, home safety risks,  cognitive, and hearing and visual impairment Diet and physical activities Evidence for depression or mood disorders  The patient's weight, height, BMI, and visual acuity have been recorded in the chart.  I have made referrals, counseling, and provided education to the patient based on review of the above and I have provided the patient with a written personalized care plan for preventive services.     Izora Ribas, NP   09/21/2018

## 2018-09-21 ENCOUNTER — Encounter: Payer: Self-pay | Admitting: Adult Health

## 2018-09-21 ENCOUNTER — Other Ambulatory Visit: Payer: Self-pay

## 2018-09-21 ENCOUNTER — Other Ambulatory Visit: Payer: Self-pay | Admitting: *Deleted

## 2018-09-21 ENCOUNTER — Ambulatory Visit (INDEPENDENT_AMBULATORY_CARE_PROVIDER_SITE_OTHER): Payer: Medicare HMO | Admitting: Adult Health

## 2018-09-21 VITALS — BP 128/76 | HR 68 | Temp 97.9°F | Resp 16 | Ht 66.5 in | Wt 146.4 lb

## 2018-09-21 DIAGNOSIS — C61 Malignant neoplasm of prostate: Secondary | ICD-10-CM | POA: Diagnosis not present

## 2018-09-21 DIAGNOSIS — M722 Plantar fascial fibromatosis: Secondary | ICD-10-CM

## 2018-09-21 DIAGNOSIS — Z0000 Encounter for general adult medical examination without abnormal findings: Secondary | ICD-10-CM

## 2018-09-21 DIAGNOSIS — E559 Vitamin D deficiency, unspecified: Secondary | ICD-10-CM | POA: Diagnosis not present

## 2018-09-21 DIAGNOSIS — I10 Essential (primary) hypertension: Secondary | ICD-10-CM | POA: Diagnosis not present

## 2018-09-21 DIAGNOSIS — R7309 Other abnormal glucose: Secondary | ICD-10-CM | POA: Diagnosis not present

## 2018-09-21 DIAGNOSIS — Z79899 Other long term (current) drug therapy: Secondary | ICD-10-CM | POA: Diagnosis not present

## 2018-09-21 DIAGNOSIS — K219 Gastro-esophageal reflux disease without esophagitis: Secondary | ICD-10-CM | POA: Diagnosis not present

## 2018-09-21 DIAGNOSIS — R6889 Other general symptoms and signs: Secondary | ICD-10-CM

## 2018-09-21 DIAGNOSIS — E782 Mixed hyperlipidemia: Secondary | ICD-10-CM

## 2018-09-21 DIAGNOSIS — D860 Sarcoidosis of lung: Secondary | ICD-10-CM

## 2018-09-21 DIAGNOSIS — R972 Elevated prostate specific antigen [PSA]: Secondary | ICD-10-CM | POA: Insufficient documentation

## 2018-09-21 DIAGNOSIS — Z6823 Body mass index (BMI) 23.0-23.9, adult: Secondary | ICD-10-CM

## 2018-09-21 DIAGNOSIS — Z0001 Encounter for general adult medical examination with abnormal findings: Secondary | ICD-10-CM

## 2018-09-21 MED ORDER — FAMOTIDINE 20 MG PO TABS: ORAL_TABLET | ORAL | 3 refills | Status: DC

## 2018-09-21 NOTE — Patient Instructions (Signed)
[name] , Thank you for taking time to come for your Medicare Wellness Visit. I appreciate your ongoing commitment to your health goals. Please review the following plan we discussed and let me know if I can assist you in the future.   These are the goals we discussed: Goals    . Blood Pressure < 130/80    . Exercise 150 min/wk Moderate Activity    . LDL CALC < 100       This is a list of the screening recommended for you and due dates:  Health Maintenance  Topic Date Due  . Flu Shot  10/15/2018  . Tetanus Vaccine  05/16/2025  . Pneumonia vaccines  Completed       Plantar Fasciitis  Plantar fasciitis is a painful foot condition that affects the heel. It occurs when the band of tissue that connects the toes to the heel bone (plantar fascia) becomes irritated. This can happen as the result of exercising too much or doing other repetitive activities (overuse injury). The pain from plantar fasciitis can range from mild irritation to severe pain that makes it difficult to walk or move. The pain is usually worse in the morning after sleeping, or after sitting or lying down for a while. Pain may also be worse after long periods of walking or standing. What are the causes? This condition may be caused by:  Standing for long periods of time.  Wearing shoes that do not have good arch support.  Doing activities that put stress on joints (high-impact activities), including running, aerobics, and ballet.  Being overweight.  An abnormal way of walking (gait).  Tight muscles in the back of your lower leg (calf).  High arches in your feet.  Starting a new athletic activity. What are the signs or symptoms? The main symptom of this condition is heel pain. Pain may:  Be worse with first steps after a time of rest, especially in the morning after sleeping or after you have been sitting or lying down for a while.  Be worse after long periods of standing still.  Decrease after  30-45 minutes of activity, such as gentle walking. How is this diagnosed? This condition may be diagnosed based on your medical history and your symptoms. Your health care provider may ask questions about your activity level. Your health care provider will do a physical exam to check for:  A tender area on the bottom of your foot.  A high arch in your foot.  Pain when you move your foot.  Difficulty moving your foot. You may have imaging tests to confirm the diagnosis, such as:  X-rays.  Ultrasound.  MRI. How is this treated? Treatment for plantar fasciitis depends on how severe your condition is. Treatment may include:  Rest, ice, applying pressure (compression), and raising the affected foot (elevation). This may be called RICE therapy. Your health care provider may recommend RICE therapy along with over-the-counter pain medicines to manage your pain.  Exercises to stretch your calves and your plantar fascia.  A splint that holds your foot in a stretched, upward position while you sleep (night splint).  Physical therapy to relieve symptoms and prevent problems in the future.  Injections of steroid medicine (cortisone) to relieve pain and inflammation.  Stimulating your plantar fascia with electrical impulses (extracorporeal shock wave therapy). This is usually the last treatment option before surgery.  Surgery, if other treatments have not worked after 12 months. Follow these instructions at home:  Managing pain,  stiffness, and swelling  If directed, put ice on the painful area: ? Put ice in a plastic bag, or use a frozen bottle of water. ? Place a towel between your skin and the bag or bottle. ? Roll the bottom of your foot over the bag or bottle. ? Do this for 20 minutes, 2-3 times a day.  Wear athletic shoes that have air-sole or gel-sole cushions, or try wearing soft shoe inserts that are designed for plantar fasciitis.  Raise (elevate) your foot above the level of  your heart while you are sitting or lying down. Activity  Avoid activities that cause pain. Ask your health care provider what activities are safe for you.  Do physical therapy exercises and stretches as told by your health care provider.  Try activities and forms of exercise that are easier on your joints (low-impact). Examples include swimming, water aerobics, and biking. General instructions  Take over-the-counter and prescription medicines only as told by your health care provider.  Wear a night splint while sleeping, if told by your health care provider. Loosen the splint if your toes tingle, become numb, or turn cold and blue.  Maintain a healthy weight, or work with your health care provider to lose weight as needed.  Keep all follow-up visits as told by your health care provider. This is important. Contact a health care provider if you:  Have symptoms that do not go away after caring for yourself at home.  Have pain that gets worse.  Have pain that affects your ability to move or do your daily activities. Summary  Plantar fasciitis is a painful foot condition that affects the heel. It occurs when the band of tissue that connects the toes to the heel bone (plantar fascia) becomes irritated.  The main symptom of this condition is heel pain that may be worse after exercising too much or standing still for a long time.  Treatment varies, but it usually starts with rest, ice, compression, and elevation (RICE therapy) and over-the-counter medicines to manage pain. This information is not intended to replace advice given to you by your health care provider. Make sure you discuss any questions you have with your health care provider. Document Released: 11/25/2000 Document Revised: 02/12/2017 Document Reviewed: 12/28/2016 Elsevier Patient Education  2020 [employer].      Plantar Fasciitis Rehab Ask your health care provider which exercises are safe for you. Do exercises  exactly as told by your health care provider and adjust them as directed. It is normal to feel mild stretching, pulling, tightness, or discomfort as you do these exercises. Stop right away if you feel sudden pain or your pain gets worse. Do not begin these exercises until told by your health care provider. Stretching and range-of-motion exercises These exercises warm up your muscles and joints and improve the movement and flexibility of your foot. These exercises also help to relieve pain. Plantar fascia stretch  1. Sit with your left / right leg crossed over your opposite knee. 2. Hold your heel with one hand with that thumb near your arch. With your other hand, hold your toes and gently pull them back toward the top of your foot. You should feel a stretch on the bottom of your toes or your foot (plantar fascia) or both. 3. Hold this stretch for__________ seconds. 4. Slowly release your toes and return to the starting position. Repeat __________ times. Complete this exercise __________ times a day. Gastrocnemius stretch, standing This exercise is also called a  calf (gastroc) stretch. It stretches the muscles in the back of the upper calf. 1. Stand with your hands against a wall. 2. Extend your left / right leg behind you, and bend your front knee slightly. 3. Keeping your heels on the floor and your back knee straight, shift your weight toward the wall. Do not arch your back. You should feel a gentle stretch in your upper left / right calf. 4. Hold this position for __________ seconds. Repeat __________ times. Complete this exercise __________ times a day. Soleus stretch, standing This exercise is also called a calf (soleus) stretch. It stretches the muscles in the back of the lower calf. 1. Stand with your hands against a wall. 2. Extend your left / right leg behind you, and bend your front knee slightly. 3. Keeping your heels on the floor, bend your back knee and shift your weight slightly  over your back leg. You should feel a gentle stretch deep in your lower calf. 4. Hold this position for __________ seconds. Repeat __________ times. Complete this exercise __________ times a day. Gastroc and soleus stretch, standing step This exercise stretches the muscles in the back of the lower leg. These muscles are in the upper calf (gastrocnemius) and the lower calf (soleus). 1. Stand with the ball of your left / right foot on a step. The ball of your foot is on the walking surface, right under your toes. 2. Keep your other foot firmly on the same step. 3. Hold on to the wall or a railing for balance. 4. Slowly lift your other foot, allowing your body weight to press your left / right heel down over the edge of the step. You should feel a stretch in your left / right calf. 5. Hold this position for __________ seconds. 6. Return both feet to the step. 7. Repeat this exercise with a slight bend in your left / right knee. Repeat __________ times with your left / right knee straight and __________ times with your left / right knee bent. Complete this exercise __________ times a day. Balance exercise This exercise builds your balance and strength control of your arch to help take pressure off your plantar fascia. Single leg stand If this exercise is too easy, you can try it with your eyes closed or while standing on a pillow. 1. Without shoes, stand near a railing or in a doorway. You may hold on to the railing or door frame as needed. 2. Stand on your left / right foot. Keep your big toe down on the floor and try to keep your arch lifted. Do not let your foot roll inward. 3. Hold this position for __________ seconds. Repeat __________ times. Complete this exercise __________ times a day. This information is not intended to replace advice given to you by your health care provider. Make sure you discuss any questions you have with your health care provider. Document Released: 03/02/2005  Document Revised: 06/23/2018 Document Reviewed: 12/29/2017 Elsevier Patient Education  2020 [employer].

## 2018-09-22 LAB — CBC WITH DIFFERENTIAL/PLATELET
Absolute Monocytes: 561 cells/uL (ref 200–950)
Basophils Absolute: 50 cells/uL (ref 0–200)
Basophils Relative: 0.8 %
Eosinophils Absolute: 32 cells/uL (ref 15–500)
Eosinophils Relative: 0.5 %
HCT: 50.2 % — ABNORMAL HIGH (ref 38.5–50.0)
Hemoglobin: 16.7 g/dL (ref 13.2–17.1)
Lymphs Abs: 1273 cells/uL (ref 850–3900)
MCH: 30.4 pg (ref 27.0–33.0)
MCHC: 33.3 g/dL (ref 32.0–36.0)
MCV: 91.3 fL (ref 80.0–100.0)
MPV: 11.7 fL (ref 7.5–12.5)
Monocytes Relative: 8.9 %
Neutro Abs: 4385 cells/uL (ref 1500–7800)
Neutrophils Relative %: 69.6 %
Platelets: 196 10*3/uL (ref 140–400)
RBC: 5.5 10*6/uL (ref 4.20–5.80)
RDW: 12.4 % (ref 11.0–15.0)
Total Lymphocyte: 20.2 %
WBC: 6.3 10*3/uL (ref 3.8–10.8)

## 2018-09-22 LAB — COMPLETE METABOLIC PANEL WITH GFR
AG Ratio: 1.8 (calc) (ref 1.0–2.5)
ALT: 21 U/L (ref 9–46)
AST: 20 U/L (ref 10–35)
Albumin: 4.2 g/dL (ref 3.6–5.1)
Alkaline phosphatase (APISO): 55 U/L (ref 35–144)
BUN: 17 mg/dL (ref 7–25)
CO2: 27 mmol/L (ref 20–32)
Calcium: 9.8 mg/dL (ref 8.6–10.3)
Chloride: 105 mmol/L (ref 98–110)
Creat: 1.04 mg/dL (ref 0.70–1.11)
GFR, Est African American: 77 mL/min/{1.73_m2} (ref >=60)
GFR, Est Non African American: 66 mL/min/{1.73_m2} (ref >=60)
Globulin: 2.4 g/dL (calc) (ref 1.9–3.7)
Glucose, Bld: 83 mg/dL (ref 65–99)
Potassium: 4.1 mmol/L (ref 3.5–5.3)
Sodium: 140 mmol/L (ref 135–146)
Total Bilirubin: 0.8 mg/dL (ref 0.2–1.2)
Total Protein: 6.6 g/dL (ref 6.1–8.1)

## 2018-09-22 LAB — LIPID PANEL
Cholesterol: 170 mg/dL (ref <200)
HDL: 34 mg/dL — ABNORMAL LOW (ref >=40)
LDL Cholesterol (Calc): 108 mg/dL (calc) — ABNORMAL HIGH
Non-HDL Cholesterol (Calc): 136 mg/dL (calc) — ABNORMAL HIGH (ref <130)
Total CHOL/HDL Ratio: 5 (calc) — ABNORMAL HIGH (ref <5.0)
Triglycerides: 163 mg/dL — ABNORMAL HIGH (ref <150)

## 2018-09-22 LAB — MAGNESIUM: Magnesium: 2 mg/dL (ref 1.5–2.5)

## 2018-09-22 LAB — TSH: TSH: 1.12 mIU/L (ref 0.40–4.50)

## 2018-09-29 DIAGNOSIS — C61 Malignant neoplasm of prostate: Secondary | ICD-10-CM | POA: Diagnosis not present

## 2018-10-06 ENCOUNTER — Other Ambulatory Visit: Payer: Self-pay | Admitting: Urology

## 2018-10-06 DIAGNOSIS — C61 Malignant neoplasm of prostate: Secondary | ICD-10-CM | POA: Diagnosis not present

## 2018-10-06 DIAGNOSIS — N400 Enlarged prostate without lower urinary tract symptoms: Secondary | ICD-10-CM | POA: Diagnosis not present

## 2018-10-13 ENCOUNTER — Other Ambulatory Visit: Payer: Self-pay | Admitting: Internal Medicine

## 2018-10-13 ENCOUNTER — Other Ambulatory Visit: Payer: Self-pay | Admitting: Physician Assistant

## 2018-10-13 DIAGNOSIS — N401 Enlarged prostate with lower urinary tract symptoms: Secondary | ICD-10-CM

## 2018-11-03 ENCOUNTER — Inpatient Hospital Stay: Admission: RE | Admit: 2018-11-03 | Payer: PRIVATE HEALTH INSURANCE | Source: Ambulatory Visit

## 2018-12-02 ENCOUNTER — Ambulatory Visit
Admission: RE | Admit: 2018-12-02 | Discharge: 2018-12-02 | Disposition: A | Discharge status: Home / Self Care | Payer: Medicare Other | Source: Ambulatory Visit | Attending: Urology | Admitting: Urology

## 2018-12-02 ENCOUNTER — Other Ambulatory Visit: Payer: Self-pay

## 2018-12-02 DIAGNOSIS — C61 Malignant neoplasm of prostate: Secondary | ICD-10-CM | POA: Diagnosis not present

## 2018-12-02 MED ORDER — GADOBENATE DIMEGLUMINE 529 MG/ML IV SOLN
12.0000 mL | Freq: Once | INTRAVENOUS | Status: AC | PRN
  Administered 2018-12-02: 12 mL via INTRAVENOUS

## 2018-12-27 ENCOUNTER — Encounter: Payer: Self-pay | Admitting: Internal Medicine

## 2018-12-27 NOTE — Progress Notes (Signed)
Annual  Screening/Preventative Visit  & Comprehensive Evaluation & Examination     This very nice 85 y.o. WWM presents for a Screening /Preventative Visit & comprehensive evaluation and management of multiple medical co-morbidities.  Patient has been followed for HTN, HLD, Prediabetes and Vitamin D Deficiency.Patient is followed by Dr Junious Silk since 2014  & is on finasteride for Gleason 6 Prostate Ca . Patient recently had a repeat Prostate MRI to be reviewed with Dr Junious Silk.      HTN predates since 2003. Patient's BP has been controlled at home.  Today's BP is at goal - 120/84. Patient denies any cardiac symptoms as chest pain, palpitations, shortness of breath, dizziness or ankle swelling.     Patient is intolerant to Statins & Zetia and his  hyperlipidemia is not controlled with diet. Last lipids were not at goal:  Lab Results  Component Value Date   CHOL 170 09/21/2018   HDL 34 (L) 09/21/2018   LDLCALC 108 (H) 09/21/2018   TRIG 163 (H) 09/21/2018   CHOLHDL 5.0 (H) 09/21/2018      Patient has hx/o prediabetes (A1c 5.8% / 2008)  and patient denies reactive hypoglycemic symptoms, visual blurring, diabetic polys or paresthesias. Last A1c was Normal & at goal:  Lab Results  Component Value Date   HGBA1C 5.5 12/01/2017       Finally, patient has history of Vitamin D Deficiency  ("45 / 2008) and last vitamin D was at goal:   Lab Results  Component Value Date   VD25OH 52 12/01/2017   Current Outpatient Medications on File Prior to Visit  Medication Sig  . acetaminophen (TYLENOL) 325 MG tablet Take 650 mg by mouth every 6 (six) hours as needed (prn).  Marland Kitchen BABY ASPIRIN PO Take 81 mg by mouth daily.  . bisoprolol-hydrochlorothiazide (ZIAC) 10-6.25 MG tablet Take 1 tablet Daily for BP  . Cholecalciferol (VITAMIN D PO) Take 2,000 Int'l Units by mouth 2 (two) times daily.   . famotidine (PEPCID) 20 MG tablet Take 1 tablet 2 x /day as needed with meals for Acid Indigestion  . finasteride  (PROSCAR) 5 MG tablet TAKE 1 TABLET DAILY FOR    PROSTATE  . Flaxseed, Linseed, (FLAX SEED OIL PO) Take 1,200 mg by mouth 3 (three) times daily.   . Magnesium 250 MG TABS Take 250 mg by mouth daily.  . Omega-3 Fatty Acids (FISH OIL PO) Take 1,200 mg by mouth 2 (two) times daily.    No current facility-administered medications on file prior to visit.    Allergies  Allergen Reactions  . Pravastatin   . Red Yeast Rice [Cholestin]   . Sulfa Antibiotics   . Vibramycin [Doxycycline Calcium]   . Zetia [Ezetimibe]    Past Medical History:  Diagnosis Date  . Essential hypertension 02/15/2007  . Hyperlipidemia 07/13/2013  . Prediabetes 07/13/2013  . Pulmonary nodule (2008 resolved on f/u)  02/15/2007  . Vitamin D deficiency 07/13/2013   Health Maintenance  Topic Date Due  . INFLUENZA VACCINE  10/15/2018  . TETANUS/TDAP  05/16/2025  . PNA vac Low Risk Adult  Completed   Immunization History  Administered Date(s) Administered  . DT 05/17/2015  . Influenza, High Dose Seasonal PF 01/23/2014, 02/13/2015, 11/27/2015, 12/01/2016, 12/01/2017  . Pneumococcal Conjugate-13 10/12/2013  . Pneumococcal Polysaccharide-23 05/17/2015  . Zoster Recombinat (Shingrix) 08/13/2017, 10/14/2017   Last Colon - 03/2002 - Dr Deatra Ina  Cologard - 12/12/2017 - Negative - recc 3 year f/u due Oct 2022  Past Surgical  History:  Procedure Laterality Date  . TONSILECTOMY/ADENOIDECTOMY WITH MYRINGOTOMY    . TREATMENT FISTULA ANAL     Family History  Problem Relation Age of Onset  . Hyperlipidemia Brother   . Hypertension Brother    Social History   Socioeconomic History  . Marital status: Widowed  . Number of children: None  Occupational History  . 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 Smoking status: Never Smoker  . Smokeless tobacco: Never Used  Substance and Sexual Activity  . Alcohol use: No  . Drug use: Not on file  . Sexual activity: Not on file    ROS Constitutional: Denies  fever, chills, weight loss/gain, headaches, insomnia,  night sweats or change in appetite. Does c/o fatigue. Eyes: Denies redness, blurred vision, diplopia, discharge, itchy or watery eyes.  ENT: Denies discharge, congestion, post nasal drip, epistaxis, sore throat, earache, hearing loss, dental pain, Tinnitus, Vertigo, Sinus pain or snoring.  Cardio: Denies chest pain, palpitations, irregular heartbeat, syncope, dyspnea, diaphoresis, orthopnea, PND, claudication or edema Respiratory: denies cough, dyspnea, DOE, pleurisy, hoarseness, laryngitis or wheezing.  Gastrointestinal: Denies dysphagia, heartburn, reflux, water brash, pain, cramps, nausea, vomiting, bloating, diarrhea, constipation, hematemesis, melena, hematochezia, jaundice or hemorrhoids Genitourinary: Denies dysuria, frequency, urgency, nocturia, hesitancy, discharge, hematuria or flank pain Musculoskeletal: Denies arthralgia, myalgia, stiffness, Jt. Swelling, pain, limp or strain/sprain. Denies Falls. Skin: Denies puritis, rash, hives, warts, acne, eczema or change in skin lesion Neuro: No weakness, tremor, incoordination, spasms, paresthesia or pain Psychiatric: Denies confusion, memory loss or sensory loss. Denies Depression. Endocrine: Denies change in weight, skin, hair change, nocturia, and paresthesia, diabetic polys, visual blurring or hyper / hypo glycemic episodes.  Heme/Lymph: No excessive bleeding, bruising or enlarged lymph nodes.  Physical Exam  BP 120/84   Pulse 64   Temp 97.9 F (36.6 C)   Resp 16   Ht 5\' 6"  (1.676 m)   Wt 146 lb 6.4 oz (66.4 kg)   BMI 23.63 kg/m   General Appearance: Well nourished and well groomed and in no apparent distress.  Eyes: PERRLA, EOMs, conjunctiva no swelling or erythema, normal fundi and vessels. Sinuses: No frontal/maxillary tenderness ENT/Mouth: EACs patent / TMs  nl. Nares clear without erythema, swelling, mucoid exudates. Oral hygiene is good. No erythema, swelling, or  exudate. Tongue normal, non-obstructing. Tonsils not swollen or erythematous. Hearing normal.  Neck: Supple, thyroid not palpable. No bruits, nodes or JVD. Respiratory: Respiratory effort normal.  BS equal and clear bilateral without rales, rhonci, wheezing or stridor. Cardio: Heart sounds are normal with regular rate and rhythm and no murmurs, rubs or gallops. Peripheral pulses are normal and equal bilaterally without edema. No aortic or femoral bruits. Chest: symmetric with normal excursions and percussion.  Abdomen: Soft, with Nl bowel sounds. Nontender, no guarding, rebound, hernias, masses, or organomegaly.  Lymphatics: Non tender without lymphadenopathy.  Musculoskeletal: Full ROM all peripheral extremities, joint stability, 5/5 strength, and normal gait. Skin: Warm and dry without rashes, lesions, cyanosis, clubbing or  ecchymosis.  Neuro: Cranial nerves intact, reflexes equal bilaterally. Normal muscle tone, no cerebellar symptoms. Sensation intact.  Pysch: Alert and oriented X 3 with normal affect, insight and judgment appropriate.   Assessment and Plan  1. Annual Preventative/Screening Exam   1. Encounter for general adult medical examination with abnormal findings   2. Essential hypertension  - EKG 12-Lead - [place], RETROPERITNL ABD,  LTD - Urinalysis, Routine w reflex microscopic - Microalbumin / creatinine urine ratio - CBC with  Differential/Platelet - COMPLETE METABOLIC PANEL WITH GFR - Magnesium - TSH  3. Hyperlipidemia, mixed  - EKG 12-Lead - [place], RETROPERITNL ABD,  LTD - Lipid panel - TSH  4. Abnormal glucose  - EKG 12-Lead - [place], RETROPERITNL ABD,  LTD - Hemoglobin A1c - Insulin, random  5. Vitamin D deficiency  - VITAMIN D 25 Hydroxy (Vit-D Deficiency, Fractures)  6. Prediabetes  - EKG 12-Lead - [place], RETROPERITNL ABD,  LTD - Hemoglobin A1c - Insulin, random  7. Gastroesophageal reflux disease without esophagitis  - CBC with Differential/Platelet -  pantoprazole (PROTONIX) 40 MG tablet; Take 1 tablet every Morning for Indigestion & Heartburn  Dispense: 90 tablet; Refill: 3  8. History of prostate cancer   9. Screening for colorectal cancer  - POC Hemoccult Bld/Stl (3-Cd Home Screen); Future  10. Screening for ischemic heart disease  - EKG 12-Lead  11. FHx: heart disease  - EKG 12-Lead - [place], RETROPERITNL ABD,  LTD  12. Screening for AAA (aortic abdominal aneurysm)  - [place], RETROPERITNL ABD,  LTD  13. Medication management  - Urinalysis, Routine w reflex microscopic - Microalbumin / creatinine urine ratio - CBC with Differential/Platelet - COMPLETE METABOLIC PANEL WITH GFR - Magnesium - Lipid panel - TSH - Hemoglobin A1c - Insulin, random - VITAMIN D 25 Hydroxyl         Patient was counseled in prudent diet, weight control to achieve/maintain BMI less than 25, BP monitoring, regular exercise and medications as discussed.  Discussed med effects and SE's. Routine screening labs and tests as requested with regular follow-up as recommended. Over 40 minutes of exam, counseling, chart review and high complex critical decision making was performed   Kirtland Bouchard, MD

## 2018-12-27 NOTE — Patient Instructions (Signed)

## 2018-12-28 ENCOUNTER — Other Ambulatory Visit: Payer: Self-pay

## 2018-12-28 ENCOUNTER — Ambulatory Visit (INDEPENDENT_AMBULATORY_CARE_PROVIDER_SITE_OTHER): Payer: Medicare HMO | Admitting: Internal Medicine

## 2018-12-28 VITALS — BP 120/84 | HR 64 | Temp 97.9°F | Resp 16 | Ht 66.0 in | Wt 146.4 lb

## 2018-12-28 DIAGNOSIS — Z136 Encounter for screening for cardiovascular disorders: Secondary | ICD-10-CM

## 2018-12-28 DIAGNOSIS — Z79899 Other long term (current) drug therapy: Secondary | ICD-10-CM

## 2018-12-28 DIAGNOSIS — I10 Essential (primary) hypertension: Secondary | ICD-10-CM | POA: Diagnosis not present

## 2018-12-28 DIAGNOSIS — Z8249 Family history of ischemic heart disease and other diseases of the circulatory system: Secondary | ICD-10-CM | POA: Diagnosis not present

## 2018-12-28 DIAGNOSIS — Z0000 Encounter for general adult medical examination without abnormal findings: Secondary | ICD-10-CM | POA: Diagnosis not present

## 2018-12-28 DIAGNOSIS — E782 Mixed hyperlipidemia: Secondary | ICD-10-CM

## 2018-12-28 DIAGNOSIS — R7303 Prediabetes: Secondary | ICD-10-CM | POA: Diagnosis not present

## 2018-12-28 DIAGNOSIS — K219 Gastro-esophageal reflux disease without esophagitis: Secondary | ICD-10-CM | POA: Diagnosis not present

## 2018-12-28 DIAGNOSIS — R7309 Other abnormal glucose: Secondary | ICD-10-CM | POA: Diagnosis not present

## 2018-12-28 DIAGNOSIS — Z0001 Encounter for general adult medical examination with abnormal findings: Secondary | ICD-10-CM

## 2018-12-28 DIAGNOSIS — E559 Vitamin D deficiency, unspecified: Secondary | ICD-10-CM | POA: Diagnosis not present

## 2018-12-28 DIAGNOSIS — Z1211 Encounter for screening for malignant neoplasm of colon: Secondary | ICD-10-CM

## 2018-12-28 DIAGNOSIS — R972 Elevated prostate specific antigen [PSA]: Secondary | ICD-10-CM | POA: Diagnosis not present

## 2018-12-28 DIAGNOSIS — N138 Other obstructive and reflux uropathy: Secondary | ICD-10-CM | POA: Diagnosis not present

## 2018-12-28 DIAGNOSIS — Z8546 Personal history of malignant neoplasm of prostate: Secondary | ICD-10-CM

## 2018-12-28 DIAGNOSIS — N401 Enlarged prostate with lower urinary tract symptoms: Secondary | ICD-10-CM | POA: Diagnosis not present

## 2018-12-28 MED ORDER — PANTOPRAZOLE SODIUM 40 MG PO TBEC: DELAYED_RELEASE_TABLET | ORAL | 3 refills | Status: DC

## 2018-12-29 LAB — COMPLETE METABOLIC PANEL WITH GFR
AG Ratio: 1.7 (calc) (ref 1.0–2.5)
ALT: 30 U/L (ref 9–46)
AST: 21 U/L (ref 10–35)
Albumin: 4.3 g/dL (ref 3.6–5.1)
Alkaline phosphatase (APISO): 61 U/L (ref 35–144)
BUN: 15 mg/dL (ref 7–25)
CO2: 29 mmol/L (ref 20–32)
Calcium: 9.6 mg/dL (ref 8.6–10.3)
Chloride: 104 mmol/L (ref 98–110)
Creat: 1.02 mg/dL (ref 0.70–1.11)
GFR, Est African American: 78 mL/min/{1.73_m2} (ref >=60)
GFR, Est Non African American: 68 mL/min/{1.73_m2} (ref >=60)
Globulin: 2.5 g/dL (calc) (ref 1.9–3.7)
Glucose, Bld: 99 mg/dL (ref 65–99)
Potassium: 4 mmol/L (ref 3.5–5.3)
Sodium: 141 mmol/L (ref 135–146)
Total Bilirubin: 0.8 mg/dL (ref 0.2–1.2)
Total Protein: 6.8 g/dL (ref 6.1–8.1)

## 2018-12-29 LAB — LIPID PANEL
Cholesterol: 171 mg/dL (ref <200)
HDL: 36 mg/dL — ABNORMAL LOW (ref >=40)
LDL Cholesterol (Calc): 103 mg/dL (calc) — ABNORMAL HIGH
Non-HDL Cholesterol (Calc): 135 mg/dL (calc) — ABNORMAL HIGH (ref <130)
Total CHOL/HDL Ratio: 4.8 (calc) (ref <5.0)
Triglycerides: 196 mg/dL — ABNORMAL HIGH (ref <150)

## 2018-12-29 LAB — CBC WITH DIFFERENTIAL/PLATELET
Absolute Monocytes: 619 cells/uL (ref 200–950)
Basophils Absolute: 48 cells/uL (ref 0–200)
Basophils Relative: 0.7 %
Eosinophils Absolute: 41 cells/uL (ref 15–500)
Eosinophils Relative: 0.6 %
HCT: 50 % (ref 38.5–50.0)
Hemoglobin: 16.4 g/dL (ref 13.2–17.1)
Lymphs Abs: 1421 cells/uL (ref 850–3900)
MCH: 29.8 pg (ref 27.0–33.0)
MCHC: 32.8 g/dL (ref 32.0–36.0)
MCV: 90.9 fL (ref 80.0–100.0)
MPV: 11.6 fL (ref 7.5–12.5)
Monocytes Relative: 9.1 %
Neutro Abs: 4672 cells/uL (ref 1500–7800)
Neutrophils Relative %: 68.7 %
Platelets: 191 10*3/uL (ref 140–400)
RBC: 5.5 10*6/uL (ref 4.20–5.80)
RDW: 12.4 % (ref 11.0–15.0)
Total Lymphocyte: 20.9 %
WBC: 6.8 10*3/uL (ref 3.8–10.8)

## 2018-12-29 LAB — URINALYSIS, ROUTINE W REFLEX MICROSCOPIC
Bilirubin Urine: NEGATIVE
Glucose, UA: NEGATIVE
Hgb urine dipstick: NEGATIVE
Ketones, ur: NEGATIVE
Leukocytes,Ua: NEGATIVE
Nitrite: NEGATIVE
Protein, ur: NEGATIVE
Specific Gravity, Urine: 1.01 (ref 1.001–1.03)
pH: 7 (ref 5.0–8.0)

## 2018-12-29 LAB — MICROALBUMIN / CREATININE URINE RATIO
Creatinine, Urine: 43 mg/dL (ref 20–320)
Microalb, Ur: <0.2 mg/dL

## 2018-12-29 LAB — INSULIN, RANDOM: Insulin: 27.8 u[IU]/mL — ABNORMAL HIGH

## 2018-12-29 LAB — VITAMIN D 25 HYDROXY (VIT D DEFICIENCY, FRACTURES): Vit D, 25-Hydroxy: 81 ng/mL (ref 30–100)

## 2018-12-29 LAB — HEMOGLOBIN A1C
Hgb A1c MFr Bld: 5.5 % of total Hgb (ref <5.7)
Mean Plasma Glucose: 111 (calc)
eAG (mmol/L): 6.2 (calc)

## 2018-12-29 LAB — MAGNESIUM: Magnesium: 2 mg/dL (ref 1.5–2.5)

## 2018-12-29 LAB — TSH: TSH: 0.91 mIU/L (ref 0.40–4.50)

## 2019-01-03 ENCOUNTER — Encounter: Payer: Self-pay | Admitting: *Deleted

## 2019-01-05 ENCOUNTER — Other Ambulatory Visit: Payer: Self-pay

## 2019-01-05 ENCOUNTER — Ambulatory Visit (INDEPENDENT_AMBULATORY_CARE_PROVIDER_SITE_OTHER): Payer: Medicare HMO | Admitting: *Deleted

## 2019-01-05 VITALS — Temp 97.0°F

## 2019-01-05 DIAGNOSIS — Z23 Encounter for immunization: Secondary | ICD-10-CM | POA: Diagnosis not present

## 2019-01-05 DIAGNOSIS — Z1211 Encounter for screening for malignant neoplasm of colon: Secondary | ICD-10-CM

## 2019-01-05 LAB — POC HEMOCCULT BLD/STL (HOME/3-CARD/SCREEN)
Card #2 Fecal Occult Blod, POC: NEGATIVE
Card #3 Fecal Occult Blood, POC: NEGATIVE
Fecal Occult Blood, POC: NEGATIVE

## 2019-01-06 DIAGNOSIS — C61 Malignant neoplasm of prostate: Secondary | ICD-10-CM | POA: Diagnosis not present

## 2019-01-13 DIAGNOSIS — C61 Malignant neoplasm of prostate: Secondary | ICD-10-CM | POA: Diagnosis not present

## 2019-02-20 DIAGNOSIS — E785 Hyperlipidemia, unspecified: Secondary | ICD-10-CM | POA: Diagnosis not present

## 2019-02-20 DIAGNOSIS — R69 Illness, unspecified: Secondary | ICD-10-CM | POA: Diagnosis not present

## 2019-02-20 DIAGNOSIS — E039 Hypothyroidism, unspecified: Secondary | ICD-10-CM | POA: Diagnosis not present

## 2019-03-08 DIAGNOSIS — C61 Malignant neoplasm of prostate: Secondary | ICD-10-CM | POA: Diagnosis not present

## 2019-03-20 ENCOUNTER — Other Ambulatory Visit (HOSPITAL_COMMUNITY): Payer: Self-pay | Admitting: Urology

## 2019-03-20 DIAGNOSIS — C61 Malignant neoplasm of prostate: Secondary | ICD-10-CM

## 2019-03-23 DIAGNOSIS — C61 Malignant neoplasm of prostate: Secondary | ICD-10-CM | POA: Diagnosis not present

## 2019-03-27 DIAGNOSIS — C61 Malignant neoplasm of prostate: Secondary | ICD-10-CM | POA: Diagnosis not present

## 2019-03-28 ENCOUNTER — Encounter: Payer: Self-pay | Admitting: *Deleted

## 2019-03-28 DIAGNOSIS — Z961 Presence of intraocular lens: Secondary | ICD-10-CM | POA: Diagnosis not present

## 2019-03-28 DIAGNOSIS — H524 Presbyopia: Secondary | ICD-10-CM | POA: Diagnosis not present

## 2019-03-29 ENCOUNTER — Encounter: Payer: Self-pay | Admitting: *Deleted

## 2019-03-29 ENCOUNTER — Encounter (HOSPITAL_COMMUNITY)
Admission: RE | Admit: 2019-03-29 | Discharge: 2019-03-29 | Disposition: A | Discharge status: Home / Self Care | Payer: Medicare HMO | Source: Ambulatory Visit | Attending: Urology | Admitting: Urology

## 2019-03-29 ENCOUNTER — Other Ambulatory Visit: Payer: Self-pay

## 2019-03-29 DIAGNOSIS — C61 Malignant neoplasm of prostate: Secondary | ICD-10-CM

## 2019-03-29 DIAGNOSIS — R972 Elevated prostate specific antigen [PSA]: Secondary | ICD-10-CM | POA: Diagnosis not present

## 2019-03-29 DIAGNOSIS — Z8546 Personal history of malignant neoplasm of prostate: Secondary | ICD-10-CM | POA: Diagnosis not present

## 2019-03-29 MED ORDER — TECHNETIUM TC 99M MEDRONATE IV KIT: 20.0000 | PACK | Freq: Once | INTRAVENOUS | Status: DC | PRN

## 2019-03-29 MED ORDER — TECHNETIUM TC 99M MEDRONATE IV KIT
20.0000 | PACK | Freq: Once | INTRAVENOUS | Status: AC | PRN
  Administered 2019-03-29: 22 via INTRAVENOUS

## 2019-04-04 ENCOUNTER — Telehealth: Payer: Self-pay | Admitting: Radiation Oncology

## 2019-04-04 NOTE — Telephone Encounter (Signed)
Received voicemail message from patient requesting return call. Phoned patient back. Patient questions if he may get a covid 19 vaccine. Explained from a radiation therapy standpoint there are no contraindications. Patient verbalized understanding. Patient confirmed appointment for 04/11/2019 and expressed appreciation for the return call.

## 2019-04-10 ENCOUNTER — Encounter: Payer: Self-pay | Admitting: Radiation Oncology

## 2019-04-10 NOTE — Progress Notes (Signed)
GU Location of Tumor / Histology: prostatic adenocarcinoma  If Prostate Cancer, Gleason Score is (4 + 4) and PSA is (9.8). Prostate volume: 158.31 with median lobe and 105 without.  Grayling Congress Cranmer was diagnosed with gleason 3+3 prostate cancer (in one core) in 2014. Patient began finasteride and opted for active surveillance.  Biopsies of prostate (if applicable) revealed:   Past/Anticipated interventions by urology, if any: prostate biopsy, finasteride, active surveillance, repeat biopsy, CT negative for mets, Bone scan negative for mets, referral for consideration of radiotherapy  Past/Anticipated interventions by medical oncology, if any: no  Weight changes, if any: no  Bowel/Bladder complaints, if any: IPSS 3. SHIM 2. Denies dysuria or hematuria. Denies urinary leakage or incontinence. Denies any bowel complaints.    Nausea/Vomiting, if any: no  Pain issues, if any:  denies  SAFETY ISSUES:  Prior radiation? denies  Pacemaker/ICD? denies  Possible current pregnancy? no, male patient  Is the patient on methotrexate? denies  Current Complaints / other details:  86 year old male. Married. Will need 18 months ADT (none received thus far).

## 2019-04-11 ENCOUNTER — Ambulatory Visit
Admission: RE | Admit: 2019-04-11 | Discharge: 2019-04-11 | Disposition: A | Discharge status: Home / Self Care | Payer: Medicare HMO | Source: Ambulatory Visit | Attending: Radiation Oncology | Admitting: Radiation Oncology

## 2019-04-11 ENCOUNTER — Other Ambulatory Visit: Payer: Self-pay

## 2019-04-11 ENCOUNTER — Encounter: Payer: Self-pay | Admitting: Radiation Oncology

## 2019-04-11 VITALS — Ht 67.0 in | Wt 140.0 lb

## 2019-04-11 DIAGNOSIS — C61 Malignant neoplasm of prostate: Secondary | ICD-10-CM

## 2019-04-11 DIAGNOSIS — R972 Elevated prostate specific antigen [PSA]: Secondary | ICD-10-CM | POA: Diagnosis not present

## 2019-04-11 DIAGNOSIS — N400 Enlarged prostate without lower urinary tract symptoms: Secondary | ICD-10-CM | POA: Diagnosis not present

## 2019-04-11 DIAGNOSIS — Z79899 Other long term (current) drug therapy: Secondary | ICD-10-CM | POA: Diagnosis not present

## 2019-04-11 HISTORY — DX: Malignant neoplasm of prostate: C61

## 2019-04-11 NOTE — Progress Notes (Signed)
See progress note under physician encounter. 

## 2019-04-11 NOTE — Progress Notes (Signed)
Radiation Oncology         [phone] ________________________________  Initial outpatient Consultation - Conducted via Telephone due to current COVID-19 concerns for limiting patient exposure  Name: Matthew Aguirre MRN: [id number]  Date: 04/11/2019  DOB: 03-09-1934  CC:Unk Pinto, MD  Festus Aloe, MD   REFERRING PHYSICIAN: Festus Aloe, MD  DIAGNOSIS: 86 y.o. 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    ICD-10-CM   1. Malignant neoplasm of prostate ([place])  C61   2. Prostate cancer [facility]) on active surveillance  C61     HISTORY OF PRESENT ILLNESS: Matthew Aguirre is a 86 y.o. male with a diagnosis of prostate cancer. He has a longstanding history of BPH dating back to 2004. He was seen by Dr. Junious Silk in 01/2003 and started on an alpha blocker. In 11/2003, he was started on a 5-ARI with Avodart after developing gross hematuria and undergoing cystoscopy. In 11/2004, TRUS measured the prostate at 114 g despite Avodart. His 5-ARI was switched to finasteride in 11/2007. When his PSA rose to 4.53 (9.06) in 04/2012, he underwent prostate biopsy in 06/2012. This showed Gleason 3+3 in 5% of one core. Prostate volume at that time was down to 97 g. They opted to continue with active PSA surveillance and finasteride.   His PSA continued to rise to 7.1 (14.2) in 09/2014, prompting a prostate MRI, performed in 03/2015, which was benign. His PSA continued to rise to 8.1 (16.2) in 2018, and prostate ultrasound performed in 11/2016 showed an increased prostate volume of 165 g. The PSA was slightly decreased to 6.58 (13.16) in 09/2017. CT A/P performed for surveillance in 10/2017 was again benign, showing a 150 g prostate.  More recently, his PSA was further elevated to 9.2 (18.4) in 09/2018. Therefore, a repeat prostate MRI was performed on 12/02/2018. This revealed a 1.5 cm left peripheral zone lesion left mid gland  with bulging of the capsule, possible invasion of the NVB consistent with extracapsular extension (PI-RADS 5), and prostate volume 140 g. Repeat PSA performed in 12/2018 remained elevated at 9.8 (19.6). The patient proceeded to MRI fusion biopsy with 15 biopsies of the prostate on 03/08/2019.  Out of 15 core biopsies, 5 were positive.  The maximum Gleason score was 4+4, and this was seen in the left mid lateral. Additionally, Gleason 4+3 was sen in the left base lateral, Gleason 3+4 was seen in the left apex lateral with perineural invasion and Gleason 3+3 was seen in two out of the three ROI MRI lesion cores, with one showing perineural invasion.  For disease staging, a CT pelvis was performed on 03/23/2019, which showed no evidence of visceral metastatic disease and bone scan performed on 03/29/2019 was negative for osseous metastatic disease.  The patient reviewed the biopsy results with his urologist and he has kindly been referred today for discussion of potential radiation treatment options.  PREVIOUS RADIATION THERAPY: No  PAST MEDICAL HISTORY:  Past Medical History:  Diagnosis Date  . Essential hypertension 02/15/2007  . Hyperlipidemia 07/13/2013  . Prediabetes 07/13/2013  . Prostate cancer ([place])   . Pulmonary nodule (2008 resolved on f/u)  02/15/2007  . Vitamin D deficiency 07/13/2013      PAST SURGICAL HISTORY: Past Surgical History:  Procedure Laterality Date  . PROSTATE BIOPSY  2020  . PROSTATE BIOPSY  2014  . TONSILECTOMY/ADENOIDECTOMY WITH MYRINGOTOMY    . TREATMENT FISTULA ANAL  FAMILY HISTORY:  Family History  Problem Relation Age of Onset  . Hyperlipidemia Brother   . Hypertension Brother   . Breast cancer Neg Hx   . Colon cancer Neg Hx   . Prostate cancer Neg Hx   . Pancreatic cancer Neg Hx     SOCIAL HISTORY:  Social History   Socioeconomic History  . Marital status: Widowed    Spouse name: Not on file  . Number of children: Not on file  . Years of  education: Not on file  . Highest education level: Not on file  Occupational History    Comment: retired  Tobacco Use  . Smoking status: Never Smoker  . Smokeless tobacco: Never Used  Substance and Sexual Activity  . Alcohol use: No  . Drug use: Never  . Sexual activity: Not Currently  Other Topics Concern  . Not on file  Social History Narrative  . Not on file   Social Determinants of Health   Financial Resource Strain:   . Difficulty of Paying Living Expenses: Not on file  Food Insecurity:   . Worried About Charity fundraiser in the Last Year: Not on file  . Ran Out of Food in the Last Year: Not on file  Transportation Needs:   . Lack of Transportation (Medical): Not on file  . Lack of Transportation (Non-Medical): Not on file  Physical Activity:   . Days of Exercise per Week: Not on file  . Minutes of Exercise per Session: Not on file  Stress:   . Feeling of Stress : Not on file  Social Connections:   . Frequency of Communication with Friends and Family: Not on file  . Frequency of Social Gatherings with Friends and Family: Not on file  . Attends Religious Services: Not on file  . Active Member of Clubs or Organizations: Not on file  . Attends Archivist Meetings: Not on file  . Marital Status: Not on file  Intimate Partner Violence:   . Fear of Current or Ex-Partner: Not on file  . Emotionally Abused: Not on file  . Physically Abused: Not on file  . Sexually Abused: Not on file    ALLERGIES: Pravastatin, Red yeast rice [cholestin], Sulfa antibiotics, Vibramycin [doxycycline calcium], and Zetia [ezetimibe]  MEDICATIONS:  Current Outpatient Medications  Medication Sig Dispense Refill  . acetaminophen (TYLENOL) 325 MG tablet Take 650 mg by mouth every 6 (six) hours as needed (prn).    Marland Kitchen aspirin 81 MG EC tablet Take by mouth.    . bisoprolol-hydrochlorothiazide (ZIAC) 10-6.25 MG tablet Take 1 tablet Daily for BP 90 tablet 1  . Cholecalciferol (VITAMIN D  PO) Take 2,000 Int'l Units by mouth 2 (two) times daily.     . famotidine (PEPCID) 20 MG tablet Take 1 tablet 2 x /day as needed with meals for Acid Indigestion 180 tablet 3  . finasteride (PROSCAR) 5 MG tablet TAKE 1 TABLET DAILY FOR    PROSTATE 90 tablet 1  . Flaxseed, Linseed, (FLAX SEED OIL PO) Take 1,200 mg by mouth 3 (three) times daily.     . Magnesium 250 MG TABS Take 250 mg by mouth daily.    . Omega-3 Fatty Acids (FISH OIL PO) Take 1,200 mg by mouth 2 (two) times daily.     . pantoprazole (PROTONIX) 40 MG tablet Take 1 tablet every Morning for Indigestion & Heartburn 90 tablet 3   No current facility-administered medications for this encounter.    REVIEW OF  SYSTEMS:  On review of systems, the patient reports that he is doing well overall. He denies any chest pain, shortness of breath, cough, fevers, chills, night sweats, unintended weight changes. He denies any bowel disturbances, and denies abdominal pain, nausea or vomiting. He denies any new musculoskeletal or joint aches or pains. His IPSS was  3, indicating mild urinary symptoms. He remains on finasteride. His SHIM was 2, indicating he has severe erectile dysfunction. A complete review of systems is obtained and is otherwise negative.    PHYSICAL EXAM:  Wt Readings from Last 3 Encounters:  04/11/19 140 lb (63.5 kg)  12/28/18 146 lb 6.4 oz (66.4 kg)  09/21/18 146 lb 6.4 oz (66.4 kg)   Temp Readings from Last 3 Encounters:  01/05/19 (!) 97 F (36.1 C)  12/28/18 97.9 F (36.6 C)  09/21/18 97.9 F (36.6 C)   BP Readings from Last 3 Encounters:  12/28/18 120/84  09/21/18 128/76  06/16/18 138/75   Pulse Readings from Last 3 Encounters:  12/28/18 64  09/21/18 68  03/07/18 64   Pain Assessment Pain Score: 0-No pain/10  Physical exam not performed in light of telephone consult visit format.   KPS = 90  100 - Normal; no complaints; no evidence of disease. 90   - Able to carry on normal activity; minor signs or  symptoms of disease. 80   - Normal activity with effort; some signs or symptoms of disease. 64   - Cares for self; unable to carry on normal activity or to do active work. 60   - Requires occasional assistance, but is able to care for most of his personal needs. 50   - Requires considerable assistance and frequent medical care. 84   - Disabled; requires special care and assistance. 49   - Severely disabled; hospital admission is indicated although death not imminent. 48   - Very sick; hospital admission necessary; active supportive treatment necessary. 10   - Moribund; fatal processes progressing rapidly. 0     - Dead  Karnofsky DA, Abelmann [place], Craver LS and Burchenal [facility] [phone]) The use of the nitrogen mustards in the palliative treatment of carcinoma: with particular reference to bronchogenic carcinoma Cancer 1 634-56  LABORATORY DATA:  Lab Results  Component Value Date   WBC 6.8 12/28/2018   HGB 16.4 12/28/2018   HCT 50.0 12/28/2018   MCV 90.9 12/28/2018   PLT 191 12/28/2018   Lab Results  Component Value Date   NA 141 12/28/2018   K 4.0 12/28/2018   CL 104 12/28/2018   CO2 29 12/28/2018   Lab Results  Component Value Date   ALT 30 12/28/2018   AST 21 12/28/2018   ALKPHOS 61 07/13/2016   BILITOT 0.8 12/28/2018     RADIOGRAPHY: NM Bone Scan Whole Body  Result Date: 03/29/2019 CLINICAL DATA:  History of prostate cancer with rising PSA a EXAM: NUCLEAR MEDICINE WHOLE BODY BONE SCAN TECHNIQUE: Whole body anterior and posterior images were obtained approximately 3 hours after intravenous injection of radiopharmaceutical. RADIOPHARMACEUTICALS:  22 mCi Technetium-29m MDP IV COMPARISON:  No recent comparison, only PET CT from 2008. FINDINGS: Increased activity about the elbow owes, first carpometacarpal joints, shoulders and bilateral knees, most suggestive of degenerative processes. No definite scintigraphic evidence of bony metastatic disease. Areas in the posterior spine likely  degenerative in the setting of levoconvex spinal curvature. Expected activity in the urinary tract. IMPRESSION: 1. No scintigraphic evidence of osseous metastatic disease. Electronically Signed   By: Cay Schillings  Wile M.D.   On: 03/29/2019 17:15      IMPRESSION/PLAN: This visit was conducted via Telephone to spare the patient unnecessary potential exposure in the healthcare setting during the current COVID-19 pandemic. 1. 86 y.o. gentleman with Stage T2/T3 adenocarcinoma of the prostate with Gleason Score of 4+4, and PSA of 9.8 (19.6) on finasteride. We discussed the patient's workup and outlined the nature of prostate cancer in this setting. The patient's T stage, Gleason's score, and PSA put him into the high risk group. Accordingly, he is eligible for a variety of potential treatment options including LT-ADT in combination with either 8 weeks of external radiation or 5 weeks of external radiation combined with a brachytherapy boost. We discussed the available radiation techniques, and focused on the details and logistics of delivery. The patient would not be a candidate for brachytherapy boost with a prostate volume of 140 on finasteride. We discussed and outlined the risks, benefits, short and long-term effects associated with radiotherapy and compared and contrasted these with prostatectomy. We discussed the role of SpaceOAR in reducing the rectal toxicity associated with radiotherapy. We also detailed the role of ADT in the treatment of high risk prostate cancer and outlined the associated side effects that could be expected with this therapy. We discussed the rationale behind the intentional delay in starting radiation for approximately 8 week following the start of ADT to allow for the radiosensitizing effects of this therapy.  He was encouraged to ask questions that were answered to his stated satisfaction.  At the end of the conversation, the patient is interested in moving forward with 8 weeks of  external beam therapy concurrent with LT-ADT. He has not received his first Lupron injection. We will share our discussion with Dr. Junious Silk and make arrangements for start of ADT, first available.  We will also help to coordinate for fiducial markers and SpaceOAR gel placement prior to simulation, to reduce rectal toxicity from radiotherapy . He will be scheduled for CT simulation/treatment planning in April, approximately 2 months out from his first dose of ADT in anticipation of beginning his daily radiation treatments shortly therafter. He appears to have a good understanding of his disease and our recommendations for treatment which are of curative intent and is comfortable and in agreement with the stated plan.  We enjoyed meeting him today and look forward to continuing to participate in his care.  Given current concerns for patient exposure during the COVID-19 pandemic, this encounter was conducted via telephone. The patient was notified in advance and was offered a MyChart meeting to allow for face to face communication but unfortunately reported that he did not have the appropriate resources/technology to support such a visit and instead preferred to proceed with telephone consult. The patient has given verbal consent for this type of encounter. The time spent during this encounter was 45 minutes. The attendants for this meeting include Tyler Pita MD, Ashlyn Bruning PA-C, [place], and patient, MEAGAN WALLING. During the encounter, Tyler Pita MD, Ashlyn Bruning PA-C, and scribe, Wilburn Mylar were located at [place].  Patient, Matthew Aguirre was located at home.    Nicholos Johns, PA-C    Tyler Pita, MD  [facility] Direct Dial: [phone]  Fax: [phone] [email or web address]  Skype  [email or web address]  This document serves as a record of services personally performed by Tyler Pita, MD and Freeman Caldron, PA-C. It was created on their behalf by Joellen Jersey  [place], a trained medical scribe. The creation of this record is based on the scribe's personal observations and the provider's statements to them. This document has been checked and approved by the attending provider.

## 2019-04-12 ENCOUNTER — Telehealth: Payer: Self-pay | Admitting: *Deleted

## 2019-04-12 NOTE — Telephone Encounter (Signed)
CALLED PATIENT TO INFORM OF ADT APPT. FOR 04-24-19 - ARRIVAL TIME- 2:15 PM @ DR. Lyndal Rainbow OFFICE

## 2019-04-14 ENCOUNTER — Telehealth: Payer: Self-pay | Admitting: Medical Oncology

## 2019-04-14 NOTE — Telephone Encounter (Signed)
Spoke with patient to introduce myself as the prostate nurse navigator and discuss my role. I was unable to meet him 1/26, when he consulted with Dr. Tammi Klippel. He states the consult went well and all questions were answered. He will has chosen ADT with 8 weeks of radiation. He is scheduled for ADT 2/2@ 10 am with Dr. Junious Silk. He confirmed this appointment. We discussed, radiation will not begin for about 8 weeks and Enid Derry will contact him to schedule gold markers and SpaceOar. He states that he has arranged for neighbors and church members to bring him to appointments. I discussed our transportation program in case he may be in need of transportation. I gave him my contact information and asked him to call me with questions or concerns. He voiced understanding.

## 2019-04-18 ENCOUNTER — Encounter: Payer: Self-pay | Admitting: Medical Oncology

## 2019-04-18 DIAGNOSIS — Z5111 Encounter for antineoplastic chemotherapy: Secondary | ICD-10-CM | POA: Diagnosis not present

## 2019-04-18 DIAGNOSIS — C61 Malignant neoplasm of prostate: Secondary | ICD-10-CM | POA: Diagnosis not present

## 2019-04-24 ENCOUNTER — Other Ambulatory Visit: Payer: Self-pay | Admitting: Physician Assistant

## 2019-04-24 ENCOUNTER — Other Ambulatory Visit: Payer: Self-pay | Admitting: Internal Medicine

## 2019-04-24 DIAGNOSIS — N401 Enlarged prostate with lower urinary tract symptoms: Secondary | ICD-10-CM

## 2019-05-01 ENCOUNTER — Ambulatory Visit: Payer: Medicare HMO | Attending: Internal Medicine

## 2019-05-01 ENCOUNTER — Other Ambulatory Visit: Payer: Self-pay

## 2019-05-01 DIAGNOSIS — Z23 Encounter for immunization: Secondary | ICD-10-CM | POA: Insufficient documentation

## 2019-05-01 NOTE — Progress Notes (Signed)
   Covid-19 Vaccination Clinic  Name:  NASIEM SIDENER    MRN: [id number] DOB: [birth year]  05/01/2019  Mr. Clevenger was observed post Covid-19 immunization for 15 minutes without incidence. He was provided with Vaccine Information Sheet and instruction to access the V-Safe system.   Mr. Friis was instructed to call 911 with any severe reactions post vaccine: Marland Kitchen Difficulty breathing  . Swelling of your face and throat  . A fast heartbeat  . A bad rash all over your body  . Dizziness and weakness    Immunizations Administered    Name Date Dose VIS Date Route   Moderna COVID-19 Vaccine 05/01/2019  2:08 PM 0.5 mL 02/14/2019 Intramuscular   Manufacturer: Moderna   Lot: [id number]   McMullinVO:[id number]

## 2019-05-26 ENCOUNTER — Other Ambulatory Visit: Payer: Self-pay | Admitting: Urology

## 2019-05-26 DIAGNOSIS — C61 Malignant neoplasm of prostate: Secondary | ICD-10-CM

## 2019-05-30 ENCOUNTER — Other Ambulatory Visit: Payer: Self-pay

## 2019-05-30 ENCOUNTER — Ambulatory Visit: Payer: Medicare HMO | Attending: Internal Medicine

## 2019-05-30 DIAGNOSIS — Z23 Encounter for immunization: Secondary | ICD-10-CM

## 2019-05-30 NOTE — Progress Notes (Signed)
   Covid-19 Vaccination Clinic  Name:  [name]    MRN: [id number] DOB: [birth year]  05/30/2019  Matthew Aguirre was observed post Covid-19 immunization for 15 minutes without incident. He was provided with Vaccine Information Sheet and instruction to access the V-Safe system.   [name] was instructed to call 911 with any severe reactions post vaccine: Marland Kitchen Difficulty breathing  . Swelling of face and throat  . A fast heartbeat  . A bad rash all over body  . Dizziness and weakness   Immunizations Administered    Name Date Dose VIS Date Route   Moderna COVID-19 Vaccine 05/30/2019  2:02 PM 0.5 mL 02/14/2019 Intramuscular   Manufacturer: Moderna   LotTL:[id number]   MarlettePO:[id number]

## 2019-06-20 ENCOUNTER — Telehealth: Payer: Self-pay | Admitting: *Deleted

## 2019-06-20 DIAGNOSIS — C61 Malignant neoplasm of prostate: Secondary | ICD-10-CM | POA: Diagnosis not present

## 2019-06-20 NOTE — Telephone Encounter (Signed)
Called patient to inform that sim has been moved to 06-30-19, moved due to not being able to get MRI until 06-30-19, lvm for a return call

## 2019-06-22 NOTE — Progress Notes (Signed)
FOLLOW UP  Assessment and Plan:   Hypertension Recently above goal; continue ziac, add losartan 25 mg daily x 2 weeks, increase to 50 mg if needed for goal. Follow up in 1 month.  Monitor blood pressure at home; patient to call if consistently greater than 130/80 Continue DASH diet.   Reminder to go to the ER if any CP, SOB, nausea, dizziness, severe HA, changes vision/speech, left arm numbness and tingling and jaw pain.  Cholesterol Currently near goal by lifestyle - Continue monitoring -  Continue low cholesterol diet and exercise.  Check lipid panel.   Other abnormal glucose Recent A1Cs at goal Discussed diet/exercise, weight management  Defer A1C; check CMP  Normal weight for adult - Long discussion about diet, and exercise Recommended diet heavy in fruits and veggies and low in animal meats, cheeses, and dairy products, appropriate calorie intake Will follow up in 3 months  Vitamin D Def At goal at last visit; continue supplementation to maintain goal of 70-100 Defer Vit D level  Prostate cancer (HCC) Progressed, gleason 8, Dr. Tresa Moore now managing Has started q29m Eligard, has radiation markers and will be starting external radiation x 40 rounds  Cough/GERD Improved on allegra, GERD well controlled by protonix and famotidine. Suggested he try protonix every other day.  Discussed diet, avoiding triggers and other lifestyle changes  Continue diet and meds as discussed. Further disposition pending results of labs. Discussed med's effects and SE's.   Over 30 minutes of exam, counseling, chart review, and critical decision making was performed.   Future Appointments  Date Time Provider [place]  06/30/2019 10:30 AM Tyler Pita, MD [facility] None  06/30/2019  7:00 PM [facility] 1 [facility] [place]  10/02/2019 10:00 AM Liane Comber, NP [facility] None  01/29/2020 10:00 AM Unk Pinto, MD [facility] None     ----------------------------------------------------------------------------------------------------------------------  HPI 86 y.o. male  presents for 3 month follow up on hypertension, cholesterol, glucose management, weight and vitamin D deficiency.   Patient has hx/o Prostate Cancer (2014 ) followed on Finasteride, has been doing active surveillance per Dr Junious Silk, last year PSA jumped up to 9.88, was referred to Dr. Tresa Moore and underwent MRI on 12/02/2018 which found gleason 8 cancer, underwent biopsy on 03/08/2019. PET scan 03/29/2019 showed no mets. He was initiated on Eligard in Feb 2021, had prostate markers placed 4/6 and will be initiated external radiation, planning 40 treatments. He reports 1 week of night sweats with initiation of eligard, but this resolved with addition of flax seed oil supplement.   he has a diagnosis of GERD which is currently managed by protonix 40 mg AM, famotidine 20 mg PM.  he reports symptoms is currently well controlled, and denies breakthrough reflux, burning in chest, hoarseness or cough.    BMI is Body mass index is 23.24 kg/m., he has been working on diet and exercise, he is walking daily, does AM stretches.  Wt Readings from Last 3 Encounters:  06/28/19 148 lb 6.4 oz (67.3 kg)  04/11/19 140 lb (63.5 kg)  12/28/18 146 lb 6.4 oz (66.4 kg)   His blood pressure has been controlled at home, presents with log demonstrating 135-150s/70-80s, today their BP is BP: (!) 156/82  He does workout. He denies chest pain, shortness of breath, dizziness.   He is not on cholesterol medication other than omega 3. His cholesterol is not at goal, mild elevations only. The cholesterol last visit was:   Lab Results  Component Value Date   CHOL 171 12/28/2018  HDL 36 (L) 12/28/2018   LDLCALC 103 (H) 12/28/2018   TRIG 196 (H) 12/28/2018   CHOLHDL 4.8 12/28/2018    He has been working on diet and exercise for glucose management, and denies increased appetite, nausea,  paresthesia of the feet, polydipsia, polyuria, visual disturbances and vomiting. Last A1C in the office was:  Lab Results  Component Value Date   HGBA1C 5.5 12/28/2018   Patient is on Vitamin D supplement and at goal:    Lab Results  Component Value Date   VD25OH 81 12/28/2018        Current Medications:  Current Outpatient Medications on File Prior to Visit  Medication Sig  . acetaminophen (TYLENOL) 325 MG tablet Take 650 mg by mouth every 6 (six) hours as needed (prn).  Marland Kitchen aspirin 81 MG EC tablet Take by mouth.  . bisoprolol-hydrochlorothiazide (ZIAC) 10-6.25 MG tablet Take 1 tablet Daily for BP  . Calcium Carbonate (CALCIUM 600 PO) Take by mouth daily.  . Cholecalciferol (VITAMIN D PO) Take 2,000 Int'l Units by mouth 2 (two) times daily.   . famotidine (PEPCID) 20 MG tablet Take 1 tablet 2 x /day as needed with meals for Acid Indigestion  . finasteride (PROSCAR) 5 MG tablet Take 1 tablet Daily for Prostate  . Flaxseed, Linseed, (FLAX SEED OIL PO) Take 1,200 mg by mouth 3 (three) times daily.   . Magnesium 250 MG TABS Take 250 mg by mouth daily.  . Omega-3 Fatty Acids (FISH OIL PO) Take 1,200 mg by mouth 2 (two) times daily.   . pantoprazole (PROTONIX) 40 MG tablet Take 1 tablet every Morning for Indigestion & Heartburn   No current facility-administered medications on file prior to visit.     Allergies:  Allergies  Allergen Reactions  . Pravastatin   . Red Yeast Rice [Cholestin]   . Sulfa Antibiotics   . Vibramycin [Doxycycline Calcium]   . Zetia [Ezetimibe]      Medical History:  Past Medical History:  Diagnosis Date  . Essential hypertension 02/15/2007  . Hyperlipidemia 07/13/2013  . Prediabetes 07/13/2013  . Prostate cancer ([place])   . Pulmonary nodule (2008 resolved on f/u)  02/15/2007  . Vitamin D deficiency 07/13/2013   Family history- Reviewed and unchanged Social history- Reviewed and unchanged   Review of Systems:  Review of Systems  Constitutional:  Negative for malaise/fatigue and weight loss.  HENT: Negative for hearing loss and tinnitus.   Eyes: Negative for blurred vision and double vision.  Respiratory: Negative for cough, shortness of breath and wheezing.   Cardiovascular: Negative for chest pain, palpitations, orthopnea, claudication and leg swelling.  Gastrointestinal: Negative for abdominal pain, blood in stool, constipation, diarrhea, heartburn, melena, nausea and vomiting.  Genitourinary: Negative.   Musculoskeletal: Negative for joint pain and myalgias.  Skin: Negative for rash.  Neurological: Negative for dizziness, tingling, sensory change, weakness and headaches.  Endo/Heme/Allergies: Negative for polydipsia.  Psychiatric/Behavioral: Negative.   All other systems reviewed and are negative.   Physical Exam: BP (!) 156/82   Pulse 73   Temp (!) 97.5 F (36.4 C)   Wt 148 lb 6.4 oz (67.3 kg)   SpO2 99%   BMI 23.24 kg/m  Wt Readings from Last 3 Encounters:  06/28/19 148 lb 6.4 oz (67.3 kg)  04/11/19 140 lb (63.5 kg)  12/28/18 146 lb 6.4 oz (66.4 kg)   General Appearance: Well nourished, in no apparent distress. Eyes: PERRLA, EOMs, conjunctiva no swelling or erythema Sinuses: No Frontal/maxillary tenderness ENT/Mouth: Ext  aud canals clear, TMs without erythema, bulging. No erythema, swelling, or exudate on post pharynx.  Tonsils not swollen or erythematous. Hearing normal.  Neck: Supple, thyroid normal.  Respiratory: Respiratory effort normal, BS equal bilaterally without rales, rhonchi, wheezing or stridor.  Cardio: RRR with no MRGs. Brisk peripheral pulses without edema.  Abdomen: Soft, + BS.  Non tender, no guarding, rebound, hernias, masses. Lymphatics: Non tender without lymphadenopathy.  Musculoskeletal: Full ROM, 5/5 strength, Normal gait Skin: Warm, dry without rashes, lesions, ecchymosis.  Neuro: Cranial nerves intact. No cerebellar symptoms.  Psych: Awake and oriented X 3, normal affect, Insight and  Judgment appropriate.    Izora Ribas, NP 9:38 AM [facility]

## 2019-06-23 ENCOUNTER — Ambulatory Visit: Payer: Medicare HMO | Admitting: Radiation Oncology

## 2019-06-28 ENCOUNTER — Telehealth: Payer: Self-pay | Admitting: *Deleted

## 2019-06-28 ENCOUNTER — Ambulatory Visit (INDEPENDENT_AMBULATORY_CARE_PROVIDER_SITE_OTHER): Payer: Medicare HMO | Admitting: Adult Health

## 2019-06-28 ENCOUNTER — Encounter: Payer: Self-pay | Admitting: Adult Health

## 2019-06-28 ENCOUNTER — Other Ambulatory Visit: Payer: Self-pay

## 2019-06-28 VITALS — BP 156/82 | HR 73 | Temp 97.5°F | Wt 148.4 lb

## 2019-06-28 DIAGNOSIS — Z79899 Other long term (current) drug therapy: Secondary | ICD-10-CM | POA: Diagnosis not present

## 2019-06-28 DIAGNOSIS — E782 Mixed hyperlipidemia: Secondary | ICD-10-CM

## 2019-06-28 DIAGNOSIS — E559 Vitamin D deficiency, unspecified: Secondary | ICD-10-CM | POA: Diagnosis not present

## 2019-06-28 DIAGNOSIS — Z6823 Body mass index (BMI) 23.0-23.9, adult: Secondary | ICD-10-CM | POA: Diagnosis not present

## 2019-06-28 DIAGNOSIS — K219 Gastro-esophageal reflux disease without esophagitis: Secondary | ICD-10-CM

## 2019-06-28 DIAGNOSIS — I10 Essential (primary) hypertension: Secondary | ICD-10-CM

## 2019-06-28 DIAGNOSIS — R7309 Other abnormal glucose: Secondary | ICD-10-CM

## 2019-06-28 DIAGNOSIS — C61 Malignant neoplasm of prostate: Secondary | ICD-10-CM | POA: Diagnosis not present

## 2019-06-28 MED ORDER — LOSARTAN POTASSIUM 50 MG PO TABS: ORAL_TABLET | ORAL | 0 refills | Status: DC

## 2019-06-28 NOTE — Patient Instructions (Addendum)
Goals    . Blood Pressure < 130/80    . Exercise 150 min/wk Moderate Activity    . LDL CALC < 100       Try protonix every other day - might do well alternating protonix/pantoprazole and famotidine every other day. Resume daily protonix if any breakthrough reflux, hoarseness, cough.   Start new blood pressure medication - losartan 1/2 tab daily at night Check blood pressure daily - if still tending to run above 130/80 in 2 weeks, increase to taking whole tab daily   Cut back or hold if any low blood pressure, dizziness, new fatigue     Losartan Tablets What is this medicine? LOSARTAN (loe SAR tan) is an angiotensin II receptor blocker, also known as an ARB. It treats high blood pressure. It can slow kidney damage in some patients. It may also be used to lower the risk of stroke. This medicine may be used for other purposes; ask your health care provider or pharmacist if you have questions. COMMON BRAND NAME(S): Cozaar What should I tell my health care provider before I take this medicine? They need to know if you have any of these conditions:  heart failure  kidney or liver disease  an unusual or allergic reaction to losartan, other medicines, foods, dyes, or preservatives  pregnant or trying to get pregnant  breast-feeding How should I use this medicine? Take this drug by mouth. Take it as directed on the prescription label at the same time every day. You can take it with or without food. If it upsets your stomach, take it with food. Keep taking it unless your health care provider tells you to stop. Talk to your health care provider about the use of this drug in children. While it may be prescribed for children as young as 6 for selected conditions, precautions do apply. Overdosage: If you think you have taken too much of this medicine contact a poison control center or emergency room at once. NOTE: This medicine is only for you. Do not share this medicine with others. What if  I miss a dose? If you miss a dose, take it as soon as you can. If it is almost time for your next dose, take only that dose. Do not take double or extra doses. What may interact with this medicine?  blood pressure medicines  diuretics, especially triamterene, spironolactone, or amiloride  fluconazole  NSAIDs, medicines for pain and inflammation, like ibuprofen or naproxen  potassium salts or potassium supplements  rifampin This list may not describe all possible interactions. Give your health care provider a list of all the medicines, herbs, non-prescription drugs, or dietary supplements you use. Also tell them if you smoke, drink alcohol, or use illegal drugs. Some items may interact with your medicine. What should I watch for while using this medicine? Visit your doctor or health care professional for regular checks on your progress. Check your blood pressure as directed. Ask your doctor or health care professional what your blood pressure should be and when you should contact him or her. Call your doctor or health care professional if you notice an irregular or fast heart beat. Women should inform their doctor if they wish to become pregnant or think they might be pregnant. There is a potential for serious side effects to an unborn child, particularly in the second or third trimester. Talk to your health care professional or pharmacist for more information. You may get drowsy or dizzy. Do not drive, use machinery,  or do anything that needs mental alertness until you know how this drug affects you. Do not stand or sit up quickly, especially if you are an older patient. This reduces the risk of dizzy or fainting spells. Alcohol can make you more drowsy and dizzy. Avoid alcoholic drinks. Avoid salt substitutes unless you are told otherwise by your doctor or health care professional. Do not treat yourself for coughs, colds, or pain while you are taking this medicine without asking your doctor or  health care professional for advice. Some ingredients may increase your blood pressure. What side effects may I notice from receiving this medicine? Side effects that you should report to your doctor or health care professional as soon as possible:  confusion, dizziness, light headedness or fainting spells  decreased amount of urine passed  difficulty breathing or swallowing, hoarseness, or tightening of the throat  fast or irregular heart beat, palpitations, or chest pain  skin rash, itching  swelling of your face, lips, tongue, hands, or feet Side effects that usually do not require medical attention (report to your doctor or health care professional if they continue or are bothersome):  cough  decreased sexual function or desire  headache  nasal congestion or stuffiness  nausea or stomach pain  sore or cramping muscles This list may not describe all possible side effects. Call your doctor for medical advice about side effects. You may report side effects to FDA at 1-800-FDA-1088. Where should I keep my medicine? Keep out of the reach of children and pets. Store at room temperature between 15 and 30 degrees C (59 and 86 degrees F). Protect from light. Keep the container tightly closed. Throw away any unused drug after the expiration date. NOTE: This sheet is a summary. It may not cover all possible information. If you have questions about this medicine, talk to your doctor, pharmacist, or health care provider.  2020 Elsevier/Gold Standard (2018-10-05 12:12:28)

## 2019-06-28 NOTE — Telephone Encounter (Signed)
CALLED PATIENT TO INFORM THAT IT IS OK TO BRING HIS WIFE FOR HIS APPTS. ON Friday April 16, SPOKE WITH PATIENT AND HE IS AWARE OF THIS

## 2019-06-29 ENCOUNTER — Telehealth: Payer: Self-pay | Admitting: *Deleted

## 2019-06-29 LAB — TSH: TSH: 0.73 mIU/L (ref 0.40–4.50)

## 2019-06-29 LAB — COMPLETE METABOLIC PANEL WITH GFR
AG Ratio: 1.8 (calc) (ref 1.0–2.5)
ALT: 26 U/L (ref 9–46)
AST: 21 U/L (ref 10–35)
Albumin: 4.3 g/dL (ref 3.6–5.1)
Alkaline phosphatase (APISO): 61 U/L (ref 35–144)
BUN: 18 mg/dL (ref 7–25)
CO2: 29 mmol/L (ref 20–32)
Calcium: 10 mg/dL (ref 8.6–10.3)
Chloride: 103 mmol/L (ref 98–110)
Creat: 1.09 mg/dL (ref 0.70–1.11)
GFR, Est African American: 72 mL/min/{1.73_m2} (ref >=60)
GFR, Est Non African American: 62 mL/min/{1.73_m2} (ref >=60)
Globulin: 2.4 g/dL (calc) (ref 1.9–3.7)
Glucose, Bld: 105 mg/dL — ABNORMAL HIGH (ref 65–99)
Potassium: 4.5 mmol/L (ref 3.5–5.3)
Sodium: 141 mmol/L (ref 135–146)
Total Bilirubin: 0.9 mg/dL (ref 0.2–1.2)
Total Protein: 6.7 g/dL (ref 6.1–8.1)

## 2019-06-29 LAB — CBC WITH DIFFERENTIAL/PLATELET
Absolute Monocytes: 561 cells/uL (ref 200–950)
Basophils Absolute: 28 cells/uL (ref 0–200)
Basophils Relative: 0.5 %
Eosinophils Absolute: 28 cells/uL (ref 15–500)
Eosinophils Relative: 0.5 %
HCT: 48 % (ref 38.5–50.0)
Hemoglobin: 16.1 g/dL (ref 13.2–17.1)
Lymphs Abs: 1293 cells/uL (ref 850–3900)
MCH: 30.8 pg (ref 27.0–33.0)
MCHC: 33.5 g/dL (ref 32.0–36.0)
MCV: 91.8 fL (ref 80.0–100.0)
MPV: 11.8 fL (ref 7.5–12.5)
Monocytes Relative: 10.2 %
Neutro Abs: 3592 cells/uL (ref 1500–7800)
Neutrophils Relative %: 65.3 %
Platelets: 198 10*3/uL (ref 140–400)
RBC: 5.23 10*6/uL (ref 4.20–5.80)
RDW: 12.7 % (ref 11.0–15.0)
Total Lymphocyte: 23.5 %
WBC: 5.5 10*3/uL (ref 3.8–10.8)

## 2019-06-29 LAB — LIPID PANEL
Cholesterol: 168 mg/dL (ref <200)
HDL: 37 mg/dL — ABNORMAL LOW (ref >=40)
LDL Cholesterol (Calc): 102 mg/dL (calc) — ABNORMAL HIGH
Non-HDL Cholesterol (Calc): 131 mg/dL (calc) — ABNORMAL HIGH (ref <130)
Total CHOL/HDL Ratio: 4.5 (calc) (ref <5.0)
Triglycerides: 174 mg/dL — ABNORMAL HIGH (ref <150)

## 2019-06-29 LAB — MAGNESIUM: Magnesium: 2.2 mg/dL (ref 1.5–2.5)

## 2019-06-29 NOTE — Telephone Encounter (Signed)
CALLED PATIENT TO REMIND OF SIM AND MRI APPT. FOR 06-30-19, SPOKE WITH PATIENT AND HE IS AWARE OF THESE APPTS.

## 2019-06-30 ENCOUNTER — Ambulatory Visit
Admission: RE | Admit: 2019-06-30 | Discharge: 2019-06-30 | Disposition: A | Discharge status: Home / Self Care | Payer: Medicare HMO | Source: Ambulatory Visit | Attending: Radiation Oncology | Admitting: Radiation Oncology

## 2019-06-30 ENCOUNTER — Other Ambulatory Visit: Payer: Self-pay

## 2019-06-30 ENCOUNTER — Encounter: Payer: Self-pay | Admitting: Medical Oncology

## 2019-06-30 ENCOUNTER — Ambulatory Visit (HOSPITAL_COMMUNITY)
Admission: RE | Admit: 2019-06-30 | Discharge: 2019-06-30 | Disposition: A | Discharge status: Home / Self Care | Payer: Medicare HMO | Source: Ambulatory Visit | Attending: Urology | Admitting: Urology

## 2019-06-30 DIAGNOSIS — C61 Malignant neoplasm of prostate: Secondary | ICD-10-CM | POA: Insufficient documentation

## 2019-06-30 DIAGNOSIS — Z510 Encounter for antineoplastic radiation therapy: Secondary | ICD-10-CM | POA: Diagnosis not present

## 2019-06-30 NOTE — Progress Notes (Signed)
  Radiation Oncology         [phone] ________________________________  Name: Matthew Aguirre MRN: [id number]  Date: 06/30/2019  DOB: 1933/06/07  SIMULATION AND TREATMENT PLANNING NOTE    ICD-10-CM   1. Malignant neoplasm of prostate ([place])  C61     DIAGNOSIS:  86 y.o. 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  NARRATIVE:  The patient was brought to the [facility].  Identity was confirmed.  All relevant records and images related to the planned course of therapy were reviewed.  The patient freely provided informed written consent to proceed with treatment after reviewing the details related to the planned course of therapy. The consent form was witnessed and verified by the simulation staff.  Then, the patient was set-up in a stable reproducible supine position for radiation therapy.  A vacuum lock pillow device was custom fabricated to position his legs in a reproducible immobilized position.  Then, I performed a urethrogram under sterile conditions to identify the prostatic bed.  CT images were obtained.  Surface markings were placed.  The CT images were loaded into the planning software.  Then the prostate bed target, pelvic lymph node target and avoidance structures including the rectum, bladder, bowel and hips were contoured.  Treatment planning then occurred.  The radiation prescription was entered and confirmed.  A total of one complex treatment devices were fabricated. I have requested : Intensity Modulated Radiotherapy (IMRT) is medically necessary for this case for the following reason:  Rectal sparing.Marland Kitchen  PLAN:  The patient will receive 45 Gy in 25 fractions of 1.8 Gy, followed by a boost to the prostate to a total dose of 75 Gy with 15 additional fractions of 2 Gy.   ________________________________  Sheral Apley Tammi Klippel, M.D.

## 2019-07-03 ENCOUNTER — Encounter: Payer: Self-pay | Admitting: Medical Oncology

## 2019-07-03 NOTE — Progress Notes (Signed)
Spoke with patient regarding appointments on his calendar 5/14. I explained the 9:15 appointment is with the PA at his primary care MD and the 1:45 pm is for radiation. He had forgotten he had a follow up with primary MD and thought is was related to his cancer treatment. He asked if the time for radiation, was actual treatment time or arrival. I explained it is treatment time and asked him to arrive 15 minutes for check in. He voiced understanding and was very appreciative of return call. I asked him to call me with questions or concerns.

## 2019-07-10 DIAGNOSIS — C61 Malignant neoplasm of prostate: Secondary | ICD-10-CM | POA: Diagnosis not present

## 2019-07-10 DIAGNOSIS — Z510 Encounter for antineoplastic radiation therapy: Secondary | ICD-10-CM | POA: Diagnosis not present

## 2019-07-11 ENCOUNTER — Encounter: Payer: Self-pay | Admitting: Medical Oncology

## 2019-07-11 ENCOUNTER — Other Ambulatory Visit: Payer: Self-pay

## 2019-07-11 ENCOUNTER — Ambulatory Visit
Admission: RE | Admit: 2019-07-11 | Discharge: 2019-07-11 | Disposition: A | Discharge status: Home / Self Care | Payer: Medicare HMO | Source: Ambulatory Visit | Attending: Radiation Oncology | Admitting: Radiation Oncology

## 2019-07-11 DIAGNOSIS — Z510 Encounter for antineoplastic radiation therapy: Secondary | ICD-10-CM | POA: Diagnosis not present

## 2019-07-11 DIAGNOSIS — C61 Malignant neoplasm of prostate: Secondary | ICD-10-CM | POA: Diagnosis not present

## 2019-07-12 ENCOUNTER — Ambulatory Visit
Admission: RE | Admit: 2019-07-12 | Discharge: 2019-07-12 | Disposition: A | Discharge status: Home / Self Care | Payer: Medicare HMO | Source: Ambulatory Visit | Attending: Radiation Oncology | Admitting: Radiation Oncology

## 2019-07-12 ENCOUNTER — Other Ambulatory Visit: Payer: Self-pay

## 2019-07-12 DIAGNOSIS — Z510 Encounter for antineoplastic radiation therapy: Secondary | ICD-10-CM | POA: Diagnosis not present

## 2019-07-12 DIAGNOSIS — C61 Malignant neoplasm of prostate: Secondary | ICD-10-CM | POA: Diagnosis not present

## 2019-07-13 ENCOUNTER — Ambulatory Visit
Admission: RE | Admit: 2019-07-13 | Discharge: 2019-07-13 | Disposition: A | Discharge status: Home / Self Care | Payer: Medicare HMO | Source: Ambulatory Visit | Attending: Radiation Oncology | Admitting: Radiation Oncology

## 2019-07-13 ENCOUNTER — Other Ambulatory Visit: Payer: Self-pay

## 2019-07-13 DIAGNOSIS — Z510 Encounter for antineoplastic radiation therapy: Secondary | ICD-10-CM | POA: Diagnosis not present

## 2019-07-13 DIAGNOSIS — C61 Malignant neoplasm of prostate: Secondary | ICD-10-CM | POA: Diagnosis not present

## 2019-07-14 ENCOUNTER — Other Ambulatory Visit: Payer: Self-pay

## 2019-07-14 ENCOUNTER — Ambulatory Visit
Admission: RE | Admit: 2019-07-14 | Discharge: 2019-07-14 | Disposition: A | Discharge status: Home / Self Care | Payer: Medicare HMO | Source: Ambulatory Visit | Attending: Radiation Oncology | Admitting: Radiation Oncology

## 2019-07-14 DIAGNOSIS — C61 Malignant neoplasm of prostate: Secondary | ICD-10-CM | POA: Diagnosis not present

## 2019-07-14 DIAGNOSIS — Z510 Encounter for antineoplastic radiation therapy: Secondary | ICD-10-CM | POA: Diagnosis not present

## 2019-07-17 ENCOUNTER — Other Ambulatory Visit: Payer: Self-pay

## 2019-07-17 ENCOUNTER — Ambulatory Visit
Admission: RE | Admit: 2019-07-17 | Discharge: 2019-07-17 | Disposition: A | Discharge status: Home / Self Care | Payer: Medicare HMO | Source: Ambulatory Visit | Attending: Radiation Oncology | Admitting: Radiation Oncology

## 2019-07-17 DIAGNOSIS — C61 Malignant neoplasm of prostate: Secondary | ICD-10-CM | POA: Insufficient documentation

## 2019-07-17 DIAGNOSIS — Z510 Encounter for antineoplastic radiation therapy: Secondary | ICD-10-CM | POA: Diagnosis not present

## 2019-07-18 ENCOUNTER — Ambulatory Visit
Admission: RE | Admit: 2019-07-18 | Discharge: 2019-07-18 | Disposition: A | Discharge status: Home / Self Care | Payer: Medicare HMO | Source: Ambulatory Visit | Attending: Radiation Oncology | Admitting: Radiation Oncology

## 2019-07-18 ENCOUNTER — Other Ambulatory Visit: Payer: Self-pay

## 2019-07-18 DIAGNOSIS — Z510 Encounter for antineoplastic radiation therapy: Secondary | ICD-10-CM | POA: Diagnosis not present

## 2019-07-18 DIAGNOSIS — C61 Malignant neoplasm of prostate: Secondary | ICD-10-CM | POA: Diagnosis not present

## 2019-07-19 ENCOUNTER — Other Ambulatory Visit: Payer: Self-pay

## 2019-07-19 ENCOUNTER — Ambulatory Visit
Admission: RE | Admit: 2019-07-19 | Discharge: 2019-07-19 | Disposition: A | Discharge status: Home / Self Care | Payer: Medicare HMO | Source: Ambulatory Visit | Attending: Radiation Oncology | Admitting: Radiation Oncology

## 2019-07-19 DIAGNOSIS — Z510 Encounter for antineoplastic radiation therapy: Secondary | ICD-10-CM | POA: Diagnosis not present

## 2019-07-19 DIAGNOSIS — C61 Malignant neoplasm of prostate: Secondary | ICD-10-CM | POA: Diagnosis not present

## 2019-07-20 ENCOUNTER — Other Ambulatory Visit: Payer: Self-pay

## 2019-07-20 ENCOUNTER — Ambulatory Visit
Admission: RE | Admit: 2019-07-20 | Discharge: 2019-07-20 | Disposition: A | Discharge status: Home / Self Care | Payer: Medicare HMO | Source: Ambulatory Visit | Attending: Radiation Oncology | Admitting: Radiation Oncology

## 2019-07-20 DIAGNOSIS — Z510 Encounter for antineoplastic radiation therapy: Secondary | ICD-10-CM | POA: Diagnosis not present

## 2019-07-20 DIAGNOSIS — C61 Malignant neoplasm of prostate: Secondary | ICD-10-CM | POA: Diagnosis not present

## 2019-07-21 ENCOUNTER — Ambulatory Visit
Admission: RE | Admit: 2019-07-21 | Discharge: 2019-07-21 | Disposition: A | Discharge status: Home / Self Care | Payer: Medicare HMO | Source: Ambulatory Visit | Attending: Radiation Oncology | Admitting: Radiation Oncology

## 2019-07-21 DIAGNOSIS — Z510 Encounter for antineoplastic radiation therapy: Secondary | ICD-10-CM | POA: Diagnosis not present

## 2019-07-21 DIAGNOSIS — C61 Malignant neoplasm of prostate: Secondary | ICD-10-CM | POA: Diagnosis not present

## 2019-07-24 ENCOUNTER — Ambulatory Visit
Admission: RE | Admit: 2019-07-24 | Discharge: 2019-07-24 | Disposition: A | Discharge status: Home / Self Care | Payer: Medicare HMO | Source: Ambulatory Visit | Attending: Radiation Oncology | Admitting: Radiation Oncology

## 2019-07-24 ENCOUNTER — Encounter: Payer: Self-pay | Admitting: Radiation Oncology

## 2019-07-24 ENCOUNTER — Other Ambulatory Visit: Payer: Self-pay

## 2019-07-24 DIAGNOSIS — C61 Malignant neoplasm of prostate: Secondary | ICD-10-CM | POA: Diagnosis not present

## 2019-07-24 DIAGNOSIS — Z510 Encounter for antineoplastic radiation therapy: Secondary | ICD-10-CM | POA: Diagnosis not present

## 2019-07-24 NOTE — Progress Notes (Signed)
  Radiation Oncology         [phone] ________________________________  Name: Matthew Aguirre MRN: [id number]  Date: 07/24/2019  DOB: [birth year]  VIRTUAL SIMULATION NOTE  NARRATIVE:  The patient underwent simulation today for ongoing radiation therapy.  The existing CT study set was employed for the purpose of virtual treatment planning.  The target and avoidance structures were reviewed and in some cases modified based. I have requested : Isodose Plan and dose calculations  PLAN:  This modified radiation beam arrangement is intended to continue the current radiation dose to an additional 30 Gy in 15 fractions for a total cumulative dose of 2 Gy.  ------------------------------------------------  Sheral Apley. Tammi Klippel, M.D.

## 2019-07-25 ENCOUNTER — Ambulatory Visit
Admission: RE | Admit: 2019-07-25 | Discharge: 2019-07-25 | Disposition: A | Discharge status: Home / Self Care | Payer: Medicare HMO | Source: Ambulatory Visit | Attending: Radiation Oncology | Admitting: Radiation Oncology

## 2019-07-25 ENCOUNTER — Other Ambulatory Visit: Payer: Self-pay

## 2019-07-25 DIAGNOSIS — Z510 Encounter for antineoplastic radiation therapy: Secondary | ICD-10-CM | POA: Diagnosis not present

## 2019-07-25 DIAGNOSIS — C61 Malignant neoplasm of prostate: Secondary | ICD-10-CM | POA: Diagnosis not present

## 2019-07-26 ENCOUNTER — Ambulatory Visit
Admission: RE | Admit: 2019-07-26 | Discharge: 2019-07-26 | Disposition: A | Discharge status: Home / Self Care | Payer: Medicare HMO | Source: Ambulatory Visit | Attending: Radiation Oncology | Admitting: Radiation Oncology

## 2019-07-26 ENCOUNTER — Other Ambulatory Visit: Payer: Self-pay

## 2019-07-26 DIAGNOSIS — Z510 Encounter for antineoplastic radiation therapy: Secondary | ICD-10-CM | POA: Diagnosis not present

## 2019-07-26 DIAGNOSIS — C61 Malignant neoplasm of prostate: Secondary | ICD-10-CM | POA: Diagnosis not present

## 2019-07-27 ENCOUNTER — Other Ambulatory Visit: Payer: Self-pay

## 2019-07-27 ENCOUNTER — Ambulatory Visit
Admission: RE | Admit: 2019-07-27 | Discharge: 2019-07-27 | Disposition: A | Discharge status: Home / Self Care | Payer: Medicare HMO | Source: Ambulatory Visit | Attending: Radiation Oncology | Admitting: Radiation Oncology

## 2019-07-27 DIAGNOSIS — C61 Malignant neoplasm of prostate: Secondary | ICD-10-CM | POA: Diagnosis not present

## 2019-07-27 DIAGNOSIS — Z510 Encounter for antineoplastic radiation therapy: Secondary | ICD-10-CM | POA: Diagnosis not present

## 2019-07-27 NOTE — Progress Notes (Signed)
Assessment and Plan:  [place] was seen today for follow-up.  Diagnoses and all orders for this visit:  Essential hypertension Continue medications; hold losartan if BP <110/70 Monitor blood pressure at home; call if consistently over 130/80 Continue DASH diet.   Reminder to go to the ER if any CP, SOB, nausea, dizziness, severe HA, changes vision/speech, left arm numbness and tingling and jaw pain. -     BASIC METABOLIC PANEL WITH GFR -     Magnesium -     losartan (COZAAR) 25 MG tablet; Take 1 tab daily in the evening for blood pressure goal <130/80.   Further disposition pending results of labs. Discussed med's effects and SE's.   Over 15 minutes of exam, counseling, chart review, and critical decision making was performed.   Future Appointments  Date Time Provider [place]  07/28/2019  1:45 PM [facility] None  07/31/2019  1:45 PM [facility] 3 [facility] None  08/01/2019  1:45 PM [facility] LINAC 3 [facility] None  08/02/2019  1:45 PM [facility] LINAC 3 [facility] None  08/03/2019  1:45 PM [facility] LINAC 3 [facility] None  08/04/2019  1:45 PM [facility] LINAC 3 [facility] None  08/07/2019  1:45 PM [facility] LINAC 3 [facility] None  08/08/2019  1:45 PM [facility] LINAC 3 [facility] None  08/09/2019  1:45 PM [facility] LINAC 3 [facility] None  08/10/2019  1:45 PM [facility] LINAC 3 [facility] None  08/11/2019  1:45 PM [facility] LINAC 3 [facility] None  08/15/2019  1:45 PM [facility] LINAC 3 [facility] None  08/16/2019  1:45 PM [facility] LINAC 3 [facility] None  08/17/2019  1:45 PM [facility] LINAC 3 [facility] None  08/18/2019  1:45 PM [facility] LINAC 3 [facility] None  08/21/2019  1:45 PM [facility] LINAC 3 [facility] None  08/22/2019  1:45 PM [facility] LINAC 3 [facility] None  08/23/2019  1:45 PM [facility] LINAC 3 [facility] None  08/24/2019  1:45 PM [facility] LINAC 3 [facility] None  08/25/2019  1:45 PM [facility] LINAC  3 [facility] None  08/28/2019  1:45 PM [facility] LINAC 3 [facility] None  08/29/2019  1:45 PM [facility] LINAC 3 [facility] None  08/30/2019  1:45 PM [facility] LINAC 3 [facility] None  08/31/2019  1:45 PM [facility] LIN[facility] 3 [facility] None  09/01/2019  1:45 PM [facility] LINAC 3 [facility] None  09/04/2019  1:45 PM [facility] LIN[facility] 3 [facility] None  09/05/2019  1:45 PM [facility] LIN[facility] 3 [facility] None  10/02/2019 10:00 AM Nyeisha Goodall, Caryl Pina, NP [facility] None  01/29/2020 10:00 AM Unk Pinto, MD [facility] None    ------------------------------------------------------------------------------------------------------------------   HPI BP 130/76   Pulse 75   Temp 97.6 F (36.4 C)   Resp 16   Wt 149 lb 9.6 oz (67.9 kg)   SpO2 96%   BMI 23.43 kg/m   86 y.o.male with benign cardiovascular history presents for 1 month follow up on hypertension.   Recently above goal; continue ziac, add losartan 25 mg daily x 2 weeks, increase to 50 mg if needed for goal. Follow up in 1 month.   He presents with BP log demonstrating values 120s/70s for the past week. Denies any SE, reports had 1 night where BP was 108/64 prior to taking medication, skipped taking that night.   His blood pressure has been controlled at home, today their BP is BP: 130/76  He does workout. He denies chest pain, shortness of breath, dizziness.   Past Medical History:  Diagnosis Date  . Essential hypertension 02/15/2007  . Hyperlipidemia 07/13/2013  . Prediabetes  07/13/2013  . Prostate cancer ([place])   . Pulmonary nodule (2008 resolved on f/u)  02/15/2007  . Vitamin D deficiency 07/13/2013     Allergies  Allergen Reactions  . Pravastatin   . Red Yeast Rice [Cholestin]   . Sulfa Antibiotics   . Vibramycin [Doxycycline Calcium]   . Zetia [Ezetimibe]     Current Outpatient Medications on File Prior to Visit  Medication Sig  . acetaminophen (TYLENOL) 325 MG tablet Take 650 mg by mouth every 6 (six)  hours as needed (prn).  Marland Kitchen aspirin 81 MG EC tablet Take by mouth.  . bisoprolol-hydrochlorothiazide (ZIAC) 10-6.25 MG tablet Take 1 tablet Daily for BP  . Calcium Carbonate (CALCIUM 600 PO) Take by mouth daily.  . Cholecalciferol (VITAMIN D PO) Take 2,000 Int'l Units by mouth 2 (two) times daily.   . famotidine (PEPCID) 20 MG tablet Take 1 tablet 2 x /day as needed with meals for Acid Indigestion  . finasteride (PROSCAR) 5 MG tablet Take 1 tablet Daily for Prostate  . Flaxseed, Linseed, (FLAX SEED OIL PO) Take 1,200 mg by mouth 3 (three) times daily.   Marland Kitchen losartan (COZAAR) 50 MG tablet Start taking 1/2 tab daily in the evening for blood pressure goal <130/80. If remains above goal in 2 weeks increase to taking a whole tab.  . Magnesium 250 MG TABS Take 250 mg by mouth daily.  . Omega-3 Fatty Acids (FISH OIL PO) Take 1,200 mg by mouth 2 (two) times daily.   . pantoprazole (PROTONIX) 40 MG tablet Take 1 tablet every Morning for Indigestion & Heartburn   No current facility-administered medications on file prior to visit.    ROS: all negative except above.   Physical Exam:  BP 130/76   Pulse 75   Temp 97.6 F (36.4 C)   Resp 16   Wt 149 lb 9.6 oz (67.9 kg)   SpO2 96%   BMI 23.43 kg/m   General Appearance: Well nourished, in no apparent distress. Eyes: PERRLA, EOMs, conjunctiva no swelling or erythema Sinuses: No Frontal/maxillary tenderness ENT/Mouth: Ext aud canals clear, TMs without erythema, bulging. No erythema, swelling, or exudate on post pharynx.  Tonsils not swollen or erythematous. Hearing normal.  Neck: Supple, thyroid normal.  Respiratory: Respiratory effort normal, BS equal bilaterally without rales, rhonchi, wheezing or stridor.  Cardio: RRR with no MRGs. Brisk peripheral pulses without edema.  Abdomen: Soft, + BS.  Non tender, no guarding, rebound, hernias, masses. Lymphatics: Non tender without lymphadenopathy.  Musculoskeletal: Full ROM, 5/5 strength, normal gait.   Skin: Warm, dry without rashes, lesions, ecchymosis.  Neuro: Cranial nerves intact. Normal muscle tone, no cerebellar symptoms. Sensation intact.  Psych: Awake and oriented X 3, normal affect, Insight and Judgment appropriate.     Izora Ribas, NP 9:20 AM [facility]

## 2019-07-28 ENCOUNTER — Ambulatory Visit (INDEPENDENT_AMBULATORY_CARE_PROVIDER_SITE_OTHER): Payer: Medicare HMO | Admitting: Adult Health

## 2019-07-28 ENCOUNTER — Other Ambulatory Visit: Payer: Self-pay

## 2019-07-28 ENCOUNTER — Ambulatory Visit
Admission: RE | Admit: 2019-07-28 | Discharge: 2019-07-28 | Disposition: A | Discharge status: Home / Self Care | Payer: Medicare HMO | Source: Ambulatory Visit | Attending: Radiation Oncology | Admitting: Radiation Oncology

## 2019-07-28 ENCOUNTER — Encounter: Payer: Self-pay | Admitting: Adult Health

## 2019-07-28 VITALS — BP 130/76 | HR 75 | Temp 97.6°F | Resp 16 | Wt 149.6 lb

## 2019-07-28 DIAGNOSIS — I10 Essential (primary) hypertension: Secondary | ICD-10-CM | POA: Diagnosis not present

## 2019-07-28 DIAGNOSIS — K219 Gastro-esophageal reflux disease without esophagitis: Secondary | ICD-10-CM

## 2019-07-28 DIAGNOSIS — Z510 Encounter for antineoplastic radiation therapy: Secondary | ICD-10-CM | POA: Diagnosis not present

## 2019-07-28 DIAGNOSIS — C61 Malignant neoplasm of prostate: Secondary | ICD-10-CM | POA: Diagnosis not present

## 2019-07-28 MED ORDER — FAMOTIDINE 20 MG PO TABS: ORAL_TABLET | ORAL | 3 refills | Status: DC

## 2019-07-28 MED ORDER — LOSARTAN POTASSIUM 25 MG PO TABS: ORAL_TABLET | ORAL | 1 refills | Status: DC

## 2019-07-28 NOTE — Patient Instructions (Signed)
Goals    . Blood Pressure < 130/80    . Exercise 150 min/wk Moderate Activity    . LDL CALC < 100      Continue with current plan - sent in new dose of losartan (25 mg tabs) - to start taking instead of 1/2 tab of the 50 mg tabs  Continue to monitor - hold losartan if running low (<110/70)  Call with any concerns    HYPERTENSION INFORMATION  Monitor your blood pressure at home, please keep a record and bring that in with you to your next office visit.   Go to the ER if any CP, SOB, nausea, dizziness, severe HA, changes vision/speech  Testing/Procedures: HOW TO TAKE YOUR BLOOD PRESSURE:  Rest 5 minutes before taking your blood pressure.  Don't smoke or drink caffeinated beverages for at least 30 minutes before.  Take your blood pressure before (not after) you eat.  Sit comfortably with your back supported and both feet on the floor (don't cross your legs).  Elevate your arm to heart level on a table or a desk.  Use the proper sized cuff. It should fit smoothly and snugly around your bare upper arm. There should be enough room to slip a fingertip under the cuff. The bottom edge of the cuff should be 1 inch above the crease of the elbow.  Your most recent BP: BP: 130/76   Take your medications faithfully as instructed. Maintain a healthy weight. Get at least 150 minutes of aerobic exercise per week. Minimize salt intake. Minimize alcohol intake  DASH Eating Plan DASH stands for "Dietary Approaches to Stop Hypertension." The DASH eating plan is a healthy eating plan that has been shown to reduce high blood pressure (hypertension). Additional health benefits may include reducing the risk of type 2 diabetes mellitus, heart disease, and stroke. The DASH eating plan may also help with weight loss. WHAT DO I NEED TO KNOW ABOUT THE DASH EATING PLAN? For the DASH eating plan, you will follow these general guidelines:  Choose foods with a percent daily value for sodium of less  than 5% (as listed on the food label).  Use salt-free seasonings or herbs instead of table salt or sea salt.  Check with your health care provider or pharmacist before using salt substitutes.  Eat lower-sodium products, often labeled as "lower sodium" or "no salt added."  Eat fresh foods.  Eat more vegetables, fruits, and low-fat dairy products.  Choose whole grains. Look for the word "whole" as the first word in the ingredient list.  Choose fish and skinless chicken or [place] more often than red meat. Limit fish, poultry, and meat to 6 oz (170 g) each day.  Limit sweets, desserts, sugars, and sugary drinks.  Choose heart-healthy fats.  Limit cheese to 1 oz (28 g) per day.  Eat more home-cooked food and less restaurant, buffet, and fast food.  Limit fried foods.  Cook foods using methods other than frying.  Limit canned vegetables. If you do use them, rinse them well to decrease the sodium.  When eating at a restaurant, ask that your food be prepared with less salt, or no salt if possible. WHAT FOODS CAN I EAT? Seek help from a dietitian for individual calorie needs. Grains Whole grain or whole wheat bread. Brown rice. Whole grain or whole wheat pasta. Quinoa, bulgur, and whole grain cereals. Low-sodium cereals. Corn or whole wheat flour tortillas. Whole grain cornbread. Whole grain crackers. Low-sodium crackers. Vegetables Fresh or frozen vegetables (  raw, steamed, roasted, or grilled). Low-sodium or reduced-sodium tomato and vegetable juices. Low-sodium or reduced-sodium tomato sauce and paste. Low-sodium or reduced-sodium canned vegetables.  Fruits All fresh, canned (in natural juice), or frozen fruits. Meat and Other Protein Products Ground beef (85% or leaner), grass-fed beef, or beef trimmed of fat. Skinless chicken or [place]. Ground chicken or [place]. Pork trimmed of fat. All fish and seafood. Eggs. Dried beans, peas, or lentils. Unsalted nuts and seeds. Unsalted canned  beans. Dairy Low-fat dairy products, such as skim or 1% milk, 2% or reduced-fat cheeses, low-fat ricotta or cottage cheese, or plain low-fat yogurt. Low-sodium or reduced-sodium cheeses. Fats and Oils Tub margarines without trans fats. Light or reduced-fat mayonnaise and salad dressings (reduced sodium). Avocado. Safflower, olive, or canola oils. Natural peanut or almond butter. Other Unsalted popcorn and pretzels. The items listed above may not be a complete list of recommended foods or beverages. Contact your dietitian for more options. WHAT FOODS ARE NOT RECOMMENDED? Grains White bread. White pasta. White rice. Refined cornbread. Bagels and croissants. Crackers that contain trans fat. Vegetables Creamed or fried vegetables. Vegetables in a cheese sauce. Regular canned vegetables. Regular canned tomato sauce and paste. Regular tomato and vegetable juices. Fruits Dried fruits. Canned fruit in light or heavy syrup. Fruit juice. Meat and Other Protein Products Fatty cuts of meat. Ribs, chicken wings, bacon, sausage, bologna, salami, chitterlings, fatback, hot dogs, bratwurst, and packaged luncheon meats. Salted nuts and seeds. Canned beans with salt. Dairy Whole or 2% milk, cream, half-and-half, and cream cheese. Whole-fat or sweetened yogurt. Full-fat cheeses or blue cheese. Nondairy creamers and whipped toppings. Processed cheese, cheese spreads, or cheese curds. Condiments Onion and garlic salt, seasoned salt, table salt, and sea salt. Canned and packaged gravies. Worcestershire sauce. Tartar sauce. Barbecue sauce. Teriyaki sauce. Soy sauce, including reduced sodium. Steak sauce. Fish sauce. Oyster sauce. Cocktail sauce. Horseradish. Ketchup and mustard. Meat flavorings and tenderizers. Bouillon cubes. Hot sauce. Tabasco sauce. Marinades. Taco seasonings. Relishes. Fats and Oils Butter, stick margarine, lard, shortening, ghee, and bacon fat. Coconut, palm kernel, or palm oils. Regular salad  dressings. Other Pickles and olives. Salted popcorn and pretzels. The items listed above may not be a complete list of foods and beverages to avoid. Contact your dietitian for more information. WHERE CAN I FIND MORE INFORMATION? National Heart, Lung, and Blood Institute: travelstabloid.com Document Released: 02/19/2011 Document Revised: 07/17/2013 Document Reviewed: 01/04/2013 [facility] Patient Information 2015 [place], [place]. This information is not intended to replace advice given to you by your health care provider. Make sure you discuss any questions you have with your health care provider.

## 2019-07-29 LAB — BASIC METABOLIC PANEL WITH GFR
BUN: 19 mg/dL (ref 7–25)
CO2: 28 mmol/L (ref 20–32)
Calcium: 9.4 mg/dL (ref 8.6–10.3)
Chloride: 105 mmol/L (ref 98–110)
Creat: 0.97 mg/dL (ref 0.70–1.11)
GFR, Est African American: 83 mL/min/{1.73_m2} (ref >=60)
GFR, Est Non African American: 71 mL/min/{1.73_m2} (ref >=60)
Glucose, Bld: 101 mg/dL — ABNORMAL HIGH (ref 65–99)
Potassium: 4.1 mmol/L (ref 3.5–5.3)
Sodium: 140 mmol/L (ref 135–146)

## 2019-07-29 LAB — MAGNESIUM: Magnesium: 1.9 mg/dL (ref 1.5–2.5)

## 2019-07-31 ENCOUNTER — Other Ambulatory Visit: Payer: Self-pay

## 2019-07-31 ENCOUNTER — Ambulatory Visit
Admission: RE | Admit: 2019-07-31 | Discharge: 2019-07-31 | Disposition: A | Discharge status: Home / Self Care | Payer: Medicare HMO | Source: Ambulatory Visit | Attending: Radiation Oncology | Admitting: Radiation Oncology

## 2019-07-31 DIAGNOSIS — Z510 Encounter for antineoplastic radiation therapy: Secondary | ICD-10-CM | POA: Diagnosis not present

## 2019-07-31 DIAGNOSIS — C61 Malignant neoplasm of prostate: Secondary | ICD-10-CM | POA: Diagnosis not present

## 2019-08-01 ENCOUNTER — Ambulatory Visit
Admission: RE | Admit: 2019-08-01 | Discharge: 2019-08-01 | Disposition: A | Discharge status: Home / Self Care | Payer: Medicare HMO | Source: Ambulatory Visit | Attending: Radiation Oncology | Admitting: Radiation Oncology

## 2019-08-01 ENCOUNTER — Other Ambulatory Visit: Payer: Self-pay

## 2019-08-01 DIAGNOSIS — C61 Malignant neoplasm of prostate: Secondary | ICD-10-CM | POA: Diagnosis not present

## 2019-08-01 DIAGNOSIS — Z510 Encounter for antineoplastic radiation therapy: Secondary | ICD-10-CM | POA: Diagnosis not present

## 2019-08-02 ENCOUNTER — Ambulatory Visit
Admission: RE | Admit: 2019-08-02 | Discharge: 2019-08-02 | Disposition: A | Discharge status: Home / Self Care | Payer: Medicare HMO | Source: Ambulatory Visit | Attending: Radiation Oncology | Admitting: Radiation Oncology

## 2019-08-02 ENCOUNTER — Other Ambulatory Visit: Payer: Self-pay

## 2019-08-02 DIAGNOSIS — Z510 Encounter for antineoplastic radiation therapy: Secondary | ICD-10-CM | POA: Diagnosis not present

## 2019-08-02 DIAGNOSIS — C61 Malignant neoplasm of prostate: Secondary | ICD-10-CM | POA: Diagnosis not present

## 2019-08-03 ENCOUNTER — Ambulatory Visit
Admission: RE | Admit: 2019-08-03 | Discharge: 2019-08-03 | Disposition: A | Discharge status: Home / Self Care | Payer: Medicare HMO | Source: Ambulatory Visit | Attending: Radiation Oncology | Admitting: Radiation Oncology

## 2019-08-03 ENCOUNTER — Other Ambulatory Visit: Payer: Self-pay

## 2019-08-03 DIAGNOSIS — C61 Malignant neoplasm of prostate: Secondary | ICD-10-CM | POA: Diagnosis not present

## 2019-08-03 DIAGNOSIS — Z510 Encounter for antineoplastic radiation therapy: Secondary | ICD-10-CM | POA: Diagnosis not present

## 2019-08-04 ENCOUNTER — Ambulatory Visit
Admission: RE | Admit: 2019-08-04 | Discharge: 2019-08-04 | Disposition: A | Discharge status: Home / Self Care | Payer: Medicare HMO | Source: Ambulatory Visit | Attending: Radiation Oncology | Admitting: Radiation Oncology

## 2019-08-04 ENCOUNTER — Other Ambulatory Visit: Payer: Self-pay

## 2019-08-04 DIAGNOSIS — C61 Malignant neoplasm of prostate: Secondary | ICD-10-CM | POA: Diagnosis not present

## 2019-08-04 DIAGNOSIS — Z510 Encounter for antineoplastic radiation therapy: Secondary | ICD-10-CM | POA: Diagnosis not present

## 2019-08-07 ENCOUNTER — Telehealth: Payer: Self-pay

## 2019-08-07 ENCOUNTER — Ambulatory Visit
Admission: RE | Admit: 2019-08-07 | Discharge: 2019-08-07 | Disposition: A | Discharge status: Home / Self Care | Payer: Medicare HMO | Source: Ambulatory Visit | Attending: Radiation Oncology | Admitting: Radiation Oncology

## 2019-08-07 ENCOUNTER — Encounter: Payer: Self-pay | Admitting: Medical Oncology

## 2019-08-07 DIAGNOSIS — C61 Malignant neoplasm of prostate: Secondary | ICD-10-CM | POA: Diagnosis not present

## 2019-08-07 DIAGNOSIS — Z510 Encounter for antineoplastic radiation therapy: Secondary | ICD-10-CM | POA: Diagnosis not present

## 2019-08-07 NOTE — Telephone Encounter (Signed)
Patient advised and voiced understanding of instructions.

## 2019-08-07 NOTE — Telephone Encounter (Signed)
Patient states that his BP readings have been 113/60, 112/56 & 107/58. States that he was told to call if it dropped below 110/70. Hasn't taken BP meds last night and this morning and BP was 139/83. Please advise.

## 2019-08-08 ENCOUNTER — Ambulatory Visit
Admission: RE | Admit: 2019-08-08 | Discharge: 2019-08-08 | Disposition: A | Discharge status: Home / Self Care | Payer: Medicare HMO | Source: Ambulatory Visit | Attending: Radiation Oncology | Admitting: Radiation Oncology

## 2019-08-08 DIAGNOSIS — Z510 Encounter for antineoplastic radiation therapy: Secondary | ICD-10-CM | POA: Diagnosis not present

## 2019-08-08 DIAGNOSIS — C61 Malignant neoplasm of prostate: Secondary | ICD-10-CM | POA: Diagnosis not present

## 2019-08-09 ENCOUNTER — Ambulatory Visit
Admission: RE | Admit: 2019-08-09 | Discharge: 2019-08-09 | Disposition: A | Discharge status: Home / Self Care | Payer: Medicare HMO | Source: Ambulatory Visit | Attending: Radiation Oncology | Admitting: Radiation Oncology

## 2019-08-09 DIAGNOSIS — C61 Malignant neoplasm of prostate: Secondary | ICD-10-CM | POA: Diagnosis not present

## 2019-08-09 DIAGNOSIS — Z510 Encounter for antineoplastic radiation therapy: Secondary | ICD-10-CM | POA: Diagnosis not present

## 2019-08-10 ENCOUNTER — Ambulatory Visit
Admission: RE | Admit: 2019-08-10 | Discharge: 2019-08-10 | Disposition: A | Discharge status: Home / Self Care | Payer: Medicare HMO | Source: Ambulatory Visit | Attending: Radiation Oncology | Admitting: Radiation Oncology

## 2019-08-10 DIAGNOSIS — C61 Malignant neoplasm of prostate: Secondary | ICD-10-CM | POA: Diagnosis not present

## 2019-08-10 DIAGNOSIS — Z510 Encounter for antineoplastic radiation therapy: Secondary | ICD-10-CM | POA: Diagnosis not present

## 2019-08-11 ENCOUNTER — Ambulatory Visit
Admission: RE | Admit: 2019-08-11 | Discharge: 2019-08-11 | Disposition: A | Discharge status: Home / Self Care | Payer: Medicare HMO | Source: Ambulatory Visit | Attending: Radiation Oncology | Admitting: Radiation Oncology

## 2019-08-11 DIAGNOSIS — C61 Malignant neoplasm of prostate: Secondary | ICD-10-CM | POA: Diagnosis not present

## 2019-08-11 DIAGNOSIS — Z510 Encounter for antineoplastic radiation therapy: Secondary | ICD-10-CM | POA: Diagnosis not present

## 2019-08-12 ENCOUNTER — Other Ambulatory Visit: Payer: Self-pay | Admitting: Internal Medicine

## 2019-08-12 DIAGNOSIS — I10 Essential (primary) hypertension: Secondary | ICD-10-CM

## 2019-08-12 MED ORDER — LOSARTAN POTASSIUM 25 MG PO TABS: ORAL_TABLET | ORAL | 1 refills | Status: DC

## 2019-08-15 ENCOUNTER — Ambulatory Visit
Admission: RE | Admit: 2019-08-15 | Discharge: 2019-08-15 | Disposition: A | Discharge status: Home / Self Care | Payer: Medicare HMO | Source: Ambulatory Visit | Attending: Radiation Oncology | Admitting: Radiation Oncology

## 2019-08-15 ENCOUNTER — Other Ambulatory Visit: Payer: Self-pay

## 2019-08-15 DIAGNOSIS — C61 Malignant neoplasm of prostate: Secondary | ICD-10-CM | POA: Insufficient documentation

## 2019-08-15 DIAGNOSIS — Z510 Encounter for antineoplastic radiation therapy: Secondary | ICD-10-CM | POA: Diagnosis not present

## 2019-08-16 ENCOUNTER — Other Ambulatory Visit: Payer: Self-pay

## 2019-08-16 ENCOUNTER — Ambulatory Visit
Admission: RE | Admit: 2019-08-16 | Discharge: 2019-08-16 | Disposition: A | Discharge status: Home / Self Care | Payer: Medicare HMO | Source: Ambulatory Visit | Attending: Radiation Oncology | Admitting: Radiation Oncology

## 2019-08-16 DIAGNOSIS — Z510 Encounter for antineoplastic radiation therapy: Secondary | ICD-10-CM | POA: Diagnosis not present

## 2019-08-16 DIAGNOSIS — C61 Malignant neoplasm of prostate: Secondary | ICD-10-CM | POA: Diagnosis not present

## 2019-08-17 ENCOUNTER — Ambulatory Visit
Admission: RE | Admit: 2019-08-17 | Discharge: 2019-08-17 | Disposition: A | Discharge status: Home / Self Care | Payer: Medicare HMO | Source: Ambulatory Visit | Attending: Radiation Oncology | Admitting: Radiation Oncology

## 2019-08-17 ENCOUNTER — Other Ambulatory Visit: Payer: Self-pay

## 2019-08-17 DIAGNOSIS — Z510 Encounter for antineoplastic radiation therapy: Secondary | ICD-10-CM | POA: Diagnosis not present

## 2019-08-17 DIAGNOSIS — C61 Malignant neoplasm of prostate: Secondary | ICD-10-CM | POA: Diagnosis not present

## 2019-08-18 ENCOUNTER — Ambulatory Visit
Admission: RE | Admit: 2019-08-18 | Discharge: 2019-08-18 | Disposition: A | Discharge status: Home / Self Care | Payer: Medicare HMO | Source: Ambulatory Visit | Attending: Radiation Oncology | Admitting: Radiation Oncology

## 2019-08-18 ENCOUNTER — Other Ambulatory Visit: Payer: Self-pay

## 2019-08-18 DIAGNOSIS — C61 Malignant neoplasm of prostate: Secondary | ICD-10-CM | POA: Diagnosis not present

## 2019-08-18 DIAGNOSIS — Z510 Encounter for antineoplastic radiation therapy: Secondary | ICD-10-CM | POA: Diagnosis not present

## 2019-08-21 ENCOUNTER — Other Ambulatory Visit: Payer: Self-pay

## 2019-08-21 ENCOUNTER — Ambulatory Visit
Admission: RE | Admit: 2019-08-21 | Discharge: 2019-08-21 | Disposition: A | Discharge status: Home / Self Care | Payer: Medicare HMO | Source: Ambulatory Visit | Attending: Radiation Oncology | Admitting: Radiation Oncology

## 2019-08-21 DIAGNOSIS — C61 Malignant neoplasm of prostate: Secondary | ICD-10-CM | POA: Diagnosis not present

## 2019-08-21 DIAGNOSIS — Z510 Encounter for antineoplastic radiation therapy: Secondary | ICD-10-CM | POA: Diagnosis not present

## 2019-08-22 ENCOUNTER — Ambulatory Visit
Admission: RE | Admit: 2019-08-22 | Discharge: 2019-08-22 | Disposition: A | Discharge status: Home / Self Care | Payer: Medicare HMO | Source: Ambulatory Visit | Attending: Radiation Oncology | Admitting: Radiation Oncology

## 2019-08-22 ENCOUNTER — Other Ambulatory Visit: Payer: Self-pay

## 2019-08-22 DIAGNOSIS — C61 Malignant neoplasm of prostate: Secondary | ICD-10-CM | POA: Diagnosis not present

## 2019-08-22 DIAGNOSIS — Z510 Encounter for antineoplastic radiation therapy: Secondary | ICD-10-CM | POA: Diagnosis not present

## 2019-08-23 ENCOUNTER — Other Ambulatory Visit: Payer: Self-pay

## 2019-08-23 ENCOUNTER — Ambulatory Visit
Admission: RE | Admit: 2019-08-23 | Discharge: 2019-08-23 | Disposition: A | Discharge status: Home / Self Care | Payer: Medicare HMO | Source: Ambulatory Visit | Attending: Radiation Oncology | Admitting: Radiation Oncology

## 2019-08-23 DIAGNOSIS — Z510 Encounter for antineoplastic radiation therapy: Secondary | ICD-10-CM | POA: Diagnosis not present

## 2019-08-23 DIAGNOSIS — C61 Malignant neoplasm of prostate: Secondary | ICD-10-CM | POA: Diagnosis not present

## 2019-08-24 ENCOUNTER — Ambulatory Visit
Admission: RE | Admit: 2019-08-24 | Discharge: 2019-08-24 | Disposition: A | Discharge status: Home / Self Care | Payer: Medicare HMO | Source: Ambulatory Visit | Attending: Radiation Oncology | Admitting: Radiation Oncology

## 2019-08-24 ENCOUNTER — Other Ambulatory Visit: Payer: Self-pay

## 2019-08-24 DIAGNOSIS — C61 Malignant neoplasm of prostate: Secondary | ICD-10-CM | POA: Diagnosis not present

## 2019-08-24 DIAGNOSIS — Z510 Encounter for antineoplastic radiation therapy: Secondary | ICD-10-CM | POA: Diagnosis not present

## 2019-08-25 ENCOUNTER — Ambulatory Visit
Admission: RE | Admit: 2019-08-25 | Discharge: 2019-08-25 | Disposition: A | Discharge status: Home / Self Care | Payer: Medicare HMO | Source: Ambulatory Visit | Attending: Radiation Oncology | Admitting: Radiation Oncology

## 2019-08-25 ENCOUNTER — Other Ambulatory Visit: Payer: Self-pay

## 2019-08-25 DIAGNOSIS — Z510 Encounter for antineoplastic radiation therapy: Secondary | ICD-10-CM | POA: Diagnosis not present

## 2019-08-25 DIAGNOSIS — C61 Malignant neoplasm of prostate: Secondary | ICD-10-CM | POA: Diagnosis not present

## 2019-08-28 ENCOUNTER — Other Ambulatory Visit: Payer: Self-pay

## 2019-08-28 ENCOUNTER — Ambulatory Visit
Admission: RE | Admit: 2019-08-28 | Discharge: 2019-08-28 | Disposition: A | Discharge status: Home / Self Care | Payer: Medicare HMO | Source: Ambulatory Visit | Attending: Radiation Oncology | Admitting: Radiation Oncology

## 2019-08-28 DIAGNOSIS — C61 Malignant neoplasm of prostate: Secondary | ICD-10-CM | POA: Diagnosis not present

## 2019-08-28 DIAGNOSIS — Z510 Encounter for antineoplastic radiation therapy: Secondary | ICD-10-CM | POA: Diagnosis not present

## 2019-08-29 ENCOUNTER — Ambulatory Visit
Admission: RE | Admit: 2019-08-29 | Discharge: 2019-08-29 | Disposition: A | Discharge status: Home / Self Care | Payer: Medicare HMO | Source: Ambulatory Visit | Attending: Radiation Oncology | Admitting: Radiation Oncology

## 2019-08-29 ENCOUNTER — Other Ambulatory Visit: Payer: Self-pay

## 2019-08-29 DIAGNOSIS — Z510 Encounter for antineoplastic radiation therapy: Secondary | ICD-10-CM | POA: Diagnosis not present

## 2019-08-29 DIAGNOSIS — C61 Malignant neoplasm of prostate: Secondary | ICD-10-CM | POA: Diagnosis not present

## 2019-08-30 ENCOUNTER — Other Ambulatory Visit: Payer: Self-pay

## 2019-08-30 ENCOUNTER — Ambulatory Visit
Admission: RE | Admit: 2019-08-30 | Discharge: 2019-08-30 | Disposition: A | Discharge status: Home / Self Care | Payer: Medicare HMO | Source: Ambulatory Visit | Attending: Radiation Oncology | Admitting: Radiation Oncology

## 2019-08-30 DIAGNOSIS — Z510 Encounter for antineoplastic radiation therapy: Secondary | ICD-10-CM | POA: Diagnosis not present

## 2019-08-30 DIAGNOSIS — C61 Malignant neoplasm of prostate: Secondary | ICD-10-CM | POA: Diagnosis not present

## 2019-08-31 ENCOUNTER — Encounter: Payer: Self-pay | Admitting: Medical Oncology

## 2019-08-31 ENCOUNTER — Other Ambulatory Visit: Payer: Self-pay

## 2019-08-31 ENCOUNTER — Ambulatory Visit
Admission: RE | Admit: 2019-08-31 | Discharge: 2019-08-31 | Disposition: A | Discharge status: Home / Self Care | Payer: Medicare HMO | Source: Ambulatory Visit | Attending: Radiation Oncology | Admitting: Radiation Oncology

## 2019-08-31 DIAGNOSIS — C61 Malignant neoplasm of prostate: Secondary | ICD-10-CM | POA: Diagnosis not present

## 2019-08-31 DIAGNOSIS — Z510 Encounter for antineoplastic radiation therapy: Secondary | ICD-10-CM | POA: Diagnosis not present

## 2019-09-01 ENCOUNTER — Other Ambulatory Visit: Payer: Self-pay

## 2019-09-01 ENCOUNTER — Ambulatory Visit
Admission: RE | Admit: 2019-09-01 | Discharge: 2019-09-01 | Disposition: A | Discharge status: Home / Self Care | Payer: Medicare HMO | Source: Ambulatory Visit | Attending: Radiation Oncology | Admitting: Radiation Oncology

## 2019-09-01 DIAGNOSIS — Z510 Encounter for antineoplastic radiation therapy: Secondary | ICD-10-CM | POA: Diagnosis not present

## 2019-09-01 DIAGNOSIS — C61 Malignant neoplasm of prostate: Secondary | ICD-10-CM | POA: Diagnosis not present

## 2019-09-01 NOTE — Progress Notes (Signed)
Patient called asking if his wife could attend weekly follow up appointment with Dr. Tammi Klippel, 6/18. I informed him,  she can come but to have her wait in the waiting room until he completes his treatment and then she can join him for the appointment. He voiced understanding and appreciation.

## 2019-09-04 ENCOUNTER — Ambulatory Visit
Admission: RE | Admit: 2019-09-04 | Discharge: 2019-09-04 | Disposition: A | Discharge status: Home / Self Care | Payer: Medicare HMO | Source: Ambulatory Visit | Attending: Radiation Oncology | Admitting: Radiation Oncology

## 2019-09-04 ENCOUNTER — Other Ambulatory Visit: Payer: Self-pay

## 2019-09-04 DIAGNOSIS — Z510 Encounter for antineoplastic radiation therapy: Secondary | ICD-10-CM | POA: Diagnosis not present

## 2019-09-04 DIAGNOSIS — C61 Malignant neoplasm of prostate: Secondary | ICD-10-CM | POA: Diagnosis not present

## 2019-09-05 ENCOUNTER — Encounter: Payer: Self-pay | Admitting: Radiation Oncology

## 2019-09-05 ENCOUNTER — Ambulatory Visit
Admission: RE | Admit: 2019-09-05 | Discharge: 2019-09-05 | Disposition: A | Discharge status: Home / Self Care | Payer: Medicare HMO | Source: Ambulatory Visit | Attending: Radiation Oncology | Admitting: Radiation Oncology

## 2019-09-05 ENCOUNTER — Other Ambulatory Visit: Payer: Self-pay

## 2019-09-05 ENCOUNTER — Encounter: Payer: Self-pay | Admitting: Medical Oncology

## 2019-09-05 DIAGNOSIS — Z510 Encounter for antineoplastic radiation therapy: Secondary | ICD-10-CM | POA: Diagnosis not present

## 2019-09-05 DIAGNOSIS — C61 Malignant neoplasm of prostate: Secondary | ICD-10-CM | POA: Diagnosis not present

## 2019-09-05 NOTE — Progress Notes (Signed)
Patient called stating today is his last radiation. He has done well but states the last week he has been fatigued. He met a nice coupled in the very beginning in the waiting room who gave him inspiration for the journey. He would like to reach out to them and thank them but does not have their contact information. I informed him I cannot give him this information but I can call the couple and ask them to call him. He would appreciative this and voiced his appreciation for the care he has received. I encouraged him to call me in the future if I can help him. He has follow appointment with [place], [place] 7/22.

## 2019-09-06 DIAGNOSIS — R69 Illness, unspecified: Secondary | ICD-10-CM | POA: Diagnosis not present

## 2019-09-07 ENCOUNTER — Telehealth: Payer: Self-pay | Admitting: Radiation Oncology

## 2019-09-07 NOTE — Telephone Encounter (Signed)
Received voicemail message from patient requesting a return call. Phoned patient back to inquire. Patient completed prostate radiation on Tuesday and wants to know when he can stop tamsulosin. Explained radiation side effects are expected to continue for the next two weeks but after that he may want to consider stopping his flomax. Patient states, "I just think that is what has me up urinating at night." Patient endorses taking this medication just before bed. Encouraged he take with his evening meal to see if his nocturia reduced. Advised if lower urinary tract symptoms returned after stopping flomax he should resume it and continue until his follow up with his urologist. Patient verbalized understanding of all reviewed and appreciation for the call back.

## 2019-09-28 DIAGNOSIS — I700 Atherosclerosis of aorta: Secondary | ICD-10-CM | POA: Insufficient documentation

## 2019-09-28 NOTE — Progress Notes (Signed)
MEDICARE ANNUAL WELLNESS VISIT AND FOLLOW UP Assessment:   Diagnoses and all orders for this visit:  Encounter for Medicare annual wellness exam  Aortic atherosclerosis ([place]) Per CT 03/2019 Control blood pressure, cholesterol, glucose, increase exercise.   Essential hypertension Continue medication, good control on home logs Monitor blood pressure at home; call if consistently over 130/80 Continue DASH diet.   Reminder to go to the ER if any CP, SOB, nausea, dizziness, severe HA, changes vision/speech, left arm numbness and tingling and jaw pain.  Pulmonary sarcoidosis (1988)  Followed by pulmonology PRN  Prostate cancer [facility])  Managed by Dr. Junious Silk, s/p radiation, ongoing eligard  Vitamin D deficiency At goal at last check; continue to recommend supplementation for goal of 60-100 Defer vitamin D level to CPE  Other abnormal glucose Recent A1Cs at goal Discussed diet/exercise, weight management  Defer A1C; check BMP  Medication management CBC, CMP/GFR  Hyperlipidemia No longer treated due to age Mild elevations monitored Continue cholesterol diet/exercise Check lipid panel   BMI 23 Continue to recommend diet heavy in fruits and veggies and low in animal meats, cheeses, and dairy products, appropriate calorie intake Discuss exercise recommendations routinely Continue to monitor weight at each visit  Leg muscle cramps No new meds, check labs, electrolytes, CBC and iron Discussed increasing ambulation, daily stretches reviewed and information provided, try foam rolling Follow up if persistent/not improving    Over 30 minutes of exam, counseling, chart review, and critical decision making was performed  Future Appointments  Date Time Provider [place]  10/05/2019  1:30 PM Freeman Caldron, PA-C [facility] None  01/29/2020 10:00 AM Unk Pinto, MD [facility] None  10/14/2020 10:00 AM Liane Comber, NP [facility] None      Plan:   During the  course of the visit the patient was educated and counseled about appropriate screening and preventive services including:    Pneumococcal vaccine   Influenza vaccine  Prevnar 13  Td vaccine  Screening electrocardiogram  Colorectal cancer screening  Diabetes screening  Glaucoma screening  Nutrition counseling    Subjective:  Matthew Aguirre is a 86 y.o. male who presents for Medicare Annual Wellness Visit and 3 month follow up for HTN, hyperlipidemia, glucose management, and vitamin D Def.   He reports in recent weeks has had leg cramping at rest, hamstrings and calves, bilateral interchangeably, mainly at night or after sitting for a while, denies back pain or radicular pain, numbness/tingling,   Patient has hx/o Prostate Cancer (2014 ) followed on Finasteride, has been doing active surveillance per Dr Junious Silk, last year PSA jumped up to 9.88, was referred to Dr. Tresa Moore and underwent MRI on 12/02/2018 which found gleason 8 cancer, underwent biopsy on 03/08/2019. PET scan 03/29/2019 showed no mets. He was initiated on Eligard in Feb 2021, had prostate markers placed 4/6 and underwent external radiation, 40 treatments. He reports night sweats with initiation of eligard, does have night sweats but reports mild.   BMI is Body mass index is 23.02 kg/m., he has been working on diet and exercise, walks daily.  Wt Readings from Last 3 Encounters:  10/02/19 147 lb (66.7 kg)  07/28/19 149 lb 9.6 oz (67.9 kg)  06/28/19 148 lb 6.4 oz (67.3 kg)   Ct abd/pelvis in 03/2019 showed aortic atherosclerosis His blood pressure has been controlled at home (110-130s/60), today their BP is BP: 138/78 He does workout. He denies chest pain, shortness of breath, dizziness.   He is not on cholesterol medication secondary to age.  Hx of intolerance of RYRS, welcol, zetia, pravastatin. Currently taking fish oil. His cholesterol is not at goal. The cholesterol last visit was:   Lab Results  Component Value  Date   CHOL 168 06/28/2019   HDL 37 (L) 06/28/2019   LDLCALC 102 (H) 06/28/2019   TRIG 174 (H) 06/28/2019   CHOLHDL 4.5 06/28/2019   He has been working on diet and exercise for glucose management, and denies foot ulcerations, increased appetite, nausea, paresthesia of the feet, polydipsia, polyuria, visual disturbances, vomiting and weight loss. Last A1C in the office was:  Lab Results  Component Value Date   HGBA1C 5.5 12/28/2018   Last GFR Lab Results  Component Value Date   GFRNONAA 71 07/28/2019    Patient is on Vitamin D supplement and at goal:    Lab Results  Component Value Date   VD25OH 81 12/28/2018        Medication Review:   Current Outpatient Medications (Cardiovascular):  .  bisoprolol-hydrochlorothiazide (ZIAC) 10-6.25 MG tablet, Take 1 tablet Daily for BP   Current Outpatient Medications (Analgesics):  .  acetaminophen (TYLENOL) 325 MG tablet, Take 650 mg by mouth every 6 (six) hours as needed (prn). Marland Kitchen  aspirin 81 MG EC tablet, Take by mouth.   Current Outpatient Medications (Other):  Marland Kitchen  Calcium Carbonate (CALCIUM 600 PO), Take by mouth daily. .  Cholecalciferol (VITAMIN D PO), Take 2,000 Int'l Units by mouth 2 (two) times daily.  .  famotidine (PEPCID) 20 MG tablet, Take 1 tablet prior to breakfast for Acid Indigestion .  finasteride (PROSCAR) 5 MG tablet, Take 1 tablet Daily for Prostate .  Flaxseed, Linseed, (FLAX SEED OIL PO), Take 1,200 mg by mouth 3 (three) times daily.  Marland Kitchen  leuprolide, 6 Month, (ELIGARD) 45 MG injection, Inject 45 mg into the skin every 6 (six) months. .  Magnesium 250 MG TABS, Take 250 mg by mouth daily. .  Omega-3 Fatty Acids (FISH OIL PO), Take 1,200 mg by mouth 2 (two) times daily.  .  pantoprazole (PROTONIX) 40 MG tablet, Take 1 tablet every Morning for Indigestion & Heartburn  Allergies: Allergies  Allergen Reactions  . Pravastatin   . Red Yeast Rice [Cholestin]   . Sulfa Antibiotics   . Vibramycin [Doxycycline  Calcium]   . Zetia [Ezetimibe]     Current Problems (verified) has Essential hypertension; Hyperlipidemia; Vitamin D deficiency; Medication management; Malignant neoplasm of prostate ([place]); Pulmonary sarcoidosis (1988) ; Other abnormal glucose; BMI 23.0-23.9, adult; Plantar fasciitis of left foot; Gastroesophageal reflux disease without esophagitis; and Aortic atherosclerosis ([facility]) on their problem list.  Screening Tests Immunization History  Administered Date(s) Administered  . DT (Pediatric) 05/17/2015  . Influenza, High Dose Seasonal PF 01/23/2014, 02/13/2015, 11/27/2015, 12/01/2016, 12/01/2017, 01/05/2019  . Moderna SARS-COVID-2 Vaccination 05/01/2019, 05/30/2019  . Pneumococcal Conjugate-13 10/12/2013  . Pneumococcal Polysaccharide-23 05/17/2015  . Zoster Recombinat (Shingrix) 08/13/2017, 10/14/2017   Preventative care: Last colonoscopy: 2014, declines further screenings Cologuard: 11/2017, neg  CT abd/pelvis 03/2019 - aortic atherosclerosis, enlarged prostate, bil inguinal hernia (denies sx or surgical referral)  Prior vaccinations: TD or Tdap: 2017  Influenza: 12/2018  Pneumococcal:2017 Prevnar13: 2015 Shingles/Zostavax: 2019 Covid 19: 2/2, 2021, moderna  Names of Other Physician/Practitioners you currently use: 1. [facility] here for primary care 2. Dr. Gershon Crane, eye doctor, last visit 03/2019, glasses  3. Dr. Baird Cancer, dentist, last visit 2021  Patient Care Team: Unk Pinto, MD as PCP - General (Internal Medicine) Festus Aloe, MD as  Consulting Physician (Urology) Inda Castle, MD (Inactive) as Consulting Physician (Gastroenterology) Rutherford Guys, MD as Consulting Physician (Ophthalmology)  Surgical: He  has a past surgical history that includes Treatment fistula anal; Tonsilectomy/adenoidectomy with myringotomy; Prostate biopsy (2020); and Prostate biopsy (2014). Family His family history includes Hyperlipidemia  in his brother; Hypertension in his brother. Social history  He reports that he has never smoked. He has never used smokeless tobacco. He reports that he does not drink alcohol and does not use drugs.  MEDICARE WELLNESS OBJECTIVES: Physical activity: Current Exercise Habits: Home exercise routine, Type of exercise: walking, Time (Minutes): 30, Frequency (Times/Week): 7, Weekly Exercise (Minutes/Week): 210, Intensity: Mild, Exercise limited by: None identified Cardiac risk factors: Cardiac Risk Factors include: advanced age (>35men, >52 women);dyslipidemia;hypertension;male gender Depression/mood screen:   Depression screen [facility] 2/9 10/02/2019  Decreased Interest 0  Down, Depressed, Hopeless 1  PHQ - 2 Score 1    ADLs:  In your present state of health, do you have any difficulty performing the following activities: 10/02/2019 12/28/2018  Hearing? N N  Vision? N N  Difficulty concentrating or making decisions? N N  Walking or climbing stairs? N N  Dressing or bathing? N N  Doing errands, shopping? N N  Some recent data might be hidden     Cognitive Testing  Alert? Yes  Normal Appearance?Yes  Oriented to person? Yes  Place? Yes   Time? Yes  Recall of three objects?  Yes  Can perform simple calculations? Yes  Displays appropriate judgment?Yes  Can read the correct time from a watch face?Yes  EOL planning: Does Patient Have a Medical Advance Directive?: Yes Type of Advance Directive: [place] Does patient want to make changes to medical advance directive?: No - Patient declined Copy of [place] in Chart?: Yes - validated most recent copy scanned in chart (See row information)   Objective:   Today's Vitals   10/02/19 1007  BP: 138/78  Pulse: 72  Temp: (!) 97.5 F (36.4 C)  SpO2: 97%  Weight: 147 lb (66.7 kg)   Body mass index is 23.02 kg/m.  General appearance: alert, no distress, WD/WN, male HEENT: normocephalic, sclerae anicteric,  TMs pearly, nares patent, no discharge or erythema, pharynx normal Oral cavity: MMM, no lesions Neck: supple, no lymphadenopathy, no thyromegaly, no masses Heart: RRR, normal S1, S2, no murmurs Lungs: CTA bilaterally, no wheezes, rhonchi, or rales Abdomen: +bs, soft, non tender, non distended, no masses, no hepatomegaly, no splenomegaly Musculoskeletal: nontender, no swelling, no obvious deformity. He has mild left heel anterior/medial tenderness without palpable abnormality.  Extremities: no edema, no cyanosis, no clubbing Pulses: 2+ symmetric, upper and lower extremities, normal cap refill Neurological: alert, oriented x 3, CN2-12 intact, strength normal upper extremities and lower extremities, sensation normal throughout, DTRs 2+ throughout, no cerebellar signs, gait normal Psychiatric: normal affect, behavior normal, pleasant   Medicare Attestation I have personally reviewed: The patient's medical and social history Their use of alcohol, tobacco or illicit drugs Their current medications and supplements The patient's functional ability including ADLs,fall risks, home safety risks, cognitive, and hearing and visual impairment Diet and physical activities Evidence for depression or mood disorders  The patient's weight, height, BMI, and visual acuity have been recorded in the chart.  I have made referrals, counseling, and provided education to the patient based on review of the above and I have provided the patient with a written personalized care plan for preventive services.  Izora Ribas, NP   10/02/2019

## 2019-10-02 ENCOUNTER — Ambulatory Visit (INDEPENDENT_AMBULATORY_CARE_PROVIDER_SITE_OTHER): Payer: Medicare HMO | Admitting: Adult Health

## 2019-10-02 ENCOUNTER — Encounter: Payer: Self-pay | Admitting: Adult Health

## 2019-10-02 ENCOUNTER — Other Ambulatory Visit: Payer: Self-pay

## 2019-10-02 VITALS — BP 138/78 | HR 72 | Temp 97.5°F | Wt 147.0 lb

## 2019-10-02 DIAGNOSIS — D860 Sarcoidosis of lung: Secondary | ICD-10-CM

## 2019-10-02 DIAGNOSIS — R6889 Other general symptoms and signs: Secondary | ICD-10-CM | POA: Diagnosis not present

## 2019-10-02 DIAGNOSIS — E559 Vitamin D deficiency, unspecified: Secondary | ICD-10-CM

## 2019-10-02 DIAGNOSIS — D649 Anemia, unspecified: Secondary | ICD-10-CM | POA: Diagnosis not present

## 2019-10-02 DIAGNOSIS — Z79899 Other long term (current) drug therapy: Secondary | ICD-10-CM

## 2019-10-02 DIAGNOSIS — R252 Cramp and spasm: Secondary | ICD-10-CM | POA: Diagnosis not present

## 2019-10-02 DIAGNOSIS — K219 Gastro-esophageal reflux disease without esophagitis: Secondary | ICD-10-CM | POA: Diagnosis not present

## 2019-10-02 DIAGNOSIS — Z0000 Encounter for general adult medical examination without abnormal findings: Secondary | ICD-10-CM

## 2019-10-02 DIAGNOSIS — E782 Mixed hyperlipidemia: Secondary | ICD-10-CM

## 2019-10-02 DIAGNOSIS — Z0001 Encounter for general adult medical examination with abnormal findings: Secondary | ICD-10-CM | POA: Diagnosis not present

## 2019-10-02 DIAGNOSIS — I700 Atherosclerosis of aorta: Secondary | ICD-10-CM | POA: Diagnosis not present

## 2019-10-02 DIAGNOSIS — R7309 Other abnormal glucose: Secondary | ICD-10-CM

## 2019-10-02 DIAGNOSIS — Z6823 Body mass index (BMI) 23.0-23.9, adult: Secondary | ICD-10-CM

## 2019-10-02 DIAGNOSIS — I10 Essential (primary) hypertension: Secondary | ICD-10-CM

## 2019-10-02 DIAGNOSIS — C61 Malignant neoplasm of prostate: Secondary | ICD-10-CM

## 2019-10-02 NOTE — Patient Instructions (Addendum)
Matthew Aguirre , Thank you for taking time to come for your Medicare Wellness Visit. I appreciate your ongoing commitment to your health goals. Please review the following plan we discussed and let me know if I can assist you in the future.   These are the goals we discussed: Goals    . Blood Pressure < 130/80    . Exercise 150 min/wk Moderate Activity    . LDL CALC < 100       This is a list of the screening recommended for you and due dates:  Health Maintenance  Topic Date Due  . Flu Shot  10/15/2019  . Tetanus Vaccine  05/16/2025  . COVID-19 Vaccine  Completed  . Pneumonia vaccines  Completed     Try lower back/hip/hamstring stretches daily in the morning and the evening   Leg Cramps Leg cramps occur when one or more muscles tighten and you have no control over this tightening (involuntary muscle contraction). Muscle cramps can develop in any muscle, but the most common place is in the calf muscles of the leg. Those cramps can occur during exercise or when you are at rest. Leg cramps are painful, and they may last for a few seconds to a few minutes. Cramps may return several times before they finally stop. Usually, leg cramps are not caused by a serious medical problem. In many cases, the cause is not known. Some common causes include:  Excessive physical effort (overexertion), such as during intense exercise.  Overuse from repetitive motions, or doing the same thing over and over.  Staying in a certain position for a long period of time.  Improper preparation, form, or technique while performing a sport or an activity.  Dehydration.  Injury.  Side effects of certain medicines.  Abnormally low levels of minerals in your blood (electrolytes), especially potassium and calcium. This could result from: ? Pregnancy. ? Taking diuretic medicines. Follow these instructions at home: Eating and drinking  Drink enough fluid to keep your urine pale yellow. Staying hydrated may  help prevent cramps.  Eat a healthy diet that includes plenty of nutrients to help your muscles function. A healthy diet includes fruits and vegetables, lean protein, whole grains, and low-fat or nonfat dairy products. Managing pain, stiffness, and swelling      Try massaging, stretching, and relaxing the affected muscle. Do this for several minutes at a time.  If directed, put ice on areas that are sore or painful after a cramp: ? Put ice in a plastic bag. ? Place a towel between your skin and the bag. ? Leave the ice on for 20 minutes, 2-3 times a day.  If directed, apply heat to muscles that are tense or tight. Do this before you exercise, or as often as told by your health care provider. Use the heat source that your health care provider recommends, such as a moist heat pack or a heating pad. ? Place a towel between your skin and the heat source. ? Leave the heat on for 20-30 minutes. ? Remove the heat if your skin turns bright red. This is especially important if you are unable to feel pain, heat, or cold. You may have a greater risk of getting burned.  Try taking hot showers or baths to help relax tight muscles. General instructions  If you are having frequent leg cramps, avoid intense exercise for several days.  Take over-the-counter and prescription medicines only as told by your health care provider.  Keep all follow-up  visits as told by your health care provider. This is important. Contact a health care provider if:  Your leg cramps get more severe or more frequent, or they do not improve over time.  Your foot becomes cold, numb, or blue. Summary  Muscle cramps can develop in any muscle, but the most common place is in the calf muscles of the leg.  Leg cramps are painful, and they may last for a few seconds to a few minutes.  Usually, leg cramps are not caused by a serious medical problem. Often, the cause is not known.  Stay hydrated and take over-the-counter and  prescription medicines only as told by your health care provider. This information is not intended to replace advice given to you by your health care provider. Make sure you discuss any questions you have with your health care provider. Document Revised: 02/12/2017 Document Reviewed: 12/10/2016 Elsevier Patient Education  2020 [place].        Sciatica Rehab Ask your health care provider which exercises are safe for you. Do exercises exactly as told by your health care provider and adjust them as directed. It is normal to feel mild stretching, pulling, tightness, or discomfort as you do these exercises. Stop right away if you feel sudden pain or your pain gets worse. Do not begin these exercises until told by your health care provider. Stretching and range-of-motion exercises These exercises warm up your muscles and joints and improve the movement and flexibility of your hips and back. These exercises also help to relieve pain, numbness, and tingling. Sciatic nerve glide 1. Sit in a chair with your head facing down toward your chest. Place your hands behind your back. Let your shoulders slump forward. 2. Slowly straighten one of your legs while you tilt your head back as if you are looking toward the ceiling. Only straighten your leg as far as you can without making your symptoms worse. 3. Hold this position for __________ seconds. 4. Slowly return to the starting position. 5. Repeat with your other leg. Repeat __________ times. Complete this exercise __________ times a day. Knee to chest with hip adduction and internal rotation  1. Lie on your back on a firm surface with both legs straight. 2. Bend one of your knees and move it up toward your chest until you feel a gentle stretch in your lower back and buttock. Then, move your knee toward the shoulder that is on the opposite side from your leg. This is hip adduction and internal rotation. ? Hold your leg in this position by holding on  to the front of your knee. 3. Hold this position for __________ seconds. 4. Slowly return to the starting position. 5. Repeat with your other leg. Repeat __________ times. Complete this exercise __________ times a day. Prone extension on elbows  1. Lie on your abdomen on a firm surface. A bed may be too soft for this exercise. 2. Prop yourself up on your elbows. 3. Use your arms to help lift your chest up until you feel a gentle stretch in your abdomen and your lower back. ? This will place some of your body weight on your elbows. If this is uncomfortable, try stacking pillows under your chest. ? Your hips should stay down, against the surface that you are lying on. Keep your hip and back muscles relaxed. 4. Hold this position for __________ seconds. 5. Slowly relax your upper body and return to the starting position. Repeat __________ times. Complete this exercise __________  times a day. Strengthening exercises These exercises build strength and endurance in your back. Endurance is the ability to use your muscles for a long time, even after they get tired. Pelvic tilt This exercise strengthens the muscles that lie deep in the abdomen. 1. Lie on your back on a firm surface. Bend your knees and keep your feet flat on the floor. 2. Tense your abdominal muscles. Tip your pelvis up toward the ceiling and flatten your lower back into the floor. ? To help with this exercise, you may place a small towel under your lower back and try to push your back into the towel. 3. Hold this position for __________ seconds. 4. Let your muscles relax completely before you repeat this exercise. Repeat __________ times. Complete this exercise __________ times a day. Alternating arm and leg raises  1. Get on your hands and knees on a firm surface. If you are on a hard floor, you may want to use padding, such as an exercise mat, to cushion your knees. 2. Line up your arms and legs. Your hands should be directly  below your shoulders, and your knees should be directly below your hips. 3. Lift your left leg behind you. At the same time, raise your right arm and straighten it in front of you. ? Do not lift your leg higher than your hip. ? Do not lift your arm higher than your shoulder. ? Keep your abdominal and back muscles tight. ? Keep your hips facing the ground. ? Do not arch your back. ? Keep your balance carefully, and do not hold your breath. 4. Hold this position for __________ seconds. 5. Slowly return to the starting position. 6. Repeat with your right leg and your left arm. Repeat __________ times. Complete this exercise __________ times a day. Posture and body mechanics Good posture and healthy body mechanics can help to relieve stress in your body's tissues and joints. Body mechanics refers to the movements and positions of your body while you do your daily activities. Posture is part of body mechanics. Good posture means:  Your spine is in its natural S-curve position (neutral).  Your shoulders are pulled back slightly.  Your head is not tipped forward. Follow these guidelines to improve your posture and body mechanics in your everyday activities. Standing   When standing, keep your spine neutral and your feet about hip width apart. Keep a slight bend in your knees. Your ears, shoulders, and hips should line up.  When you do a task in which you stand in one place for a long time, place one foot up on a stable object that is 2-4 inches (5-10 cm) high, such as a footstool. This helps keep your spine neutral. Sitting   When sitting, keep your spine neutral and keep your feet flat on the floor. Use a footrest, if necessary, and keep your thighs parallel to the floor. Avoid rounding your shoulders, and avoid tilting your head forward.  When working at a desk or a computer, keep your desk at a height where your hands are slightly lower than your elbows. Slide your chair under your desk  so you are close enough to maintain good posture.  When working at a computer, place your monitor at a height where you are looking straight ahead and you do not have to tilt your head forward or downward to look at the screen. Resting  When lying down and resting, avoid positions that are most painful for you.  If you  have pain with activities such as sitting, bending, stooping, or squatting, lie in a position in which your body does not bend very much. For example, avoid curling up on your side with your arms and knees near your chest (fetal position).  If you have pain with activities such as standing for a long time or reaching with your arms, lie with your spine in a neutral position and bend your knees slightly. Try the following positions: ? Lying on your side with a pillow between your knees. ? Lying on your back with a pillow under your knees. Lifting   When lifting objects, keep your feet at least shoulder width apart and tighten your abdominal muscles.  Bend your knees and hips and keep your spine neutral. It is important to lift using the strength of your legs, not your back. Do not lock your knees straight out.  Always ask for help to lift heavy or awkward objects. This information is not intended to replace advice given to you by your health care provider. Make sure you discuss any questions you have with your health care provider. Document Revised: 06/24/2018 Document Reviewed: 03/24/2018 Elsevier Patient Education  [place].

## 2019-10-03 ENCOUNTER — Telehealth: Payer: Self-pay

## 2019-10-03 ENCOUNTER — Other Ambulatory Visit: Payer: Self-pay

## 2019-10-03 ENCOUNTER — Encounter: Payer: Self-pay | Admitting: Urology

## 2019-10-03 LAB — COMPLETE METABOLIC PANEL WITH GFR
AG Ratio: 2.2 (calc) (ref 1.0–2.5)
ALT: 23 U/L (ref 9–46)
AST: 19 U/L (ref 10–35)
Albumin: 4.4 g/dL (ref 3.6–5.1)
Alkaline phosphatase (APISO): 64 U/L (ref 35–144)
BUN: 18 mg/dL (ref 7–25)
CO2: 30 mmol/L (ref 20–32)
Calcium: 9.9 mg/dL (ref 8.6–10.3)
Chloride: 104 mmol/L (ref 98–110)
Creat: 0.99 mg/dL (ref 0.70–1.11)
GFR, Est African American: 81 mL/min/{1.73_m2} (ref >=60)
GFR, Est Non African American: 70 mL/min/{1.73_m2} (ref >=60)
Globulin: 2 g/dL (calc) (ref 1.9–3.7)
Glucose, Bld: 87 mg/dL (ref 65–99)
Potassium: 4.4 mmol/L (ref 3.5–5.3)
Sodium: 141 mmol/L (ref 135–146)
Total Bilirubin: 0.6 mg/dL (ref 0.2–1.2)
Total Protein: 6.4 g/dL (ref 6.1–8.1)

## 2019-10-03 LAB — CBC WITH DIFFERENTIAL/PLATELET
Absolute Monocytes: 560 cells/uL (ref 200–950)
Basophils Absolute: 30 cells/uL (ref 0–200)
Basophils Relative: 0.6 %
Eosinophils Absolute: 40 cells/uL (ref 15–500)
Eosinophils Relative: 0.8 %
HCT: 43.9 % (ref 38.5–50.0)
Hemoglobin: 14.6 g/dL (ref 13.2–17.1)
Lymphs Abs: 360 cells/uL — ABNORMAL LOW (ref 850–3900)
MCH: 31.3 pg (ref 27.0–33.0)
MCHC: 33.3 g/dL (ref 32.0–36.0)
MCV: 94 fL (ref 80.0–100.0)
MPV: 11.4 fL (ref 7.5–12.5)
Monocytes Relative: 11.2 %
Neutro Abs: 4010 cells/uL (ref 1500–7800)
Neutrophils Relative %: 80.2 %
Platelets: 142 10*3/uL (ref 140–400)
RBC: 4.67 10*6/uL (ref 4.20–5.80)
RDW: 12.7 % (ref 11.0–15.0)
Total Lymphocyte: 7.2 %
WBC: 5 10*3/uL (ref 3.8–10.8)

## 2019-10-03 LAB — LIPID PANEL
Cholesterol: 169 mg/dL (ref <200)
HDL: 36 mg/dL — ABNORMAL LOW (ref >=40)
LDL Cholesterol (Calc): 100 mg/dL (calc) — ABNORMAL HIGH
Non-HDL Cholesterol (Calc): 133 mg/dL (calc) — ABNORMAL HIGH (ref <130)
Total CHOL/HDL Ratio: 4.7 (calc) (ref <5.0)
Triglycerides: 213 mg/dL — ABNORMAL HIGH (ref <150)

## 2019-10-03 LAB — IRON,TIBC AND FERRITIN PANEL
%SAT: 35 % (calc) (ref 20–48)
Ferritin: 172 ng/mL (ref 24–380)
Iron: 108 ug/dL (ref 50–180)
TIBC: 313 mcg/dL (calc) (ref 250–425)

## 2019-10-03 LAB — MAGNESIUM: Magnesium: 2.1 mg/dL (ref 1.5–2.5)

## 2019-10-03 LAB — TSH: TSH: 0.95 mIU/L (ref 0.40–4.50)

## 2019-10-03 NOTE — Telephone Encounter (Signed)
Left voicemail message to call back in regards to 1 month follow -up appointment with Ashlyn Bruning PA on 10/05/19 at 1:30pm. Called to review meaningful use, AUA and prostate questions. TM

## 2019-10-03 NOTE — Progress Notes (Signed)
Patient has 1 month follow-up for prostate cancer. Patient states nocturia 3-4 times per night. Patient denies dysuria. Patient states moderate urine stream. Patient states that he is emptying his bladder completely. Patient states urgency and is able to hold urine. Patient denies leakage. Patient denies hesitancy or straining. Patient states moderate fatigue. Patient states he has an appointment with [facility] 10/16/19.

## 2019-10-05 ENCOUNTER — Other Ambulatory Visit: Payer: Self-pay

## 2019-10-05 ENCOUNTER — Ambulatory Visit
Admission: RE | Admit: 2019-10-05 | Discharge: 2019-10-05 | Disposition: A | Discharge status: Home / Self Care | Payer: Medicare HMO | Source: Ambulatory Visit | Attending: Urology | Admitting: Urology

## 2019-10-05 DIAGNOSIS — C61 Malignant neoplasm of prostate: Secondary | ICD-10-CM

## 2019-10-05 NOTE — Progress Notes (Signed)
Radiation Oncology         [phone] ________________________________  Name: JAIMON BUGAJ MRN: [id number]  Date: 10/05/2019  DOB: [birth year]  Post Treatment Note  CC: Unk Pinto, MD  Festus Aloe, MD  Diagnosis:   86 y.o. 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  Interval Since Last Radiation:  4 weeks, concurrent with LT-ADT- received 6 month Eligard 04/18/19 07/11/19 - 09/05/19:   1. The prostate, seminal vesicles, and pelvic lymph nodes were initially treated to 45 Gy in 25 fractions of 1.8 Gy  2. The prostate only was boosted to 75 Gy with 15 additional fractions of 2.0 Gy   Narrative:  I spoke with the patient to conduct his routine scheduled 1 month follow up visit via telephone to spare the patient unnecessary potential exposure in the healthcare setting during the current COVID-19 pandemic.  The patient was notified in advance and gave permission to proceed with this visit format. He tolerated his radiation treatments relatively well with only mild urinary symptoms with urgency and nocturia x4/night.  He had occasional episodes of loose stools but otherwise no bowel issues and did not report any significant fatigue or change in his energy level throughout treatment.                              On review of systems, the patient states that he is doing very well in general.  He continues with nocturia 3-4 times per night and occasional constipation.  He specifically denies dysuria, gross hematuria, weak stream, straining, incomplete bladder emptying or incontinence.  He continues with modest fatigue and occasional hot flashes associated with his ADT but otherwise feels that he tolerates this well.  He reports a healthy appetite and is maintaining his weight.  He denies abdominal pain, nausea, vomiting or diarrhea.  He has had some achiness in bilateral hips, particularly when he is laying in  the bed at night and is occasionally radiates into the lower legs.  He has also had some leg cramping intermittently both day and night, not associated with activity.  He denies any swelling, weakness or paresthesias in the lower extremities.  He feels like he is gradually improving and overall is pleased with his progress to date.  ALLERGIES:  is allergic to pravastatin, red yeast rice [cholestin], sulfa antibiotics, vibramycin [doxycycline calcium], and zetia [ezetimibe].  Meds: Current Outpatient Medications  Medication Sig Dispense Refill  . acetaminophen (TYLENOL) 325 MG tablet Take 650 mg by mouth every 6 (six) hours as needed (prn).    Marland Kitchen aspirin 81 MG EC tablet Take by mouth.    . bisoprolol-hydrochlorothiazide (ZIAC) 10-6.25 MG tablet Take 1 tablet Daily for BP 90 tablet 3  . Calcium Carbonate (CALCIUM 600 PO) Take by mouth daily.    . Cholecalciferol (VITAMIN D PO) Take 2,000 Int'l Units by mouth 2 (two) times daily.     . famotidine (PEPCID) 20 MG tablet Take 1 tablet prior to breakfast for Acid Indigestion 90 tablet 3  . finasteride (PROSCAR) 5 MG tablet Take 1 tablet Daily for Prostate 90 tablet 3  . Flaxseed, Linseed, (FLAX SEED OIL PO) Take 1,200 mg by mouth 3 (three) times daily.     Marland Kitchen leuprolide, 6 Month, (ELIGARD) 45 MG injection Inject 45 mg into the skin every 6 (six) months.    . Magnesium 250 MG  TABS Take 250 mg by mouth daily.    . Omega-3 Fatty Acids (FISH OIL PO) Take 1,200 mg by mouth 2 (two) times daily.     . pantoprazole (PROTONIX) 40 MG tablet Take 1 tablet every Morning for Indigestion & Heartburn 90 tablet 3   No current facility-administered medications for this encounter.    Physical Findings:  vitals were not taken for this visit.   /Unable to assess due to telephone follow up visit format.  Lab Findings: Lab Results  Component Value Date   WBC 5.0 10/02/2019   HGB 14.6 10/02/2019   HCT 43.9 10/02/2019   MCV 94.0 10/02/2019   PLT 142 10/02/2019      Radiographic Findings: No results found.  Impression/Plan: 1. 86 y.o. 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 He will continue to follow up with urology for ongoing PSA determinations and has an appointment scheduled for labs on 10/11/19 and a visit with Dr. Junious Silk on 10/16/19. He understands what to expect with regards to PSA monitoring going forward. I will look forward to following his response to treatment via correspondence with urology, and would be happy to continue to participate in his care if clinically indicated. I talked to the patient about what to expect in the future, including his risk for erectile dysfunction and rectal bleeding. I encouraged him to call or return to the office if he has any questions regarding his previous radiation or possible radiation side effects. He was comfortable with this plan and will follow up as needed.  Today, a comprehensive survivorship care plan and treatment summary was reviewed with the patient today detailing his prostate cancer diagnosis, treatment course, potential late/long-term effects of treatment, appropriate follow-up care with recommendations for the future, and patient education resources.  A copy of this summary, along with a letter will be sent to the patient's primary care provider via mail/fax/In Basket message after today's visit.   2. Cancer screening:  Due to Mr. Schoenberger's history and his age, he should receive screening for skin cancers and colon cancer.  The information and recommendations are listed on the patient's comprehensive care plan/treatment summary and were reviewed in detail with the patient.     3. Health maintenance and wellness promotion: Mr. Wurzer was encouraged to consume 5-7 servings of fruits and vegetables per day. He was provided a copy of the "Nutrition Rainbow" handout, as well as the handout "Take Control of Your Health  and [place]" from the [employer].  He was also encouraged to engage in moderate to vigorous exercise for 30 minutes per day most days of the week. Information was provided regarding the [facility] fitness program, which is designed for cancer survivors to help them become more physically fit after cancer treatments. We discussed that a healthy BMI is 18.5-24.9 and that maintaining a healthy weight reduces risk of cancer recurrences.  He was instructed to limit his alcohol consumption and continue to abstain from tobacco use.  Lastly, he was encouraged to use sunscreen and wear protective clothing when in the sun.     4. Support services/counseling: It is not uncommon for this period of the patient's cancer care trajectory to be one of many emotions and stressors.  Mr. Abdou was encouraged to take advantage of our many support services programs, support groups, and/or counseling in coping with his new life as a cancer survivor after completing  anti-cancer treatment.  He was offered support today through active listening and expressive supportive counseling.  He was given information regarding our available services and encouraged to contact me with any questions or for help enrolling in any of our support group/programs.       Nicholos Johns, PA-C

## 2019-10-11 DIAGNOSIS — C61 Malignant neoplasm of prostate: Secondary | ICD-10-CM | POA: Diagnosis not present

## 2019-10-16 DIAGNOSIS — R102 Pelvic and perineal pain: Secondary | ICD-10-CM | POA: Diagnosis not present

## 2019-10-16 DIAGNOSIS — C61 Malignant neoplasm of prostate: Secondary | ICD-10-CM | POA: Diagnosis not present

## 2019-10-24 ENCOUNTER — Ambulatory Visit (INDEPENDENT_AMBULATORY_CARE_PROVIDER_SITE_OTHER): Payer: Medicare HMO | Admitting: Adult Health

## 2019-10-24 ENCOUNTER — Other Ambulatory Visit: Payer: Self-pay

## 2019-10-24 ENCOUNTER — Encounter: Payer: Self-pay | Admitting: Adult Health

## 2019-10-24 VITALS — BP 134/78 | HR 75 | Temp 97.7°F | Wt 145.0 lb

## 2019-10-24 DIAGNOSIS — M5417 Radiculopathy, lumbosacral region: Secondary | ICD-10-CM

## 2019-10-24 DIAGNOSIS — M25552 Pain in left hip: Secondary | ICD-10-CM

## 2019-10-24 MED ORDER — PREDNISONE 20 MG PO TABS
ORAL_TABLET | ORAL | 0 refills | Status: DC
Start: 2019-10-24 — End: 2019-10-31

## 2019-10-24 NOTE — Progress Notes (Signed)
Assessment and Plan:  Matthew Aguirre was seen today for hip pain.  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 Neurosurgeon distributed. Proper lifting, bending technique discussed. Stretching exercises discussed. -     predniSONE (DELTASONE) 20 MG tablet; 3 tablets daily with food for 3 days, 2 tabs daily for 3 days, 1 tab a day for 5 days.  Further disposition pending results of labs. Discussed med's effects and SE's.   Over 15 minutes of exam, counseling, chart review, and critical decision making was performed.   Future Appointments  Date Time Provider [place]  01/29/2020 10:00 AM Unk Pinto, MD [facility] None  10/14/2020 10:00 AM Liane Comber, NP [facility] None    ------------------------------------------------------------------------------------------------------------------   HPI BP 134/78   Pulse 75   Temp 97.7 F (36.5 C)   Wt 145 lb (65.8 kg)   SpO2 98%   BMI 22.71 kg/m   86 y.o.male without hx of back problems with prostate cancer s/p radiation treatments presents for evaluation of 4-6 weeks of intermittent aching in his posterior left hip with positional tingling and dull sensation in his left foot (sole and toes). He reports worse with playing organ, sitting extended periods on couch, or with lying on his left hip. He has tried tylenol and ibuprofen 400 mg prior to bedtime without improvement. Denies weakness of extremity, denies back pain.   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  He had MRI prostate 06/30/2019, no report accessible but per patient was advised no bone mets.   Past Medical History:  Diagnosis Date  . Essential hypertension 02/15/2007  . Hyperlipidemia 07/13/2013  . Prediabetes 07/13/2013  . Prostate cancer ([place])   . Pulmonary nodule (2008 resolved on f/u)  02/15/2007  . Vitamin D deficiency 07/13/2013     Allergies  Allergen Reactions  . Pravastatin   . Red Yeast Rice [Cholestin]   . Sulfa Antibiotics   . Vibramycin [Doxycycline Calcium]   . Zetia [Ezetimibe]     Current Outpatient Medications on File Prior to Visit  Medication Sig  . acetaminophen (TYLENOL) 500 MG tablet Take 500 mg by mouth as needed (prn).   Marland Kitchen aspirin 81 MG EC tablet Take by mouth.  . bisoprolol-hydrochlorothiazide (ZIAC) 10-6.25 MG tablet Take 1 tablet Daily for BP  . Calcium Carbonate (CALCIUM 600 PO) Take by mouth daily.  . Cholecalciferol (VITAMIN D PO) Take 2,000 Int'l Units by mouth 2 (two) times daily.   . famotidine (PEPCID) 20 MG tablet Take 1 tablet prior to breakfast for Acid Indigestion (Patient taking differently: Take 1 tablet in the evening for Acid Indigestion)  . finasteride (PROSCAR) 5 MG tablet Take 1 tablet Daily for Prostate  . Flaxseed, Linseed, (FLAX SEED OIL PO) Take 1,200 mg by mouth 3 (three) times daily.   Marland Kitchen leuprolide, 6 Month, (ELIGARD) 45 MG injection Inject 45 mg into the skin every 6 (six) months.  . Magnesium 250 MG TABS Take 250 mg by mouth  daily.  . Omega-3 Fatty Acids (FISH OIL PO) Take 1,200 mg by mouth 2 (two) times daily.   . pantoprazole (PROTONIX) 40 MG tablet Take 1 tablet every Morning for Indigestion & Heartburn   No current facility-administered medications on file prior to visit.    ROS: all negative except above.   Physical Exam:  BP 134/78   Pulse 75   Temp 97.7 F (36.5 C)   Wt 145 lb (65.8 kg)   SpO2 98%    BMI 22.71 kg/m   General Appearance: Well nourished, well dressed in no apparent distress. Eyes: PERRLA, conjunctiva no swelling or erythema ENT/Mouth: mask in place; Hearing normal.  Neck: Supple, thyroid normal.  Respiratory: Respiratory effort normal, BS equal bilaterally without rales, rhonchi, wheezing or stridor.  Cardio: RRR with no MRGs. Brisk peripheral pulses without edema.  Abdomen: Soft, + BS.  Non tender, no guarding, rebound, masses. Lymphatics: Non tender without lymphadenopathy.  Musculoskeletal: Full ROM, 5/5 strength bil lower extremities, normal gait. Intact ROM hips, no midline spine tenderness, SI joint tenderness; non-tender over bursa and supporting musculature. He does report tinging in left toes with straight leg raise. Skin: Warm, dry without rashes, lesions, ecchymosis.  Neuro: Normal muscle tone, Sensation symmetrical and intact to monofilament bil feet.  Psych: Awake and oriented X 3, normal affect, Insight and Judgment appropriate.     Izora Ribas, NP 11:17 AM [facility]

## 2019-10-24 NOTE — Patient Instructions (Signed)
Sciatica Rehab Ask your health care provider which exercises are safe for you. Do exercises exactly as told by your health care provider and adjust them as directed. It is normal to feel mild stretching, pulling, tightness, or discomfort as you do these exercises. Stop right away if you feel sudden pain or your pain gets worse. Do not begin these exercises until told by your health care provider. Stretching and range-of-motion exercises These exercises warm up your muscles and joints and improve the movement and flexibility of your hips and back. These exercises also help to relieve pain, numbness, and tingling. Sciatic nerve glide 1. Sit in a chair with your head facing down toward your chest. Place your hands behind your back. Let your shoulders slump forward. 2. Slowly straighten one of your legs while you tilt your head back as if you are looking toward the ceiling. Only straighten your leg as far as you can without making your symptoms worse. 3. Hold this position for __________ seconds. 4. Slowly return to the starting position. 5. Repeat with your other leg. Repeat __________ times. Complete this exercise __________ times a day. Knee to chest with hip adduction and internal rotation  1. Lie on your back on a firm surface with both legs straight. 2. Bend one of your knees and move it up toward your chest until you feel a gentle stretch in your lower back and buttock. Then, move your knee toward the shoulder that is on the opposite side from your leg. This is hip adduction and internal rotation. ? Hold your leg in this position by holding on to the front of your knee. 3. Hold this position for __________ seconds. 4. Slowly return to the starting position. 5. Repeat with your other leg. Repeat __________ times. Complete this exercise __________ times a day. Prone extension on elbows  1. Lie on your abdomen on a firm surface. A bed may be too soft for this exercise. 2. Prop yourself up on  your elbows. 3. Use your arms to help lift your chest up until you feel a gentle stretch in your abdomen and your lower back. ? This will place some of your body weight on your elbows. If this is uncomfortable, try stacking pillows under your chest. ? Your hips should stay down, against the surface that you are lying on. Keep your hip and back muscles relaxed. 4. Hold this position for __________ seconds. 5. Slowly relax your upper body and return to the starting position. Repeat __________ times. Complete this exercise __________ times a day. Strengthening exercises These exercises build strength and endurance in your back. Endurance is the ability to use your muscles for a long time, even after they get tired. Pelvic tilt This exercise strengthens the muscles that lie deep in the abdomen. 1. Lie on your back on a firm surface. Bend your knees and keep your feet flat on the floor. 2. Tense your abdominal muscles. Tip your pelvis up toward the ceiling and flatten your lower back into the floor. ? To help with this exercise, you may place a small towel under your lower back and try to push your back into the towel. 3. Hold this position for __________ seconds. 4. Let your muscles relax completely before you repeat this exercise. Repeat __________ times. Complete this exercise __________ times a day. Alternating arm and leg raises  1. Get on your hands and knees on a firm surface. If you are on a hard floor, you may want to use   padding, such as an exercise mat, to cushion your knees. 2. Line up your arms and legs. Your hands should be directly below your shoulders, and your knees should be directly below your hips. 3. Lift your left leg behind you. At the same time, raise your right arm and straighten it in front of you. ? Do not lift your leg higher than your hip. ? Do not lift your arm higher than your shoulder. ? Keep your abdominal and back muscles tight. ? Keep your hips facing the  ground. ? Do not arch your back. ? Keep your balance carefully, and do not hold your breath. 4. Hold this position for __________ seconds. 5. Slowly return to the starting position. 6. Repeat with your right leg and your left arm. Repeat __________ times. Complete this exercise __________ times a day. Posture and body mechanics Good posture and healthy body mechanics can help to relieve stress in your body's tissues and joints. Body mechanics refers to the movements and positions of your body while you do your daily activities. Posture is part of body mechanics. Good posture means:  Your spine is in its natural S-curve position (neutral).  Your shoulders are pulled back slightly.  Your head is not tipped forward. Follow these guidelines to improve your posture and body mechanics in your everyday activities. Standing   When standing, keep your spine neutral and your feet about hip width apart. Keep a slight bend in your knees. Your ears, shoulders, and hips should line up.  When you do a task in which you stand in one place for a long time, place one foot up on a stable object that is 2-4 inches (5-10 cm) high, such as a footstool. This helps keep your spine neutral. Sitting   When sitting, keep your spine neutral and keep your feet flat on the floor. Use a footrest, if necessary, and keep your thighs parallel to the floor. Avoid rounding your shoulders, and avoid tilting your head forward.  When working at a desk or a computer, keep your desk at a height where your hands are slightly lower than your elbows. Slide your chair under your desk so you are close enough to maintain good posture.  When working at a computer, place your monitor at a height where you are looking straight ahead and you do not have to tilt your head forward or downward to look at the screen. Resting  When lying down and resting, avoid positions that are most painful for you.  If you have pain with activities  such as sitting, bending, stooping, or squatting, lie in a position in which your body does not bend very much. For example, avoid curling up on your side with your arms and knees near your chest (fetal position).  If you have pain with activities such as standing for a long time or reaching with your arms, lie with your spine in a neutral position and bend your knees slightly. Try the following positions: ? Lying on your side with a pillow between your knees. ? Lying on your back with a pillow under your knees. Lifting   When lifting objects, keep your feet at least shoulder width apart and tighten your abdominal muscles.  Bend your knees and hips and keep your spine neutral. It is important to lift using the strength of your legs, not your back. Do not lock your knees straight out.  Always ask for help to lift heavy or awkward objects. This information is not   intended to replace advice given to you by your health care provider. Make sure you discuss any questions you have with your health care provider. Document Revised: 06/24/2018 Document Reviewed: 03/24/2018 Elsevier Patient Education  2020 [employer]  

## 2019-10-31 ENCOUNTER — Encounter: Payer: Self-pay | Admitting: Internal Medicine

## 2019-10-31 ENCOUNTER — Other Ambulatory Visit: Payer: Self-pay

## 2019-10-31 ENCOUNTER — Ambulatory Visit (INDEPENDENT_AMBULATORY_CARE_PROVIDER_SITE_OTHER): Payer: Medicare HMO | Admitting: Internal Medicine

## 2019-10-31 VITALS — BP 126/80 | HR 80 | Temp 96.7°F | Resp 16 | Ht 66.0 in | Wt 148.4 lb

## 2019-10-31 DIAGNOSIS — M5432 Sciatica, left side: Secondary | ICD-10-CM

## 2019-10-31 DIAGNOSIS — D485 Neoplasm of uncertain behavior of skin: Secondary | ICD-10-CM | POA: Diagnosis not present

## 2019-10-31 MED ORDER — GABAPENTIN 100 MG PO CAPS: ORAL_CAPSULE | ORAL | 0 refills | Status: DC

## 2019-10-31 MED ORDER — DEXAMETHASONE 4 MG PO TABS: ORAL_TABLET | ORAL | 0 refills | Status: DC

## 2019-10-31 NOTE — Progress Notes (Addendum)
History of Present Illness:     This nice 86 yo WWM with hx/o HTN, HLD, Prediabetes, Prostate Cancer  and Vitamin D Deficiency presents with c/o Left sciatica type aching pain from the Lt buttock radiating down back of thigh & calf to Lt foot with intensity of 3 to 5 and also numbness of foot usually awakens him 2-3 x /night & wakes up with the discomfort, but reports when up & about the pain / discomfort lessens and resolves. Had been treated recently for this with a prednisone taper with improvement of sx's.      He also has concerns re: a crusty lesion of the superior Lt ear helix.   Medications  .  bisoprolol-hydrochlorothiazide  10-6.25 MG tablet, Take 1 tablet Daily for BP .  acetaminophen (TYLENOL) 500 MG tablet, Take 500 mg  as needed (prn).  Marland Kitchen  aspirin 81 MG EC tablet, Take daily. .  Calcium Carbonate (CALCIUM 600 PO), Take  daily. Marland Kitchen  VITAMIN D, Take 2,000 Int'l Units  2  times daily.  .  famotidine 20 MG tablet, Take  at bedtime. Marland Kitchen  FLAX SEED OIL, Take 1,200 mg  3  times daily.  Marland Kitchen  leuprolide, 6 Month, (ELIGARD) 45 MG injection, Inject 45 mg into the skin every 6 (six) months. .  Magnesium 250 MG TABS, Takedaily. .  Omega-3 FISH OIL, Take 1,200 mg  2  times daily.  .  pantoprazole  40 MG tablet, Take 1 tablet every Morning for Indigestion & Heartburn  Problem list He has Essential hypertension; Hyperlipidemia; Vitamin D deficiency; Medication management; Malignant neoplasm of prostate ([place]); Pulmonary sarcoidosis (1988) ; Other abnormal glucose; BMI 23.0-23.9, adult; Plantar fasciitis of left foot; Gastroesophageal reflux disease without esophagitis; and Aortic atherosclerosis (HCC) on their problem list.   Observations/Objective:  BP 126/80   Pulse 80   Temp (!) 96.7 F (35.9 C)   Resp 16   Ht 5\' 6"  (1.676 m)   Wt 148 lb 6.4 oz (67.3 kg)   BMI 23.95 kg/m   HEENT - WNL. Neck - supple.  Chest - Clear equal BS. Cor - Nl HS. RRR w/o sig MGR. PP 1(+). No edema. MS-  FROM w/o deformities.  Gait Nl. Bilat hip ROM - Normal and Negative SLR.  No bursal tenderness. Neuro -  Nl w/o focal abnormalities. Skin - 3-4 mm raised filamentous crusty lesion at superior Lt ear helix.   Procedure (CPT - 17000)     After informed consent, aseptic prep with alcohol and local anesthesia with 0.3 ml  Marcaine 0.5%, the lesion was sharply debrided and then deeply hyfrecated for hemostasis & electrodestruction of residual lesion suspected to be a verruca. Sterile band aid applied & patient instructed in post-op care   Assessment and Plan:  1. Sciatica of left side  - dexamethasone 4 MG tablet; Take 1 tab 3 x day - 3 days, then 2 x day - 3 days, then 1 tab daily  Disp: 20 tablet  - gabapentin  100 MG capsule; Take 1 to 3 capsules at Bedtime   for Sciatica  Disp: 90 capsule  2. Neoplasm of uncertain behavior of skin of   Lt ear  - Lesion excised  Follow Up Instructions:      I discussed the assessment and treatment plan with the patient. The patient was provided an opportunity to ask questions and all were answered. The patient agreed with the plan and demonstrated an understanding of the instructions.  The patient was advised to call back or seek an in-person evaluation if the symptoms worsen or if the condition fails to improve as anticipated. ,advised patient if  sx's persist call to have MRI scheduled   Kirtland Bouchard, MD

## 2019-11-09 ENCOUNTER — Telehealth: Payer: Self-pay | Admitting: *Deleted

## 2019-11-09 NOTE — Telephone Encounter (Signed)
Patient called and taking Gabapentin 100 mg at bedtime and reports fatigue. States he is no longer having pain. Per Dr Melford Aase, can stop the Gabapentin and use as needed, since he is having no pain. Patient is aware.

## 2019-11-09 NOTE — Telephone Encounter (Signed)
Patient called ad reported he has been taking Gabapentin 100 mg

## 2019-11-15 ENCOUNTER — Other Ambulatory Visit: Payer: Self-pay

## 2019-11-15 ENCOUNTER — Encounter: Payer: Self-pay | Admitting: Physician Assistant

## 2019-11-15 ENCOUNTER — Ambulatory Visit (INDEPENDENT_AMBULATORY_CARE_PROVIDER_SITE_OTHER): Payer: Medicare HMO | Admitting: Physician Assistant

## 2019-11-15 VITALS — BP 130/74 | HR 85 | Temp 97.5°F | Wt 145.0 lb

## 2019-11-15 DIAGNOSIS — R21 Rash and other nonspecific skin eruption: Secondary | ICD-10-CM | POA: Diagnosis not present

## 2019-11-15 DIAGNOSIS — Z79899 Other long term (current) drug therapy: Secondary | ICD-10-CM | POA: Diagnosis not present

## 2019-11-15 DIAGNOSIS — E8809 Other disorders of plasma-protein metabolism, not elsewhere classified: Secondary | ICD-10-CM

## 2019-11-15 DIAGNOSIS — E538 Deficiency of other specified B group vitamins: Secondary | ICD-10-CM

## 2019-11-15 DIAGNOSIS — L308 Other specified dermatitis: Secondary | ICD-10-CM

## 2019-11-15 DIAGNOSIS — B353 Tinea pedis: Secondary | ICD-10-CM

## 2019-11-15 DIAGNOSIS — D696 Thrombocytopenia, unspecified: Secondary | ICD-10-CM

## 2019-11-15 MED ORDER — TERBINAFINE HCL 250 MG PO TABS: 250.0000 mg | ORAL_TABLET | Freq: Every day | ORAL | 0 refills | Status: DC

## 2019-11-15 NOTE — Progress Notes (Signed)
Subjective:    Patient ID: Matthew Aguirre, male    DOB: 07-01-1933, 86 y.o.   MRN: [id number]  HPI 86 y.o. WM with history of HTN, chol, pulmonary sarcoidosis, GERD and prostate cancer (2014), recently completed 40 rounds of radiation with Dr. Tresa Moore for gleason 8 cancer. He initiated on Eligard on Feb 2021, just had his last shot August 2nd presents for rash on bilateral feet. '  He noticed last night on bilateral feet a rash when about to get in the shower. No itching, pain with the rash, he still has his bilateral leg numbness/tingling, worse on the left.  He has some bilateral ankle swelling/stiffness. He has some left hip stiffness/pain.   No fever, no chills. No nasal issues, some sore throat, no SOB, no CP, no diarrhea, no constipation.   Just did dexamethasone treatment 10/31/2019, last pill last Sunday. He was given prednisone for left hip on 10/24/2019.  He did take a few new gabapentin.   Lab Results  Component Value Date   CREATININE 0.99 10/02/2019   BUN 18 10/02/2019   NA 141 10/02/2019   K 4.4 10/02/2019   CL 104 10/02/2019   CO2 30 10/02/2019     Blood pressure 130/74, pulse 85, temperature (!) 97.5 F (36.4 C), weight 145 lb (65.8 kg), SpO2 97 %.  Medications   Current Outpatient Medications (Cardiovascular):  .  bisoprolol-hydrochlorothiazide (ZIAC) 10-6.25 MG tablet, Take 1 tablet Daily for BP   Current Outpatient Medications (Analgesics):  .  acetaminophen (TYLENOL) 500 MG tablet, Take 500 mg by mouth as needed (prn).  Marland Kitchen  aspirin 81 MG EC tablet, Take by mouth.   Current Outpatient Medications (Other):  Marland Kitchen  Calcium Carbonate (CALCIUM 600 PO), Take by mouth daily. .  Cholecalciferol (VITAMIN D PO), Take 2,000 Int'l Units by mouth 2 (two) times daily.  .  famotidine (PEPCID) 20 MG tablet, Take 20 mg by mouth at bedtime. .  Flaxseed, Linseed, (FLAX SEED OIL PO), Take 1,200 mg by mouth 3 (three) times daily.  Marland Kitchen  gabapentin (NEURONTIN) 100 MG capsule,  Take 1 to 3 capsules    at Bedtime     for Sciatica .  leuprolide, 6 Month, (ELIGARD) 45 MG injection, Inject 45 mg into the skin every 6 (six) months. .  Magnesium 250 MG TABS, Take 250 mg by mouth daily. .  Omega-3 Fatty Acids (FISH OIL PO), Take 1,200 mg by mouth 2 (two) times daily.  .  pantoprazole (PROTONIX) 40 MG tablet, Take 1 tablet every Morning for Indigestion & Heartburn  Problem list He has Essential hypertension; Hyperlipidemia; Vitamin D deficiency; Medication management; Malignant neoplasm of prostate ([place]); Pulmonary sarcoidosis (1988) ; Other abnormal glucose; BMI 23.0-23.9, adult; Plantar fasciitis of left foot; Gastroesophageal reflux disease without esophagitis; and Aortic atherosclerosis (HCC) on their problem list.   Review of Systems     Objective:   Physical Exam Constitutional:      General: He is not in acute distress.    Appearance: Normal appearance. He is not ill-appearing.  HENT:     Right Ear: Tympanic membrane normal.     Left Ear: Tympanic membrane normal.     Nose: Nose normal.     Mouth/Throat:     Mouth: Mucous membranes are moist.     Pharynx: No oropharyngeal exudate or posterior oropharyngeal erythema.  Cardiovascular:     Pulses: Normal pulses.     Heart sounds: Normal heart sounds.  Pulmonary:  Effort: Pulmonary effort is normal.     Breath sounds: Normal breath sounds.  Abdominal:     General: Abdomen is flat.     Palpations: Abdomen is soft.  Musculoskeletal:        General: Tenderness present.     Comments: Bilateral ankles with mild swelling and stiffness with plantar flexion.  Skin:    General: Skin is warm.     Findings: Rash present.     Comments: Annular erythematous rash along bilateral feet, no warmth, non tender, + blanchable.   Neurological:     Mental Status: He is alert and oriented to person, place, and time.                 Assessment & Plan:   Tinea pedis of both feet/annular dermatitis/?  autoimmune -     terbinafine (LAMISIL) 250 MG tablet; Take 1 tablet (250 mg total) by mouth daily. Take 1 pill daily for fungal infection With recent multiple rounds of prednisone will treat as a fungal rash However will do close follow up and if not better may need biopsy/autoimmune labs Patient will call if any worsening symptoms, any new symptoms, or if the rash is spreading.  -     Sedimentation rate -     C-reactive protein -     CBC with Differential/Platelet -     COMPLETE METABOLIC PANEL WITH GFR  C80 deficiency -     Vitamin B12

## 2019-11-16 LAB — CBC WITH DIFFERENTIAL/PLATELET
Absolute Monocytes: 672 cells/uL (ref 200–950)
Basophils Absolute: 41 cells/uL (ref 0–200)
Basophils Relative: 0.5 %
Eosinophils Absolute: 16 cells/uL (ref 15–500)
Eosinophils Relative: 0.2 %
HCT: 45.9 % (ref 38.5–50.0)
Hemoglobin: 15.6 g/dL (ref 13.2–17.1)
Lymphs Abs: 328 cells/uL — ABNORMAL LOW (ref 850–3900)
MCH: 31.6 pg (ref 27.0–33.0)
MCHC: 34 g/dL (ref 32.0–36.0)
MCV: 93.1 fL (ref 80.0–100.0)
MPV: 11 fL (ref 7.5–12.5)
Monocytes Relative: 8.2 %
Neutro Abs: 7142 cells/uL (ref 1500–7800)
Neutrophils Relative %: 87.1 %
Platelets: 109 10*3/uL — ABNORMAL LOW (ref 140–400)
RBC: 4.93 10*6/uL (ref 4.20–5.80)
RDW: 12.5 % (ref 11.0–15.0)
Total Lymphocyte: 4 %
WBC: 8.2 10*3/uL (ref 3.8–10.8)

## 2019-11-16 LAB — COMPLETE METABOLIC PANEL WITH GFR
AG Ratio: 2.3 (calc) (ref 1.0–2.5)
ALT: 53 U/L — ABNORMAL HIGH (ref 9–46)
AST: 18 U/L (ref 10–35)
Albumin: 3.6 g/dL (ref 3.6–5.1)
Alkaline phosphatase (APISO): 53 U/L (ref 35–144)
BUN: 24 mg/dL (ref 7–25)
CO2: 28 mmol/L (ref 20–32)
Calcium: 8.7 mg/dL (ref 8.6–10.3)
Chloride: 103 mmol/L (ref 98–110)
Creat: 1.05 mg/dL (ref 0.70–1.11)
GFR, Est African American: 75 mL/min/{1.73_m2} (ref >=60)
GFR, Est Non African American: 65 mL/min/{1.73_m2} (ref >=60)
Globulin: 1.6 g/dL (calc) — ABNORMAL LOW (ref 1.9–3.7)
Glucose, Bld: 81 mg/dL (ref 65–99)
Potassium: 3.8 mmol/L (ref 3.5–5.3)
Sodium: 138 mmol/L (ref 135–146)
Total Bilirubin: 0.6 mg/dL (ref 0.2–1.2)
Total Protein: 5.2 g/dL — ABNORMAL LOW (ref 6.1–8.1)

## 2019-11-16 LAB — C-REACTIVE PROTEIN: CRP: 4.9 mg/L (ref <8.0)

## 2019-11-16 LAB — SEDIMENTATION RATE: Sed Rate: 2 mm/h (ref 0–20)

## 2019-11-16 LAB — VITAMIN B12: Vitamin B-12: 440 pg/mL (ref 200–1100)

## 2019-11-16 NOTE — Addendum Note (Signed)
Addended by: Vicie Mutters R on: 11/16/2019 09:07 AM   Modules accepted: Orders

## 2019-11-21 NOTE — Progress Notes (Signed)
Subjective:    Patient ID: Matthew Aguirre, male    DOB: [birth year], 86 y.o.   MRN: [id number]  HPI 86 y.o. WM with history of HTN, chol, questionable pulmonary sarcoidosis 1988, GERD and prostate cancer (2014) presents for 1 week follow up.  Last CXR 07/2014 He was seen in the office on 11/15/2019 for bilateral feet rash with bilateral ankle swelling/stiffiness, his labs were abnormal last visit  Liver function was up slightly, his total protein was down, and he had a new thrombocytopenia. He was referred to derm but is here for close follow up.  He has had numbness/tingling bilateral legs, left worse than right.   He had tick exposure in the spring.  Had similar rash 08/2016 per patient.   Lab Results  Component Value Date   WBC 8.2 11/15/2019   HGB 15.6 11/15/2019   HCT 45.9 11/15/2019   MCV 93.1 11/15/2019   PLT 109 (L) 11/15/2019   Lab Results  Component Value Date   ALT 53 (H) 11/15/2019   AST 18 11/15/2019   ALKPHOS 61 07/13/2016   BILITOT 0.6 11/15/2019    Blood pressure 136/82, pulse 67, temperature 97.6 F (36.4 C), weight 149 lb (67.6 kg), SpO2 95 %.  Medications   Current Outpatient Medications (Cardiovascular):  .  bisoprolol-hydrochlorothiazide (ZIAC) 10-6.25 MG tablet, Take 1 tablet Daily for BP   Current Outpatient Medications (Analgesics):  .  acetaminophen (TYLENOL) 500 MG tablet, Take 500 mg by mouth as needed (prn).  Marland Kitchen  aspirin 81 MG EC tablet, Take by mouth.   Current Outpatient Medications (Other):  Marland Kitchen  Calcium Carbonate (CALCIUM 600 PO), Take by mouth daily. .  Cholecalciferol (VITAMIN D PO), Take 2,000 Int'l Units by mouth 2 (two) times daily.  .  famotidine (PEPCID) 20 MG tablet, Take 20 mg by mouth at bedtime. .  Flaxseed, Linseed, (FLAX SEED OIL PO), Take 1,200 mg by mouth 3 (three) times daily.  Marland Kitchen  gabapentin (NEURONTIN) 100 MG capsule, Take 1 to 3 capsules    at Bedtime     for Sciatica .  leuprolide, 6 Month, (ELIGARD) 45 MG  injection, Inject 45 mg into the skin every 6 (six) months. .  Magnesium 250 MG TABS, Take 250 mg by mouth daily. .  Omega-3 Fatty Acids (FISH OIL PO), Take 1,200 mg by mouth 2 (two) times daily.  .  pantoprazole (PROTONIX) 40 MG tablet, Take 1 tablet every Morning for Indigestion & Heartburn .  terbinafine (LAMISIL) 250 MG tablet, Take 1 tablet (250 mg total) by mouth daily. Take 1 pill daily for fungal infection  Problem list He has Essential hypertension; Hyperlipidemia; Vitamin D deficiency; Medication management; Malignant neoplasm of prostate ([place]); Pulmonary sarcoidosis (1988) ; Other abnormal glucose; BMI 23.0-23.9, adult; Plantar fasciitis of left foot; Gastroesophageal reflux disease without esophagitis; and Aortic atherosclerosis (HCC) on their problem list.   Review of Systems     Objective:   Physical Exam Constitutional:      General: He is not in acute distress.    Appearance: Normal appearance. He is not ill-appearing.  HENT:     Right Ear: Tympanic membrane normal.     Left Ear: Tympanic membrane normal.     Nose: Nose normal.     Mouth/Throat:     Mouth: Mucous membranes are moist.     Pharynx: No oropharyngeal exudate or posterior oropharyngeal erythema.  Cardiovascular:     Pulses: Normal pulses.     Heart sounds: Normal heart  sounds.  Pulmonary:     Effort: Pulmonary effort is normal.     Breath sounds: Normal breath sounds.  Abdominal:     General: Abdomen is flat.     Palpations: Abdomen is soft.  Musculoskeletal:        General: Tenderness present.     Comments: Bilateral ankles with mild swelling and stiffness with plantar flexion.  Skin:    General: Skin is warm.     Findings: Rash present.     Comments: Annular erythematous rash along bilateral feet and left hip, no warmth, non tender, + blanchable.   Neurological:     Mental Status: He is alert and oriented to person, place, and time.                 Assessment & Plan:    Hypoalbuminemia -     Complement, total -     C3 and C4  Rash of both feet Continue creams Follow up derm Check labs -     Complement, total -     C3 and C4 -     Rocky mtn spotted fvr abs pnl(IgG+IgM) -     B. burgdorfi antibodies  Elevated LFTs -     COMPLETE METABOLIC PANEL WITH GFR -     Gamma GT  Thrombocytopenia (HCC) -     CBC with Differential/Platelet  Screening for hematuria or proteinuria -     Urinalysis, Routine w reflex microscopic -     Microalbumin / creatinine urine ratio  The patient was advised to call immediately if he has any concerning symptoms in the interval. The patient voices understanding of current treatment options and is in agreement with the current care plan.The patient knows to call the clinic with any problems, questions or concerns or go to the ER if any further progression of symptoms.

## 2019-11-22 ENCOUNTER — Ambulatory Visit (INDEPENDENT_AMBULATORY_CARE_PROVIDER_SITE_OTHER): Payer: Medicare HMO | Admitting: Physician Assistant

## 2019-11-22 ENCOUNTER — Other Ambulatory Visit: Payer: Self-pay

## 2019-11-22 ENCOUNTER — Encounter: Payer: Self-pay | Admitting: Physician Assistant

## 2019-11-22 VITALS — BP 136/82 | HR 67 | Temp 97.6°F | Wt 149.0 lb

## 2019-11-22 DIAGNOSIS — R7989 Other specified abnormal findings of blood chemistry: Secondary | ICD-10-CM | POA: Diagnosis not present

## 2019-11-22 DIAGNOSIS — Z1389 Encounter for screening for other disorder: Secondary | ICD-10-CM

## 2019-11-22 DIAGNOSIS — D696 Thrombocytopenia, unspecified: Secondary | ICD-10-CM

## 2019-11-22 DIAGNOSIS — R21 Rash and other nonspecific skin eruption: Secondary | ICD-10-CM | POA: Diagnosis not present

## 2019-11-22 DIAGNOSIS — E8809 Other disorders of plasma-protein metabolism, not elsewhere classified: Secondary | ICD-10-CM

## 2019-11-23 LAB — URINALYSIS, ROUTINE W REFLEX MICROSCOPIC
Bilirubin Urine: NEGATIVE
Glucose, UA: NEGATIVE
Hgb urine dipstick: NEGATIVE
Ketones, ur: NEGATIVE
Leukocytes,Ua: NEGATIVE
Nitrite: NEGATIVE
Protein, ur: NEGATIVE
Specific Gravity, Urine: 1.013 (ref 1.001–1.03)
pH: 6.5 (ref 5.0–8.0)

## 2019-11-23 LAB — ROCKY MTN SPOTTED FVR ABS PNL(IGG+IGM)
RMSF IgG: NOT DETECTED
RMSF IgM: NOT DETECTED

## 2019-11-23 LAB — COMPLETE METABOLIC PANEL WITH GFR
AG Ratio: 1.9 (calc) (ref 1.0–2.5)
ALT: 32 U/L (ref 9–46)
AST: 18 U/L (ref 10–35)
Albumin: 3.7 g/dL (ref 3.6–5.1)
Alkaline phosphatase (APISO): 55 U/L (ref 35–144)
BUN/Creatinine Ratio: 14 (calc) (ref 6–22)
BUN: 16 mg/dL (ref 7–25)
CO2: 32 mmol/L (ref 20–32)
Calcium: 9.4 mg/dL (ref 8.6–10.3)
Chloride: 100 mmol/L (ref 98–110)
Creat: 1.12 mg/dL — ABNORMAL HIGH (ref 0.70–1.11)
GFR, Est African American: 70 mL/min/{1.73_m2} (ref >=60)
GFR, Est Non African American: 60 mL/min/{1.73_m2} (ref >=60)
Globulin: 1.9 g/dL (calc) (ref 1.9–3.7)
Glucose, Bld: 99 mg/dL (ref 65–99)
Potassium: 4.6 mmol/L (ref 3.5–5.3)
Sodium: 138 mmol/L (ref 135–146)
Total Bilirubin: 0.5 mg/dL (ref 0.2–1.2)
Total Protein: 5.6 g/dL — ABNORMAL LOW (ref 6.1–8.1)

## 2019-11-23 LAB — CBC WITH DIFFERENTIAL/PLATELET
Absolute Monocytes: 429 cells/uL (ref 200–950)
Basophils Absolute: 19 cells/uL (ref 0–200)
Basophils Relative: 0.5 %
Eosinophils Absolute: 19 cells/uL (ref 15–500)
Eosinophils Relative: 0.5 %
HCT: 41.8 % (ref 38.5–50.0)
Hemoglobin: 14 g/dL (ref 13.2–17.1)
Lymphs Abs: 311 cells/uL — ABNORMAL LOW (ref 850–3900)
MCH: 31.4 pg (ref 27.0–33.0)
MCHC: 33.5 g/dL (ref 32.0–36.0)
MCV: 93.7 fL (ref 80.0–100.0)
MPV: 10.9 fL (ref 7.5–12.5)
Monocytes Relative: 11.6 %
Neutro Abs: 2923 cells/uL (ref 1500–7800)
Neutrophils Relative %: 79 %
Platelets: 120 10*3/uL — ABNORMAL LOW (ref 140–400)
RBC: 4.46 10*6/uL (ref 4.20–5.80)
RDW: 12 % (ref 11.0–15.0)
Total Lymphocyte: 8.4 %
WBC: 3.7 10*3/uL — ABNORMAL LOW (ref 3.8–10.8)

## 2019-11-23 LAB — C3 AND C4
C3 Complement: 153 mg/dL
C4 Complement: 36 mg/dL

## 2019-11-23 LAB — COMPLEMENT, TOTAL: Compl, Total (CH50): >60 U/mL — ABNORMAL HIGH (ref 31–60)

## 2019-11-23 LAB — MICROALBUMIN / CREATININE URINE RATIO
Creatinine, Urine: 70 mg/dL (ref 20–320)
Microalb Creat Ratio: 7 mcg/mg creat (ref <30)
Microalb, Ur: 0.5 mg/dL

## 2019-11-23 LAB — B. BURGDORFI ANTIBODIES: B burgdorferi Ab IgG+IgM: <0.9 index

## 2019-12-06 ENCOUNTER — Ambulatory Visit (INDEPENDENT_AMBULATORY_CARE_PROVIDER_SITE_OTHER): Payer: Medicare HMO | Admitting: Physician Assistant

## 2019-12-06 ENCOUNTER — Encounter: Payer: Self-pay | Admitting: Physician Assistant

## 2019-12-06 ENCOUNTER — Other Ambulatory Visit: Payer: Self-pay

## 2019-12-06 VITALS — BP 136/80 | HR 68 | Temp 97.8°F | Wt 147.0 lb

## 2019-12-06 DIAGNOSIS — M5432 Sciatica, left side: Secondary | ICD-10-CM | POA: Diagnosis not present

## 2019-12-06 NOTE — Patient Instructions (Signed)
Can take the gabapentin 300mg  at night.  Take it 1-3 hours before bed  It can make you sleepy so we suggest trying it at night first and please plan to not drive or do anything strenuous.  Also please do not take this medication with alcohol.   Start out 1 pill at night before bed, can increase to 2 pills at night before bed. Please call the office if you have any side effects.   Can take 3 pills a day total however you would like  Some examples: - 1 breakfast, lunch, bedtime. - 1 at breakfast, 2 at bed time  Common side effects are sleepiness, concentration problems, dizziness, swelling.  Do not stop abruptly unless you have a reaction to it.   BACK PAIN  Try the exercises and other information in the back care manual.   Go to the ER if you have any new weakness in your legs, have trouble controlling your urine or bowels, or have worsening pain.    Back pain Rehab Ask your health care provider which exercises are safe for you. Do exercises exactly as told by your health care provider and adjust them as directed. It is normal to feel mild stretching, pulling, tightness, or discomfort as you do these exercises, but you should stop right away if you feel sudden pain or your pain gets worse. Do not begin these exercises until told by your health care provider. Stretching and range of motion exercises These exercises warm up your muscles and joints and improve the movement and flexibility of your hips and your back. These exercises also help to relieve pain, numbness, and tingling. Exercise A: Sciatic nerve glide 1. Sit in a chair with your head facing down toward your chest. Place your hands behind your back. Let your shoulders slump forward. 2. Slowly straighten one of your knees while you tilt your head back as if you are looking toward the ceiling. Only straighten your leg as far as you can without making your symptoms worse. 3. Hold for __________ seconds. 4. Slowly return to the  starting position. 5. Repeat with your other leg. Repeat __________ times. Complete this exercise __________ times a day. Exercise B: Knee to chest with hip adduction and internal rotation  1. Lie on your back on a firm surface with both legs straight. 2. Bend one of your knees and move it up toward your chest until you feel a gentle stretch in your lower back and buttock. Then, move your knee toward the shoulder that is on the opposite side from your leg. ? Hold your leg in this position by holding onto the front of your knee. 3. Hold for __________ seconds. 4. Slowly return to the starting position. 5. Repeat with your other leg. Repeat __________ times. Complete this exercise __________ times a day. Exercise C: Prone extension on elbows  1. Lie on your abdomen on a firm surface. A bed may be too soft for this exercise. 2. Prop yourself up on your elbows. 3. Use your arms to help lift your chest up until you feel a gentle stretch in your abdomen and your lower back. ? This will place some of your body weight on your elbows. If this is uncomfortable, try stacking pillows under your chest. ? Your hips should stay down, against the surface that you are lying on. Keep your hip and back muscles relaxed. 4. Hold for __________ seconds. 5. Slowly relax your upper body and return to the starting position. Repeat  __________ times. Complete this exercise __________ times a day. Strengthening exercises These exercises build strength and endurance in your back. Endurance is the ability to use your muscles for a long time, even after they get tired. Exercise D: Pelvic tilt 1. Lie on your back on a firm surface. Bend your knees and keep your feet flat. 2. Tense your abdominal muscles. Tip your pelvis up toward the ceiling and flatten your lower back into the floor. ? To help with this exercise, you may place a small towel under your lower back and try to push your back into the towel. 3. Hold for  __________ seconds. 4. Let your muscles relax completely before you repeat this exercise. Repeat __________ times. Complete this exercise __________ times a day. Exercise E: Alternating arm and leg raises  1. Get on your hands and knees on a firm surface. If you are on a hard floor, you may want to use padding to cushion your knees, such as an exercise mat. 2. Line up your arms and legs. Your hands should be below your shoulders, and your knees should be below your hips. 3. Lift your left leg behind you. At the same time, raise your right arm and straighten it in front of you. ? Do not lift your leg higher than your hip. ? Do not lift your arm higher than your shoulder. ? Keep your abdominal and back muscles tight. ? Keep your hips facing the ground. ? Do not arch your back. ? Keep your balance carefully, and do not hold your breath. 4. Hold for __________ seconds. 5. Slowly return to the starting position and repeat with your right leg and your left arm. Repeat __________ times. Complete this exercise __________ times a day. Posture and body mechanics  Body mechanics refers to the movements and positions of your body while you do your daily activities. Posture is part of body mechanics. Good posture and healthy body mechanics can help to relieve stress in your body's tissues and joints. Good posture means that your spine is in its natural S-curve position (your spine is neutral), your shoulders are pulled back slightly, and your head is not tipped forward. The following are general guidelines for applying improved posture and body mechanics to your everyday activities. Standing   When standing, keep your spine neutral and your feet about hip-width apart. Keep a slight bend in your knees. Your ears, shoulders, and hips should line up.  When you do a task in which you stand in one place for a long time, place one foot up on a stable object that is 2-4 inches (5-10 cm) high, such as a  footstool. This helps keep your spine neutral. Sitting   When sitting, keep your spine neutral and keep your feet flat on the floor. Use a footrest, if necessary, and keep your thighs parallel to the floor. Avoid rounding your shoulders, and avoid tilting your head forward.  When working at a desk or a computer, keep your desk at a height where your hands are slightly lower than your elbows. Slide your chair under your desk so you are close enough to maintain good posture.  When working at a computer, place your monitor at a height where you are looking straight ahead and you do not have to tilt your head forward or downward to look at the screen. Resting   When lying down and resting, avoid positions that are most painful for you.  If you have pain with activities such  as sitting, bending, stooping, or squatting (flexion-based activities), lie in a position in which your body does not bend very much. For example, avoid curling up on your side with your arms and knees near your chest (fetal position).  If you have pain with activities such as standing for a long time or reaching with your arms (extension-based activities), lie with your spine in a neutral position and bend your knees slightly. Try the following positions: ? Lying on your side with a pillow between your knees. ? Lying on your back with a pillow under your knees. Lifting   When lifting objects, keep your feet at least shoulder-width apart and tighten your abdominal muscles.  Bend your knees and hips and keep your spine neutral. It is important to lift using the strength of your legs, not your back. Do not lock your knees straight out.  Always ask for help to lift heavy or awkward objects. This information is not intended to replace advice given to you by your health care provider. Make sure you discuss any questions you have with your health care provider. Document Released: 03/02/2005 Document Revised: 11/07/2015 Document  Reviewed: 11/16/2014 Elsevier Interactive Patient Education  [employer].

## 2019-12-06 NOTE — Progress Notes (Signed)
Subjective:    Patient ID: Matthew Aguirre, male    DOB: 1933-11-03, 86 y.o.   MRN: [id number]  HPI 86 y.o. 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 Tresa Moore for gleason 8 cancer. He initiated on Eligard on Feb 2021 presents for follow up.  He has been followed recently for rash bilateral feet and hypoalbuminemia, treated with terminafine and is improving. Has appointment with dermatology In oct.   He was seen for back pain and left hip/leg pain on 10/02/2019 for medicare visit, described leg muscle cramps, had normal CBC, normal iron. He was then seen 10/24/19 given prednisone taper and on 10/31/19 given decadron and gabapentin.  States that pain is getting progressively worse.  He had a bone scan that was negative 03/2019, he had MRI pelvic 06/2019.  The gabapentin causes fatigue and dizziness, he did take it the gabapentin last night at 930 when he was going to bed and it helped potentially through the night.   Over the weekend he started to have numbness not just at his foot but numbness all the way down his leg with cramping/tightening in his leg,, worse after lying down at night or staying still during the day, he will have to get up and walk around to go back to sleep. Happening every 2-4 hours.  He has left hip pain, pinpoint pain with pushing on it. Constant, worse in any position, constant dull ache.   He walks a mile a day in the morning, takes him about 20 mins. He has started to use a walking stick due to imbalance, feeling wobbly. Feels like it is dragging sometimes.   Patient denies fever, hematuria, incontinence and saddle anesthesia    Blood pressure 136/80, pulse 68, temperature 97.8 F (36.6 C), weight 147 lb (66.7 kg), SpO2 98 %.  Medications   Current Outpatient Medications (Cardiovascular):  .  bisoprolol-hydrochlorothiazide (ZIAC) 10-6.25 MG tablet,  Take 1 tablet Daily for BP   Current Outpatient Medications (Analgesics):  .  acetaminophen (TYLENOL) 500 MG tablet, Take 500 mg by mouth as needed (prn).  Marland Kitchen  aspirin 81 MG EC tablet, Take by mouth.   Current Outpatient Medications (Other):  Marland Kitchen  Calcium Carbonate (CALCIUM 600 PO), Take by mouth daily. .  Cholecalciferol (VITAMIN D PO), Take 2,000 Int'l Units by mouth 2 (two) times daily.  .  famotidine (PEPCID) 20 MG tablet, Take 20 mg by mouth at bedtime. .  Flaxseed, Linseed, (FLAX SEED OIL PO), Take 1,200 mg by mouth 3 (three) times daily.  Marland Kitchen  gabapentin (NEURONTIN) 100 MG capsule, Take 1 to 3 capsules    at Bedtime     for Sciatica .  leuprolide, 6 Month, (ELIGARD) 45 MG injection, Inject 45 mg into the skin every 6 (six) months. .  Magnesium 250 MG TABS, Take 250 mg by mouth daily. .  Omega-3 Fatty Acids (FISH OIL PO), Take 1,200 mg by mouth 2 (two) times daily.  .  pantoprazole (PROTONIX) 40 MG tablet, Take 1 tablet every Morning for Indigestion & Heartburn .  terbinafine (LAMISIL) 250 MG tablet, Take 1 tablet (250 mg total) by mouth daily. Take 1 pill daily for fungal infection  Problem list He has Essential hypertension; Hyperlipidemia; Vitamin D deficiency; Medication management; Malignant neoplasm of prostate ([place]); Pulmonary sarcoidosis (1988) ; Other abnormal glucose; BMI 23.0-23.9, adult; Plantar fasciitis of left foot; Gastroesophageal reflux disease without esophagitis; and  Aortic atherosclerosis ([facility]) on their problem list.  Review of Systems  Constitutional: Positive for fatigue. Negative for chills, fever and unexpected weight change.  HENT: Negative.   Respiratory: Negative.   Cardiovascular: Positive for leg swelling. Negative for chest pain and palpitations.  Gastrointestinal: Negative.   Musculoskeletal: Positive for back pain and gait problem. Negative for arthralgias, myalgias, neck pain and neck stiffness.  Skin: Positive for rash.  Neurological: Positive for  numbness. Negative for dizziness, tremors, seizures, syncope, facial asymmetry, speech difficulty, weakness, light-headedness and headaches.  Psychiatric/Behavioral: Negative.        Objective:   Physical Exam Constitutional:      General: He is not in acute distress.    Appearance: Normal appearance. He is not ill-appearing.  HENT:     Right Ear: Tympanic membrane normal.     Left Ear: Tympanic membrane normal.     Nose: Nose normal.     Mouth/Throat:     Mouth: Mucous membranes are moist.     Pharynx: No oropharyngeal exudate or posterior oropharyngeal erythema.  Cardiovascular:     Pulses: Normal pulses.     Heart sounds: Normal heart sounds.  Pulmonary:     Effort: Pulmonary effort is normal.     Breath sounds: Normal breath sounds.  Abdominal:     General: Abdomen is flat.     Palpations: Abdomen is soft.  Musculoskeletal:        General: Tenderness present.     Comments: Patient is able to ambulate well. Gait is antalgic with obvious left leg limp. Straight leg raising with dorsiflexion positive bilaterally for radicular symptoms. Sensory exam in the legs are abnormal left leg L5 distribution. Knee reflexes are normal Ankle reflexes are abnormal decreased. Strength is abnormal left leg slightly decreased. There is not SI tenderness to palpation.  There is not paraspinal muscle spasm.  There is not midline tenderness.  ROM of spine with  limited in all spheres due to pain.  Bilateral ankles with mild swelling and stiffness with plantar flexion.  Skin:    General: Skin is warm.     Findings: Rash present.     Comments: Annular erythematous rash along bilateral feet and left hip, no warmth, non tender, fading and becoming less prominent  Neurological:     Mental Status: He is alert and oriented to person, place, and time.       Assessment & Plan:  Maureen was seen today for follow-up.  Diagnoses and all orders for this visit:  Sciatica of left side + abnormal exam with  + straight leg raise, + decreased sensation L5 distribution left leg with decreased reflexes.  No issues with urination, no fever, chills or signs of cauda equina or infection Will proceed with MRI/PT and referral to neurosurgery Has had 2 rounds of prednisone without help Will continue gabapentin at night -     MR Lumbar Spine Wo Contrast; Future -     Ambulatory referral to Physical Therapy -     Ambulatory referral to Neurosurgery  Go to the ER if you have any new weakness in your legs, have trouble controlling your urine or bowels, or have worsening pain.

## 2019-12-13 ENCOUNTER — Ambulatory Visit (HOSPITAL_COMMUNITY)
Admission: RE | Admit: 2019-12-13 | Discharge: 2019-12-13 | Disposition: A | Discharge status: Home / Self Care | Payer: Medicare HMO | Source: Ambulatory Visit | Attending: Physician Assistant | Admitting: Physician Assistant

## 2019-12-13 ENCOUNTER — Other Ambulatory Visit: Payer: Self-pay

## 2019-12-13 DIAGNOSIS — M5432 Sciatica, left side: Secondary | ICD-10-CM

## 2019-12-13 DIAGNOSIS — M545 Low back pain: Secondary | ICD-10-CM | POA: Diagnosis not present

## 2019-12-18 DIAGNOSIS — R030 Elevated blood-pressure reading, without diagnosis of hypertension: Secondary | ICD-10-CM | POA: Diagnosis not present

## 2019-12-18 DIAGNOSIS — M5417 Radiculopathy, lumbosacral region: Secondary | ICD-10-CM | POA: Diagnosis not present

## 2019-12-18 DIAGNOSIS — M21372 Foot drop, left foot: Secondary | ICD-10-CM | POA: Diagnosis not present

## 2019-12-18 DIAGNOSIS — C61 Malignant neoplasm of prostate: Secondary | ICD-10-CM | POA: Diagnosis not present

## 2019-12-19 ENCOUNTER — Other Ambulatory Visit: Payer: Self-pay | Admitting: Internal Medicine

## 2019-12-19 DIAGNOSIS — K219 Gastro-esophageal reflux disease without esophagitis: Secondary | ICD-10-CM

## 2019-12-26 ENCOUNTER — Ambulatory Visit (INDEPENDENT_AMBULATORY_CARE_PROVIDER_SITE_OTHER): Payer: Medicare HMO | Admitting: Adult Health Nurse Practitioner

## 2019-12-26 ENCOUNTER — Other Ambulatory Visit: Payer: Self-pay

## 2019-12-26 ENCOUNTER — Encounter: Payer: Self-pay | Admitting: Adult Health Nurse Practitioner

## 2019-12-26 ENCOUNTER — Telehealth: Payer: Self-pay

## 2019-12-26 ENCOUNTER — Ambulatory Visit: Payer: Medicare HMO | Attending: Physician Assistant

## 2019-12-26 VITALS — BP 160/88 | HR 120 | Temp 97.3°F | Wt 146.0 lb

## 2019-12-26 DIAGNOSIS — M21372 Foot drop, left foot: Secondary | ICD-10-CM | POA: Diagnosis not present

## 2019-12-26 DIAGNOSIS — M6281 Muscle weakness (generalized): Secondary | ICD-10-CM | POA: Insufficient documentation

## 2019-12-26 DIAGNOSIS — I10 Essential (primary) hypertension: Secondary | ICD-10-CM | POA: Diagnosis not present

## 2019-12-26 DIAGNOSIS — R000 Tachycardia, unspecified: Secondary | ICD-10-CM

## 2019-12-26 DIAGNOSIS — M5432 Sciatica, left side: Secondary | ICD-10-CM | POA: Diagnosis not present

## 2019-12-26 MED ORDER — METOPROLOL TARTRATE 50 MG PO TABS: 50.0000 mg | ORAL_TABLET | Freq: Two times a day (BID) | ORAL | 1 refills | Status: DC

## 2019-12-26 NOTE — Telephone Encounter (Signed)
Spoke with patient. Pulse is still elevated and transferred up front for appointment.

## 2019-12-26 NOTE — Therapy (Signed)
[place], [place], [address] Phone: [phone]   Fax:  [phone]  Physical Therapy Evaluation  Patient Details  Name: Matthew Aguirre MRN: [id number] Date of Birth: 01-Nov-1933 Referring Provider (PT): Vicie Mutters, [place]    Encounter Date: 12/26/2019   PT End of Session - 12/26/19 1329    Visit Number 1    Number of Visits 9    Date for PT Re-Evaluation 01/27/20    Authorization Type [employer]    PT Start Time 4270    PT Stop Time 1415    PT Time Calculation (min) 40 min    Activity Tolerance Patient tolerated treatment well    Behavior During Therapy [facility] for tasks assessed/performed           Past Medical History:  Diagnosis Date  . Essential hypertension 02/15/2007  . Hyperlipidemia 07/13/2013  . Prediabetes 07/13/2013  . Prostate cancer ([place])   . Pulmonary nodule (2008 resolved on f/u)  02/15/2007  . Vitamin D deficiency 07/13/2013    Past Surgical History:  Procedure Laterality Date  . PROSTATE BIOPSY  2020  . PROSTATE BIOPSY  2014  . TONSILECTOMY/ADENOIDECTOMY WITH MYRINGOTOMY    . TREATMENT FISTULA ANAL      There were no vitals filed for this visit.    Subjective Assessment - 12/26/19 1335    Subjective Pt reports falling 2 weeks ago and rolled on the ground and has been experiencing L foot drop since then but had numbness in L ankle prior to the fall. Pt states MRI revealed no pathologies consistent with his symptoms. Pt states he has having his sciatica symptoms since July 2021. Pt started using single point cane 1 month ago. Radiation treatment ended September 04, 2019.    Patient is accompained by: --   church friend drove pt today   Pertinent History Prostate cancer (recently completed radiation treatment), hypertension, prediabetes    Limitations House hold activities;Walking;Sitting    How long can you sit comfortably? Can sit for 30 min to 1 hour before needing to move  around due to pain    How long can you stand comfortably? Pt reports no issues standing still    How long can you walk comfortably? Walking difficult due to L drop foot    Patient Stated Goals push lawnmower, do normal activities (feed birds) that require walking around with better balance    Currently in Pain? Other (Comment)   numbness L lower leg through great toe             [facility] PT Assessment - 12/26/19 1359      Assessment   Medical Diagnosis Sciatica of left side M54.32    Referring Provider (PT) Vicie Mutters, PA-C     Onset Date/Surgical Date --   July 2021   Hand Dominance Right    Next MD Visit 01/09/2020   appointment for EMG   Prior Therapy No      Precautions   Precautions None      Restrictions   Weight Bearing Restrictions No      Balance Screen   Has the patient fallen in the past 6 months Yes    How many times? 1   2 weeks ago   Has the patient had a decrease in activity level because of a fear of falling?  Yes    Is the patient reluctant to leave their home because of a fear of falling?  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      Cognition   Overall Cognitive Status Within Functional Limits for tasks assessed      Observation/Other Assessments   Observations Forward flexed posture with forward head and rounded shoulders in sitting and standing    Focus on Therapeutic Outcomes (FOTO)  45% limited with predicted 31% limited      Sensation   Light Touch Appears  Intact   intact but increased sensitivity in L5 distribution of LLE     Functional Tests   Functional tests --      ROM / Strength   AROM / PROM / Strength AROM;PROM;Strength      AROM   Overall AROM Comments Patient demonstrates equal AROM of hip and knee that is within functional limits. Pt is unable to actively dorsiflex L ankle but has passive range past neutral.    AROM Assessment Site Hip;Knee;Ankle    Right/Left Hip Right;Left    Right/Left Ankle Left    Left Ankle Dorsiflexion --   pt unable to actively dorsiflex ankle for past 2 weeks     Strength   Strength Assessment Site Hip;Ankle;Knee    Right/Left Hip Right;Left    Right Hip Flexion 4/5    Right Hip External Rotation  4-/5    Right Hip Internal Rotation 4-/5    Right Hip ABduction 4-/5    Right Hip ADduction 4-/5    Left Hip Flexion 4/5    Left Hip External Rotation 4-/5    Left Hip Internal Rotation 4-/5    Left Hip ABduction 4-/5    Right/Left Knee Right;Left    Right Knee Flexion 4+/5    Right Knee Extension 4+/5    Left Knee Flexion 4+/5    Left Knee Extension 4+/5    Right/Left Ankle Right;Left    Right Ankle Dorsiflexion 4+/5    Right Ankle Plantar Flexion 4+/5   modified MMT in sitting   Right Ankle Inversion 4/5    Right Ankle Eversion 4/5    Left Ankle Dorsiflexion --   unable to perform   Left Ankle Plantar Flexion 4/5   modified test in sitting   Left Ankle Inversion 4/5    Left Ankle Eversion --   unable to perform     Special Tests   Other special tests SLUMP test (+) with symptoms in lower leg with increased tension                      Objective measurements completed on examination: See above findings.       [place] [facility] PT Treatment/Exercise - 12/26/19 0001      Self-Care   Self-Care ADL's;Other Self-Care Comments    ADL's Pt education provided regarding safety during community ambulation due to pt report of fall 2 weeks ago.    Other Self-Care Comments  Pt education  provided regarding anatomy of  condition, location and distribution of sciatic nerve and peroneal nerves. Pt's questions addressed and answered by PT. Reviewed FOTO score and HEP.      Exercises   Exercises Knee/Hip      Knee/Hip Exercises: Stretches   Piriformis Stretch Limitations seated piriformis stretch demonstrated and performed 1 rep x 15 sec for today's session      Manual Therapy   Manual Therapy Neural Stretch    Neural Stretch L sciatic nerve glides 15x - pt unable to actively dorsiflex L ankle so nerve glides modified accordingly                  PT Education - 12/26/19 1440    Education Details Reviewed and explained FOTO score, HEP, anatomy of condition regarding sciatic nerve involvement in functional mobility    Person(s) Educated Patient    Methods Explanation;Demonstration;Tactile cues;Verbal cues    Comprehension Verbalized understanding;Returned demonstration            PT Short Term Goals - 12/26/19 1451      PT SHORT TERM GOAL #1   Title Patient will be able to perform 5 degrees active L ankle dorsiflexion.    Baseline Pt in slight plantarflexion and unable to perform active dorsiflexion.    Time 2    Period Weeks    Status New    Target Date 01/09/20      PT SHORT TERM GOAL #2   Title Pt will be able to walk around outside for 15 minutes to feed birds without falls or loss of balance using least restrictive adaptive device.    Baseline Pt currently uses single point cane but feels very unsteady when going outside to feed birds.    Time 2    Period Weeks    Status New    Target Date 01/09/20      PT SHORT TERM GOAL #3   Title Patient will increase bilateral hip flexion MMT to 4+/5 to allow for better clearance of each LE during gait.    Baseline 4/5 BLE    Time 2    Period Weeks    Status New    Target Date 01/09/20             PT Long Term Goals - 12/26/19 1456      PT LONG TERM GOAL #1   Title Patient will improve FOTO score  from 55% to 69% (from 45% limited to 31% limited).    Baseline 45% limited    Time 4    Period Weeks    Status New    Target Date 01/23/20      PT LONG TERM GOAL #2   Title Patient will be able to actively dorsiflex L ankle to at least 10 degrees to allow for functional gait pattern.    Baseline Pt unable to actively dorsiflex L ankle    Time 4    Period Weeks    Status New    Target Date 01/23/20      PT LONG TERM GOAL #3   Title Pt will be independent with HEP.    Baseline Pt received HEP following initial eval.    Time 4    Period Weeks    Status New    Target Date 01/23/20                  Plan - 12/26/19 1443    Clinical Impression Statement Patient presents to outpatient PT due to complaints of  sciatica on left side that began in July 2021 with L foot drop that began 2 weeks ago following a fall outdoors. Pt demonstrates inability to actively dorsiflex or evert L ankle but shows hip and knee strength within functional limits. Patient expresses that L lower leg in L5 distribution is more sensitive than R side upon palpation but is not "painful." Patient will benefit from skilled PT intervention to improve functional mobility and strength to allow for increased stability when performing daily activities.    Personal Factors and Comorbidities Age;Comorbidity 1;Comorbidity 2    Comorbidities Prostate cancer (recently completed radiation treatment), hypertension, prediabetes    Examination-Activity Limitations Sit;Stairs;Locomotion Level    Examination-Participation Restrictions Other   mowing lawn, feeding birds   Stability/Clinical Decision Making Evolving/Moderate complexity    Clinical Decision Making Moderate    Rehab Potential Good    PT Frequency 2x / week    PT Duration 4 weeks    PT Treatment/Interventions ADLs/Self Care Home Management;Electrical Stimulation;Moist Heat;Neuromuscular re-education;Balance training;Therapeutic exercise;Therapeutic  activities;Functional mobility training;Stair training;Gait training;Patient/family education;Manual techniques;Passive range of motion;Dry needling;Taping    PT Next Visit Plan Review HEP, assess high level balance, soft tissue mobilization, continue sciatic nerve glides and piriformis stretch    PT Home Exercise Plan [id number]    Consulted and Agree with Plan of Care Patient           Patient will benefit from skilled therapeutic intervention in order to improve the following deficits and impairments:  Abnormal gait, Decreased balance, Decreased strength, Decreased mobility, Difficulty walking, Impaired sensation  Visit Diagnosis: Sciatica, left side  Foot drop, left  Muscle weakness (generalized)     Problem List Patient Active Problem List   Diagnosis Date Noted  . Aortic atherosclerosis ([place]) 09/28/2019  . Plantar fasciitis of left foot 09/21/2018  . Gastroesophageal reflux disease without esophagitis 09/21/2018  . Other abnormal glucose 02/13/2015  . BMI 23.0-23.9, adult 02/13/2015  . 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 Malignant neoplasm of prostate ([place]) 04/19/2014  . Hyperlipidemia 07/13/2013  . Vitamin D deficiency 07/13/2013  . Medication management 07/13/2013  . Essential hypertension 02/15/2007   Haydee Monica, PT, DPT 12/26/19 3:02 PM   [facility] [address] [place], [place], [address] Phone: [phone]   Fax:  [phone]  Name: Matthew Aguirre MRN: [id number] Date of Birth: 07-07-1933

## 2019-12-26 NOTE — Patient Instructions (Addendum)
  We are checking labs and will call you with the results.  Check blood pressure and pulse twice a day.   Pulse range is 60-90.  We are going to send in Metoprolol 50mg  to take twice a day.  Take about 12hours apart form one another.  Start this medication tonight.  We will call you to schedule a follow up appointment for one week.  This can be in person OR a telephone visit.  We will review the blood pressure & pulse log at that time.   Please contact the office by telephone or phone if your blood pressure and pulse do not go down OR IF you have any new symptoms.

## 2019-12-26 NOTE — Telephone Encounter (Signed)
Last week pulse rate was in the 80's and two days later up into the 90's and today, it was 120. BP has remained the same. Please advise.

## 2019-12-26 NOTE — Progress Notes (Signed)
Assessment and Plan:  Matthew Aguirre was seen today for acute visit.  Diagnoses and all orders for this visit:  Tachycardia -     metoprolol tartrate (LOPRESSOR) 50 MG tablet; Take 1 tablet (50 mg total) by mouth 2 (two) times daily. -     CBC with Differential/Platelet -     COMPLETE METABOLIC PANEL WITH GFR -     TSH -     D-dimer, quantitative (not at [facility])  Essential hypertension Continue current medications:D/C bisoprolol-HCTZ 10-6.25mg , Take Metoprolol 50mg  BID and continue to monitor blood pressure Monitor lower extremities Daily weights Monitor blood pressure at home; call if consistently over 130/80 Continue DASH diet.   Reminder to go to the ER if any CP, SOB, nausea, dizziness, severe HA, changes vision/speech, left arm numbness and tingling and jaw pain.    Contact office or return with any new or worsening symptoms.  Discussed hospital precautions with patient including but not limited to change in vision, weakness one side of body, chest pain or shortness of breath. Discussed hospital precautions with patient.      Further disposition pending results if labs check today. Discussed med's effects and SE's.   Over 30 minutes of face to face interview, exam, counseling, chart review, and critical decision making was performed.    Future Appointments  Date Time Provider [place]  01/02/2020 11:30 AM Georges Mouse, PT [facility]  01/04/2020  3:30 PM Randel Books [facility]  01/08/2020  2:00 PM Randel Books [facility]  01/10/2020 11:30 AM Georges Mouse, PT [facility]  01/16/2020 11:30 AM Bobette Mo, PT [facility]  01/18/2020  3:30 PM Georges Mouse, PT [facility]  01/23/2020 11:30 AM Bobette Mo, PT [facility]  01/25/2020  3:30 PM Georges Mouse, PT [facility]  01/29/2020 10:00 AM Unk Pinto, MD [facility] None  10/14/2020 10:00 AM Vicie Mutters, PA-C [facility] None     ------------------------------------------------------------------------------------------------------------------   HPI 86 y.o.male presents for evaluation of heart rate  132/81 was blood pressure at noon 141/89.  His pulse was in the 120's range all day. Denies any headaches, change in vision, chest pains, palpitations, shortness of breath, orthopnea, wheezing, edema of lower extremities  Reports that he fells ok but he does feel a little tired.  He is taking Eleguard for prostate cancer which has drained his energy.  He had a PT evaluation today for his left lower extremity for drop foot.  He continues to work through this.   Past Medical History:  Diagnosis Date  . Essential hypertension 02/15/2007  . Hyperlipidemia 07/13/2013  . Prediabetes 07/13/2013  . Prostate cancer ([place])   . Pulmonary nodule (2008 resolved on f/u)  02/15/2007  . Vitamin D deficiency 07/13/2013     Allergies  Allergen Reactions  . Pravastatin   . Red Yeast Rice [Cholestin]   . Sulfa Antibiotics   . Vibramycin [Doxycycline Calcium]   . Zetia [Ezetimibe]     Current Outpatient Medications on File Prior to Visit  Medication Sig  . acetaminophen (TYLENOL) 500 MG tablet Take 500 mg by mouth as needed (prn).   Marland Kitchen aspirin 81 MG EC tablet Take by mouth.  . B Complex Vitamins (VITAMIN B COMPLEX PO) Take 600 mg by mouth daily.  . bisoprolol-hydrochlorothiazide (ZIAC) 10-6.25 MG tablet Take 1 tablet Daily for BP  . Calcium Carbonate (CALCIUM 600 PO) Take by mouth daily.  . Cholecalciferol (VITAMIN D PO) Take 2,000 Int'l  Units by mouth 2 (two) times daily.   . famotidine (PEPCID) 20 MG tablet Take 20 mg by mouth at bedtime.  . Flaxseed, Linseed, (FLAX SEED OIL PO) Take 1,200 mg by mouth 3 (three) times daily.   Marland Kitchen gabapentin (NEURONTIN) 100 MG capsule Take 1 to 3 capsules    at Bedtime     for Sciatica  . leuprolide, 6 Month, (ELIGARD) 45 MG injection Inject 45 mg into the skin every 6 (six) months.  . Magnesium  250 MG TABS Take 250 mg by mouth daily.  . Omega-3 Fatty Acids (FISH OIL PO) Take 1,200 mg by mouth 2 (two) times daily.   . pantoprazole (PROTONIX) 40 MG tablet Take     1 tablet     Daily     to Prevent Indigestion & Heartburn  . terbinafine (LAMISIL) 250 MG tablet Take 1 tablet (250 mg total) by mouth daily. Take 1 pill daily for fungal infection   No current facility-administered medications on file prior to visit.    ROS: all negative except above.   Physical Exam:  BP (!) 160/88   Pulse (!) 120   Temp (!) 97.3 F (36.3 C)   Wt 146 lb (66.2 kg)   SpO2 97%   BMI 23.57 kg/m   General Appearance: Well nourished, in no apparent distress. Eyes: PERRLA, EOMs, conjunctiva no swelling or erythema Sinuses: No Frontal/maxillary tenderness ENT/Mouth: Ext aud canals clear, TMs without erythema, bulging. No erythema, swelling, or exudate on post pharynx.  Tonsils not swollen or erythematous. Hearing normal.  Neck: Supple, thyroid normal.  Respiratory: Respiratory effort normal, BS equal bilaterally without rales, rhonchi, wheezing or stridor.  Cardio:  Tachycardia, with no MRGs. Brisk peripheral pulses without edema.  Abdomen: Soft, + BS.  Non tender, no guarding, rebound, hernias, masses. Lymphatics: Non tender without lymphadenopathy.  Musculoskeletal: Full ROM, 5/5 strength, normal gait.  Skin: Warm, dry without rashes, lesions, ecchymosis.  Neuro: Cranial nerves intact. Normal muscle tone, no cerebellar symptoms. Sensation intact.  Psych: Awake and oriented X 3, normal affect, Insight and Judgment appropriate.    EKG: Tachycardia, sinus rhythm   [name], NP 4:49 PM [facility]

## 2019-12-27 LAB — CBC WITH DIFFERENTIAL/PLATELET
Absolute Monocytes: 608 cells/uL (ref 200–950)
Basophils Absolute: 19 cells/uL (ref 0–200)
Basophils Relative: 0.3 %
Eosinophils Absolute: 0 cells/uL — ABNORMAL LOW (ref 15–500)
Eosinophils Relative: 0 %
HCT: 43 % (ref 38.5–50.0)
Hemoglobin: 14.4 g/dL (ref 13.2–17.1)
Lymphs Abs: 310 cells/uL — ABNORMAL LOW (ref 850–3900)
MCH: 31.2 pg (ref 27.0–33.0)
MCHC: 33.5 g/dL (ref 32.0–36.0)
MCV: 93.3 fL (ref 80.0–100.0)
MPV: 11.1 fL (ref 7.5–12.5)
Monocytes Relative: 9.8 %
Neutro Abs: 5264 cells/uL (ref 1500–7800)
Neutrophils Relative %: 84.9 %
Platelets: 161 10*3/uL (ref 140–400)
RBC: 4.61 10*6/uL (ref 4.20–5.80)
RDW: 12.5 % (ref 11.0–15.0)
Total Lymphocyte: 5 %
WBC: 6.2 10*3/uL (ref 3.8–10.8)

## 2019-12-27 LAB — COMPLETE METABOLIC PANEL WITH GFR
AG Ratio: 2.3 (calc) (ref 1.0–2.5)
ALT: 25 U/L (ref 9–46)
AST: 18 U/L (ref 10–35)
Albumin: 4.2 g/dL (ref 3.6–5.1)
Alkaline phosphatase (APISO): 53 U/L (ref 35–144)
BUN: 18 mg/dL (ref 7–25)
CO2: 26 mmol/L (ref 20–32)
Calcium: 9.5 mg/dL (ref 8.6–10.3)
Chloride: 104 mmol/L (ref 98–110)
Creat: 1.06 mg/dL (ref 0.70–1.11)
GFR, Est African American: 74 mL/min/{1.73_m2} (ref >=60)
GFR, Est Non African American: 64 mL/min/{1.73_m2} (ref >=60)
Globulin: 1.8 g/dL (calc) — ABNORMAL LOW (ref 1.9–3.7)
Glucose, Bld: 142 mg/dL — ABNORMAL HIGH (ref 65–99)
Potassium: 4.6 mmol/L (ref 3.5–5.3)
Sodium: 140 mmol/L (ref 135–146)
Total Bilirubin: 0.5 mg/dL (ref 0.2–1.2)
Total Protein: 6 g/dL — ABNORMAL LOW (ref 6.1–8.1)

## 2019-12-27 LAB — TSH: TSH: 0.8 mIU/L (ref 0.40–4.50)

## 2019-12-27 LAB — D-DIMER, QUANTITATIVE: D-Dimer, Quant: 0.39 mcg/mL FEU (ref <0.50)

## 2019-12-27 NOTE — Progress Notes (Signed)
Please contact patient with results:    D-dimer in normal range.  Thyroid-TSH in normal range.  Blood count in normal range  Electrolytes, kidney & liver function in normal range.   Have him schedule follow up 1 week, in person or telephone to follow up on his pulse.  Sincerely,           [name], NP

## 2019-12-28 ENCOUNTER — Other Ambulatory Visit: Payer: Medicare HMO

## 2020-01-02 ENCOUNTER — Other Ambulatory Visit: Payer: Self-pay

## 2020-01-02 ENCOUNTER — Ambulatory Visit: Payer: Medicare HMO

## 2020-01-02 DIAGNOSIS — M21372 Foot drop, left foot: Secondary | ICD-10-CM

## 2020-01-02 DIAGNOSIS — M6281 Muscle weakness (generalized): Secondary | ICD-10-CM

## 2020-01-02 DIAGNOSIS — M5432 Sciatica, left side: Secondary | ICD-10-CM

## 2020-01-02 NOTE — Therapy (Signed)
[place], [place], [address] Phone: [phone]   Fax:  [phone]  Physical Therapy Treatment  Patient Details  Name: Matthew Aguirre MRN: [id number] Date of Birth: 1933/03/24 Referring Provider (PT): Vicie Mutters, [place]    Encounter Date: 01/02/2020   PT End of Session - 01/02/20 1129    Visit Number 2    Number of Visits 9    Date for PT Re-Evaluation 01/27/20    Authorization Type [employer]na Medicare    PT Start Time 1130    PT Stop Time 1215    PT Time Calculation (min) 45 min    Activity Tolerance Patient tolerated treatment well    Behavior During Therapy [facility] for tasks assessed/performed           Past Medical History:  Diagnosis Date  . Essential hypertension 02/15/2007  . Hyperlipidemia 07/13/2013  . Prediabetes 07/13/2013  . Prostate cancer ([place])   . Pulmonary nodule (2008 resolved on f/u)  02/15/2007  . Vitamin D deficiency 07/13/2013    Past Surgical History:  Procedure Laterality Date  . PROSTATE BIOPSY  2020  . PROSTATE BIOPSY  2014  . TONSILECTOMY/ADENOIDECTOMY WITH MYRINGOTOMY    . TREATMENT FISTULA ANAL      There were no vitals filed for this visit.   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 Pt states he continues to have feeling of pins and needles in LLE down to foot.    Patient is accompained by: --   church friend drove pt today   Pertinent History Prostate cancer (recently completed radiation treatment), hypertension, prediabetes    Limitations House hold activities;Walking;Sitting    How long can you sit comfortably? Can sit for 30 min to 1 hour before needing to move around due to pain    How long can you stand comfortably? Pt reports no issues standing still    How long can you walk comfortably? Walking difficult due to L drop foot     Patient Stated Goals push lawnmower, do normal activities (feed birds) that require walking around with better balance    Currently in Pain? Other (Comment)   pt denies pain but c/o pins and needles in LLE             [facility] PT Assessment - 01/02/20 0001      Assessment   Medical Diagnosis Sciatica of left side M54.32    Referring Provider (PT) Vicie Mutters, PA-C       AROM   Right/Left Ankle Right    Right Ankle Dorsiflexion 15    Right Ankle Plantar Flexion 30    Right Ankle Inversion 20    Right Ankle Eversion 20    Left Ankle Dorsiflexion -30    Left Ankle Plantar Flexion 35    Left Ankle Inversion 5    Left Ankle Eversion 0      PROM   PROM Assessment Site Ankle    Right/Left Ankle Right;Left    Left Ankle Dorsiflexion 0    Left Ankle Plantar Flexion 35    Left Ankle Inversion 10    Left Ankle Eversion 15      High Level Balance   High Level Balance Comments narrow BOS and tandem standing alternating - decreased balance with LLE placed posteriorly. Use of SPC in RUE as  needed during balance assessment.                         [place] Adult PT Treatment/Exercise - 01/02/20 0001      Self-Care   Self-Care Other Self-Care Comments    ADL's Pt asked if he can/should return to walking outside along the road. Pt advised to limit his walking to safer areas or around the house pending EMG or when he is able to actively dorsiflex his L ankle again for safety.    Other Self-Care Comments  HEP, anatomy of sciatic and peroneal nerves and contribution to ankle strength/mobility, use of e-stim next visit to promote L ankle dorsiflexion and eversion.      Knee/Hip Exercises: Stretches   Piriformis Stretch Limitations supine piriformis stretch 2 x 30 seconds      Knee/Hip Exercises: Aerobic   Nustep L2 x 5 min      Manual Therapy   Manual Therapy Soft tissue mobilization;Myofascial release;Joint mobilization;Passive ROM    Joint Mobilization anterior glides of  fibula on tibia grades III and IV    Soft tissue mobilization STM and myofascial release of L piriformis and glute med    Passive ROM PROM of L ankle to maintain motion since pt is currently unable to actively dorsiflex or evert      Ankle Exercises: Seated   Towel Crunch Limitations 20 x with LLE                  PT Education - 01/02/20 1305    Education Details HEP, anatomy of sciatic and peroneal nerves and contribution to ankle strength/mobility, use of e-stim next visit to promote L ankle dorsiflexion and eversion. Pt asked if he can/should return to walking outside along the road. Pt advised to limit his walking to safer areas or around the house pending EMG or when he is able to actively dorsiflex his L ankle again for safety.    Person(s) Educated Patient    Methods Explanation;Demonstration;Verbal cues;Tactile cues    Comprehension Verbalized understanding;Returned demonstration;Verbal cues required;Tactile cues required            PT Short Term Goals - 12/26/19 1451      PT SHORT TERM GOAL #1   Title Patient will be able to perform 5 degrees active L ankle dorsiflexion.    Baseline Pt in slight plantarflexion and unable to perform active dorsiflexion.    Time 2    Period Weeks    Status New    Target Date 01/09/20      PT SHORT TERM GOAL #2   Title Pt will be able to walk around outside for 15 minutes to feed birds without falls or loss of balance using least restrictive adaptive device.    Baseline Pt currently uses single point cane but feels very unsteady when going outside to feed birds.    Time 2    Period Weeks    Status New    Target Date 01/09/20      PT SHORT TERM GOAL #3   Title Patient will increase bilateral hip flexion MMT to 4+/5 to allow for better clearance of each LE during gait.    Baseline 4/5 BLE    Time 2    Period Weeks    Status New    Target Date 01/09/20             PT Long Term Goals - 12/26/19 1456  PT LONG TERM GOAL  #1   Title Patient will improve FOTO score from 55% to 69% (from 45% limited to 31% limited).    Baseline 45% limited    Time 4    Period Weeks    Status New    Target Date 01/23/20      PT LONG TERM GOAL #2   Title Patient will be able to actively dorsiflex L ankle to at least 10 degrees to allow for functional gait pattern.    Baseline Pt unable to actively dorsiflex L ankle    Time 4    Period Weeks    Status New    Target Date 01/23/20      PT LONG TERM GOAL #3   Title Pt will be independent with HEP.    Baseline Pt received HEP following initial eval.    Time 4    Period Weeks    Status New    Target Date 01/23/20                 Plan - 01/02/20 1137    Clinical Impression Statement Patient tolerated treatment session well but has continued complaints of numbness in LLE down to foot. Pt is still unable to actively dorsiflex or evert L ankle. Pt has neuro doctor's appointment next Tuesday in which pt states he will have an EMG test done due to L foot drop.    Personal Factors and Comorbidities Age;Comorbidity 1;Comorbidity 2    Comorbidities Prostate cancer (recently completed radiation treatment), hypertension, prediabetes    Examination-Activity Limitations Sit;Stairs;Locomotion Level    Examination-Participation Restrictions Other   mowing lawn, feeding birds   Stability/Clinical Decision Making Evolving/Moderate complexity    Rehab Potential Good    PT Frequency 2x / week    PT Duration 4 weeks    PT Treatment/Interventions ADLs/Self Care Home Management;Electrical Stimulation;Moist Heat;Neuromuscular re-education;Balance training;Therapeutic exercise;Therapeutic activities;Functional mobility training;Stair training;Gait training;Patient/family education;Manual techniques;Passive range of motion;Dry needling;Taping    PT Next Visit Plan Review HEP, soft tissue mobilization, continue sciatic nerve glides and piriformis stretch, ankle/hip/knee strengthening, e-stim  for peroneal nerve distribution activation (anterior tib, peroneus longus and brevis)    PT Home Exercise Plan [email or web address]    Consulted and Agree with Plan of Care Patient           Patient will benefit from skilled therapeutic intervention in order to improve the following deficits and impairments:  Abnormal gait, Decreased balance, Decreased strength, Decreased mobility, Difficulty walking, Impaired sensation  Visit Diagnosis: Sciatica, left side  Foot drop, left  Muscle weakness (generalized)     Problem List Patient Active Problem List   Diagnosis Date Noted  . Aortic atherosclerosis ([place]) 09/28/2019  . Plantar fasciitis of left foot 09/21/2018  . Gastroesophageal reflux disease without esophagitis 09/21/2018  . Other abnormal glucose 02/13/2015  . BMI 23.0-23.9, adult 02/13/2015  . 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 Malignant neoplasm of prostate ([place]) 04/19/2014  . Hyperlipidemia 07/13/2013  . Vitamin D deficiency 07/13/2013  . Medication management 07/13/2013  . Essential hypertension 02/15/2007     Haydee Monica, PT, DPT 01/02/20 1:20 PM  [facility] [address] [place], [place], [address] Phone: [phone]   Fax:  [phone]  Name: Matthew Aguirre MRN: [id number] Date of Birth: 06/29/1933

## 2020-01-04 ENCOUNTER — Ambulatory Visit: Payer: Medicare HMO | Admitting: Adult Health Nurse Practitioner

## 2020-01-04 ENCOUNTER — Ambulatory Visit: Payer: Medicare HMO

## 2020-01-04 ENCOUNTER — Other Ambulatory Visit: Payer: Self-pay

## 2020-01-04 ENCOUNTER — Encounter: Payer: Self-pay | Admitting: Adult Health Nurse Practitioner

## 2020-01-04 DIAGNOSIS — M6281 Muscle weakness (generalized): Secondary | ICD-10-CM

## 2020-01-04 DIAGNOSIS — R000 Tachycardia, unspecified: Secondary | ICD-10-CM

## 2020-01-04 DIAGNOSIS — M5432 Sciatica, left side: Secondary | ICD-10-CM | POA: Diagnosis not present

## 2020-01-04 DIAGNOSIS — I10 Essential (primary) hypertension: Secondary | ICD-10-CM

## 2020-01-04 DIAGNOSIS — M21372 Foot drop, left foot: Secondary | ICD-10-CM | POA: Diagnosis not present

## 2020-01-04 MED ORDER — METOPROLOL TARTRATE 50 MG PO TABS: 50.0000 mg | ORAL_TABLET | Freq: Two times a day (BID) | ORAL | 3 refills | Status: DC

## 2020-01-04 NOTE — Therapy (Signed)
[place], [place], [address] Phone: [phone]   Fax:  [phone]  Physical Therapy Treatment  Patient Details  Name: Matthew Aguirre MRN: [id number] Date of Birth: November 19, 1933 Referring Provider (PT): Vicie Mutters, [place]    Encounter Date: 01/04/2020   PT End of Session - 01/04/20 1529    Visit Number 3    Number of Visits 9    Date for PT Re-Evaluation 01/27/20    Authorization Type Aetna Medicare    PT Start Time 2409    PT Stop Time 1617    PT Time Calculation (min) 47 min    Activity Tolerance Patient tolerated treatment well    Behavior During Therapy [facility] for tasks assessed/performed           Past Medical History:  Diagnosis Date  . Essential hypertension 02/15/2007  . Hyperlipidemia 07/13/2013  . Prediabetes 07/13/2013  . Prostate cancer ([place])   . Pulmonary nodule (2008 resolved on f/u)  02/15/2007  . Vitamin D deficiency 07/13/2013    Past Surgical History:  Procedure Laterality Date  . PROSTATE BIOPSY  2020  . PROSTATE BIOPSY  2014  . TONSILECTOMY/ADENOIDECTOMY WITH MYRINGOTOMY    . TREATMENT FISTULA ANAL      There were no vitals filed for this visit.   Subjective Assessment - 01/04/20 1529    Subjective "I feel a little shaky today." Pt reports feeling increased pain in the hip today and continued numbness in L shin. Pt states there is no change with L foot drop. Pt reports having an injection every 6 months as part of cancer rehab that decreases testosterone levels and his energy/desire to perform tasks.    Patient is accompained by: --   church friend drove pt today   Pertinent History Prostate cancer (recently completed radiation treatment), hypertension, prediabetes    Limitations House hold activities;Walking;Sitting    How long can you sit comfortably? Can sit for 30 min to 1 hour before needing to move around due to pain    How long can you stand comfortably? Pt  reports no issues standing still    How long can you walk comfortably? Walking difficult due to L drop foot    Patient Stated Goals push lawnmower, do normal activities (feed birds) that require walking around with better balance              [facility] PT Assessment - 01/04/20 0001      Assessment   Medical Diagnosis Sciatica of left side M54.32    Referring Provider (PT) Vicie Mutters, PA-C                          [facility] Adult PT Treatment/Exercise - 01/04/20 0001      Self-Care   Self-Care Other Self-Care Comments    Other Self-Care Comments  PT and pt discussed response to [place] electrical stimulation of anterior tibialis and peroneals. PT advised pt to come to [facility] PT appointment if he is having symptoms in hip to allow for symptom relief and beginning hip strengthening/balance activities. Since pt's primary concern is L foot numbness and foot drop, pt was advised to cancel Monday's appointment and come in after Tuesday's neurologist appointment instead if no symptoms in hip are present.      Knee/Hip Exercises: Stretches   Piriformis Stretch Limitations supine piriformis stretch 2 x 30 seconds      Knee/Hip Exercises: Aerobic  Nustep L5 x 6 min      Modalities   Modalities Teacher, English as a foreign language Location L anterior tibialis and peroneals    Electrical Stimulation Action [place] electrical stimulation    Electrical Stimulation Parameters 100 pps; 10:20 on:off time with 2 sec ramp up/down    Electrical Stimulation Goals Neuromuscular facilitation;Other (comment)   Muscle activation of L anterior tibialis and peroneals     Manual Therapy   Manual Therapy Soft tissue mobilization;Myofascial release;Passive ROM;Neural Stretch    Soft tissue mobilization STM and myofascial release of L piriformis and glute med    Passive ROM PROM of L ankle in all planes    Neural Stretch Passive L sciatic nerve glides  with patient in supine x 20. Pt demonstrated increased tolerance to passive SLR after several repetitions.                  PT Education - 01/04/20 1628    Education Details PT and pt discussed response to [place] electrical stimulation of anterior tibialis and peroneals. PT advised pt to come to [facility] PT appointment if he is having symptoms in hip to allow for symptom relief and beginning hip strengthening/balance activities. Since pt's primary concern is L foot numbness and foot drop, pt was advised to cancel Monday's appointment and come in after Tuesday's neurologist appointment instead if no symptoms in hip are present.    Person(s) Educated Patient    Methods Explanation;Demonstration;Tactile cues;Verbal cues    Comprehension Verbalized understanding;Returned demonstration;Verbal cues required;Tactile cues required            PT Short Term Goals - 12/26/19 1451      PT SHORT TERM GOAL #1   Title Patient will be able to perform 5 degrees active L ankle dorsiflexion.    Baseline Pt in slight plantarflexion and unable to perform active dorsiflexion.    Time 2    Period Weeks    Status New    Target Date 01/09/20      PT SHORT TERM GOAL #2   Title Pt will be able to walk around outside for 15 minutes to feed birds without falls or loss of balance using least restrictive adaptive device.    Baseline Pt currently uses single point cane but feels very unsteady when going outside to feed birds.    Time 2    Period Weeks    Status New    Target Date 01/09/20      PT SHORT TERM GOAL #3   Title Patient will increase bilateral hip flexion MMT to 4+/5 to allow for better clearance of each LE during gait.    Baseline 4/5 BLE    Time 2    Period Weeks    Status New    Target Date 01/09/20             PT Long Term Goals - 12/26/19 1456      PT LONG TERM GOAL #1   Title Patient will improve FOTO score from 55% to 69% (from 45% limited to 31% limited).    Baseline 45%  limited    Time 4    Period Weeks    Status New    Target Date 01/23/20      PT LONG TERM GOAL #2   Title Patient will be able to actively dorsiflex L ankle to at least 10 degrees to allow for functional gait pattern.    Baseline  Pt unable to actively dorsiflex L ankle    Time 4    Period Weeks    Status New    Target Date 01/23/20      PT LONG TERM GOAL #3   Title Pt will be independent with HEP.    Baseline Pt received HEP following initial eval.    Time 4    Period Weeks    Status New    Target Date 01/23/20                 Plan - 01/04/20 1637    Clinical Impression Statement Pt experienced relief of L hip pain that was present upon arrival following STM and myofascial release of L piriformis and glute med and stretches. Pt had no adverse effects from [place] electrical stimulation. Minimal to no contraction was observed with electrodes placed over anterior tibialis and moderate contraction was observed with electrodes placed over peroneals. Pt has appointment with neurologist Tuesday in which he states he will have EMG done (see "pt education").    Personal Factors and Comorbidities Age;Comorbidity 1;Comorbidity 2    Comorbidities Prostate cancer (recently completed radiation treatment), hypertension, prediabetes    Examination-Activity Limitations Sit;Stairs;Locomotion Level    Examination-Participation Restrictions Other   mowing lawn, feeding birds   Stability/Clinical Decision Making Evolving/Moderate complexity    Rehab Potential Good    PT Frequency 2x / week    PT Duration 4 weeks    PT Treatment/Interventions ADLs/Self Care Home Management;Electrical Stimulation;Moist Heat;Neuromuscular re-education;Balance training;Therapeutic exercise;Therapeutic activities;Functional mobility training;Stair training;Gait training;Patient/family education;Manual techniques;Passive range of motion;Dry needling;Taping    PT Next Visit Plan Review HEP, begin hip/knee  strengthening and balance activities (clams, hip ABD, SLS, tandem standing, etc), soft tissue mobilization, continue sciatic nerve glides and piriformis stretch    PT Home Exercise Plan [email or web address] - sciatic nerve glides, piriformis stretch    Consulted and Agree with Plan of Care Patient           Patient will benefit from skilled therapeutic intervention in order to improve the following deficits and impairments:  Abnormal gait, Decreased balance, Decreased strength, Decreased mobility, Difficulty walking, Impaired sensation  Visit Diagnosis: Sciatica, left side  Foot drop, left  Muscle weakness (generalized)     Problem List Patient Active Problem List   Diagnosis Date Noted  . Aortic atherosclerosis ([place]) 09/28/2019  . Plantar fasciitis of left foot 09/21/2018  . Gastroesophageal reflux disease without esophagitis 09/21/2018  . Other abnormal glucose 02/13/2015  . BMI 23.0-23.9, adult 02/13/2015  . 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 Malignant neoplasm of prostate ([place]) 04/19/2014  . Hyperlipidemia 07/13/2013  . Vitamin D deficiency 07/13/2013  . Medication management 07/13/2013  . Essential hypertension 02/15/2007    Haydee Monica, PT, DPT 01/04/20 5:18 PM    [place] [address] [place], [place], [address] Phone: [phone]   Fax:  [phone]  Name: COMER DEVINS MRN: [id number] Date of Birth: Jul 18, 1933

## 2020-01-04 NOTE — Progress Notes (Signed)
Virtual Visit via Telephone Note  I connected with Matthew Aguirre on 01/15/20 at 11:30 AM EDT by telephone and verified that I am speaking with the correct person using two identifiers.  Related to COVID-19 restrictions.   I discussed the limitations, risks, security and privacy concerns of performing an evaluation and management service by telephone and the availability of in person appointments. I also discussed with the patient that there may be a patient responsible charge related to this service. The patient expressed understanding and agreed to proceed.  Assessment and Plan:  Matthew Aguirre was seen today for acute visit.  Diagnoses and all orders for this visit:  Tachycardia -     Continue: metoprolol tartrate (LOPRESSOR) 50 MG tablet; Take 1 tablet (50 mg total) by mouth 2 (two) times daily. Continue to monitor pulse Contact office with any readings consistantly 90 or higher. -     CBC with Differential/Platelet -     COMPLETE METABOLIC PANEL WITH GFR -     TSH -     D-dimer, quantitative (not at [facility])  Essential hypertension Continue current medications:D/C bisoprolol-HCTZ 10-6.25mg , Take Continue to monitor blood pressure Monitor lower extremities Daily weights Monitor blood pressure at home; call if consistently over 130/80 Continue DASH diet.   Reminder to go to the ER if any CP, SOB, nausea, dizziness, severe HA, changes vision/speech, left arm numbness and tingling and jaw pain.    Contact office or return with any new or worsening symptoms.  Discussed hospital precautions with patient including but not limited to change in vision, weakness one side of body, chest pain or shortness of breath. Discussed hospital precautions with patient.      Further disposition pending results if labs check today. Discussed med's effects and SE's.   Over 30 minutes of face to face interview, exam, counseling, chart review, and critical decision making was performed.    Future  Appointments  Date Time Provider [place]  01/16/2020 11:30 AM Bobette Mo, PT [facility]  01/18/2020  3:30 PM Randel Books [facility]  01/23/2020 11:30 AM Bobette Mo, PT [facility]  01/25/2020  3:30 PM Georges Mouse, PT [facility]  01/29/2020 10:00 AM Unk Pinto, MD [facility] None  10/14/2020 10:30 AM Matthew Sierras, NP [facility] None    ------------------------------------------------------------------------------------------------------------------   HPI 86 y.o.male for follow up on tachycardia.  Report his blood pressure ranges from 120's-130's over 70's-80's.  His pulse has decreased from n 120's and he has been monitoring this at home.  He reports his pulse readings have ranged anywhere from  70's-80's.  He has not had any further elevations in pulse.  He was asymptomatic with the elevation and continues to be asymptomatic.  He denies any side effects from the medication and is taking twice a day with any difficulties.    From last OV:  presents for evaluation of heart rate  132/81 was blood pressure at noon 141/89.  His pulse was in the 120's range all day. Denies any headaches, change in vision, chest pains, palpitations, shortness of breath, orthopnea, wheezing, edema of lower extremities  Reports that he fells ok but he does feel a little tired.  He is taking Eleguard for prostate cancer which has drained his energy.  He had a PT evaluation today for his left lower extremity for drop foot.  He continues to work through this.     Past Medical History:  Diagnosis Date   Essential hypertension 02/15/2007  Hyperlipidemia 07/13/2013   Prediabetes 07/13/2013   Prostate cancer ([place])    Pulmonary nodule (2008 resolved on f/u)  02/15/2007   Vitamin D deficiency 07/13/2013     Allergies  Allergen Reactions   Pravastatin    Red Yeast Rice [Cholestin]    Sulfa Antibiotics    Vibramycin [Doxycycline Calcium]    Zetia  [Ezetimibe]     Current Outpatient Medications on File Prior to Visit  Medication Sig   acetaminophen (TYLENOL) 500 MG tablet Take 500 mg by mouth as needed (prn).    aspirin 81 MG EC tablet Take by mouth.   B Complex Vitamins (VITAMIN B COMPLEX PO) Take 600 mg by mouth daily.   Calcium Carbonate (CALCIUM 600 PO) Take by mouth daily.   Cholecalciferol (VITAMIN D PO) Take 2,000 Int'l Units by mouth 2 (two) times daily.    famotidine (PEPCID) 20 MG tablet Take 20 mg by mouth at bedtime.   Flaxseed, Linseed, (FLAX SEED OIL PO) Take 1,200 mg by mouth 3 (three) times daily.    gabapentin (NEURONTIN) 100 MG capsule Take 1 to 3 capsules    at Bedtime     for Sciatica   leuprolide, 6 Month, (ELIGARD) 45 MG injection Inject 45 mg into the skin every 6 (six) months.   Magnesium 250 MG TABS Take 250 mg by mouth daily.   Omega-3 Fatty Acids (FISH OIL PO) Take 1,200 mg by mouth 2 (two) times daily.    pantoprazole (PROTONIX) 40 MG tablet Take     1 tablet     Daily     to Prevent Indigestion & Heartburn   terbinafine (LAMISIL) 250 MG tablet Take 1 tablet (250 mg total) by mouth daily. Take 1 pill daily for fungal infection   No current facility-administered medications on file prior to visit.    ROS: all negative except above.   Physical Exam:  General Appearance: Well nourished well developed, in no apparent distress.  Eyes: conjunctiva no swelling or erythema ENT/Mouth: No hoarseness, No cough for duration of visit.  Neck: Supple  Respiratory: Respiratory effort normal, normal rate, no retractions or distress.   Cardio: Appears well-perfused, noncyanotic Musculoskeletal: no obvious deformity Skin: visible skin without rashes, ecchymosis, erythema Neuro: Awake and oriented X 3,  Psych:  normal affect, Insight and Judgment appropriate.   Matthew Sierras, NP 12:30 PM [facility]

## 2020-01-08 ENCOUNTER — Ambulatory Visit: Payer: Medicare HMO

## 2020-01-09 DIAGNOSIS — G541 Lumbosacral plexus disorders: Secondary | ICD-10-CM | POA: Diagnosis not present

## 2020-01-09 DIAGNOSIS — G5732 Lesion of lateral popliteal nerve, left lower limb: Secondary | ICD-10-CM | POA: Diagnosis not present

## 2020-01-10 ENCOUNTER — Other Ambulatory Visit: Payer: Self-pay

## 2020-01-10 ENCOUNTER — Ambulatory Visit: Payer: Medicare HMO

## 2020-01-10 DIAGNOSIS — M21372 Foot drop, left foot: Secondary | ICD-10-CM | POA: Diagnosis not present

## 2020-01-10 DIAGNOSIS — M6281 Muscle weakness (generalized): Secondary | ICD-10-CM

## 2020-01-10 DIAGNOSIS — M5432 Sciatica, left side: Secondary | ICD-10-CM

## 2020-01-10 NOTE — Therapy (Signed)
[place], [place], [address] Phone: [phone]   Fax:  [phone]  Physical Therapy Treatment  Patient Details  Name: Matthew Aguirre MRN: [id number] Date of Birth: 14-Jan-1934 Referring Provider (PT): Vicie Mutters, [place]    Encounter Date: 01/10/2020   PT End of Session - 01/10/20 1212    Visit Number 4    Number of Visits 9    Date for PT Re-Evaluation 01/27/20    Authorization Type [employer] Medicare    PT Start Time 1130    PT Stop Time 1212    PT Time Calculation (min) 42 min    Activity Tolerance Patient tolerated treatment well    Behavior During Therapy [facility] for tasks assessed/performed           Past Medical History:  Diagnosis Date  . Essential hypertension 02/15/2007  . Hyperlipidemia 07/13/2013  . Prediabetes 07/13/2013  . Prostate cancer ([place])   . Pulmonary nodule (2008 resolved on f/u)  02/15/2007  . Vitamin D deficiency 07/13/2013    Past Surgical History:  Procedure Laterality Date  . PROSTATE BIOPSY  2020  . PROSTATE BIOPSY  2014  . TONSILECTOMY/ADENOIDECTOMY WITH MYRINGOTOMY    . TREATMENT FISTULA ANAL      There were no vitals filed for this visit.   Subjective Assessment - 01/10/20 1136    Subjective "My left foot got caught, and I tripped while going through the backdoor this morning, but I was able to catch myself to avoid falling. I don't have results from the nerve test yesterday. My hip is aching more today, so I wanted to come in for that."    Patient is accompained by: --   church friend drove pt today   Pertinent History Prostate cancer (recently completed radiation treatment), hypertension, prediabetes    Limitations House hold activities;Walking;Sitting    How long can you sit comfortably? Can sit for 30 min to 1 hour before needing to move around due to pain    How long can you stand comfortably? Pt reports no issues standing still    How long can you walk  comfortably? Walking difficult due to L drop foot    Patient Stated Goals push lawnmower, do normal activities (feed birds) that require walking around with better balance    Currently in Pain? Yes    Pain Score 5     Pain Location Hip    Pain Orientation Left    Pain Descriptors / Indicators Aching;Dull              [facility] PT Assessment - 01/10/20 0001      Assessment   Medical Diagnosis Sciatica of left side M54.32    Referring Provider (PT) Vicie Mutters, PA-C                          [facility] Adult PT Treatment/Exercise - 01/10/20 0001      Standardized Balance Assessment   Standardized Balance Assessment Timed Up and Go Test      Timed Up and Go Test   TUG Normal TUG    Normal TUG (seconds) 17.52      Self-Care   Self-Care Other Self-Care Comments    Other Self-Care Comments  Addition of interventions to HEP, discussed dorsiflexion assist AFO and ACE wrap into DF for assistance while L foot drop is present to allow for increased safety. Explained how each hip strengthening intervention is beneficial  for functional mobility.      Knee/Hip Exercises: Aerobic   Nustep L5 x 5 min      Knee/Hip Exercises: Standing   Hip Extension Stengthening;Both;20 reps;Knee straight    Forward Step Up Left;15 reps;Step Height: 4"    Forward Step Up Limitations Ascend LLE and descend RLE    Step Down Right;15 reps;Step Height: 4"    Step Down Limitations Descend RLE slow and controlled    SLS LLE 2 x 30 with BUE support as needed to regain balance    Other Standing Knee Exercises Standing marches 30x (15x each LE) with BUE support as needed at counter    Other Standing Knee Exercises Tandem standing 2 x 30 sec each LE       Knee/Hip Exercises: Seated   Long Arc Quad Strengthening;Left;20 reps      Knee/Hip Exercises: Supine   Bridges Strengthening;Both;20 reps    Straight Leg Raises Strengthening;Left;20 reps      Knee/Hip Exercises: Sidelying   Hip ABduction  Strengthening;Left;10 reps    Hip ABduction Limitations AAROM initially to actively ABD L hip then 3 second hold by pt    Clams LLE while R sidelying 20x      Manual Therapy   Manual Therapy Soft tissue mobilization;Myofascial release    Soft tissue mobilization STM and myofascial release of L piriformis and glute med    Neural Stretch Passive L sciatic nerve glides with patient in long sitting x 10.                   PT Education - 01/10/20 1214    Education Details Addition of interventions to HEP, discussed dorsiflexion assist AFO and ACE wrap into DF for assistance while L foot drop is present to allow for increased safety. Explained how each hip strengthening intervention is beneficial for functional mobility.    Person(s) Educated Patient    Methods Explanation;Demonstration;Tactile cues;Verbal cues    Comprehension Verbalized understanding;Returned demonstration;Verbal cues required;Tactile cues required            PT Short Term Goals - 12/26/19 1451      PT SHORT TERM GOAL #1   Title Patient will be able to perform 5 degrees active L ankle dorsiflexion.    Baseline Pt in slight plantarflexion and unable to perform active dorsiflexion.    Time 2    Period Weeks    Status New    Target Date 01/09/20      PT SHORT TERM GOAL #2   Title Pt will be able to walk around outside for 15 minutes to feed birds without falls or loss of balance using least restrictive adaptive device.    Baseline Pt currently uses single point cane but feels very unsteady when going outside to feed birds.    Time 2    Period Weeks    Status New    Target Date 01/09/20      PT SHORT TERM GOAL #3   Title Patient will increase bilateral hip flexion MMT to 4+/5 to allow for better clearance of each LE during gait.    Baseline 4/5 BLE    Time 2    Period Weeks    Status New    Target Date 01/09/20             PT Long Term Goals - 12/26/19 1456      PT LONG TERM GOAL #1   Title  Patient will improve FOTO score from 55%  to 69% (from 45% limited to 31% limited).    Baseline 45% limited    Time 4    Period Weeks    Status New    Target Date 01/23/20      PT LONG TERM GOAL #2   Title Patient will be able to actively dorsiflex L ankle to at least 10 degrees to allow for functional gait pattern.    Baseline Pt unable to actively dorsiflex L ankle    Time 4    Period Weeks    Status New    Target Date 01/23/20      PT LONG TERM GOAL #3   Title Pt will be independent with HEP.    Baseline Pt received HEP following initial eval.    Time 4    Period Weeks    Status New    Target Date 01/23/20                 Plan - 01/10/20 1146    Clinical Impression Statement Pt did well with focus on hip strengthening today. Pt experienced fatigue, especially during sidelying L hip ABD, but did not have any complaints of increased pain. Pt expressed that his L hip pain was present but eased at end of treatment session. Interventions done today were added to pt's HEP and printed with verbal instructions to perform in a safe area at home (counter for UE support as needed, bed for mat exercises).    Personal Factors and Comorbidities Age;Comorbidity 1;Comorbidity 2    Comorbidities Prostate cancer (recently completed radiation treatment), hypertension, prediabetes    Examination-Activity Limitations Sit;Stairs;Locomotion Level    Examination-Participation Restrictions Other   mowing lawn, feeding birds   Stability/Clinical Decision Making Evolving/Moderate complexity    Rehab Potential Good    PT Frequency 2x / week    PT Duration 4 weeks    PT Treatment/Interventions ADLs/Self Care Home Management;Electrical Stimulation;Moist Heat;Neuromuscular re-education;Balance training;Therapeutic exercise;Therapeutic activities;Functional mobility training;Stair training;Gait training;Patient/family education;Manual techniques;Passive range of motion;Dry needling;Taping    PT Next  Visit Plan Review HEP, begin hip/knee strengthening and balance activities (clams, hip ABD, SLS, tandem standing, etc), soft tissue mobilization, continue sciatic nerve glides and piriformis stretch    PT Home Exercise Plan [email or web address] - sciatic nerve glides, piriformis stretch, clams, sidelying L hip ABD, supine SLR, bridges, standing B hip EXT, standing marches    Consulted and Agree with Plan of Care Patient           Patient will benefit from skilled therapeutic intervention in order to improve the following deficits and impairments:  Abnormal gait, Decreased balance, Decreased strength, Decreased mobility, Difficulty walking, Impaired sensation  Visit Diagnosis: Sciatica, left side  Foot drop, left  Muscle weakness (generalized)     Problem List Patient Active Problem List   Diagnosis Date Noted  . Aortic atherosclerosis ([place]) 09/28/2019  . Plantar fasciitis of left foot 09/21/2018  . Gastroesophageal reflux disease without esophagitis 09/21/2018  . Other abnormal glucose 02/13/2015  . BMI 23.0-23.9, adult 02/13/2015  . 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 Malignant neoplasm of prostate ([place]) 04/19/2014  . Hyperlipidemia 07/13/2013  . Vitamin D deficiency 07/13/2013  . Medication management 07/13/2013  . Essential hypertension 02/15/2007     Haydee Monica, PT, DPT 01/10/20 1:15 PM  [facility] [address] [place], [place], [address] Phone: [phone]   Fax:  [phone]  Name: Matthew Aguirre MRN: [id number] Date of Birth: [birth year]

## 2020-01-16 ENCOUNTER — Other Ambulatory Visit: Payer: Self-pay

## 2020-01-16 ENCOUNTER — Encounter: Payer: Self-pay | Admitting: Physical Therapy

## 2020-01-16 ENCOUNTER — Ambulatory Visit: Payer: Medicare HMO | Attending: Physician Assistant | Admitting: Physical Therapy

## 2020-01-16 DIAGNOSIS — M5432 Sciatica, left side: Secondary | ICD-10-CM

## 2020-01-16 DIAGNOSIS — M21372 Foot drop, left foot: Secondary | ICD-10-CM | POA: Diagnosis not present

## 2020-01-16 DIAGNOSIS — M6281 Muscle weakness (generalized): Secondary | ICD-10-CM | POA: Diagnosis not present

## 2020-01-16 DIAGNOSIS — R262 Difficulty in walking, not elsewhere classified: Secondary | ICD-10-CM | POA: Diagnosis not present

## 2020-01-16 NOTE — Therapy (Signed)
[place], [place], [address] Phone: [phone]   Fax:  [phone]  Physical Therapy Treatment  Patient Details  Name: Matthew Aguirre MRN: [id number] Date of Birth: 12-11-1933 Referring Provider (PT): Vicie Mutters, [place]    Encounter Date: 01/16/2020   PT End of Session - 01/16/20 1137    Visit Number 5    Number of Visits 9    Date for PT Re-Evaluation 01/27/20    Authorization Type [employer]na Medicare    PT Start Time 1130    PT Stop Time 1210    PT Time Calculation (min) 40 min    Activity Tolerance Patient tolerated treatment well    Behavior During Therapy [facility] for tasks assessed/performed           Past Medical History:  Diagnosis Date  . Essential hypertension 02/15/2007  . Hyperlipidemia 07/13/2013  . Prediabetes 07/13/2013  . Prostate cancer ([place])   . Pulmonary nodule (2008 resolved on f/u)  02/15/2007  . Vitamin D deficiency 07/13/2013    Past Surgical History:  Procedure Laterality Date  . PROSTATE BIOPSY  2020  . PROSTATE BIOPSY  2014  . TONSILECTOMY/ADENOIDECTOMY WITH MYRINGOTOMY    . TREATMENT FISTULA ANAL      There were no vitals filed for this visit.   Subjective Assessment - 01/16/20 1131    Subjective Patient reports symptoms have been the same, he has been doing the new exercises which have helped the achiness in his hip.    Patient Stated Goals push lawnmower, do normal activities (feed birds) that require walking around with better balance    Currently in Pain? Yes    Pain Score 1     Pain Location Hip    Pain Orientation Left    Pain Descriptors / Indicators Aching    Pain Type Chronic pain    Pain Onset More than a month ago    Pain Frequency Constant              [facility] PT Assessment - 01/16/20 0001      Strength   Overall Strength Comments Great toe extension 0/5    Strength Assessment Site Ankle;Hip;Knee    Left Hip Flexion 4/5    Left Hip External  Rotation 4-/5    Left Hip Internal Rotation 2-/5    Left Knee Flexion 4+/5    Left Knee Extension 4+/5    Left Ankle Dorsiflexion 0/5    Left Ankle Plantar Flexion 4/5    Left Ankle Inversion 2-/5    Left Ankle Eversion 0/5                         [facility] Adult PT Treatment/Exercise - 01/16/20 0001      Exercises   Exercises Knee/Hip      Knee/Hip Exercises: Stretches   Piriformis Stretch 2 reps;30 seconds    Piriformis Stretch Limitations supine figure-4      Knee/Hip Exercises: Aerobic   Nustep L5 x 5 min with LE only      Knee/Hip Exercises: Supine   Bridges 2 sets;10 reps    Straight Leg Raises 2 sets;10 reps    Other Supine Knee/Hip Exercises Clam with red 2 x 10    Other Supine Knee/Hip Exercises Alternating march with red 2 x 20 (10 each)      Manual Therapy   Manual Therapy Soft tissue mobilization    Soft tissue mobilization FR  to left gluteal and piriformis region                  PT Education - 01/16/20 1136    Education Details HEP, likley etiology of weakness    Person(s) Educated Patient    Methods Explanation    Comprehension Verbalized understanding;Need further instruction            PT Short Term Goals - 12/26/19 1451      PT SHORT TERM GOAL #1   Title Patient will be able to perform 5 degrees active L ankle dorsiflexion.    Baseline Pt in slight plantarflexion and unable to perform active dorsiflexion.    Time 2    Period Weeks    Status New    Target Date 01/09/20      PT SHORT TERM GOAL #2   Title Pt will be able to walk around outside for 15 minutes to feed birds without falls or loss of balance using least restrictive adaptive device.    Baseline Pt currently uses single point cane but feels very unsteady when going outside to feed birds.    Time 2    Period Weeks    Status New    Target Date 01/09/20      PT SHORT TERM GOAL #3   Title Patient will increase bilateral hip flexion MMT to 4+/5 to allow for better  clearance of each LE during gait.    Baseline 4/5 BLE    Time 2    Period Weeks    Status New    Target Date 01/09/20             PT Long Term Goals - 12/26/19 1456      PT LONG TERM GOAL #1   Title Patient will improve FOTO score from 55% to 69% (from 45% limited to 31% limited).    Baseline 45% limited    Time 4    Period Weeks    Status New    Target Date 01/23/20      PT LONG TERM GOAL #2   Title Patient will be able to actively dorsiflex L ankle to at least 10 degrees to allow for functional gait pattern.    Baseline Pt unable to actively dorsiflex L ankle    Time 4    Period Weeks    Status New    Target Date 01/23/20      PT LONG TERM GOAL #3   Title Pt will be independent with HEP.    Baseline Pt received HEP following initial eval.    Time 4    Period Weeks    Status New    Target Date 01/23/20                 Plan - 01/16/20 1138    Clinical Impression Statement Patient tolerated therapy with no adverse effects. Patient continues to exhibit significant stength deficits of left ankle DF, eversion, inversion, toe extension, and left hip IR. Likely strength deficit is neurologically related and patient is waiting for results of nerve conduction study. This visit focused mainly on left hip mobility and strengthening. Patient did report he feels lift hip is stronger and better with walking. No change to HEP this visit. He would benefit from continued skilled PT to improve left hip discomfort and walking ability.    PT Treatment/Interventions ADLs/Self Care Home Management;Electrical Stimulation;Moist Heat;Neuromuscular re-education;Balance training;Therapeutic exercise;Therapeutic activities;Functional mobility training;Stair training;Gait training;Patient/family education;Manual techniques;Passive range of motion;Dry needling;Taping  PT Next Visit Plan Review HEP, begin hip/knee strengthening and balance activities (clams, hip ABD, SLS, tandem standing, etc),  soft tissue mobilization, continue sciatic nerve glides and piriformis stretch    PT Home Exercise Plan [email or web address] - sciatic nerve glides, piriformis stretch, clams, sidelying L hip ABD, supine SLR, bridges, standing B hip EXT, standing marches    Consulted and Agree with Plan of Care Patient           Patient will benefit from skilled therapeutic intervention in order to improve the following deficits and impairments:  Abnormal gait, Decreased balance, Decreased strength, Decreased mobility, Difficulty walking, Impaired sensation  Visit Diagnosis: Sciatica, left side  Foot drop, left  Muscle weakness (generalized)     Problem List Patient Active Problem List   Diagnosis Date Noted  . Aortic atherosclerosis ([place]) 09/28/2019  . Plantar fasciitis of left foot 09/21/2018  . Gastroesophageal reflux disease without esophagitis 09/21/2018  . Other abnormal glucose 02/13/2015  . BMI 23.0-23.9, adult 02/13/2015  . 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 Malignant neoplasm of prostate ([place]) 04/19/2014  . Hyperlipidemia 07/13/2013  . Vitamin D deficiency 07/13/2013  . Medication management 07/13/2013  . Essential hypertension 02/15/2007    Hilda Blades, PT, DPT, LAT, ATC 01/16/20  1:46 PM Phone: [phone] Fax: [facility] [address] [place], [place], [address] Phone: [phone]   Fax:  [phone]  Name: FOY VANDUYNE MRN: [id number] Date of Birth: [birth year]

## 2020-01-18 ENCOUNTER — Ambulatory Visit: Payer: Medicare HMO

## 2020-01-22 DIAGNOSIS — R030 Elevated blood-pressure reading, without diagnosis of hypertension: Secondary | ICD-10-CM | POA: Diagnosis not present

## 2020-01-22 DIAGNOSIS — M21372 Foot drop, left foot: Secondary | ICD-10-CM | POA: Diagnosis not present

## 2020-01-22 DIAGNOSIS — G541 Lumbosacral plexus disorders: Secondary | ICD-10-CM | POA: Diagnosis not present

## 2020-01-22 DIAGNOSIS — G5732 Lesion of lateral popliteal nerve, left lower limb: Secondary | ICD-10-CM | POA: Diagnosis not present

## 2020-01-22 DIAGNOSIS — C61 Malignant neoplasm of prostate: Secondary | ICD-10-CM | POA: Diagnosis not present

## 2020-01-23 ENCOUNTER — Ambulatory Visit: Payer: Medicare HMO | Admitting: Physical Therapy

## 2020-01-23 ENCOUNTER — Encounter: Payer: Self-pay | Admitting: Physical Therapy

## 2020-01-23 ENCOUNTER — Other Ambulatory Visit: Payer: Self-pay

## 2020-01-23 DIAGNOSIS — M6281 Muscle weakness (generalized): Secondary | ICD-10-CM | POA: Diagnosis not present

## 2020-01-23 DIAGNOSIS — M5432 Sciatica, left side: Secondary | ICD-10-CM

## 2020-01-23 DIAGNOSIS — M21372 Foot drop, left foot: Secondary | ICD-10-CM | POA: Diagnosis not present

## 2020-01-23 DIAGNOSIS — R262 Difficulty in walking, not elsewhere classified: Secondary | ICD-10-CM | POA: Diagnosis not present

## 2020-01-23 NOTE — Patient Instructions (Signed)
Access Code: [email or web address] URL: [email or web address] Date: 01/23/2020 Prepared by: Hilda Blades  Exercises Seated Sciatic Tensioner - 2 x daily - 7 x weekly - 10 reps Supine Piriformis Stretch with Foot on Ground - 2 x daily - 7 x weekly - 5 reps - 30 hold Seated Piriformis Stretch - 2 x daily - 7 x weekly - 5 reps - 30 hold Hooklying Clamshell with Resistance - 1 x daily - 7 x weekly - 2 sets - 15 reps Supine March with Resistance Band - 1 x daily - 7 x weekly - 2 sets - 10 reps Supine Bridge - 1 x daily - 7 x weekly - 2 sets - 10 reps Supine Active Straight Leg Raise - 1 x daily - 7 x weekly - 2 sets - 10 reps Seated Heel Raise - 1 x daily - 7 x weekly - 2 sets - 20 reps Sit to Stand with Arms Crossed - 1 x daily - 7 x weekly - 2 sets - 10 reps Standing Hip Abduction with Counter Support - 1 x daily - 7 x weekly - 2 sets - 10 reps Standing Hip Extension with Counter Support - 1 x daily - 7 x weekly - 2 sets - 10 reps Standing March with Counter Support - 1 x daily - 7 x weekly - 2 sets - 10 reps

## 2020-01-23 NOTE — Therapy (Signed)
[place], [place], [address] Phone: [phone]   Fax:  [phone]  Physical Therapy Treatment  Patient Details  Name: Matthew Aguirre MRN: [id number] Date of Birth: [birth year] Referring Provider (PT): Vicie Mutters, [place]    Encounter Date: 01/23/2020   PT End of Session - 01/23/20 1149    Visit Number 6    Number of Visits 9    Date for PT Re-Evaluation 01/27/20    Authorization Type [employer] Medicare    PT Start Time 1130    PT Stop Time 1212    PT Time Calculation (min) 42 min    Activity Tolerance Patient tolerated treatment well    Behavior During Therapy [facility] for tasks assessed/performed           Past Medical History:  Diagnosis Date  . Essential hypertension 02/15/2007  . Hyperlipidemia 07/13/2013  . Prediabetes 07/13/2013  . Prostate cancer ([place])   . Pulmonary nodule (2008 resolved on f/u)  02/15/2007  . Vitamin D deficiency 07/13/2013    Past Surgical History:  Procedure Laterality Date  . PROSTATE BIOPSY  2020  . PROSTATE BIOPSY  2014  . TONSILECTOMY/ADENOIDECTOMY WITH MYRINGOTOMY    . TREATMENT FISTULA ANAL      There were no vitals filed for this visit.   Subjective Assessment - 01/23/20 1136    Subjective Patient reports he saw the neurosurgeon and was told he has muscle damage that may have occured from previous radiation or the fall. He is deciding to get an assist to help with his foot drop.    Patient Stated Goals push lawnmower, do normal activities (feed birds) that require walking around with better balance    Currently in Pain? Yes    Pain Score 1     Pain Location Hip    Pain Orientation Left    Pain Descriptors / Indicators Aching    Pain Type Chronic pain    Pain Onset More than a month ago    Pain Frequency Constant              [facility] PT Assessment - 01/23/20 0001      Assessment   Medical Diagnosis Sciatica of left side    Referring Provider (PT)  Vicie Mutters, PA-C       Observation/Other Assessments   Focus on Therapeutic Outcomes ([place])  Reassess next visit      Strength   Overall Strength Comments Patient unable to achieve full left knee extension while seated due to flexibility/sciatic nerve limitation    Left Hip Flexion 4/5    Left Hip External Rotation 4-/5    Left Hip Internal Rotation 2-/5    Left Knee Flexion 4+/5    Left Knee Extension 4+/5    Left Ankle Dorsiflexion 0/5    Left Ankle Plantar Flexion 4/5    Left Ankle Inversion 2-/5    Left Ankle Eversion 0/5      Ambulation/Gait   Ambulation/Gait Yes    Ambulation/Gait Assistance 6: Modified independent (Device/Increase time)    Assistive device Straight cane   used on right side   Gait Comments Left foot drop resulting in steppage gait, trendelenburg on left                         [facility] Adult PT Treatment/Exercise - 01/23/20 0001      Exercises   Exercises Knee/Hip   reviewed current HEP program  Knee/Hip Exercises: Stretches   Piriformis Stretch 2 reps;30 seconds    Piriformis Stretch Limitations supine figure-4      Knee/Hip Exercises: Aerobic   Nustep L5 x 5 min with LE only      Knee/Hip Exercises: Seated   Long Arc Quad Limitations Trialed but patient unable to perform due to limitation in hamstring/sciatic nerve mobility    Other Seated Knee/Hip Exercises Heel raises 2 x 20    Sit to Sand 2 sets;10 reps;without UE support      Knee/Hip Exercises: Supine   Bridges with Ball Squeeze 2 sets;10 reps    Straight Leg Raises 2 sets;10 reps    Other Supine Knee/Hip Exercises Clam with red 2 x 15    Other Supine Knee/Hip Exercises Alternating march with red 2 x 20 (10 each)      Knee/Hip Exercises: Sidelying   Other Sidelying Knee/Hip Exercises Trialed perform reverse clamshell but patient unable to lift left foot up while keeping knees together                  PT Education - 01/23/20 1148    Education Details HEP  update, compression stockings for left lower limb edema    Person(s) Educated Patient    Methods Explanation;Demonstration;Verbal cues;Handout    Comprehension Verbalized understanding;Need further instruction;Returned demonstration;Verbal cues required            PT Short Term Goals - 01/23/20 1237      PT SHORT TERM GOAL #1   Title Patient will be able to perform 5 degrees active L ankle dorsiflexion.    Baseline Unable to perform active dorsiflexion    Time 2    Period Weeks    Status On-going    Target Date 01/09/20      PT SHORT TERM GOAL #2   Title Pt will be able to walk around outside for 15 minutes to feed birds without falls or loss of balance using least restrictive adaptive device.    Baseline Pt currently uses single point cane, still unsteady    Time 2    Period Weeks    Status On-going    Target Date 01/09/20      PT SHORT TERM GOAL #3   Title Patient will increase bilateral hip flexion MMT to 4+/5 to allow for better clearance of each LE during gait.    Baseline 4/5 MMT hip flexion    Time 2    Period Weeks    Status On-going    Target Date 01/09/20             PT Long Term Goals - 12/26/19 1456      PT LONG TERM GOAL #1   Title Patient will improve FOTO score from 55% to 69% (from 45% limited to 31% limited).    Baseline 45% limited    Time 4    Period Weeks    Status New    Target Date 01/23/20      PT LONG TERM GOAL #2   Title Patient will be able to actively dorsiflex L ankle to at least 10 degrees to allow for functional gait pattern.    Baseline Pt unable to actively dorsiflex L ankle    Time 4    Period Weeks    Status New    Target Date 01/23/20      PT LONG TERM GOAL #3   Title Pt will be independent with HEP.    Baseline Pt received  HEP following initial eval.    Time 4    Period Weeks    Status New    Target Date 01/23/20                 Plan - 01/23/20 1149    Clinical Impression Statement Patient tolerated  therapy with no adverse effects. He saw the neurosurgeon who told the patient he has "muscle damage" but patient presents as having nerve related muscle dysfunction resulting in weakness and drop foot on the left. He continues to have no activation of left ankle dorsiflexors and exhibits other ankle and hip strength deficits on the left that are likely neurologically related. Patient was provided a new PT referral specific for foot drop and will be tranfering his care to the [place] clinic following his next visit because it is closer to home. Therapy today focused on progressing core, hip, and general LE strength to improve patient's walking ability. He notes feeling stronger since the start of therapy but has not demonstrated any improvement in foot drop. He would benefit from continued skilled PT to improve left hip discomfort, strength, and walking ability.    PT Treatment/Interventions ADLs/Self Care Home Management;Electrical Stimulation;Moist Heat;Neuromuscular re-education;Balance training;Therapeutic exercise;Therapeutic activities;Functional mobility training;Stair training;Gait training;Patient/family education;Manual techniques;Passive range of motion;Dry needling;Taping    PT Next Visit Plan FOTO, re-cert POC for 6 more weeks, patient will transfer to [place] location for continued PT    PT Home Exercise Plan [id number] - seated sciatic never glides, supine/seated piriformis stretch, supine clam with red, supine marching with red, bridge, SLR, seated heel raises, standing hip abduction, extension, marching    Consulted and Agree with Plan of Care Patient           Patient will benefit from skilled therapeutic intervention in order to improve the following deficits and impairments:  Abnormal gait, Decreased balance, Decreased strength, Decreased mobility, Difficulty walking, Impaired sensation  Visit Diagnosis: Foot drop, left  Muscle weakness (generalized)  Sciatica, left  side     Problem List Patient Active Problem List   Diagnosis Date Noted  . Aortic atherosclerosis ([place]) 09/28/2019  . Plantar fasciitis of left foot 09/21/2018  . Gastroesophageal reflux disease without esophagitis 09/21/2018  . Other abnormal glucose 02/13/2015  . BMI 23.0-23.9, adult 02/13/2015  . 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 Malignant neoplasm of prostate ([place]) 04/19/2014  . Hyperlipidemia 07/13/2013  . Vitamin D deficiency 07/13/2013  . Medication management 07/13/2013  . Essential hypertension 02/15/2007    Hilda Blades, PT, DPT, LAT, ATC 01/23/20  12:42 PM Phone: [phone] Fax: [place] [facility] [address] [place], [place], [address] Phone: [phone]   Fax:  [phone]  Name: Matthew Aguirre MRN: [id number] Date of Birth: 01/29/1934

## 2020-01-25 ENCOUNTER — Other Ambulatory Visit: Payer: Self-pay

## 2020-01-25 ENCOUNTER — Ambulatory Visit: Payer: Medicare HMO

## 2020-01-25 DIAGNOSIS — M21372 Foot drop, left foot: Secondary | ICD-10-CM | POA: Diagnosis not present

## 2020-01-25 DIAGNOSIS — R262 Difficulty in walking, not elsewhere classified: Secondary | ICD-10-CM | POA: Diagnosis not present

## 2020-01-25 DIAGNOSIS — M6281 Muscle weakness (generalized): Secondary | ICD-10-CM | POA: Diagnosis not present

## 2020-01-25 DIAGNOSIS — M5432 Sciatica, left side: Secondary | ICD-10-CM | POA: Diagnosis not present

## 2020-01-25 NOTE — Therapy (Addendum)
[place], [place], [address] Phone: [phone]   Fax:  [phone]  Physical Therapy Treatment  Patient Details  Name: Matthew Aguirre MRN: [id number] Date of Birth: April 01, 1933 Referring Provider (PT): Vicie Mutters, [place]    Encounter Date: 01/25/2020   PT End of Session - 01/25/20 1529    Visit Number 7    Number of Visits 19    Date for PT Re-Evaluation 03/09/20    Authorization Type A[employer]na Medicare    PT Start Time 6283    PT Stop Time 1614    PT Time Calculation (min) 44 min    Activity Tolerance Patient tolerated treatment well    Behavior During Therapy [facility] for tasks assessed/performed           Past Medical History:  Diagnosis Date  . Essential hypertension 02/15/2007  . Hyperlipidemia 07/13/2013  . Prediabetes 07/13/2013  . Prostate cancer ([place])   . Pulmonary nodule (2008 resolved on f/u)  02/15/2007  . Vitamin D deficiency 07/13/2013    Past Surgical History:  Procedure Laterality Date  . PROSTATE BIOPSY  2020  . PROSTATE BIOPSY  2014  . TONSILECTOMY/ADENOIDECTOMY WITH MYRINGOTOMY    . TREATMENT FISTULA ANAL      There were no vitals filed for this visit.   Subjective Assessment - 01/25/20 1534    Subjective Patient reports that his first appointment at [facility] clinic is scheduled for next Wednesday. He states that there is no change in symptoms in L foot with numbness/tingling and foot drop still present. Pt explains that he has been able to sleep without his L hip pain recently.    Pertinent History Prostate cancer (recently completed radiation treatment), hypertension, prediabetes    Limitations House hold activities;Walking;Sitting    How long can you sit comfortably? Can sit for 30 min to 1 hour before needing to move around due to pain    How long can you stand comfortably? Pt reports no issues standing still    How long can you walk comfortably? Walking difficult due  to L drop foot    Patient Stated Goals push lawnmower, do normal activities (feed birds) that require walking around with better balance    Currently in Pain? No/denies    Pain Score 0-No pain    Pain Orientation Left    Pain Descriptors / Indicators Aching    Pain Type Chronic pain    Pain Onset More than a month ago              [facility] PT Assessment - 01/25/20 0001      Assessment   Medical Diagnosis Sciatica of left side    Referring Provider (PT) Vicie Mutters, PA-C       Observation/Other Assessments   Focus on Therapeutic Outcomes (FOTO)  51% limited; predicted 31% limited                          [facility] Adult PT Treatment/Exercise - 01/25/20 0001      Ambulation/Gait   Gait Comments Left foot drop resulting in steppage gait, trendelenburg on left      Self-Care   Self-Care Other Self-Care Comments    Other Self-Care Comments  Discussed continuing with HEP until his appointment at [facility] clinic next Wednesday. Pt instructed to take his L drop foot AFO with him to his next PT appointment if he has difficulty donning/doffing it after he  mentioned that it will be delivered sometime this weekend.      Knee/Hip Exercises: Stretches   Piriformis Stretch 2 reps;30 seconds    Piriformis Stretch Limitations supine figure-4      Knee/Hip Exercises: Aerobic   Nustep L5 x 5 min with LE only      Knee/Hip Exercises: Standing   Heel Raises Both;20 reps    Hip Abduction Stengthening;Left;20 reps;Knee straight    Other Standing Knee Exercises Tandem standing on foam 2 x 15 seconds; standing with feet shoulder width apart then narrow BOS and EC 2 x 15 seconds each; standing on BOSU (blue) for 2 minutes with occasional BUE support to maintain balance      Knee/Hip Exercises: Seated   Other Seated Knee/Hip Exercises --    Sit to Sand 20 reps;without UE support   holding blue weighted ball     Knee/Hip Exercises: Supine   Bridges Strengthening;Both;15 reps     Bridges Limitations With B hip ABD (red theraband around above knees)    Bridges with [employer] --    Straight Leg Raises 2 sets;10 reps    Other Supine Knee/Hip Exercises Clam with red 2 x 15    Other Supine Knee/Hip Exercises Alternating march with red 2 x 20 (10 each)                  PT Education - 01/25/20 1722    Education Details Discussed continuing with HEP until his appointment at [facility] clinic next Wednesday. Pt instructed to take his L drop foot AFO with him to his next PT appointment if he has difficulty donning/doffing it after he mentioned that it will be delivered sometime this weekend.    Person(s) Educated Patient    Methods Explanation;Demonstration;Tactile cues;Verbal cues    Comprehension Verbalized understanding;Returned demonstration;Verbal cues required;Tactile cues required            PT Short Term Goals - 01/25/20 1726      PT SHORT TERM GOAL #1   Title Patient will be able to perform 5 degrees active L ankle dorsiflexion.    Baseline Unable to perform active dorsiflexion    Time 2    Period Weeks    Status On-going    Target Date 01/09/20      PT SHORT TERM GOAL #2   Title Pt will be able to walk around outside for 15 minutes to feed birds without falls or loss of balance using least restrictive adaptive device.    Baseline Pt currently uses single point cane, still unsteady    Time 2    Period Weeks    Status On-going    Target Date 01/09/20      PT SHORT TERM GOAL #3   Title Patient will increase bilateral hip flexion MMT to 4+/5 to allow for better clearance of each LE during gait.    Baseline 4/5 MMT hip flexion    Time 2    Period Weeks    Status On-going    Target Date 01/09/20             PT Long Term Goals - 01/25/20 1726      PT LONG TERM GOAL #1   Title Patient will improve FOTO score from 55% to 69% (from 45% limited to 31% limited).    Baseline 45% limited; update: 51% limited    Time 4    Period Weeks     Status On-going  PT LONG TERM GOAL #2   Title Patient will be able to actively dorsiflex L ankle to at least 10 degrees to allow for functional gait pattern.    Baseline Pt unable to actively dorsiflex L ankle    Time 4    Period Weeks    Status On-going      PT LONG TERM GOAL #3   Title Pt will be independent with HEP.    Baseline Pt received HEP following initial eval.    Time 4    Period Weeks    Status On-going                 Plan - 01/25/20 1728    Clinical Impression Statement Patient tolerated treatment session with no adverse effects or complaints of significant increase in pain. Patient will be completing skilled PT at [facility] clinic beginning next Wednesday (01/31/2020) due to that location being closer to home. Today's session continued focus on core/LE strengthening and balance to allow for improved walking ability and steadiness during activity. Patient expresses being pleased with progress thus far regarding his L hip discomfort but states he is trying not to feel discouraged due to continued L foot drop. Patient will benefit from continued skilled PT intervention to improve strength and return to walking outside regularly as desired.    Personal Factors and Comorbidities Age;Comorbidity 1;Comorbidity 2    Comorbidities Prostate cancer (recently completed radiation treatment), hypertension, prediabetes    Examination-Activity Limitations Sit;Stairs;Locomotion Level    Examination-Participation Restrictions Other    Stability/Clinical Decision Making Evolving/Moderate complexity    Rehab Potential Good    PT Frequency 2x / week    PT Duration 4 weeks    PT Treatment/Interventions ADLs/Self Care Home Management;Electrical Stimulation;Moist Heat;Neuromuscular re-education;Balance training;Therapeutic exercise;Therapeutic activities;Functional mobility training;Stair training;Gait training;Patient/family education;Manual techniques;Passive range of motion;Dry  needling;Taping    PT Next Visit Plan Patient will transfer to [place] location for continued PT    PT Home Exercise Plan [id number] - seated sciatic never glides, supine/seated piriformis stretch, supine clam with red, supine marching with red, bridge, SLR, seated heel raises, standing hip abduction, extension, marching    Consulted and Agree with Plan of Care Patient           Patient will benefit from skilled therapeutic intervention in order to improve the following deficits and impairments:  Abnormal gait, Decreased balance, Decreased strength, Decreased mobility, Difficulty walking, Impaired sensation  Visit Diagnosis: Foot drop, left - Plan: PT plan of care cert/re-cert  Muscle weakness (generalized) - Plan: PT plan of care cert/re-cert  Sciatica, left side - Plan: PT plan of care cert/re-cert     Problem List Patient Active Problem List   Diagnosis Date Noted  . Aortic atherosclerosis ([place]) 09/28/2019  . Plantar fasciitis of left foot 09/21/2018  . Gastroesophageal reflux disease without esophagitis 09/21/2018  . Other abnormal glucose 02/13/2015  . BMI 23.0-23.9, adult 02/13/2015  . 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 Malignant neoplasm of prostate ([place]) 04/19/2014  . Hyperlipidemia 07/13/2013  . Vitamin D deficiency 07/13/2013  . Medication management 07/13/2013  . Essential hypertension 02/15/2007     Haydee Monica, PT, DPT 01/25/20 5:43 PM  [place] [facility] [address] [place], [place], [address] Phone: [phone]   Fax:  [phone]  Name: Matthew Aguirre MRN: [id number] Date of Birth: Jun 19, 1933

## 2020-01-28 ENCOUNTER — Encounter: Payer: Self-pay | Admitting: Internal Medicine

## 2020-01-28 NOTE — Patient Instructions (Signed)

## 2020-01-28 NOTE — Progress Notes (Signed)
Annual  Screening/Preventative Visit  & Comprehensive Evaluation & Examination      This very nice 86 y.o.  WWM  presents for a Screening /Preventative Visit & comprehensive evaluation and management of multiple medical co-morbidities.  Patient has been followed for HTN, HLD, Prediabetes and Vitamin D Deficiency.   Patient has GERD controlled on his meds. Patient has Aortic Atherosclerosis by CT scan 2021.      Patient relates hx/o a fall on Sept 27th  with consequent Left foot drop and had a Lumbar MRI  On Sept 29th.       Patient has Gleason 6 Prostate Ca  (2014) & is on finasteride followed by Dr Junious Silk.       HTN predates circa 2003. Patient's BP has been controlled at home.  Today's BP is at goal - 136/84. Patient denies any cardiac symptoms as chest pain, palpitations, shortness of breath, dizziness or ankle swelling.      Patient is Statin Intolerant and Zetia & his  hyperlipidemia is marginally controlled with diet. Last lipids were near goal except elevated Trig's:  Lab Results  Component Value Date   CHOL 169 10/02/2019   HDL 36 (L) 10/02/2019   LDLCALC 100 (H) 10/02/2019   TRIG 213 (H) 10/02/2019   CHOLHDL 4.7 10/02/2019       Patient has hx/o prediabetes (A1c 5.8% /2008) and patient denies reactive hypoglycemic symptoms, visual blurring, diabetic polys or paresthesias. Last A1c was Normal & at goal:   Lab Results  Component Value Date   HGBA1C 5.5 12/28/2018        Finally, patient has history of Vitamin D Deficiency ("45 /2008)and last vitamin D was at goal:   Lab Results  Component Value Date   VD25OH 81 12/28/2018    Current Outpatient Medications on File Prior to Visit  Medication Sig  . acetaminophen  500 MG tablet Take 500 mg  as needed   . aspirin 81 MG EC tablet Take daily  . VITAMIN B COMPLEX  Take  daily.  . Calcium Carbonate  600  Take daily.  Marland Kitchen VITAMIN D  2,000 Units Take  2  times daily.   . famotidine 20 MG tablet Take at bedtime.  Marland Kitchen  FLAX SEED OIL 1,200 mg Take   3  times daily.   Marland Kitchen gabapentin  100 MG capsule Take 1 to 3 capsules at Bedtime   . leuprolide, (ELIGARD) 45 MG injec Inject 4 5 mg  every 6 months.  . Magnesium 250 MG TABS Take 2 tabs  daily.  . metoprolol tartrate 50 MG tablet Take 1 tablet  2 times daily.  . Omega-3 FISH OIL 1,200 mg Take   times daily.   . pantoprazole 40 MG tablet Take  1 tablet  Daily       Allergies  Allergen Reactions  . Pravastatin   . Red Yeast Rice [Cholestin]   . Sulfa Antibiotics   . Vibramycin [Doxycycline Calcium]   . Zetia [Ezetimibe]     Past Medical History:  Diagnosis Date  . Essential hypertension 02/15/2007  . Hyperlipidemia 07/13/2013  . Prediabetes 07/13/2013  . Prostate cancer ([place])   . Pulmonary nodule (2008 resolved on f/u)  02/15/2007  . Vitamin D deficiency 07/13/2013   Health Maintenance  Topic Date Due  . INFLUENZA VACCINE  10/15/2019  . TETANUS/TDAP  05/16/2025  . COVID-19 Vaccine  Completed  . PNA vac Low Risk Adult  Completed   Immunization History  Administered Date(s)  Administered  . DT (Pediatric) 05/17/2015  . Influenza, High Dose Seasonal PF 01/23/2014, 02/13/2015, 11/27/2015, 12/01/2016, 12/01/2017, 01/05/2019  . Moderna SARS-COVID-2 Vaccination 05/01/2019, 05/30/2019  . Pneumococcal Conjugate-13 10/12/2013  . Pneumococcal Polysaccharide-23 05/17/2015  . Zoster Recombinat (Shingrix) 08/13/2017, 10/14/2017   Last Colon -  03/2002 - Dr Deatra Ina - and aged out  [place] - 12/12/2017 - Negative - and now aged out also (over age 40)   Past Surgical History:  Procedure Laterality Date  . PROSTATE BIOPSY  2020  . PROSTATE BIOPSY  2014  . TONSILECTOMY/ADENOIDECTOMY WITH MYRINGOTOMY    . TREATMENT FISTULA ANAL     Family History  Problem Relation Age of Onset  . Hyperlipidemia Brother   . Hypertension Brother   . Breast cancer Neg Hx   . Colon cancer Neg Hx   . Prostate cancer Neg Hx   . Pancreatic cancer Neg Hx    Social History    Socioeconomic History  . Marital status: Widowed  . 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 Smoking status: Never Smoker  . Smokeless tobacco: Never Used  Vaping Use  . Vaping Use: Never used  Substance and Sexual Activity  . Alcohol use: No  . Drug use: Never  . Sexual activity: No     ROS Constitutional: Denies fever, chills, weight loss/gain, headaches, insomnia,  night sweats or change in appetite. Does c/o fatigue. Eyes: Denies redness, blurred vision, diplopia, discharge, itchy or watery eyes.  ENT: Denies discharge, congestion, post nasal drip, epistaxis, sore throat, earache, hearing loss, dental pain, Tinnitus, Vertigo, Sinus pain or snoring.  Cardio: Denies chest pain, palpitations, irregular heartbeat, syncope, dyspnea, diaphoresis, orthopnea, PND, claudication or edema Respiratory: denies cough, dyspnea, DOE, pleurisy, hoarseness, laryngitis or wheezing.  Gastrointestinal: Denies dysphagia, heartburn, reflux, water brash, pain, cramps, nausea, vomiting, bloating, diarrhea, constipation, hematemesis, melena, hematochezia, jaundice or hemorrhoids Genitourinary: Denies dysuria, frequency, urgency, nocturia, hesitancy, discharge, hematuria or flank pain Musculoskeletal: Denies arthralgia, myalgia, stiffness, Jt. Swelling, pain, limp or strain/sprain. Denies Falls. Skin: Denies puritis, rash, hives, warts, acne, eczema or change in skin lesion Neuro: No weakness, tremor, incoordination, spasms, paresthesia or pain Psychiatric: Denies confusion, memory loss or sensory loss. Denies Depression. Endocrine: Denies change in weight, skin, hair change, nocturia, and paresthesia, diabetic polys, visual blurring or hyper / hypo glycemic episodes.  Heme/Lymph: No excessive bleeding, bruising or enlarged lymph nodes.  Physical Exam  BP 136/84   Pulse 85   Temp (!) 97.4 F (36.3 C)   Resp 16   Ht 5\' 6"   (1.676 m)   Wt 148 lb 3.2 oz (67.2 kg)   BMI 23.92 kg/m   General Appearance: Well nourished and well groomed and in no apparent distress.  Eyes: PERRLA, EOMs, conjunctiva no swelling or erythema, normal fundi and vessels. Sinuses: No frontal/maxillary tenderness ENT/Mouth: EACs patent / TMs  nl. Nares clear without erythema, swelling, mucoid exudates. Oral hygiene is good. No erythema, swelling, or exudate. Tongue normal, non-obstructing. Tonsils not swollen or erythematous. Hearing normal.  Neck: Supple, thyroid not palpable. No bruits, nodes or JVD. Respiratory: Respiratory effort normal.  BS equal and clear bilateral without rales, rhonci, wheezing or stridor. Cardio: Heart sounds are normal with regular rate and rhythm and no murmurs, rubs or gallops. Peripheral pulses are normal and equal bilaterally without edema. No aortic or femoral bruits. Chest: symmetric with normal excursions and percussion.  Abdomen: Soft, with Nl  bowel sounds. Nontender, no guarding, rebound, hernias, masses, or organomegaly.  Lymphatics: Non tender without lymphadenopathy.  Musculoskeletal: Full ROM all peripheral extremities, joint stability, 5/5 strength, and normal gait. Skin: Warm and dry without rashes, lesions, cyanosis, clubbing or  ecchymosis.  Neuro: Cranial nerves intact, reflexes equal bilaterally. Normal muscle tone, no cerebellar symptoms. Sensation  On the distal LLE  - mid shin -decreased to touch to the toes. Pysch: Alert and oriented X 3 with normal affect, insight and judgment appropriate.   Assessment and Plan  1. Annual Preventative/Screening Exam    2. Essential hypertension  - EKG 12-Lead - [place], RETROPERITNL ABD,  LTD - Urinalysis, Routine w reflex microscopic - Microalbumin / creatinine urine ratio - CBC with Differential/Platelet - COMPLETE METABOLIC PANEL WITH GFR - Magnesium - TSH  3. Hyperlipidemia, mixed  - EKG 12-Lead - [place], RETROPERITNL ABD,  LTD - Lipid panel -  TSH  4. Abnormal glucose  - EKG 12-Lead - [place], RETROPERITNL ABD,  LTD - Hemoglobin A1c - Insulin, random  5. Vitamin D deficiency  - VITAMIN D 25 Hydroxy  6. Aortic atherosclerosis (HCC)  - EKG 12-Lead - [place], RETROPERITNL ABD,  LTD  7. Screening for colorectal cancer  - POC Hemoccult Bld/Stl   8. Gastroesophageal reflux disease  - CBC with Differential/Platelet  9. Malignant neoplasm of prostate ([place])   10. Screening for ischemic heart disease  - EKG 12-Lead  11. FH: hypertension  - EKG 12-Lead - [place], RETROPERITNL ABD,  LTD  12. Screening for AAA (aortic abdominal aneurysm)  - [place], RETROPERITNL ABD,  LTD  13. Medication management  - Urinalysis, Routine w reflex microscopic - Microalbumin / creatinine urine ratio - CBC with Differential/Platelet - COMPLETE METABOLIC PANEL WITH GFR - Magnesium - Lipid panel - TSH - Hemoglobin A1c - Insulin, random - VITAMIN D 25 Hydroxy   14. Need for immunization against influenza  - Flu vaccine HIGH DOSE PF (Fluzone High dose)            Patient was counseled in prudent diet, weight control to achieve/maintain BMI less than 25, BP monitoring, regular exercise and medications as discussed.  Discussed med effects and SE's. Routine screening labs and tests as requested with regular follow-up as recommended. Over 40 minutes of exam, counseling, chart review and high complex critical decision making was performed   Kirtland Bouchard, MD

## 2020-01-29 ENCOUNTER — Ambulatory Visit (INDEPENDENT_AMBULATORY_CARE_PROVIDER_SITE_OTHER): Payer: Medicare HMO | Admitting: Internal Medicine

## 2020-01-29 ENCOUNTER — Other Ambulatory Visit: Payer: Self-pay

## 2020-01-29 VITALS — BP 136/84 | HR 85 | Temp 97.4°F | Resp 16 | Ht 66.0 in | Wt 148.2 lb

## 2020-01-29 DIAGNOSIS — Z1211 Encounter for screening for malignant neoplasm of colon: Secondary | ICD-10-CM

## 2020-01-29 DIAGNOSIS — C61 Malignant neoplasm of prostate: Secondary | ICD-10-CM

## 2020-01-29 DIAGNOSIS — E782 Mixed hyperlipidemia: Secondary | ICD-10-CM

## 2020-01-29 DIAGNOSIS — Z8249 Family history of ischemic heart disease and other diseases of the circulatory system: Secondary | ICD-10-CM

## 2020-01-29 DIAGNOSIS — R7309 Other abnormal glucose: Secondary | ICD-10-CM | POA: Diagnosis not present

## 2020-01-29 DIAGNOSIS — E559 Vitamin D deficiency, unspecified: Secondary | ICD-10-CM

## 2020-01-29 DIAGNOSIS — Z23 Encounter for immunization: Secondary | ICD-10-CM | POA: Diagnosis not present

## 2020-01-29 DIAGNOSIS — Z6823 Body mass index (BMI) 23.0-23.9, adult: Secondary | ICD-10-CM

## 2020-01-29 DIAGNOSIS — Z0000 Encounter for general adult medical examination without abnormal findings: Secondary | ICD-10-CM

## 2020-01-29 DIAGNOSIS — K219 Gastro-esophageal reflux disease without esophagitis: Secondary | ICD-10-CM

## 2020-01-29 DIAGNOSIS — Z136 Encounter for screening for cardiovascular disorders: Secondary | ICD-10-CM | POA: Diagnosis not present

## 2020-01-29 DIAGNOSIS — I700 Atherosclerosis of aorta: Secondary | ICD-10-CM

## 2020-01-29 DIAGNOSIS — Z0001 Encounter for general adult medical examination with abnormal findings: Secondary | ICD-10-CM

## 2020-01-29 DIAGNOSIS — Z79899 Other long term (current) drug therapy: Secondary | ICD-10-CM

## 2020-01-29 DIAGNOSIS — I10 Essential (primary) hypertension: Secondary | ICD-10-CM

## 2020-01-29 DIAGNOSIS — Z1212 Encounter for screening for malignant neoplasm of rectum: Secondary | ICD-10-CM

## 2020-01-30 LAB — URINALYSIS, ROUTINE W REFLEX MICROSCOPIC
Bilirubin Urine: NEGATIVE
Glucose, UA: NEGATIVE
Hgb urine dipstick: NEGATIVE
Ketones, ur: NEGATIVE
Leukocytes,Ua: NEGATIVE
Nitrite: NEGATIVE
Protein, ur: NEGATIVE
Specific Gravity, Urine: 1.019 (ref 1.001–1.03)
pH: 6 (ref 5.0–8.0)

## 2020-01-30 LAB — MICROALBUMIN / CREATININE URINE RATIO
Creatinine, Urine: 134 mg/dL (ref 20–320)
Microalb Creat Ratio: 6 mcg/mg creat (ref <30)
Microalb, Ur: 0.8 mg/dL

## 2020-01-30 LAB — HEMOGLOBIN A1C
Hgb A1c MFr Bld: 5.5 % of total Hgb (ref <5.7)
Mean Plasma Glucose: 111 (calc)
eAG (mmol/L): 6.2 (calc)

## 2020-01-30 LAB — CBC WITH DIFFERENTIAL/PLATELET
Absolute Monocytes: 515 cells/uL (ref 200–950)
Basophils Absolute: 22 cells/uL (ref 0–200)
Basophils Relative: 0.5 %
Eosinophils Absolute: 31 cells/uL (ref 15–500)
Eosinophils Relative: 0.7 %
HCT: 42.2 % (ref 38.5–50.0)
Hemoglobin: 14.2 g/dL (ref 13.2–17.1)
Lymphs Abs: 378 cells/uL — ABNORMAL LOW (ref 850–3900)
MCH: 31.3 pg (ref 27.0–33.0)
MCHC: 33.6 g/dL (ref 32.0–36.0)
MCV: 93 fL (ref 80.0–100.0)
MPV: 11.3 fL (ref 7.5–12.5)
Monocytes Relative: 11.7 %
Neutro Abs: 3454 cells/uL (ref 1500–7800)
Neutrophils Relative %: 78.5 %
Platelets: 170 10*3/uL (ref 140–400)
RBC: 4.54 10*6/uL (ref 4.20–5.80)
RDW: 12.1 % (ref 11.0–15.0)
Total Lymphocyte: 8.6 %
WBC: 4.4 10*3/uL (ref 3.8–10.8)

## 2020-01-30 LAB — LIPID PANEL
Cholesterol: 159 mg/dL (ref <200)
HDL: 36 mg/dL — ABNORMAL LOW (ref >=40)
LDL Cholesterol (Calc): 90 mg/dL (calc)
Non-HDL Cholesterol (Calc): 123 mg/dL (calc) (ref <130)
Total CHOL/HDL Ratio: 4.4 (calc) (ref <5.0)
Triglycerides: 254 mg/dL — ABNORMAL HIGH (ref <150)

## 2020-01-30 LAB — COMPLETE METABOLIC PANEL WITH GFR
AG Ratio: 2.6 (calc) — ABNORMAL HIGH (ref 1.0–2.5)
ALT: 24 U/L (ref 9–46)
AST: 17 U/L (ref 10–35)
Albumin: 4.2 g/dL (ref 3.6–5.1)
Alkaline phosphatase (APISO): 52 U/L (ref 35–144)
BUN: 18 mg/dL (ref 7–25)
CO2: 29 mmol/L (ref 20–32)
Calcium: 9.5 mg/dL (ref 8.6–10.3)
Chloride: 106 mmol/L (ref 98–110)
Creat: 0.88 mg/dL (ref 0.70–1.11)
GFR, Est African American: 91 mL/min/{1.73_m2} (ref >=60)
GFR, Est Non African American: 79 mL/min/{1.73_m2} (ref >=60)
Globulin: 1.6 g/dL (calc) — ABNORMAL LOW (ref 1.9–3.7)
Glucose, Bld: 94 mg/dL (ref 65–99)
Potassium: 4.8 mmol/L (ref 3.5–5.3)
Sodium: 142 mmol/L (ref 135–146)
Total Bilirubin: 0.4 mg/dL (ref 0.2–1.2)
Total Protein: 5.8 g/dL — ABNORMAL LOW (ref 6.1–8.1)

## 2020-01-30 LAB — VITAMIN D 25 HYDROXY (VIT D DEFICIENCY, FRACTURES): Vit D, 25-Hydroxy: 83 ng/mL (ref 30–100)

## 2020-01-30 LAB — TSH: TSH: 0.82 mIU/L (ref 0.40–4.50)

## 2020-01-30 LAB — INSULIN, RANDOM: Insulin: 20.2 u[IU]/mL — ABNORMAL HIGH

## 2020-01-30 LAB — MAGNESIUM: Magnesium: 2.1 mg/dL (ref 1.5–2.5)

## 2020-01-30 NOTE — Progress Notes (Signed)
========================================================== -   Test results slightly outside the reference range are not unusual. If there is anything important, I will review this with you,  otherwise it is considered normal test values.  If you have further questions,  please do not hesitate to contact me at the office or via My Chart.  ==========================================================  -  Total Chol = 159 -  Excellent   - Very low risk for Heart Attack  / Stroke =============================================================  - Triglycerides (   254   ) or fats in blood are too high  (goal is less than 150)    - Recommend avoid fried & greasy foods,  sweets / candy,   - Avoid white rice  (brown or wild rice or Quinoa is OK),   - Avoid white potatoes  (sweet potatoes are OK)   - Avoid anything made from white flour  - bagels, doughnuts, rolls, buns, biscuits, white and   wheat breads, pizza crust and traditional  pasta made of white flour & egg white  - (vegetarian pasta or spinach or wheat pasta is OK).    - Multi-grain bread is OK - like multi-grain flat bread or  sandwich thins.   - Avoid alcohol in excess.   - Exercise is also important. ==========================================================  - A1c -Normal - Great - No Diabetes ==========================================================  - Vitamin D = 83 - excellent - Vitamin D goal is between 70-100.   - Please continue your Vitamin D the same   - It is very important as a natural anti-inflammatory and helping the  immune system protect against viral infections, like the Covid-19    helping hair, skin, and nails, as well as reducing stroke and  heart attack risk.   - It helps your bones and helps with mood.  - It also decreases numerous cancer risks so please  take it as directed.   - Low Vit D is associated with a 200-300% higher risk for  CANCER   and 200-300% higher risk for HEART    ATTACK  &  STROKE.    - It is also associated with higher death rate at younger ages,   autoimmune diseases like Rheumatoid arthritis, Lupus,  Multiple Sclerosis.     - Also many other serious conditions, like depression, Alzheimer's  Dementia, infertility, muscle aches, fatigue, fibromyalgia   - just to name a few. ==========================================================  - All Else - CBC - Kidneys - Electrolytes - Liver - Magnesium & Thyroid    - all  Normal / OK ====================================================   - Keep up    the Great Work  ! ! !  ==========================================================

## 2020-01-31 ENCOUNTER — Encounter: Payer: Self-pay | Admitting: Physical Therapy

## 2020-01-31 ENCOUNTER — Ambulatory Visit: Payer: Medicare HMO | Admitting: Physical Therapy

## 2020-01-31 ENCOUNTER — Other Ambulatory Visit: Payer: Self-pay

## 2020-01-31 DIAGNOSIS — M5432 Sciatica, left side: Secondary | ICD-10-CM | POA: Diagnosis not present

## 2020-01-31 DIAGNOSIS — M21372 Foot drop, left foot: Secondary | ICD-10-CM

## 2020-01-31 DIAGNOSIS — R262 Difficulty in walking, not elsewhere classified: Secondary | ICD-10-CM | POA: Diagnosis not present

## 2020-01-31 DIAGNOSIS — M6281 Muscle weakness (generalized): Secondary | ICD-10-CM | POA: Diagnosis not present

## 2020-01-31 NOTE — Therapy (Signed)
[address] [place], [place], [address] Phone: [phone]   Fax:  [phone]  Physical Therapy Treatment  Patient Details  Name: Matthew Aguirre MRN: [id number] Date of Birth: [birth year] Referring Provider (PT): Vicie Mutters, [place]    Encounter Date: 01/31/2020   PT End of Session - 01/31/20 1152    Visit Number 8    Number of Visits 19    Date for PT Re-Evaluation 03/09/20    Authorization Type Aetna Medicare    PT Start Time 1013    PT Stop Time 1058    PT Time Calculation (min) 45 min    Activity Tolerance Patient tolerated treatment well    Behavior During Therapy [facility] for tasks assessed/performed           Past Medical History:  Diagnosis Date  . Essential hypertension 02/15/2007  . Hyperlipidemia 07/13/2013  . Prediabetes 07/13/2013  . Prostate cancer ([place])   . Pulmonary nodule (2008 resolved on f/u)  02/15/2007  . Vitamin D deficiency 07/13/2013    Past Surgical History:  Procedure Laterality Date  . PROSTATE BIOPSY  2020  . PROSTATE BIOPSY  2014  . TONSILECTOMY/ADENOIDECTOMY WITH MYRINGOTOMY    . TREATMENT FISTULA ANAL      There were no vitals filed for this visit.   Subjective Assessment - 01/31/20 1011    Subjective Patient transfers his PT care to our [facility] location, he was being seen for left hip pain and left foot drop.  He reports that his left hip pain is doing better but still feels weak, he has significant foot drop that started in September.  He is unsure of why the foot drop, possible from radiation or a fall in September.    Currently in Pain? No/denies                             [facility] Adult PT Treatment/Exercise - 01/31/20 0001      Ambulation/Gait   Gait Comments worked on stairs step over step 4" and 6"      High Level Balance   High Level Balance Activities Side stepping    High Level Balance Comments on airex ball toss, right foot on airex  fall toss, airex marching with paralell bars      Knee/Hip Exercises: Stretches   Passive Hamstring Stretch Left;5 reps;20 seconds    Passive Hamstring Stretch Limitations in sitting    Gastroc Stretch Left;5 reps;10 seconds      Knee/Hip Exercises: Aerobic   Nustep L5 x 6 min with LE only      Knee/Hip Exercises: Machines for Strengthening   Cybex Knee Extension 5# 2x10 very eak could not get full TKE    Cybex Knee Flexion 20# 2x10    Cybex Leg Press no weight left only, 20 # both, did 3 sets of 5 each      Knee/Hip Exercises: Standing   Lateral Step Up Left;3 sets;5 reps;Step Height: 4";Hand Hold: 2    Lateral Step Up Limitations a lot of cues to not push off with the right calf                    PT Short Term Goals - 01/25/20 1726      PT SHORT TERM GOAL #1   Title Patient will be able to perform 5 degrees active L ankle dorsiflexion.    Baseline Unable  to perform active dorsiflexion    Time 2    Period Weeks    Status On-going    Target Date 01/09/20      PT SHORT TERM GOAL #2   Title Pt will be able to walk around outside for 15 minutes to feed birds without falls or loss of balance using least restrictive adaptive device.    Baseline Pt currently uses single point cane, still unsteady    Time 2    Period Weeks    Status On-going    Target Date 01/09/20      PT SHORT TERM GOAL #3   Title Patient will increase bilateral hip flexion MMT to 4+/5 to allow for better clearance of each LE during gait.    Baseline 4/5 MMT hip flexion    Time 2    Period Weeks    Status On-going    Target Date 01/09/20             PT Long Term Goals - 01/31/20 1156      PT LONG TERM GOAL #1   Title Patient will improve FOTO score from 55% to 69% (from 45% limited to 31% limited).    Status On-going      PT LONG TERM GOAL #2   Title Patient will be able to actively dorsiflex L ankle to at least 10 degrees to allow for functional gait pattern.    Status On-going       PT LONG TERM GOAL #3   Title Pt will be independent with HEP.    Status On-going                 Plan - 01/31/20 1153    Clinical Impression Statement Patient does not like the stretching with the calf and HS, c/o pain , really tried to avoid the stretch, but I could get him to tolerate about 10 seconds at a time.  He has severe drop foot on the left, I do feel that I could see some left ankle toe extension with him being challenged on the airex with balance.  He is pretty timid with activities and seems scared    PT Next Visit Plan work on strength, flexibility and balance    Consulted and Agree with Plan of Care Patient           Patient will benefit from skilled therapeutic intervention in order to improve the following deficits and impairments:  Abnormal gait, Decreased balance, Decreased strength, Decreased mobility, Difficulty walking, Impaired sensation  Visit Diagnosis: Muscle weakness (generalized)  Foot drop, left  Sciatica, left side  Difficulty in walking, not elsewhere classified     Problem List Patient Active Problem List   Diagnosis Date Noted  . Aortic atherosclerosis ([place]) 09/28/2019  . Plantar fasciitis of left foot 09/21/2018  . Gastroesophageal reflux disease without esophagitis 09/21/2018  . Other abnormal glucose 02/13/2015  . BMI 23.0-23.9, adult 02/13/2015  . 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 Malignant neoplasm of prostate ([place]) 04/19/2014  . Hyperlipidemia 07/13/2013  . Vitamin D deficiency 07/13/2013  . Medication management 07/13/2013  . Essential hypertension 02/15/2007    Sumner Boast., PT 01/31/2020, 11:56 AM  [place]. [place], [place], [address] Phone: [phone]   Fax:  [phone]  Name: JAIQUAN TEMME MRN: [id number] Date of Birth: [birth year]

## 2020-02-05 ENCOUNTER — Encounter: Payer: Self-pay | Admitting: Physical Therapy

## 2020-02-05 ENCOUNTER — Other Ambulatory Visit: Payer: Self-pay

## 2020-02-05 ENCOUNTER — Ambulatory Visit: Payer: Medicare HMO | Admitting: Physical Therapy

## 2020-02-05 DIAGNOSIS — R262 Difficulty in walking, not elsewhere classified: Secondary | ICD-10-CM

## 2020-02-05 DIAGNOSIS — M5432 Sciatica, left side: Secondary | ICD-10-CM | POA: Diagnosis not present

## 2020-02-05 DIAGNOSIS — M6281 Muscle weakness (generalized): Secondary | ICD-10-CM

## 2020-02-05 DIAGNOSIS — M21372 Foot drop, left foot: Secondary | ICD-10-CM

## 2020-02-05 NOTE — Therapy (Signed)
[address]. [place], [place], [address] Phone: [phone]   Fax:  [phone]  Physical Therapy Treatment  Patient Details  Name: Matthew Aguirre MRN: [id number] Date of Birth: 29-May-1933 Referring Provider (PT): Vicie Mutters, [place]    Encounter Date: 02/05/2020   PT End of Session - 02/05/20 1016    Visit Number 9    Number of Visits 19    Authorization Type Aetna Medicare    PT Start Time 0930    PT Stop Time 1015    PT Time Calculation (min) 45 min    Equipment Utilized During Treatment Gait belt    Activity Tolerance Patient tolerated treatment well    Behavior During Therapy [facility] for tasks assessed/performed           Past Medical History:  Diagnosis Date  . Essential hypertension 02/15/2007  . Hyperlipidemia 07/13/2013  . Prediabetes 07/13/2013  . Prostate cancer ([place])   . Pulmonary nodule (2008 resolved on f/u)  02/15/2007  . Vitamin D deficiency 07/13/2013    Past Surgical History:  Procedure Laterality Date  . PROSTATE BIOPSY  2020  . PROSTATE BIOPSY  2014  . TONSILECTOMY/ADENOIDECTOMY WITH MYRINGOTOMY    . TREATMENT FISTULA ANAL      There were no vitals filed for this visit.   Subjective Assessment - 02/05/20 0936    Subjective "I keep on going" Wore his ankle brace to treatment today    Currently in Pain? No/denies                             [facility] Adult PT Treatment/Exercise - 02/05/20 0001      Ambulation/Gait   Stairs Yes    Stairs Assistance 5: Supervision;6: Modified independent (Device/Increase time)   min to mod cues for sequence   Stair Management Technique Two rails;Step to pattern    Gait Comments working on LLE foot clearance      High Level Balance   High Level Balance Comments Ball kicks, SLS, Marching on airex, L hip flex then knee ext, Step up and over on airex,Standing pertubations in parallel bars       Knee/Hip Exercises: Standing   Other  Standing Knee Exercises Heel raise toe raise then stand from mat table x10   Some DF assist    Other Standing Knee Exercises Toe and feel raises in parallel bars, Forward and lateral 4 inch box taps       Knee/Hip Exercises: Seated   Other Seated Knee/Hip Exercises Toe raises LLE with assist and facilitation x10     Other Seated Knee/Hip Exercises Toe raises with Tband assist x10                     PT Short Term Goals - 01/25/20 1726      PT SHORT TERM GOAL #1   Title Patient will be able to perform 5 degrees active L ankle dorsiflexion.    Baseline Unable to perform active dorsiflexion    Time 2    Period Weeks    Status On-going    Target Date 01/09/20      PT SHORT TERM GOAL #2   Title Pt will be able to walk around outside for 15 minutes to feed birds without falls or loss of balance using least restrictive adaptive device.    Baseline Pt currently uses single point cane, still unsteady  Time 2    Period Weeks    Status On-going    Target Date 01/09/20      PT SHORT TERM GOAL #3   Title Patient will increase bilateral hip flexion MMT to 4+/5 to allow for better clearance of each LE during gait.    Baseline 4/5 MMT hip flexion    Time 2    Period Weeks    Status On-going    Target Date 01/09/20             PT Long Term Goals - 01/31/20 1156      PT LONG TERM GOAL #1   Title Patient will improve FOTO score from 55% to 69% (from 45% limited to 31% limited).    Status On-going      PT LONG TERM GOAL #2   Title Patient will be able to actively dorsiflex L ankle to at least 10 degrees to allow for functional gait pattern.    Status On-going      PT LONG TERM GOAL #3   Title Pt will be independent with HEP.    Status On-going                 Plan - 02/05/20 1018    Clinical Impression Statement Patient ambulated into the clinic today with cane and ankle brace support for foot drop. He was challenged at therapy today with higher balance  activities and specifically struggled with SLS activites when required to stand on his L side. He displayed L hip weakness when performing flexion motion such as walking up the stairs. Pt. was able to do all ther ex activites today without his brace attacahed to his shoe requiring him to actively activate his L leg muscles for foot clearance.    PT Treatment/Interventions ADLs/Self Care Home Management;Electrical Stimulation;Moist Heat;Neuromuscular re-education;Balance training;Therapeutic exercise;Therapeutic activities;Functional mobility training;Stair training;Gait training;Patient/family education;Manual techniques;Passive range of motion;Dry needling;Taping    PT Home Exercise Plan higher level balance and hip stregnthening.           Patient will benefit from skilled therapeutic intervention in order to improve the following deficits and impairments:  Abnormal gait, Decreased balance, Decreased strength, Decreased mobility, Difficulty walking, Impaired sensation  Visit Diagnosis: Muscle weakness (generalized)  Foot drop, left  Sciatica, left side  Difficulty in walking, not elsewhere classified     Problem List Patient Active Problem List   Diagnosis Date Noted  . Aortic atherosclerosis ([place]) 09/28/2019  . Plantar fasciitis of left foot 09/21/2018  . Gastroesophageal reflux disease without esophagitis 09/21/2018  . Other abnormal glucose 02/13/2015  . BMI 23.0-23.9, adult 02/13/2015  . 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 Malignant neoplasm of prostate ([place]) 04/19/2014  . Hyperlipidemia 07/13/2013  . Vitamin D deficiency 07/13/2013  . Medication management 07/13/2013  . Essential hypertension 02/15/2007    Lavenia Atlas, SPTA 02/05/2020, 10:29 AM  [address]. [place], [place], [address] Phone: [phone]   Fax:  [phone]  Name: Matthew Aguirre MRN: [id number] Date of Birth:  19-Oct-1933

## 2020-02-06 ENCOUNTER — Encounter: Payer: Self-pay | Admitting: Physical Therapy

## 2020-02-06 ENCOUNTER — Ambulatory Visit: Payer: Medicare HMO | Admitting: Physical Therapy

## 2020-02-06 DIAGNOSIS — M5432 Sciatica, left side: Secondary | ICD-10-CM

## 2020-02-06 DIAGNOSIS — M6281 Muscle weakness (generalized): Secondary | ICD-10-CM

## 2020-02-06 DIAGNOSIS — M21372 Foot drop, left foot: Secondary | ICD-10-CM

## 2020-02-06 DIAGNOSIS — R262 Difficulty in walking, not elsewhere classified: Secondary | ICD-10-CM

## 2020-02-06 NOTE — Therapy (Signed)
[place]. [place], [place], [address] Phone: [phone]   Fax:  [phone] Progress Note Reporting Period 12/26/19 to 02/06/20 for the first 10 visits  See note below for Objective Data and Assessment of Progress/Goals.      Physical Therapy Treatment  Patient Details  Name: Matthew Aguirre MRN: [id number] Date of Birth: [birth year] Referring Provider (PT): Vicie Mutters, [place]    Encounter Date: 02/06/2020   PT End of Session - 02/06/20 0930    Visit Number 10    Number of Visits 19    Date for PT Re-Evaluation 03/09/20    PT Start Time 0845    PT Stop Time 0930    PT Time Calculation (min) 45 min           Past Medical History:  Diagnosis Date  . Essential hypertension 02/15/2007  . Hyperlipidemia 07/13/2013  . Prediabetes 07/13/2013  . Prostate cancer ([place])   . Pulmonary nodule (2008 resolved on f/u)  02/15/2007  . Vitamin D deficiency 07/13/2013    Past Surgical History:  Procedure Laterality Date  . PROSTATE BIOPSY  2020  . PROSTATE BIOPSY  2014  . TONSILECTOMY/ADENOIDECTOMY WITH MYRINGOTOMY    . TREATMENT FISTULA ANAL      There were no vitals filed for this visit.   Subjective Assessment - 02/06/20 0850    Subjective Doing ok this morning just tired,    Currently in Pain? Yes    Pain Location Foot    Pain Orientation Left    Pain Descriptors / Indicators Pins and needles                             [facility] Adult PT Treatment/Exercise - 02/06/20 0001      High Level Balance   High Level Balance Comments Standing marches on airex       Knee/Hip Exercises: Aerobic   Recumbent Bike L1 x 3 min     Nustep LE only L2 x5 min       Knee/Hip Exercises: Machines for Strengthening   Cybex Knee Extension 5# 2x10, LLE x10 no weight  very eak could not get full TKE    Cybex Knee Flexion 20# 2x10; LLE 5lb x10       Knee/Hip Exercises: Standing   Other Standing Knee  Exercises Resisted gait 10lb 4 way x4 each       Knee/Hip Exercises: Seated   Sit to Sand 3 sets;5 reps;without UE support   LE on airex                   PT Short Term Goals - 01/25/20 1726      PT SHORT TERM GOAL #1   Title Patient will be able to perform 5 degrees active L ankle dorsiflexion.    Baseline Unable to perform active dorsiflexion    Time 2    Period Weeks    Status On-going    Target Date 01/09/20      PT SHORT TERM GOAL #2   Title Pt will be able to walk around outside for 15 minutes to feed birds without falls or loss of balance using least restrictive adaptive device.    Baseline Pt currently uses single point cane, still unsteady    Time 2    Period Weeks    Status On-going    Target Date 01/09/20  PT SHORT TERM GOAL #3   Title Patient will increase bilateral hip flexion MMT to 4+/5 to allow for better clearance of each LE during gait.    Baseline 4/5 MMT hip flexion    Time 2    Period Weeks    Status On-going    Target Date 01/09/20             PT Long Term Goals - 01/31/20 1156      PT LONG TERM GOAL #1   Title Patient will improve FOTO score from 55% to 69% (from 45% limited to 31% limited).    Status On-going      PT LONG TERM GOAL #2   Title Patient will be able to actively dorsiflex L ankle to at least 10 degrees to allow for functional gait pattern.    Status On-going      PT LONG TERM GOAL #3   Title Pt will be independent with HEP.    Status On-going                 Plan - 02/06/20 0930    Clinical Impression Statement Ankle brace not connected today during session. Some instability noted today's with resisted gait. Initial instability noted with standing marches on airex but improved as reps progressed. He reports some anterior L tingling and tightness with LE extensions.    Personal Factors and Comorbidities Age;Comorbidity 1;Comorbidity 2    Comorbidities Prostate cancer (recently completed radiation  treatment), hypertension, prediabetes    Examination-Activity Limitations Sit;Stairs;Locomotion Level    Examination-Participation Restrictions Other    Stability/Clinical Decision Making Evolving/Moderate complexity    Rehab Potential Good    PT Frequency 2x / week    PT Duration 4 weeks    PT Treatment/Interventions ADLs/Self Care Home Management;Electrical Stimulation;Moist Heat;Neuromuscular re-education;Balance training;Therapeutic exercise;Therapeutic activities;Functional mobility training;Stair training;Gait training;Patient/family education;Manual techniques;Passive range of motion;Dry needling;Taping    PT Next Visit Plan work on strength, flexibility and balance           Patient will benefit from skilled therapeutic intervention in order to improve the following deficits and impairments:  Abnormal gait, Decreased balance, Decreased strength, Decreased mobility, Difficulty walking, Impaired sensation  Visit Diagnosis: Foot drop, left  Sciatica, left side  Difficulty in walking, not elsewhere classified  Muscle weakness (generalized)     Problem List Patient Active Problem List   Diagnosis Date Noted  . Aortic atherosclerosis ([place]) 09/28/2019  . Plantar fasciitis of left foot 09/21/2018  . Gastroesophageal reflux disease without esophagitis 09/21/2018  . Other abnormal glucose 02/13/2015  . BMI 23.0-23.9, adult 02/13/2015  . 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 Malignant neoplasm of prostate ([place]) 04/19/2014  . Hyperlipidemia 07/13/2013  . Vitamin D deficiency 07/13/2013  . Medication management 07/13/2013  . Essential hypertension 02/15/2007    Scot Jun, PTA 02/06/2020, 9:34 AM  [place]. [place], [place], [address] Phone: [phone]   Fax:  [phone]  Name: Matthew Aguirre MRN: [id number] Date of Birth: [birth year]

## 2020-02-13 ENCOUNTER — Ambulatory Visit: Payer: Medicare HMO | Admitting: Physical Therapy

## 2020-02-13 ENCOUNTER — Other Ambulatory Visit: Payer: Self-pay

## 2020-02-13 ENCOUNTER — Encounter: Payer: Self-pay | Admitting: Physical Therapy

## 2020-02-13 DIAGNOSIS — M6281 Muscle weakness (generalized): Secondary | ICD-10-CM | POA: Diagnosis not present

## 2020-02-13 DIAGNOSIS — M5432 Sciatica, left side: Secondary | ICD-10-CM | POA: Diagnosis not present

## 2020-02-13 DIAGNOSIS — M21372 Foot drop, left foot: Secondary | ICD-10-CM

## 2020-02-13 DIAGNOSIS — R262 Difficulty in walking, not elsewhere classified: Secondary | ICD-10-CM

## 2020-02-13 NOTE — Therapy (Signed)
[place]. [place], [place], [address] Phone: [phone]   Fax:  [phone]  Physical Therapy Treatment  Patient Details  Name: Matthew Aguirre MRN: [id number] Date of Birth: 1933-03-21 Referring Provider (PT): Vicie Mutters, [place]    Encounter Date: 02/13/2020   PT End of Session - 02/13/20 0928    Visit Number 11    Date for PT Re-Evaluation 03/09/20    Authorization Type [employer] Medicare    PT Start Time 0845    PT Stop Time 0929    PT Time Calculation (min) 44 min    Activity Tolerance Patient tolerated treatment well    Behavior During Therapy [facility] for tasks assessed/performed           Past Medical History:  Diagnosis Date  . Essential hypertension 02/15/2007  . Hyperlipidemia 07/13/2013  . Prediabetes 07/13/2013  . Prostate cancer ([place])   . Pulmonary nodule (2008 resolved on f/u)  02/15/2007  . Vitamin D deficiency 07/13/2013    Past Surgical History:  Procedure Laterality Date  . PROSTATE BIOPSY  2020  . PROSTATE BIOPSY  2014  . TONSILECTOMY/ADENOIDECTOMY WITH MYRINGOTOMY    . TREATMENT FISTULA ANAL      There were no vitals filed for this visit.   Subjective Assessment - 02/13/20 0846    Subjective A little stiff this morning, No pain    Currently in Pain? No/denies                             [facility] Adult PT Treatment/Exercise - 02/13/20 0001      Ambulation/Gait   Stairs Yes    Stairs Assistance 5: Supervision;4: Min guard    Stair Management Technique No rails   some slight rail use   Number of Stairs 9    Height of Stairs 4      High Level Balance   High Level Balance Comments stepping over half foam rolls       Knee/Hip Exercises: Aerobic   Recumbent Bike L1 x 3 min     Nustep LE only L2 x5 min       Knee/Hip Exercises: Machines for Strengthening   Cybex Knee Extension 5# 2x10, LLE x10 no weight  very eak could not get full TKE    Cybex Knee Flexion  20# 2x10; LLE 5lb x10       Knee/Hip Exercises: Standing   Other Standing Knee Exercises Resisted gait 10lb 4 way x3 each                     PT Short Term Goals - 01/25/20 1726      PT SHORT TERM GOAL #1   Title Patient will be able to perform 5 degrees active L ankle dorsiflexion.    Baseline Unable to perform active dorsiflexion    Time 2    Period Weeks    Status On-going    Target Date 01/09/20      PT SHORT TERM GOAL #2   Title Pt will be able to walk around outside for 15 minutes to feed birds without falls or loss of balance using least restrictive adaptive device.    Baseline Pt currently uses single point cane, still unsteady    Time 2    Period Weeks    Status On-going    Target Date 01/09/20      PT SHORT TERM GOAL #  3   Title Patient will increase bilateral hip flexion MMT to 4+/5 to allow for better clearance of each LE during gait.    Baseline 4/5 MMT hip flexion    Time 2    Period Weeks    Status On-going    Target Date 01/09/20             PT Long Term Goals - 01/31/20 1156      PT LONG TERM GOAL #1   Title Patient will improve FOTO score from 55% to 69% (from 45% limited to 31% limited).    Status On-going      PT LONG TERM GOAL #2   Title Patient will be able to actively dorsiflex L ankle to at least 10 degrees to allow for functional gait pattern.    Status On-going      PT LONG TERM GOAL #3   Title Pt will be independent with HEP.    Status On-going                 Plan - 02/13/20 0930    Clinical Impression Statement Again brace not connected during session, instability remains with resisted side steps. Some LLE weakness noted with extensions, some compensation notes with step ups using LLE.Posterior LLE pull reported with gastroc stretching.    Personal Factors and Comorbidities Age;Comorbidity 1;Comorbidity 2    Comorbidities Prostate cancer (recently completed radiation treatment), hypertension, prediabetes     Examination-Activity Limitations Sit;Stairs;Locomotion Level    Stability/Clinical Decision Making Evolving/Moderate complexity    Rehab Potential Good    PT Frequency 2x / week    PT Duration 4 weeks    PT Treatment/Interventions ADLs/Self Care Home Management;Electrical Stimulation;Moist Heat;Neuromuscular re-education;Balance training;Therapeutic exercise;Therapeutic activities;Functional mobility training;Stair training;Gait training;Patient/family education;Manual techniques;Passive range of motion;Dry needling;Taping    PT Next Visit Plan work on strength, flexibility and balance           Patient will benefit from skilled therapeutic intervention in order to improve the following deficits and impairments:  Abnormal gait, Decreased balance, Decreased strength, Decreased mobility, Difficulty walking, Impaired sensation  Visit Diagnosis: Foot drop, left  Sciatica, left side  Difficulty in walking, not elsewhere classified  Muscle weakness (generalized)     Problem List Patient Active Problem List   Diagnosis Date Noted  . Aortic atherosclerosis ([place]) 09/28/2019  . Plantar fasciitis of left foot 09/21/2018  . Gastroesophageal reflux disease without esophagitis 09/21/2018  . Other abnormal glucose 02/13/2015  . BMI 23.0-23.9, adult 02/13/2015  . 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 Malignant neoplasm of prostate ([place]) 04/19/2014  . Hyperlipidemia 07/13/2013  . Vitamin D deficiency 07/13/2013  . Medication management 07/13/2013  . Essential hypertension 02/15/2007    Scot Jun, PTA 02/13/2020, 9:35 AM  [place]. [place], [place], [address] Phone: [phone]   Fax:  [phone]  Name: Matthew Aguirre MRN: [id number] Date of Birth: 03-27-1933

## 2020-02-16 ENCOUNTER — Encounter: Payer: Self-pay | Admitting: Physical Therapy

## 2020-02-16 ENCOUNTER — Other Ambulatory Visit: Payer: Self-pay

## 2020-02-16 ENCOUNTER — Ambulatory Visit: Payer: Medicare HMO | Attending: Physician Assistant | Admitting: Physical Therapy

## 2020-02-16 DIAGNOSIS — M21372 Foot drop, left foot: Secondary | ICD-10-CM

## 2020-02-16 DIAGNOSIS — M5432 Sciatica, left side: Secondary | ICD-10-CM

## 2020-02-16 DIAGNOSIS — M6281 Muscle weakness (generalized): Secondary | ICD-10-CM | POA: Diagnosis not present

## 2020-02-16 DIAGNOSIS — R262 Difficulty in walking, not elsewhere classified: Secondary | ICD-10-CM | POA: Diagnosis not present

## 2020-02-16 NOTE — Therapy (Signed)
[place]. [place], [place], [address] Phone: [phone]   Fax:  [phone]  Physical Therapy Treatment  Patient Details  Name: Matthew Aguirre MRN: [id number] Date of Birth: [birth year] Referring Provider (PT): Vicie Mutters, [place]    Encounter Date: 02/16/2020   PT End of Session - 02/16/20 0927    Visit Number 12    Date for PT Re-Evaluation 03/09/20    PT Start Time 0845    PT Stop Time 0930    PT Time Calculation (min) 45 min    Activity Tolerance Patient tolerated treatment well    Behavior During Therapy [facility] for tasks assessed/performed           Past Medical History:  Diagnosis Date  . Essential hypertension 02/15/2007  . Hyperlipidemia 07/13/2013  . Prediabetes 07/13/2013  . Prostate cancer ([place])   . Pulmonary nodule (2008 resolved on f/u)  02/15/2007  . Vitamin D deficiency 07/13/2013    Past Surgical History:  Procedure Laterality Date  . PROSTATE BIOPSY  2020  . PROSTATE BIOPSY  2014  . TONSILECTOMY/ADENOIDECTOMY WITH MYRINGOTOMY    . TREATMENT FISTULA ANAL      There were no vitals filed for this visit.   Subjective Assessment - 02/16/20 0847    Subjective Tuesday was turning while chair walking, started doing side ways and fell to the floor, reports that's it could be due to his medication. No injuries    Currently in Pain? Yes    Pain Score 2     Pain Location Hip    Pain Orientation Left    Pain Descriptors / Indicators Aching                             [facility] Adult PT Treatment/Exercise - 02/16/20 0001      High Level Balance   High Level Balance Activities Backward walking    High Level Balance Comments stepping over half foam rolls       Knee/Hip Exercises: Stretches   Gastroc Stretch Left;4 reps;10 seconds    Other Knee/Hip Stretches Ankle PF & DF with DF assist.       Knee/Hip Exercises: Aerobic   Recumbent Bike L1 x 3 min     Nustep LE only L3  x5 min       Knee/Hip Exercises: Machines for Strengthening   Cybex Knee Extension 5# 2x10, LLE 2x10 no weight  very weak could not get full TKE    Cybex Knee Flexion 20# 2x10; LLE 5lb x10       Knee/Hip Exercises: Seated   Sit to Sand 2 sets;10 reps;Other (comment);without UE support   LE on airex     Manual Therapy   Passive ROM L ankle DF with end rang holds                    PT Short Term Goals - 01/25/20 1726      PT SHORT TERM GOAL #1   Title Patient will be able to perform 5 degrees active L ankle dorsiflexion.    Baseline Unable to perform active dorsiflexion    Time 2    Period Weeks    Status On-going    Target Date 01/09/20      PT SHORT TERM GOAL #2   Title Pt will be able to walk around outside for 15 minutes to feed birds without falls or  loss of balance using least restrictive adaptive device.    Baseline Pt currently uses single point cane, still unsteady    Time 2    Period Weeks    Status On-going    Target Date 01/09/20      PT SHORT TERM GOAL #3   Title Patient will increase bilateral hip flexion MMT to 4+/5 to allow for better clearance of each LE during gait.    Baseline 4/5 MMT hip flexion    Time 2    Period Weeks    Status On-going    Target Date 01/09/20             PT Long Term Goals - 02/16/20 0853      PT LONG TERM GOAL #2   Title Patient will be able to actively dorsiflex L ankle to at least 10 degrees to allow for functional gait pattern.    Status On-going      PT LONG TERM GOAL #3   Title Pt will be independent with HEP.    Status On-going                 Plan - 02/16/20 3159    Clinical Impression Statement No brace connected today, pt still has no active L ankle dorsiflexion. LLE is very weak when isolated on machines. CGA needed for all balance activities due to instability,    Comorbidities Prostate cancer (recently completed radiation treatment), hypertension, prediabetes    Examination-Activity  Limitations Sit;Stairs;Locomotion Level    Examination-Participation Restrictions Other    Stability/Clinical Decision Making Evolving/Moderate complexity    Rehab Potential Good    PT Frequency 2x / week    PT Duration 4 weeks    PT Treatment/Interventions ADLs/Self Care Home Management;Electrical Stimulation;Moist Heat;Neuromuscular re-education;Balance training;Therapeutic exercise;Therapeutic activities;Functional mobility training;Stair training;Gait training;Patient/family education;Manual techniques;Passive range of motion;Dry needling;Taping    PT Next Visit Plan work on strength, flexibility and balance           Patient will benefit from skilled therapeutic intervention in order to improve the following deficits and impairments:  Abnormal gait, Decreased balance, Decreased strength, Decreased mobility, Difficulty walking, Impaired sensation  Visit Diagnosis: Difficulty in walking, not elsewhere classified  Sciatica, left side  Foot drop, left     Problem List Patient Active Problem List   Diagnosis Date Noted  . Aortic atherosclerosis ([place]) 09/28/2019  . Plantar fasciitis of left foot 09/21/2018  . Gastroesophageal reflux disease without esophagitis 09/21/2018  . Other abnormal glucose 02/13/2015  . BMI 23.0-23.9, adult 02/13/2015  . 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 Malignant neoplasm of prostate ([place]) 04/19/2014  . Hyperlipidemia 07/13/2013  . Vitamin D deficiency 07/13/2013  . Medication management 07/13/2013  . Essential hypertension 02/15/2007    Matthew Aguirre, PTA 02/16/2020, 9:30 AM  [place]. [place], [place], [address] Phone: [phone]   Fax:  [phone]  Name: Matthew Aguirre MRN: [id number] Date of Birth: 01/04/1934

## 2020-02-19 ENCOUNTER — Other Ambulatory Visit: Payer: Self-pay | Admitting: Internal Medicine

## 2020-02-19 DIAGNOSIS — M5432 Sciatica, left side: Secondary | ICD-10-CM

## 2020-02-20 ENCOUNTER — Other Ambulatory Visit: Payer: Self-pay

## 2020-02-20 ENCOUNTER — Encounter: Payer: Self-pay | Admitting: Physical Therapy

## 2020-02-20 ENCOUNTER — Ambulatory Visit: Payer: Medicare HMO | Admitting: Physical Therapy

## 2020-02-20 DIAGNOSIS — R262 Difficulty in walking, not elsewhere classified: Secondary | ICD-10-CM

## 2020-02-20 DIAGNOSIS — M6281 Muscle weakness (generalized): Secondary | ICD-10-CM

## 2020-02-20 DIAGNOSIS — M5432 Sciatica, left side: Secondary | ICD-10-CM | POA: Diagnosis not present

## 2020-02-20 DIAGNOSIS — M21372 Foot drop, left foot: Secondary | ICD-10-CM

## 2020-02-20 NOTE — Therapy (Signed)
[place]. [place], [place], [address] Phone: [phone]   Fax:  [phone]  Physical Therapy Treatment  Patient Details  Name: Matthew Aguirre MRN: [id number] Date of Birth: May 11, 1933 Referring Provider (PT): Vicie Mutters, [place]    Encounter Date: 02/20/2020   PT End of Session - 02/20/20 0927    Visit Number 13    Date for PT Re-Evaluation 03/09/20    Authorization Type [employer] Medicare    PT Start Time 0845    PT Stop Time 0927    PT Time Calculation (min) 42 min    Activity Tolerance Patient tolerated treatment well    Behavior During Therapy [facility] for tasks assessed/performed           Past Medical History:  Diagnosis Date  . Essential hypertension 02/15/2007  . Hyperlipidemia 07/13/2013  . Prediabetes 07/13/2013  . Prostate cancer ([place])   . Pulmonary nodule (2008 resolved on f/u)  02/15/2007  . Vitamin D deficiency 07/13/2013    Past Surgical History:  Procedure Laterality Date  . PROSTATE BIOPSY  2020  . PROSTATE BIOPSY  2014  . TONSILECTOMY/ADENOIDECTOMY WITH MYRINGOTOMY    . TREATMENT FISTULA ANAL      There were no vitals filed for this visit.   Subjective Assessment - 02/20/20 0844    Subjective Sunday walked up stairs in church and came down the same way "Normal walking" More nerve tingling and shooting pain in leg and foot lately    Currently in Pain? Yes    Pain Score 2     Pain Location Foot    Pain Orientation Left                             [facility] Adult PT Treatment/Exercise - 02/20/20 0001      High Level Balance   High Level Balance Activities Backward walking    High Level Balance Comments stepping over half foam rolls       Knee/Hip Exercises: Aerobic   Recumbent Bike L1.5 x 3 min     Nustep LE only L3 x5 min       Knee/Hip Exercises: Machines for Strengthening   Cybex Knee Extension 5# 3x5, LLE 2x10 no weight  very weak could not get full TKE     Cybex Knee Flexion 25# 3x8; LLE 5lb x10     Cybex Leg Press 20lb 2x10, LLE no weight x10        Knee/Hip Exercises: Standing   Forward Step Up Both;1 set;Hand Hold: 1;10 reps;Step Height: 4"    Other Standing Knee Exercises Standing marches on airex x20                     PT Short Term Goals - 01/25/20 1726      PT SHORT TERM GOAL #1   Title Patient will be able to perform 5 degrees active L ankle dorsiflexion.    Baseline Unable to perform active dorsiflexion    Time 2    Period Weeks    Status On-going    Target Date 01/09/20      PT SHORT TERM GOAL #2   Title Pt will be able to walk around outside for 15 minutes to feed birds without falls or loss of balance using least restrictive adaptive device.    Baseline Pt currently uses single point cane, still unsteady    Time 2  Period Weeks    Status On-going    Target Date 01/09/20      PT SHORT TERM GOAL #3   Title Patient will increase bilateral hip flexion MMT to 4+/5 to allow for better clearance of each LE during gait.    Baseline 4/5 MMT hip flexion    Time 2    Period Weeks    Status On-going    Target Date 01/09/20             PT Long Term Goals - 02/16/20 0853      PT LONG TERM GOAL #2   Title Patient will be able to actively dorsiflex L ankle to at least 10 degrees to allow for functional gait pattern.    Status On-going      PT LONG TERM GOAL #3   Title Pt will be independent with HEP.    Status On-going                 Plan - 02/20/20 2951    Clinical Impression Statement Pt did well completing the interventions. He has some initial LOB with side step over foam roll requiring mod assist to regain balance. Increase weight tolerated with seated curls and extensions. He did better today maintain good stability with backwards walking.    Personal Factors and Comorbidities Age;Comorbidity 1;Comorbidity 2    Comorbidities Prostate cancer (recently completed radiation treatment),  hypertension, prediabetes    Examination-Activity Limitations Sit;Stairs;Locomotion Level    Stability/Clinical Decision Making Evolving/Moderate complexity    Rehab Potential Good    PT Frequency 2x / week    PT Treatment/Interventions ADLs/Self Care Home Management;Electrical Stimulation;Moist Heat;Neuromuscular re-education;Balance training;Therapeutic exercise;Therapeutic activities;Functional mobility training;Stair training;Gait training;Patient/family education;Manual techniques;Passive range of motion;Dry needling;Taping    PT Next Visit Plan work on strength, flexibility and balance           Patient will benefit from skilled therapeutic intervention in order to improve the following deficits and impairments:  Abnormal gait, Decreased balance, Decreased strength, Decreased mobility, Difficulty walking, Impaired sensation  Visit Diagnosis: Sciatica, left side  Foot drop, left  Difficulty in walking, not elsewhere classified  Muscle weakness (generalized)     Problem List Patient Active Problem List   Diagnosis Date Noted  . Aortic atherosclerosis ([place]) 09/28/2019  . Plantar fasciitis of left foot 09/21/2018  . Gastroesophageal reflux disease without esophagitis 09/21/2018  . Other abnormal glucose 02/13/2015  . BMI 23.0-23.9, adult 02/13/2015  . 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 Malignant neoplasm of prostate ([place]) 04/19/2014  . Hyperlipidemia 07/13/2013  . Vitamin D deficiency 07/13/2013  . Medication management 07/13/2013  . Essential hypertension 02/15/2007    Scot Jun 02/20/2020, 9:30 AM  [place]. [place], [place], [address] Phone: [phone]   Fax:  [phone]  Name: Matthew Aguirre MRN: [id number] Date of Birth: 05/09/1933

## 2020-02-21 ENCOUNTER — Other Ambulatory Visit: Payer: Self-pay

## 2020-02-21 DIAGNOSIS — Z1211 Encounter for screening for malignant neoplasm of colon: Secondary | ICD-10-CM

## 2020-02-21 LAB — POC HEMOCCULT BLD/STL (HOME/3-CARD/SCREEN)
Card #2 Fecal Occult Blod, POC: NEGATIVE
Card #3 Fecal Occult Blood, POC: NEGATIVE
Fecal Occult Blood, POC: NEGATIVE

## 2020-02-22 DIAGNOSIS — Z1211 Encounter for screening for malignant neoplasm of colon: Secondary | ICD-10-CM | POA: Diagnosis not present

## 2020-02-22 DIAGNOSIS — Z1212 Encounter for screening for malignant neoplasm of rectum: Secondary | ICD-10-CM | POA: Diagnosis not present

## 2020-02-23 ENCOUNTER — Ambulatory Visit: Payer: Medicare HMO | Admitting: Physical Therapy

## 2020-02-23 ENCOUNTER — Other Ambulatory Visit: Payer: Self-pay

## 2020-02-23 ENCOUNTER — Encounter: Payer: Self-pay | Admitting: Physical Therapy

## 2020-02-23 DIAGNOSIS — M21372 Foot drop, left foot: Secondary | ICD-10-CM | POA: Diagnosis not present

## 2020-02-23 DIAGNOSIS — M5432 Sciatica, left side: Secondary | ICD-10-CM

## 2020-02-23 DIAGNOSIS — M6281 Muscle weakness (generalized): Secondary | ICD-10-CM

## 2020-02-23 DIAGNOSIS — R262 Difficulty in walking, not elsewhere classified: Secondary | ICD-10-CM

## 2020-02-23 NOTE — Therapy (Signed)
[place]. [place], [place], [address] Phone: [phone]   Fax:  [phone]  Physical Therapy Treatment  Patient Details  Name: Matthew Aguirre MRN: [id number] Date of Birth: 03-14-1934 Referring Provider (PT): Vicie Mutters, [place]    Encounter Date: 02/23/2020   PT End of Session - 02/23/20 0935    Visit Number 14    Date for PT Re-Evaluation 03/09/20    Authorization Type [employer] Medicare    PT Start Time 0845    PT Stop Time 0928    PT Time Calculation (min) 43 min    Activity Tolerance Patient tolerated treatment well    Behavior During Therapy [facility] for tasks assessed/performed           Past Medical History:  Diagnosis Date  . Essential hypertension 02/15/2007  . Hyperlipidemia 07/13/2013  . Prediabetes 07/13/2013  . Prostate cancer ([place])   . Pulmonary nodule (2008 resolved on f/u)  02/15/2007  . Vitamin D deficiency 07/13/2013    Past Surgical History:  Procedure Laterality Date  . PROSTATE BIOPSY  2020  . PROSTATE BIOPSY  2014  . TONSILECTOMY/ADENOIDECTOMY WITH MYRINGOTOMY    . TREATMENT FISTULA ANAL      There were no vitals filed for this visit.   Subjective Assessment - 02/23/20 0846    Subjective "Ankle is stiff"    Currently in Pain? No/denies    Pain Location Ankle    Pain Descriptors / Indicators Tingling;Numbness                             [facility] Adult PT Treatment/Exercise - 02/23/20 0001      High Level Balance   High Level Balance Comments LLE SLS with UE support 3x10''      Knee/Hip Exercises: Aerobic   Recumbent Bike L1.7 x 3 min      Knee/Hip Exercises: Machines for Strengthening   Cybex Knee Extension LLE 3lb ankle weigh 2x10    Cybex Knee Flexion LLE 10lb 2x5    Cybex Leg Press LLE 20lb 3x5      Knee/Hip Exercises: Standing   Heel Raises Both;2 sets;10 reps;2 seconds    Forward Step Up Both;Step Height: 6";5 reps;2 sets   3 reps for second  set   Walking with Sports Cord 20lb 4 way x 3 each                    PT Short Term Goals - 01/25/20 1726      PT SHORT TERM GOAL #1   Title Patient will be able to perform 5 degrees active L ankle dorsiflexion.    Baseline Unable to perform active dorsiflexion    Time 2    Period Weeks    Status On-going    Target Date 01/09/20      PT SHORT TERM GOAL #2   Title Pt will be able to walk around outside for 15 minutes to feed birds without falls or loss of balance using least restrictive adaptive device.    Baseline Pt currently uses single point cane, still unsteady    Time 2    Period Weeks    Status On-going    Target Date 01/09/20      PT SHORT TERM GOAL #3   Title Patient will increase bilateral hip flexion MMT to 4+/5 to allow for better clearance of each LE during gait.  Baseline 4/5 MMT hip flexion    Time 2    Period Weeks    Status On-going    Target Date 01/09/20             PT Long Term Goals - 02/16/20 0853      PT LONG TERM GOAL #2   Title Patient will be able to actively dorsiflex L ankle to at least 10 degrees to allow for functional gait pattern.    Status On-going      PT LONG TERM GOAL #3   Title Pt will be independent with HEP.    Status On-going                 Plan - 02/23/20 0935    Clinical Impression Statement Pt able to complete all of today's interventions. He had good stability with resisted gait, decrease step length was noted with resisted backwards walking. LLE weakness present with step ups, pt unable to complete full TKE when stepping up with RLE. Some assist given at times with all LLE SL strengthening interventions.    Personal Factors and Comorbidities Age;Comorbidity 1;Comorbidity 2    Comorbidities Prostate cancer (recently completed radiation treatment), hypertension, prediabetes    Examination-Activity Limitations Sit;Stairs;Locomotion Level    Examination-Participation Restrictions Other     Stability/Clinical Decision Making Evolving/Moderate complexity    Rehab Potential Good    PT Frequency 2x / week    PT Duration 4 weeks    PT Treatment/Interventions ADLs/Self Care Home Management;Electrical Stimulation;Moist Heat;Neuromuscular re-education;Balance training;Therapeutic exercise;Therapeutic activities;Functional mobility training;Stair training;Gait training;Patient/family education;Manual techniques;Passive range of motion;Dry needling;Taping    PT Next Visit Plan work on strength, flexibility and balance           Patient will benefit from skilled therapeutic intervention in order to improve the following deficits and impairments:  Abnormal gait,Decreased balance,Decreased strength,Decreased mobility,Difficulty walking,Impaired sensation  Visit Diagnosis: Foot drop, left  Sciatica, left side  Muscle weakness (generalized)  Difficulty in walking, not elsewhere classified     Problem List Patient Active Problem List   Diagnosis Date Noted  . Aortic atherosclerosis ([place]) 09/28/2019  . Plantar fasciitis of left foot 09/21/2018  . Gastroesophageal reflux disease without esophagitis 09/21/2018  . Other abnormal glucose 02/13/2015  . BMI 23.0-23.9, adult 02/13/2015  . 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 Malignant neoplasm of prostate ([place]) 04/19/2014  . Hyperlipidemia 07/13/2013  . Vitamin D deficiency 07/13/2013  . Medication management 07/13/2013  . Essential hypertension 02/15/2007    Scot Jun, PTA 02/23/2020, 9:37 AM  [address]. [place], [place], [address] Phone: [phone]   Fax:  [phone]  Name: Matthew Aguirre MRN: [id number] Date of Birth: 13-Sep-1933

## 2020-02-26 ENCOUNTER — Other Ambulatory Visit: Payer: Self-pay

## 2020-02-26 ENCOUNTER — Encounter: Payer: Self-pay | Admitting: Physical Therapy

## 2020-02-26 ENCOUNTER — Ambulatory Visit: Payer: Medicare HMO | Admitting: Physical Therapy

## 2020-02-26 DIAGNOSIS — M6281 Muscle weakness (generalized): Secondary | ICD-10-CM

## 2020-02-26 DIAGNOSIS — R262 Difficulty in walking, not elsewhere classified: Secondary | ICD-10-CM | POA: Diagnosis not present

## 2020-02-26 DIAGNOSIS — M5432 Sciatica, left side: Secondary | ICD-10-CM

## 2020-02-26 DIAGNOSIS — M21372 Foot drop, left foot: Secondary | ICD-10-CM | POA: Diagnosis not present

## 2020-02-26 NOTE — Therapy (Signed)
[place]. [place], [place], [address] Phone: [phone]   Fax:  [phone]  Physical Therapy Treatment  Patient Details  Name: Matthew Aguirre MRN: [id number] Date of Birth: Dec 29, 1933 Referring Provider (PT): Vicie Mutters, [place]    Encounter Date: 02/26/2020   PT End of Session - 02/26/20 1225    Visit Number 15    Date for PT Re-Evaluation 03/09/20    Authorization Type [employer] Medicare    PT Start Time 4970    PT Stop Time 1229    PT Time Calculation (min) 44 min    Activity Tolerance Patient tolerated treatment well    Behavior During Therapy [facility] for tasks assessed/performed           Past Medical History:  Diagnosis Date   Essential hypertension 02/15/2007   Hyperlipidemia 07/13/2013   Prediabetes 07/13/2013   Prostate cancer ([place])    Pulmonary nodule (2008 resolved on f/u)  02/15/2007   Vitamin D deficiency 07/13/2013    Past Surgical History:  Procedure Laterality Date   PROSTATE BIOPSY  2020   PROSTATE BIOPSY  2014   TONSILECTOMY/ADENOIDECTOMY WITH MYRINGOTOMY     TREATMENT FISTULA ANAL      There were no vitals filed for this visit.   Subjective Assessment - 02/26/20 1146    Subjective achy in the R L hip today, not hurting now but was earlier.    Currently in Pain? No/denies                             [facility] Adult PT Treatment/Exercise - 02/26/20 0001      Knee/Hip Exercises: Aerobic   Recumbent Bike L1.7 x 3 min    Nustep LE only L3 x5 min       Knee/Hip Exercises: Machines for Strengthening   Cybex Knee Extension LLE 3lb ankle weight 2x15      Knee/Hip Exercises: Standing   Hip Flexion Stengthening;Both;2 sets;10 reps;Knee bent    Hip Abduction Both;2 sets;10 reps;Knee straight    Abduction Limitations 2.5lb    Hip Extension Stengthening;Both;2 sets;Knee straight;10 reps    Extension Limitations 2.5    Forward Step Up Both;1 set;10 reps;Hand  Hold: 2;Step Height: 6";Step Height: 4"   LLE 4in step     Knee/Hip Exercises: Seated   Hamstring Curl Left;2 sets;15 reps    Hamstring Limitations Green Tband    Sit to [employer] 2 sets;10 reps;Other (comment);without UE support                    PT Short Term Goals - 01/25/20 1726      PT SHORT TERM GOAL #1   Title Patient will be able to perform 5 degrees active L ankle dorsiflexion.    Baseline Unable to perform active dorsiflexion    Time 2    Period Weeks    Status On-going    Target Date 01/09/20      PT SHORT TERM GOAL #2   Title Pt will be able to walk around outside for 15 minutes to feed birds without falls or loss of balance using least restrictive adaptive device.    Baseline Pt currently uses single point cane, still unsteady    Time 2    Period Weeks    Status On-going    Target Date 01/09/20      PT SHORT TERM GOAL #3   Title Patient  will increase bilateral hip flexion MMT to 4+/5 to allow for better clearance of each LE during gait.    Baseline 4/5 MMT hip flexion    Time 2    Period Weeks    Status On-going    Target Date 01/09/20             PT Long Term Goals - 02/16/20 0853      PT LONG TERM GOAL #2   Title Patient will be able to actively dorsiflex L ankle to at least 10 degrees to allow for functional gait pattern.    Status On-going      PT LONG TERM GOAL #3   Title Pt will be independent with HEP.    Status On-going                 Plan - 02/26/20 1235    Clinical Impression Statement Pt enters clinic reporting L hip discomfort, NuStep and recumbent bike warm up did help. Smaller 4 in box was selected with step ups on L side due to weakness and compensation with 6 inch. L hip weakness was present with hp extensions. Cues to keep feet even with sit to stands, pt would compensate by bringing RLE further back when standing.    Personal Factors and Comorbidities Age;Comorbidity 1;Comorbidity 2    Comorbidities Prostate cancer  (recently completed radiation treatment), hypertension, prediabetes    Examination-Activity Limitations Sit;Stairs;Locomotion Level    Examination-Participation Restrictions Other    Stability/Clinical Decision Making Evolving/Moderate complexity    Rehab Potential Good    PT Frequency 2x / week    PT Duration 4 weeks    PT Treatment/Interventions ADLs/Self Care Home Management;Electrical Stimulation;Moist Heat;Neuromuscular re-education;Balance training;Therapeutic exercise;Therapeutic activities;Functional mobility training;Stair training;Gait training;Patient/family education;Manual techniques;Passive range of motion;Dry needling;Taping    PT Next Visit Plan work on strength, flexibility and balance           Patient will benefit from skilled therapeutic intervention in order to improve the following deficits and impairments:  Abnormal gait,Decreased balance,Decreased strength,Decreased mobility,Difficulty walking,Impaired sensation  Visit Diagnosis: Sciatica, left side  Muscle weakness (generalized)     Problem List Patient Active Problem List   Diagnosis Date Noted   Aortic atherosclerosis ([place]) 09/28/2019   Plantar fasciitis of left foot 09/21/2018   Gastroesophageal reflux disease without esophagitis 09/21/2018   Other abnormal glucose 02/13/2015   BMI 23.0-23.9, adult 02/13/2015   Pulmonary sarcoidosis (1988)  07/29/2014   Malignant neoplasm of prostate ([place]) 04/19/2014   Hyperlipidemia 07/13/2013   Vitamin D deficiency 07/13/2013   Medication management 07/13/2013   Essential hypertension 02/15/2007    Scot Jun 02/26/2020, 12:44 PM  [place]. [place], [place], [address] Phone: [phone]   Fax:  [phone]  Name: Matthew Aguirre MRN: [id number] Date of Birth: Jul 12, 1933

## 2020-02-27 NOTE — Progress Notes (Signed)
  Radiation Oncology         [phone] ________________________________  Name: Matthew Aguirre MRN: [id number]  Date: 09/05/2019  DOB: 04/08/1933  End of Treatment Note  Diagnosis:   86 y.o. 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     Indication for treatment:  Curative, Definitive Radiotherapy       Radiation treatment dates:   07/11/19-09/05/19  Site/dose:  1. The prostate, seminal vesicles, and pelvic lymph nodes were initially treated to 45 Gy in 25 fractions of 1.8 Gy  2. The prostate only was boosted to 75 Gy with 15 additional fractions of 2.0 Gy   Beams/energy:  1. The prostate, seminal vesicles, and pelvic lymph nodes were initially treated using VMAT intensity modulated radiotherapy delivering 6 megavolt photons. Image guidance was performed with CB-CT studies prior to each fraction. He was immobilized with a body fix lower extremity mold.  2. the prostate only was boosted using VMAT intensity modulated radiotherapy delivering 6 megavolt photons. Image guidance was performed with CB-CT studies prior to each fraction. He was immobilized with a body fix lower extremity mold.  Narrative: The patient tolerated radiation treatment relatively well.   The patient experienced some minor urinary irritation and modest fatigue.    Plan: The patient has completed radiation treatment. He will return to radiation oncology clinic for routine followup in one month. I advised him to call or return sooner if he has any questions or concerns related to his recovery or treatment. ________________________________  Sheral Apley. Tammi Klippel, M.D.

## 2020-03-01 ENCOUNTER — Other Ambulatory Visit: Payer: Self-pay

## 2020-03-01 ENCOUNTER — Encounter: Payer: Self-pay | Admitting: Physical Therapy

## 2020-03-01 ENCOUNTER — Ambulatory Visit: Payer: Medicare HMO | Admitting: Physical Therapy

## 2020-03-01 DIAGNOSIS — M6281 Muscle weakness (generalized): Secondary | ICD-10-CM | POA: Diagnosis not present

## 2020-03-01 DIAGNOSIS — M5432 Sciatica, left side: Secondary | ICD-10-CM | POA: Diagnosis not present

## 2020-03-01 DIAGNOSIS — M21372 Foot drop, left foot: Secondary | ICD-10-CM

## 2020-03-01 DIAGNOSIS — R262 Difficulty in walking, not elsewhere classified: Secondary | ICD-10-CM | POA: Diagnosis not present

## 2020-03-01 NOTE — Therapy (Signed)
[place]. [place], [place], [address] Phone: [phone]   Fax:  [phone]  Physical Therapy Treatment  Patient Details  Name: Matthew Aguirre MRN: [id number] Date of Birth: 1933-07-03 Referring Provider (PT): Vicie Mutters, [place]    Encounter Date: 03/01/2020   PT End of Session - 03/01/20 0929    Visit Number 16    Date for PT Re-Evaluation 03/09/20    Authorization Type [employer]etna Medicare    PT Start Time 0845    PT Stop Time 0928    PT Time Calculation (min) 43 min    Activity Tolerance Patient tolerated treatment well    Behavior During Therapy [facility] for tasks assessed/performed           Past Medical History:  Diagnosis Date  . Essential hypertension 02/15/2007  . Hyperlipidemia 07/13/2013  . Prediabetes 07/13/2013  . Prostate cancer ([place])   . Pulmonary nodule (2008 resolved on f/u)  02/15/2007  . Vitamin D deficiency 07/13/2013    Past Surgical History:  Procedure Laterality Date  . PROSTATE BIOPSY  2020  . PROSTATE BIOPSY  2014  . TONSILECTOMY/ADENOIDECTOMY WITH MYRINGOTOMY    . TREATMENT FISTULA ANAL      There were no vitals filed for this visit.   Subjective Assessment - 03/01/20 0850    Subjective "Better today, got my booster yesterday"    Currently in Pain? No/denies                             [facility] Adult PT Treatment/Exercise - 03/01/20 0001      High Level Balance   High Level Balance Comments standing on airex with abll toss      Knee/Hip Exercises: Aerobic   Recumbent Bike L1 x 4 min    Nustep LE only L4 x5 min      Knee/Hip Exercises: Machines for Strengthening   Cybex Knee Extension 5# 2x10, LLE 2x10 3lb    Cybex Knee Flexion 25# 2x10; LLE 5lb x10    Cybex Leg Press 40lb 2x10, LLE 20 x10      Knee/Hip Exercises: Standing   Lateral Step Up Both;2 sets;10 reps;Hand Hold: 1;Step Height: 4"                    PT Short Term Goals -  01/25/20 1726      PT SHORT TERM GOAL #1   Title Patient will be able to perform 5 degrees active L ankle dorsiflexion.    Baseline Unable to perform active dorsiflexion    Time 2    Period Weeks    Status On-going    Target Date 01/09/20      PT SHORT TERM GOAL #2   Title Pt will be able to walk around outside for 15 minutes to feed birds without falls or loss of balance using least restrictive adaptive device.    Baseline Pt currently uses single point cane, still unsteady    Time 2    Period Weeks    Status On-going    Target Date 01/09/20      PT SHORT TERM GOAL #3   Title Patient will increase bilateral hip flexion MMT to 4+/5 to allow for better clearance of each LE during gait.    Baseline 4/5 MMT hip flexion    Time 2    Period Weeks    Status On-going    Target  Date 01/09/20             PT Long Term Goals - 02/16/20 0853      PT LONG TERM GOAL #2   Title Patient will be able to actively dorsiflex L ankle to at least 10 degrees to allow for functional gait pattern.    Status On-going      PT LONG TERM GOAL #3   Title Pt will be independent with HEP.    Status On-going                 Plan - 03/01/20 0932    Clinical Impression Statement Focused today's session on bilateral LE strengthening as well as some LLE isolation. LLE is much weaker than R, some assist was given with SL interventions. Some mild hesitation with lateral step ups today, but able to complete without LOB. Pt does tend to lean to his R side on airex paid, taking weight off his L.    Personal Factors and Comorbidities Age;Comorbidity 1;Comorbidity 2    Comorbidities Prostate cancer (recently completed radiation treatment), hypertension, prediabetes    Examination-Activity Limitations Sit;Stairs;Locomotion Level    Examination-Participation Restrictions Other    Stability/Clinical Decision Making Evolving/Moderate complexity    Rehab Potential Good    PT Frequency 2x / week    PT  Treatment/Interventions ADLs/Self Care Home Management;Electrical Stimulation;Moist Heat;Neuromuscular re-education;Balance training;Therapeutic exercise;Therapeutic activities;Functional mobility training;Stair training;Gait training;Patient/family education;Manual techniques;Passive range of motion;Dry needling;Taping    PT Next Visit Plan work on strength, flexibility and balance           Patient will benefit from skilled therapeutic intervention in order to improve the following deficits and impairments:  Abnormal gait,Decreased balance,Decreased strength,Decreased mobility,Difficulty walking,Impaired sensation  Visit Diagnosis: Muscle weakness (generalized)  Foot drop, left  Sciatica, left side     Problem List Patient Active Problem List   Diagnosis Date Noted  . Aortic atherosclerosis ([place]) 09/28/2019  . Plantar fasciitis of left foot 09/21/2018  . Gastroesophageal reflux disease without esophagitis 09/21/2018  . Other abnormal glucose 02/13/2015  . BMI 23.0-23.9, adult 02/13/2015  . 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 Malignant neoplasm of prostate ([place]) 04/19/2014  . Hyperlipidemia 07/13/2013  . Vitamin D deficiency 07/13/2013  . Medication management 07/13/2013  . Essential hypertension 02/15/2007    Scot Jun, PTA 03/01/2020, 9:36 AM  [address]. [place], [place], [address] Phone: [phone]   Fax:  [phone]  Name: Matthew Aguirre MRN: [id number] Date of Birth: [birth year]

## 2020-03-04 ENCOUNTER — Encounter: Payer: Self-pay | Admitting: Physical Therapy

## 2020-03-04 ENCOUNTER — Ambulatory Visit: Payer: Medicare HMO | Admitting: Physical Therapy

## 2020-03-04 ENCOUNTER — Other Ambulatory Visit: Payer: Self-pay

## 2020-03-04 DIAGNOSIS — M5432 Sciatica, left side: Secondary | ICD-10-CM

## 2020-03-04 DIAGNOSIS — M6281 Muscle weakness (generalized): Secondary | ICD-10-CM | POA: Diagnosis not present

## 2020-03-04 DIAGNOSIS — R262 Difficulty in walking, not elsewhere classified: Secondary | ICD-10-CM | POA: Diagnosis not present

## 2020-03-04 DIAGNOSIS — M21372 Foot drop, left foot: Secondary | ICD-10-CM

## 2020-03-04 NOTE — Therapy (Signed)
[place]. [place], [place], [address] Phone: [phone]   Fax:  [phone]  Physical Therapy Treatment  Patient Details  Name: Matthew Aguirre MRN: [id number] Date of Birth: [birth year] Referring Provider (PT): Vicie Mutters, [place]    Encounter Date: 03/04/2020   PT End of Session - 03/04/20 0927    Visit Number 17    Date for PT Re-Evaluation 03/09/20    PT Start Time 0845    PT Stop Time 0927    PT Time Calculation (min) 42 min    Activity Tolerance Patient tolerated treatment well    Behavior During Therapy [facility] for tasks assessed/performed           Past Medical History:  Diagnosis Date  . Essential hypertension 02/15/2007  . Hyperlipidemia 07/13/2013  . Prediabetes 07/13/2013  . Prostate cancer ([place])   . Pulmonary nodule (2008 resolved on f/u)  02/15/2007  . Vitamin D deficiency 07/13/2013    Past Surgical History:  Procedure Laterality Date  . PROSTATE BIOPSY  2020  . PROSTATE BIOPSY  2014  . TONSILECTOMY/ADENOIDECTOMY WITH MYRINGOTOMY    . TREATMENT FISTULA ANAL      There were no vitals filed for this visit.   Subjective Assessment - 03/04/20 0849    Subjective a little wobbly today    Currently in Pain? Yes    Pain Location Hip    Pain Orientation Left;Right    Pain Descriptors / Indicators Aching                             [facility] Adult PT Treatment/Exercise - 03/04/20 0001      Knee/Hip Exercises: Stretches   Gastroc Stretch Left;3 reps;10 seconds      Knee/Hip Exercises: Aerobic   Recumbent Bike 1.7 x67min      Knee/Hip Exercises: Machines for Strengthening   Cybex Knee Flexion LLE 5lb x10      Knee/Hip Exercises: Standing   Forward Step Up Both;2 sets;5 reps;Hand Hold: 2;Step Height: 6"   on airex to 6'' box   Other Standing Knee Exercises Shoulder Ext 5lb on airex    Other Standing Knee Exercises stading rows 5lb 2x10      Knee/Hip Exercises: Seated    Sit to Sand 2 sets;10 reps;without UE support   on airex OHP,  with yellow ball                   PT Short Term Goals - 01/25/20 1726      PT SHORT TERM GOAL #1   Title Patient will be able to perform 5 degrees active L ankle dorsiflexion.    Baseline Unable to perform active dorsiflexion    Time 2    Period Weeks    Status On-going    Target Date 01/09/20      PT SHORT TERM GOAL #2   Title Pt will be able to walk around outside for 15 minutes to feed birds without falls or loss of balance using least restrictive adaptive device.    Baseline Pt currently uses single point cane, still unsteady    Time 2    Period Weeks    Status On-going    Target Date 01/09/20      PT SHORT TERM GOAL #3   Title Patient will increase bilateral hip flexion MMT to 4+/5 to allow for better clearance of each LE during gait.  Baseline 4/5 MMT hip flexion    Time 2    Period Weeks    Status On-going    Target Date 01/09/20             PT Long Term Goals - 02/16/20 0853      PT LONG TERM GOAL #2   Title Patient will be able to actively dorsiflex L ankle to at least 10 degrees to allow for functional gait pattern.    Status On-going      PT LONG TERM GOAL #3   Title Pt will be independent with HEP.    Status On-going                 Plan - 03/04/20 1448    Clinical Impression Statement Pt did fair today in today's session. Interventions focused on postural strength on non compliant surfaces. He did well with sit to stand on airex. Light resistance with shoulder ext tolerated while standing on airex. He was unable to maintain stability with standing rows requiring the removal of airex pad.  Some RLE clearance issues with step ups. Added calf stretch to HEP.    Comorbidities Prostate cancer (recently completed radiation treatment), hypertension, prediabetes    Examination-Activity Limitations Sit;Stairs;Locomotion Level    Stability/Clinical Decision Making  Evolving/Moderate complexity    Rehab Potential Good    PT Frequency 2x / week    PT Duration 4 weeks    PT Treatment/Interventions ADLs/Self Care Home Management;Electrical Stimulation;Moist Heat;Neuromuscular re-education;Balance training;Therapeutic exercise;Therapeutic activities;Functional mobility training;Stair training;Gait training;Patient/family education;Manual techniques;Passive range of motion;Dry needling;Taping    PT Next Visit Plan work on strength, flexibility and balance           Patient will benefit from skilled therapeutic intervention in order to improve the following deficits and impairments:  Abnormal gait,Decreased balance,Decreased strength,Decreased mobility,Difficulty walking,Impaired sensation  Visit Diagnosis: Sciatica, left side  Foot drop, left     Problem List Patient Active Problem List   Diagnosis Date Noted  . Aortic atherosclerosis ([place]) 09/28/2019  . Plantar fasciitis of left foot 09/21/2018  . Gastroesophageal reflux disease without esophagitis 09/21/2018  . Other abnormal glucose 02/13/2015  . BMI 23.0-23.9, adult 02/13/2015  . 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 Malignant neoplasm of prostate ([place]) 04/19/2014  . Hyperlipidemia 07/13/2013  . Vitamin D deficiency 07/13/2013  . Medication management 07/13/2013  . Essential hypertension 02/15/2007    Scot Jun, PTA 03/04/2020, 9:38 AM  [place]. [place], [place], [address] Phone: [phone]   Fax:  [phone]  Name: Matthew Aguirre MRN: [id number] Date of Birth: 1933-06-26

## 2020-03-05 DIAGNOSIS — R69 Illness, unspecified: Secondary | ICD-10-CM | POA: Diagnosis not present

## 2020-03-06 ENCOUNTER — Ambulatory Visit: Payer: Medicare HMO | Admitting: Physical Therapy

## 2020-03-06 ENCOUNTER — Encounter: Payer: Self-pay | Admitting: Physical Therapy

## 2020-03-06 ENCOUNTER — Other Ambulatory Visit: Payer: Self-pay

## 2020-03-06 DIAGNOSIS — M6281 Muscle weakness (generalized): Secondary | ICD-10-CM

## 2020-03-06 DIAGNOSIS — M21372 Foot drop, left foot: Secondary | ICD-10-CM

## 2020-03-06 DIAGNOSIS — M5432 Sciatica, left side: Secondary | ICD-10-CM

## 2020-03-06 DIAGNOSIS — R262 Difficulty in walking, not elsewhere classified: Secondary | ICD-10-CM

## 2020-03-06 NOTE — Therapy (Signed)
[place]. [place], [place], [address] Phone: [phone]   Fax:  [phone]  Physical Therapy Treatment  Patient Details  Name: Matthew Aguirre MRN: [id number] Date of Birth: [birth year] Referring Provider (PT): Vicie Mutters, [place]    Encounter Date: 03/06/2020   PT End of Session - 03/06/20 0926    Visit Number 18    Date for PT Re-Evaluation 03/09/20    Authorization Type [employer] Medicare    PT Start Time 0845    PT Stop Time 0929    PT Time Calculation (min) 44 min    Activity Tolerance Patient tolerated treatment well    Behavior During Therapy [facility] for tasks assessed/performed           Past Medical History:  Diagnosis Date  . Essential hypertension 02/15/2007  . Hyperlipidemia 07/13/2013  . Prediabetes 07/13/2013  . Prostate cancer ([place])   . Pulmonary nodule (2008 resolved on f/u)  02/15/2007  . Vitamin D deficiency 07/13/2013    Past Surgical History:  Procedure Laterality Date  . PROSTATE BIOPSY  2020  . PROSTATE BIOPSY  2014  . TONSILECTOMY/ADENOIDECTOMY WITH MYRINGOTOMY    . TREATMENT FISTULA ANAL      There were no vitals filed for this visit.   Subjective Assessment - 03/06/20 0845    Subjective "Got up tired but feeling pretty good right now"    Currently in Pain? No/denies                             [facility] Adult PT Treatment/Exercise - 03/06/20 0001      High Level Balance   High Level Balance Activities Backward walking    High Level Balance Comments side step over half foam roll      Knee/Hip Exercises: Stretches   Gastroc Stretch Left;3 reps;10 seconds      Knee/Hip Exercises: Aerobic   Nustep LE only L4 x6 min      Knee/Hip Exercises: Machines for Strengthening   Cybex Knee Flexion 25# x10; LLE 5lb x10      Knee/Hip Exercises: Standing   Lateral Step Up Both;2 sets;Hand Hold: 1;Step Height: 6";5 reps   from airex to box   Other Standing Knee  Exercises standing march on airex 2x10    Other Standing Knee Exercises stading rows on aires red tband 2x10      Knee/Hip Exercises: Seated   Long Arc Quad Strengthening;Left;20 reps    Long Arc Quad Weight 5 lbs.    Other Seated Knee/Hip Exercises Ankle PF/DF assit needed with DF not acrtive movement with DF                    PT Short Term Goals - 01/25/20 1726      PT SHORT TERM GOAL #1   Title Patient will be able to perform 5 degrees active L ankle dorsiflexion.    Baseline Unable to perform active dorsiflexion    Time 2    Period Weeks    Status On-going    Target Date 01/09/20      PT SHORT TERM GOAL #2   Title Pt will be able to walk around outside for 15 minutes to feed birds without falls or loss of balance using least restrictive adaptive device.    Baseline Pt currently uses single point cane, still unsteady    Time 2    Period Weeks  Status On-going    Target Date 01/09/20      PT SHORT TERM GOAL #3   Title Patient will increase bilateral hip flexion MMT to 4+/5 to allow for better clearance of each LE during gait.    Baseline 4/5 MMT hip flexion    Time 2    Period Weeks    Status On-going    Target Date 01/09/20             PT Long Term Goals - 02/16/20 0853      PT LONG TERM GOAL #2   Title Patient will be able to actively dorsiflex L ankle to at least 10 degrees to allow for functional gait pattern.    Status On-going      PT LONG TERM GOAL #3   Title Pt will be independent with HEP.    Status On-going                 Plan - 03/06/20 5465    Clinical Impression Statement Pt continues to have no active L ankle DF. Despite lack of active DF he is becoming more stable on his feet. No issues today with backwards walking, in the past backwards walking would cause some instability. Pt is also lacking great toe extension in L foot.  increase resistance tolerated with LAQ. He was able to tolerate standing row on aires against tband  resistance.    Personal Factors and Comorbidities Age;Comorbidity 1;Comorbidity 2    Comorbidities Prostate cancer (recently completed radiation treatment), hypertension, prediabetes    Examination-Activity Limitations Sit;Stairs;Locomotion Level    Examination-Participation Restrictions Other    Stability/Clinical Decision Making Evolving/Moderate complexity    Rehab Potential Good    PT Frequency 2x / week    PT Duration 4 weeks    PT Treatment/Interventions ADLs/Self Care Home Management;Electrical Stimulation;Moist Heat;Neuromuscular re-education;Balance training;Therapeutic exercise;Therapeutic activities;Functional mobility training;Stair training;Gait training;Patient/family education;Manual techniques;Passive range of motion;Dry needling;Taping    PT Next Visit Plan work on strength, flexibility and balance           Patient will benefit from skilled therapeutic intervention in order to improve the following deficits and impairments:  Abnormal gait,Decreased balance,Decreased strength,Decreased mobility,Difficulty walking,Impaired sensation  Visit Diagnosis: Foot drop, left  Muscle weakness (generalized)  Difficulty in walking, not elsewhere classified  Sciatica, left side     Problem List Patient Active Problem List   Diagnosis Date Noted  . Aortic atherosclerosis (HCC) 09/28/2019  . Plantar fasciitis of left foot 09/21/2018  . Gastroesophageal reflux disease without esophagitis 09/21/2018  . Other abnormal glucose 02/13/2015  . BMI 23.0-23.9, adult 02/13/2015  . 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 Malignant neoplasm of prostate (HCC) 04/19/2014  . Hyperlipidemia 07/13/2013  . Vitamin D deficiency 07/13/2013  . Medication management 07/13/2013  . Essential hypertension 02/15/2007    Grayce Sessions, PTA 03/06/2020, 9:30 AM  [facility] [place] [address]. [place], [place], [address] Phone: [phone]    Fax:  [phone]  Name: Matthew Aguirre MRN: [id number] Date of Birth: Jun 24, 1933

## 2020-03-12 ENCOUNTER — Ambulatory Visit: Payer: Medicare HMO | Admitting: Physical Therapy

## 2020-03-12 ENCOUNTER — Encounter: Payer: Self-pay | Admitting: Physical Therapy

## 2020-03-12 ENCOUNTER — Other Ambulatory Visit: Payer: Self-pay

## 2020-03-12 DIAGNOSIS — M21372 Foot drop, left foot: Secondary | ICD-10-CM | POA: Diagnosis not present

## 2020-03-12 DIAGNOSIS — R262 Difficulty in walking, not elsewhere classified: Secondary | ICD-10-CM | POA: Diagnosis not present

## 2020-03-12 DIAGNOSIS — M5432 Sciatica, left side: Secondary | ICD-10-CM | POA: Diagnosis not present

## 2020-03-12 DIAGNOSIS — M6281 Muscle weakness (generalized): Secondary | ICD-10-CM

## 2020-03-12 NOTE — Therapy (Signed)
[facility] [place] [address] [place], [place], [address] Phone: [phone]   Fax:  [phone]  Physical Therapy Treatment  Patient Details  Name: Matthew Aguirre MRN: [id number] Date of Birth: 01/20/1934 Referring Provider (PT): Quentin Mulling, [place]    Encounter Date: 03/12/2020   PT End of Session - 03/12/20 0925    Visit Number 19    Date for PT Re-Evaluation 03/09/20    Authorization Type [employer]na Medicare    PT Start Time 0845    PT Stop Time 0927    PT Time Calculation (min) 42 min    Activity Tolerance Patient tolerated treatment well    Behavior During Therapy [facility] for tasks assessed/performed           Past Medical History:  Diagnosis Date  . Essential hypertension 02/15/2007  . Hyperlipidemia 07/13/2013  . Prediabetes 07/13/2013  . Prostate cancer (HCC)   . Pulmonary nodule (2008 resolved on f/u)  02/15/2007  . Vitamin D deficiency 07/13/2013    Past Surgical History:  Procedure Laterality Date  . PROSTATE BIOPSY  2020  . PROSTATE BIOPSY  2014  . TONSILECTOMY/ADENOIDECTOMY WITH MYRINGOTOMY    . TREATMENT FISTULA ANAL      There were no vitals filed for this visit.   Subjective Assessment - 03/12/20 0849    Subjective Normans numbness in the lateral L shin area, dull ache in the L hip    Currently in Pain? No/denies                             [facility] Adult PT Treatment/Exercise - 03/12/20 0001      Ambulation/Gait   Ambulation/Gait Yes    Ambulation/Gait Assistance 6: Modified independent (Device/Increase time)    Ambulation Distance (Feet) 750 Feet    Assistive device Straight cane    Gait Pattern Step-through pattern   LLE foot drop   Ambulation Surface Level;Unlevel;Paved    Gait Comments In parking lot      Knee/Hip Exercises: Aerobic   Nustep LE only L4 x6 min      Knee/Hip Exercises: Machines for Strengthening   Cybex Knee Extension LLE 5lb 3x5    Cybex Knee Flexion 25#  x15; LLE 5lb x10      Knee/Hip Exercises: Standing   Rocker Board Limitations 1.3 x3 min    Other Standing Knee Exercises standing march on airex 2x10    Other Standing Knee Exercises stading rows on aires red tband 2x15                    PT Short Term Goals - 03/12/20 9892      PT SHORT TERM GOAL #1   Title Patient will be able to perform 5 degrees active L ankle dorsiflexion.    Status On-going             PT Long Term Goals - 02/16/20 0853      PT LONG TERM GOAL #2   Title Patient will be able to actively dorsiflex L ankle to at least 10 degrees to allow for functional gait pattern.    Status On-going      PT LONG TERM GOAL #3   Title Pt will be independent with HEP.    Status On-going                 Plan - 03/12/20 0927    Clinical Impression Statement  Progressed to outdoor ambulation without functional issue only fatigue. He did have on able brace with foot drop support during session.  He was able to fully extend his LLE with machine level resistance for the first time. CGA needed for standing march on airex. He still has no active L ankle dorsiflexion.    Personal Factors and Comorbidities Age;Comorbidity 1;Comorbidity 2    Comorbidities Prostate cancer (recently completed radiation treatment), hypertension, prediabetes    Stability/Clinical Decision Making Evolving/Moderate complexity    Rehab Potential Good    PT Frequency 2x / week    PT Treatment/Interventions ADLs/Self Care Home Management;Electrical Stimulation;Moist Heat;Neuromuscular re-education;Balance training;Therapeutic exercise;Therapeutic activities;Functional mobility training;Stair training;Gait training;Patient/family education;Manual techniques;Passive range of motion;Dry needling;Taping    PT Next Visit Plan work on strength, flexibility and balance           Patient will benefit from skilled therapeutic intervention in order to improve the following deficits and impairments:   Abnormal gait,Decreased balance,Decreased strength,Decreased mobility,Difficulty walking,Impaired sensation  Visit Diagnosis: Muscle weakness (generalized)  Foot drop, left  Difficulty in walking, not elsewhere classified  Sciatica, left side     Problem List Patient Active Problem List   Diagnosis Date Noted  . Aortic atherosclerosis (HCC) 09/28/2019  . Plantar fasciitis of left foot 09/21/2018  . Gastroesophageal reflux disease without esophagitis 09/21/2018  . Other abnormal glucose 02/13/2015  . BMI 23.0-23.9, adult 02/13/2015  . 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 Malignant neoplasm of prostate (HCC) 04/19/2014  . Hyperlipidemia 07/13/2013  . Vitamin D deficiency 07/13/2013  . Medication management 07/13/2013  . Essential hypertension 02/15/2007    Grayce Sessions 03/12/2020, 9:35 AM  [facility] [place] [facility] [address]. [place], [place], [address] Phone: [phone]   Fax:  [phone]  Name: Matthew Aguirre MRN: [id number] Date of Birth: 1933-10-21

## 2020-03-14 ENCOUNTER — Other Ambulatory Visit: Payer: Self-pay

## 2020-03-14 ENCOUNTER — Ambulatory Visit: Payer: Medicare HMO | Admitting: Physical Therapy

## 2020-03-14 ENCOUNTER — Encounter: Payer: Self-pay | Admitting: Physical Therapy

## 2020-03-14 DIAGNOSIS — M6281 Muscle weakness (generalized): Secondary | ICD-10-CM

## 2020-03-14 DIAGNOSIS — M5432 Sciatica, left side: Secondary | ICD-10-CM

## 2020-03-14 DIAGNOSIS — M21372 Foot drop, left foot: Secondary | ICD-10-CM

## 2020-03-14 DIAGNOSIS — R262 Difficulty in walking, not elsewhere classified: Secondary | ICD-10-CM

## 2020-03-14 NOTE — Therapy (Signed)
[facility] [place] [address] [place], [place], [address] Phone: [phone]   Fax:  [phone] Progress Note Reporting Period 02/13/20 to 03/14/20 for visits 11-20  See note below for Objective Data and Assessment of Progress/Goals.       Physical Therapy Treatment  Patient Details  Name: Matthew Aguirre MRN: [id number] Date of Birth: [birth year] Referring Provider (PT): Quentin Mulling, [place]    Encounter Date: 03/14/2020   PT End of Session - 03/14/20 0923    Visit Number 20    Date for PT Re-Evaluation 04/12/20    Authorization Type Aetna Medicare    PT Start Time 0845    PT Stop Time 0930    PT Time Calculation (min) 45 min    Activity Tolerance Patient tolerated treatment well    Behavior During Therapy [facility] for tasks assessed/performed           Past Medical History:  Diagnosis Date  . Essential hypertension 02/15/2007  . Hyperlipidemia 07/13/2013  . Prediabetes 07/13/2013  . Prostate cancer (HCC)   . Pulmonary nodule (2008 resolved on f/u)  02/15/2007  . Vitamin D deficiency 07/13/2013    Past Surgical History:  Procedure Laterality Date  . PROSTATE BIOPSY  2020  . PROSTATE BIOPSY  2014  . TONSILECTOMY/ADENOIDECTOMY WITH MYRINGOTOMY    . TREATMENT FISTULA ANAL      There were no vitals filed for this visit.   Subjective Assessment - 03/14/20 0849    Subjective a little achy in L shin, think it is cause of the weather    Currently in Pain? No/denies                             [facility] Adult PT Treatment/Exercise - 03/14/20 0001      High Level Balance   High Level Balance Activities Backward walking    High Level Balance Comments side step over half foam roll      Knee/Hip Exercises: Aerobic   Recumbent Bike 1.7 x    Nustep LE only L3 x6 min      Knee/Hip Exercises: Machines for Strengthening   Cybex Knee Extension LLE 5lb 3x5      Knee/Hip Exercises: Standing   Other  Standing Knee Exercises Alt taps 3lb 6in SPC 2x10      Knee/Hip Exercises: Seated   Sit to Sand 2 sets;10 reps;without UE support   airex under LEs                   PT Short Term Goals - 03/12/20 1638      PT SHORT TERM GOAL #1   Title Patient will be able to perform 5 degrees active L ankle dorsiflexion.    Status On-going             PT Long Term Goals - 02/16/20 0853      PT LONG TERM GOAL #2   Title Patient will be able to actively dorsiflex L ankle to at least 10 degrees to allow for functional gait pattern.    Status On-going      PT LONG TERM GOAL #3   Title Pt will be independent with HEP.    Status On-going                 Plan - 03/14/20 4536    Clinical Impression Statement Pt did well overall. Alt taps with resistance was  taxing on PT, but he completed it well. He has improved with his function during backwards walking and side steps over objects. Good carryover from last session with single leg extensions. Spoke with lead PT will decrease to 1 a week, pt sees MD on jan 10 th.    Comorbidities Prostate cancer (recently completed radiation treatment), hypertension, prediabetes    Examination-Activity Limitations Sit;Stairs;Locomotion Level    Examination-Participation Restrictions Other    Stability/Clinical Decision Making Evolving/Moderate complexity    Rehab Potential Good    PT Frequency 2x / week    PT Duration 4 weeks    PT Treatment/Interventions ADLs/Self Care Home Management;Electrical Stimulation;Moist Heat;Neuromuscular re-education;Balance training;Therapeutic exercise;Therapeutic activities;Functional mobility training;Stair training;Gait training;Patient/family education;Manual techniques;Passive range of motion;Dry needling;Taping    PT Next Visit Plan work on strength, flexibility and balance           Patient will benefit from skilled therapeutic intervention in order to improve the following deficits and impairments:   Abnormal gait,Decreased balance,Decreased strength,Decreased mobility,Difficulty walking,Impaired sensation  Visit Diagnosis: Foot drop, left  Difficulty in walking, not elsewhere classified  Sciatica, left side  Muscle weakness (generalized)     Problem List Patient Active Problem List   Diagnosis Date Noted  . Aortic atherosclerosis ([place]) 09/28/2019  . Plantar fasciitis of left foot 09/21/2018  . Gastroesophageal reflux disease without esophagitis 09/21/2018  . Other abnormal glucose 02/13/2015  . BMI 23.0-23.9, adult 02/13/2015  . 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 Malignant neoplasm of prostate ([place]) 04/19/2014  . Hyperlipidemia 07/13/2013  . Vitamin D deficiency 07/13/2013  . Medication management 07/13/2013  . Essential hypertension 02/15/2007    Scot Jun, PTA 03/14/2020, 9:30 AM  [place]. [place], [place], [address] Phone: [phone]   Fax:  [phone]  Name: BERN MEHALIC MRN: [id number] Date of Birth: 03-03-1934

## 2020-03-19 ENCOUNTER — Ambulatory Visit: Payer: Medicare HMO | Admitting: Physical Therapy

## 2020-03-20 ENCOUNTER — Other Ambulatory Visit: Payer: Self-pay | Admitting: Internal Medicine

## 2020-03-20 DIAGNOSIS — K219 Gastro-esophageal reflux disease without esophagitis: Secondary | ICD-10-CM

## 2020-03-20 MED ORDER — PANTOPRAZOLE SODIUM 40 MG PO TBEC: DELAYED_RELEASE_TABLET | ORAL | 0 refills | Status: DC

## 2020-03-21 ENCOUNTER — Encounter: Payer: Self-pay | Admitting: Physical Therapy

## 2020-03-21 ENCOUNTER — Other Ambulatory Visit: Payer: Self-pay

## 2020-03-21 ENCOUNTER — Ambulatory Visit: Payer: Medicare HMO | Attending: Physician Assistant | Admitting: Physical Therapy

## 2020-03-21 DIAGNOSIS — M5432 Sciatica, left side: Secondary | ICD-10-CM | POA: Insufficient documentation

## 2020-03-21 DIAGNOSIS — M6281 Muscle weakness (generalized): Secondary | ICD-10-CM | POA: Insufficient documentation

## 2020-03-21 DIAGNOSIS — M21372 Foot drop, left foot: Secondary | ICD-10-CM | POA: Insufficient documentation

## 2020-03-21 DIAGNOSIS — R262 Difficulty in walking, not elsewhere classified: Secondary | ICD-10-CM | POA: Insufficient documentation

## 2020-03-21 NOTE — Therapy (Signed)
[facility] [place] [address] [place], [place], [address] Phone: [phone]   Fax:  [phone]  Physical Therapy Treatment  Patient Details  Name: Matthew Aguirre MRN: [id number] Date of Birth: April 28, 1933 Referring Provider (PT): Quentin Mulling, [place]    Encounter Date: 03/21/2020    Past Medical History:  Diagnosis Date  . Essential hypertension 02/15/2007  . Hyperlipidemia 07/13/2013  . Prediabetes 07/13/2013  . Prostate cancer (HCC)   . Pulmonary nodule (2008 resolved on f/u)  02/15/2007  . Vitamin D deficiency 07/13/2013    Past Surgical History:  Procedure Laterality Date  . PROSTATE BIOPSY  2020  . PROSTATE BIOPSY  2014  . TONSILECTOMY/ADENOIDECTOMY WITH MYRINGOTOMY    . TREATMENT FISTULA ANAL      There were no vitals filed for this visit.   Subjective Assessment - 03/21/20 1350    Subjective Doing pretty good no pain, goes to MD Monday    Currently in Pain? No/denies                             [facility] Adult PT Treatment/Exercise - 03/21/20 0001      Knee/Hip Exercises: Aerobic   Recumbent Bike 1.7 x 6 min      Knee/Hip Exercises: Machines for Strengthening   Cybex Knee Extension LLE 5lb 3x5    Cybex Knee Flexion 25# x15; LLE 5lb x10    Cybex Leg Press 40lb 2x10, LLE 20 2x5      Knee/Hip Exercises: Standing   Lateral Step Up Both;Hand Hold: 1;Step Height: 6";1 set;10 reps    Walking with Sports Cord 20lb 4 way x 4 each                    PT Short Term Goals - 03/12/20 1324      PT SHORT TERM GOAL #1   Title Patient will be able to perform 5 degrees active L ankle dorsiflexion.    Status On-going             PT Long Term Goals - 03/21/20 1411      PT LONG TERM GOAL #2   Title Patient will be able to actively dorsiflex L ankle to at least 10 degrees to allow for functional gait pattern.    Status On-going      PT LONG TERM GOAL #3   Title Pt will be independent  with HEP.    Status Achieved                 Plan - 03/21/20 1417    Clinical Impression Statement Pt functional stability has improved since starting therapy. interventions performed without foot drop brace unattached. He had good balance and stability with resisted side stepping, forward walking, and backwards walking. Some UE assist needed with lateral step ups with his LLE. L ankle has no active dorsiflexion and LLE remains weak isolated interventions Pt reports no falls at home and is able to go to the grocery store and carry items inside with several trips.    Personal Factors and Comorbidities Age;Comorbidity 1;Comorbidity 2    Comorbidities Prostate cancer (recently completed radiation treatment), hypertension, prediabetes    Examination-Activity Limitations Sit;Stairs;Locomotion Level    Stability/Clinical Decision Making Evolving/Moderate complexity    Rehab Potential Good    PT Frequency 2x / week    PT Duration 4 weeks    PT Treatment/Interventions ADLs/Self Care Home Management;Electrical  Stimulation;Moist Heat;Neuromuscular re-education;Balance training;Therapeutic exercise;Therapeutic activities;Functional mobility training;Stair training;Gait training;Patient/family education;Manual techniques;Passive range of motion;Dry needling;Taping    PT Next Visit Plan Get report from MD           Patient will benefit from skilled therapeutic intervention in order to improve the following deficits and impairments:  Abnormal gait,Decreased balance,Decreased strength,Decreased mobility,Difficulty walking,Impaired sensation  Visit Diagnosis: Sciatica, left side  Muscle weakness (generalized)  Difficulty in walking, not elsewhere classified  Foot drop, left     Problem List Patient Active Problem List   Diagnosis Date Noted  . Aortic atherosclerosis ([place]) 09/28/2019  . Plantar fasciitis of left foot 09/21/2018  . Gastroesophageal reflux disease without esophagitis  09/21/2018  . Other abnormal glucose 02/13/2015  . BMI 23.0-23.9, adult 02/13/2015  . 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 Malignant neoplasm of prostate ([place]) 04/19/2014  . Hyperlipidemia 07/13/2013  . Vitamin D deficiency 07/13/2013  . Medication management 07/13/2013  . Essential hypertension 02/15/2007    Scot Jun, PTA 03/21/2020, 2:25 PM  [place]. [place], [place], [address] Phone: [phone]   Fax:  [phone]  Name: EDMAN KAVA MRN: [id number] Date of Birth: 13-May-1933

## 2020-03-25 DIAGNOSIS — C61 Malignant neoplasm of prostate: Secondary | ICD-10-CM | POA: Diagnosis not present

## 2020-03-25 DIAGNOSIS — M21372 Foot drop, left foot: Secondary | ICD-10-CM | POA: Diagnosis not present

## 2020-03-25 DIAGNOSIS — R030 Elevated blood-pressure reading, without diagnosis of hypertension: Secondary | ICD-10-CM | POA: Diagnosis not present

## 2020-03-26 ENCOUNTER — Encounter: Payer: Self-pay | Admitting: Physical Therapy

## 2020-03-26 ENCOUNTER — Other Ambulatory Visit: Payer: Self-pay

## 2020-03-26 ENCOUNTER — Ambulatory Visit: Payer: Medicare HMO | Admitting: Physical Therapy

## 2020-03-26 DIAGNOSIS — M6281 Muscle weakness (generalized): Secondary | ICD-10-CM | POA: Diagnosis not present

## 2020-03-26 DIAGNOSIS — M21372 Foot drop, left foot: Secondary | ICD-10-CM

## 2020-03-26 DIAGNOSIS — R262 Difficulty in walking, not elsewhere classified: Secondary | ICD-10-CM | POA: Diagnosis not present

## 2020-03-26 DIAGNOSIS — M5432 Sciatica, left side: Secondary | ICD-10-CM | POA: Diagnosis not present

## 2020-03-26 NOTE — Patient Instructions (Signed)
Access Code: [email or web address] URL: [email or web address] Date: 03/26/2020 Prepared by: Cheri Fowler  Exercises Hamstring Curl with Weight Machine - 1 x daily - 7 x weekly - 3 sets - 10 reps Knee Extension with Weight Machine - 1 x daily - 7 x weekly - 3 sets - 10 reps Full Leg Press - 1 x daily - 7 x weekly - 3 sets - 10 reps

## 2020-03-26 NOTE — Therapy (Signed)
[place] [place], [place], [address] Phone: [phone]   Fax:  [phone]  Physical Therapy Treatment  Patient Details  Name: Matthew Aguirre MRN: [id number] Date of Birth: 1933-08-29 Referring Provider (PT): Vicie Mutters, [place]    Encounter Date: 03/26/2020   PT End of Session - 03/26/20 1459    Visit Number 21    Date for PT Re-Evaluation 04/12/20    Authorization Type [employer]    PT Start Time 1430    PT Stop Time 1459    PT Time Calculation (min) 29 min    Activity Tolerance Patient tolerated treatment well    Behavior During Therapy [facility] for tasks assessed/performed           Past Medical History:  Diagnosis Date  . Essential hypertension 02/15/2007  . Hyperlipidemia 07/13/2013  . Prediabetes 07/13/2013  . Prostate cancer ([place])   . Pulmonary nodule (2008 resolved on f/u)  02/15/2007  . Vitamin D deficiency 07/13/2013    Past Surgical History:  Procedure Laterality Date  . PROSTATE BIOPSY  2020  . PROSTATE BIOPSY  2014  . TONSILECTOMY/ADENOIDECTOMY WITH MYRINGOTOMY    . TREATMENT FISTULA ANAL      There were no vitals filed for this visit.   Subjective Assessment - 03/26/20 1437    Subjective Pt reports receiving a new medication and having hyper sensitive skin on LLE    Currently in Pain? No/denies                             [facility] Adult PT Treatment/Exercise - 03/26/20 0001      Knee/Hip Exercises: Aerobic   Nustep LE only L5 x6 min      Knee/Hip Exercises: Machines for Strengthening   Cybex Knee Extension 5lb 2x10    Cybex Knee Flexion 25# 2x0    Cybex Leg Press 40lb 2x10                    PT Short Term Goals - 03/12/20 0349      PT SHORT TERM GOAL #1   Title Patient will be able to perform 5 degrees active L ankle dorsiflexion.    Status On-going             PT Long Term Goals - 03/21/20 1411      PT LONG TERM GOAL #2   Title  Patient will be able to actively dorsiflex L ankle to at least 10 degrees to allow for functional gait pattern.    Status On-going      PT LONG TERM GOAL #3   Title Pt will be independent with HEP.    Status Achieved                 Plan - 03/26/20 1459    Clinical Impression Statement Pt returns to therapy after seeing MD. MD stated it was ok to D/C pt with HEP and that pt was ok to join [facility]. Pt reports confidence walking. Gave pt updated HEP that consisted of LE strengthening interventions.    Personal Factors and Comorbidities Age;Comorbidity 1;Comorbidity 2    Comorbidities Prostate cancer (recently completed radiation treatment), hypertension, prediabetes    Examination-Activity Limitations Sit;Stairs;Locomotion Level    Examination-Participation Restrictions Other    Stability/Clinical Decision Making Evolving/Moderate complexity    Rehab Potential Good    PT Frequency 2x / week  PT Duration 4 weeks    PT Treatment/Interventions ADLs/Self Care Home Management;Electrical Stimulation;Moist Heat;Neuromuscular re-education;Balance training;Therapeutic exercise;Therapeutic activities;Functional mobility training;Stair training;Gait training;Patient/family education;Manual techniques;Passive range of motion;Dry needling;Taping    PT Next Visit Plan D/C PT           Patient will benefit from skilled therapeutic intervention in order to improve the following deficits and impairments:  Abnormal gait,Decreased balance,Decreased strength,Decreased mobility,Difficulty walking,Impaired sensation  Visit Diagnosis: Difficulty in walking, not elsewhere classified  Foot drop, left  Muscle weakness (generalized)  Sciatica, left side     Problem List Patient Active Problem List   Diagnosis Date Noted  . Aortic atherosclerosis ([place]) 09/28/2019  . Plantar fasciitis of left foot 09/21/2018  . Gastroesophageal reflux disease without esophagitis 09/21/2018  . Other abnormal  glucose 02/13/2015  . BMI 23.0-23.9, adult 02/13/2015  . 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 Malignant neoplasm of prostate ([place]) 04/19/2014  . Hyperlipidemia 07/13/2013  . Vitamin D deficiency 07/13/2013  . Medication management 07/13/2013  . Essential hypertension 02/15/2007   PHYSICAL THERAPY DISCHARGE SUMMARY  Visits from Start of Care: 21   Plan: Patient agrees to discharge.  Patient goals were not met. Patient is being discharged due to the physician's request.  ?????      Scot Jun, PTA 03/26/2020, 3:04 PM  [address] [place], [place], [address] Phone: [phone]   Fax:  [phone]  Name: Matthew Aguirre MRN: [id number] Date of Birth: Apr 25, 1933

## 2020-03-27 DIAGNOSIS — H5213 Myopia, bilateral: Secondary | ICD-10-CM | POA: Diagnosis not present

## 2020-03-27 DIAGNOSIS — H524 Presbyopia: Secondary | ICD-10-CM | POA: Diagnosis not present

## 2020-03-27 DIAGNOSIS — Z961 Presence of intraocular lens: Secondary | ICD-10-CM | POA: Diagnosis not present

## 2020-03-27 DIAGNOSIS — H52203 Unspecified astigmatism, bilateral: Secondary | ICD-10-CM | POA: Diagnosis not present

## 2020-04-15 DIAGNOSIS — G541 Lumbosacral plexus disorders: Secondary | ICD-10-CM | POA: Diagnosis not present

## 2020-04-15 DIAGNOSIS — M5417 Radiculopathy, lumbosacral region: Secondary | ICD-10-CM | POA: Diagnosis not present

## 2020-04-15 DIAGNOSIS — G5732 Lesion of lateral popliteal nerve, left lower limb: Secondary | ICD-10-CM | POA: Diagnosis not present

## 2020-04-17 DIAGNOSIS — C61 Malignant neoplasm of prostate: Secondary | ICD-10-CM | POA: Diagnosis not present

## 2020-04-17 DIAGNOSIS — G541 Lumbosacral plexus disorders: Secondary | ICD-10-CM | POA: Diagnosis not present

## 2020-04-17 DIAGNOSIS — I10 Essential (primary) hypertension: Secondary | ICD-10-CM | POA: Diagnosis not present

## 2020-04-17 DIAGNOSIS — G5732 Lesion of lateral popliteal nerve, left lower limb: Secondary | ICD-10-CM | POA: Diagnosis not present

## 2020-04-24 DIAGNOSIS — C61 Malignant neoplasm of prostate: Secondary | ICD-10-CM | POA: Diagnosis not present

## 2020-04-30 ENCOUNTER — Other Ambulatory Visit: Payer: Self-pay

## 2020-04-30 DIAGNOSIS — M5432 Sciatica, left side: Secondary | ICD-10-CM

## 2020-04-30 MED ORDER — GABAPENTIN 100 MG PO CAPS: ORAL_CAPSULE | ORAL | 0 refills | Status: DC

## 2020-05-13 DIAGNOSIS — M21372 Foot drop, left foot: Secondary | ICD-10-CM | POA: Insufficient documentation

## 2020-05-13 NOTE — Progress Notes (Signed)
3 MONTH FOLLOW UP Assessment:    Aortic atherosclerosis ([place]) Per CT 03/2019 Control blood pressure, cholesterol, glucose, increase exercise.   Essential hypertension Continue medication, labile but mostly good control on home logs Some dizziness; suggested check BP prior to taking metoprolol, do 1/2 tab if <110/70 Monitor blood pressure at home; call if consistently over 140/80 Continue DASH diet.   Reminder to go to the ER if any CP, SOB, nausea, dizziness, severe HA, changes vision/speech, left arm numbness and tingling and jaw pain.  Pulmonary sarcoidosis (1988)  Followed by pulmonology PRN  Prostate cancer [facility])  Managed by Dr. Junious Silk, s/p radiation, ongoing eligard  Vitamin D deficiency At goal at last check; continue to recommend supplementation for goal of 60-100 Defer vitamin D level to CPE  Other abnormal glucose Recent A1Cs at goal Discussed diet/exercise, weight management  Defer A1C; check BMP  Medication management CBC, CMP/GFR  Hyperlipidemia No longer treated due to age Mild elevations monitored Continue cholesterol diet/exercise Check lipid panel   BMI 24 Continue to recommend diet heavy in fruits and veggies and low in animal meats, cheeses, and dairy products, appropriate calorie intake Discuss exercise recommendations routinely Continue to monitor weight at each visit  Sciatica/left foot drop Continue ankle brace, gabapentin PRN, home exercises   Over 30 minutes of exam, counseling, chart review, and critical decision making was performed  Future Appointments  Date Time Provider [place]  08/19/2020 10:30 AM Unk Pinto, MD [facility] None  10/14/2020 10:30 AM [name], NP [facility] None  02/12/2021 10:00 AM Unk Pinto, MD [facility] None      Subjective:  Matthew Aguirre is a 87 y.o. male who presents for 3 month follow up for HTN, hyperlipidemia, glucose management, and vitamin D Def.   Patient was having  cramping and aching of left lower extremity, reported a fall on Sept 27th with consequent Left foot drop and had a Lumbar MRI Sept 29th which showed mild lumbar scoliosis, multilevel degenerative changes, mild spinal stenosis and mild subarticular stenosis bil L3-4, mild to moderate at L4-5. Was referred to [facility], Dr. 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 Continues with home exercises. Also ankle brace device for foot drop. He is taking gabapentin 100 mg only at night. He reports doing well. Pain fully resolved.   Patient has hx/o Prostate Cancer (2014 ) followed on Finasteride, has been doing active surveillance per Dr Junious Silk, in 2020 PSA jumped up to 9.88, was referred to Dr. Tresa Moore and underwent MRI on 12/02/2018 which found gleason 8 cancer, underwent biopsy on 03/08/2019. PET scan 03/29/2019 showed no mets. He was initiated on Eligard in Feb 2021, had prostate markers placed 4/6 and underwent external radiation, 40 treatments. He reports mild night sweats and fatigue with initiation of eligard, does manage fairly.   BMI is Body mass index is 24.69 kg/m., he has been working on diet and exercise, walks/strength exercises daily.  Wt Readings from Last 3 Encounters:  05/15/20 153 lb (69.4 kg)  01/29/20 148 lb 3.2 oz (67.2 kg)  12/26/19 146 lb (66.2 kg)   Ct abd/pelvis in 03/2019 showed aortic atherosclerosis His blood pressure has been controlled at home (labile 100- rare 140s/60-70), today their BP is BP: (!) 144/74 He does workout. He denies chest pain, shortness of breath. Does endorse some afternoon fatigue and dizziness with abrupt position changes. Taking lopressor 50 mg BID.   He is  not on cholesterol medication secondary to age. Hx of intolerance of RYRS, welcol, zetia, pravastatin. Currently taking fish oil. His LDL cholesterol is at goal. The cholesterol last  visit was:   Lab Results  Component Value Date   CHOL 159 01/29/2020   HDL 36 (L) 01/29/2020   LDLCALC 90 01/29/2020   TRIG 254 (H) 01/29/2020   CHOLHDL 4.4 01/29/2020   He has been working on diet and exercise for glucose management, and denies foot ulcerations, increased appetite, nausea, paresthesia of the feet, polydipsia, polyuria, visual disturbances, vomiting and weight loss. Last A1C in the office was:  Lab Results  Component Value Date   HGBA1C 5.5 01/29/2020   Last GFR Lab Results  Component Value Date   GFRNONAA 79 01/29/2020    Patient is on Vitamin D supplement and at goal:    Lab Results  Component Value Date   VD25OH 83 01/29/2020        Medication Review:   Current Outpatient Medications (Cardiovascular):  .  metoprolol tartrate (LOPRESSOR) 50 MG tablet, Take 1 tablet (50 mg total) by mouth 2 (two) times daily.   Current Outpatient Medications (Analgesics):  .  acetaminophen (TYLENOL) 500 MG tablet, Take 500 mg by mouth as needed (prn).  Marland Kitchen  aspirin 81 MG EC tablet, Take by mouth.   Current Outpatient Medications (Other):  Marland Kitchen  Alpha-Lipoic Acid 300 MG TABS, Take 300 mg by mouth daily. .  B Complex Vitamins (VITAMIN B COMPLEX PO), Take 600 mg by mouth daily. .  Calcium Carbonate (CALCIUM 600 PO), Take by mouth daily. .  Cholecalciferol (VITAMIN D PO), Take 2,000 Int'l Units by mouth 2 (two) times daily.  .  Flaxseed, Linseed, (FLAX SEED OIL PO), Take 1,200 mg by mouth 3 (three) times daily.  Marland Kitchen  gabapentin (NEURONTIN) 100 MG capsule, TAKE 1 TO 3 CAPSULES BY MOUTH EVERY NIGHT AT BEDTIME FOR SCIATICA .  leuprolide, 6 Month, (ELIGARD) 45 MG injection, Inject 45 mg into the skin every 6 (six) months. .  Magnesium 250 MG TABS, Take 250 mg by mouth daily. .  Omega-3 Fatty Acids (FISH OIL PO), Take 1,200 mg by mouth 2 (two) times daily.  .  pantoprazole (PROTONIX) 40 MG tablet, Take     1 tablet     Daily     to Prevent Indigestion & Heartburn .  famotidine  (PEPCID) 20 MG tablet, Take 20 mg by mouth at bedtime. (Patient not taking: Reported on 05/15/2020)  Allergies: Allergies  Allergen Reactions  . Pravastatin   . Red Yeast Rice [Cholestin]   . Sulfa Antibiotics   . Vibramycin [Doxycycline Calcium]   . Zetia [Ezetimibe]     Current Problems (verified) has Essential hypertension; Hyperlipidemia; Vitamin D deficiency; Medication management; Malignant neoplasm of prostate ([place]); Pulmonary sarcoidosis (1988) ; Other abnormal glucose; BMI 23.0-23.9, adult; Plantar fasciitis of left foot; Gastroesophageal reflux disease without esophagitis; Aortic atherosclerosis ([place]); and Left foot drop on their problem list.   Surgical: He  has a past surgical history that includes Treatment fistula anal; Tonsilectomy/adenoidectomy with myringotomy; Prostate biopsy (2020); and Prostate biopsy (2014). Family His family history includes Hyperlipidemia in his brother; Hypertension in his brother. Social history  He reports that he has never smoked. He has never used smokeless tobacco. He reports that he does not drink alcohol and does not use drugs.   Review of Systems  Constitutional: Negative for malaise/fatigue (mid afternoon fatigue with eligard) and weight loss.  HENT: Negative for hearing loss  and tinnitus.   Eyes: Negative for blurred vision and double vision.  Respiratory: Negative for cough, shortness of breath and wheezing.   Cardiovascular: Negative for chest pain, palpitations, orthopnea, claudication and leg swelling.  Gastrointestinal: Negative for abdominal pain, blood in stool, constipation, diarrhea, heartburn, melena, nausea and vomiting.  Genitourinary: Negative.   Musculoskeletal: Negative for joint pain and myalgias.  Skin: Negative for rash.  Neurological: Positive for focal weakness (left foot drop, now in brace). Negative for dizziness, tingling, sensory change, weakness and headaches.  Endo/Heme/Allergies: Negative for polydipsia.   Psychiatric/Behavioral: Negative.   All other systems reviewed and are negative.    Objective:   Today's Vitals   05/15/20 1058  BP: (!) 144/74  Pulse: 80  Temp: (!) 97.2 F (36.2 C)  SpO2: 98%  Weight: 153 lb (69.4 kg)   Body mass index is 24.69 kg/m.  General appearance: alert, no distress, WD/WN, male HEENT: normocephalic, sclerae anicteric, TMs pearly, nares patent, no discharge or erythema, pharynx normal Oral cavity: MMM, no lesions Neck: supple, no lymphadenopathy, no thyromegaly, no masses Heart: RRR, normal S1, S2, no murmurs Lungs: CTA bilaterally, no wheezes, rhonchi, or rales Abdomen: +bs, soft, non tender, non distended, no masses, no hepatomegaly, no splenomegaly Musculoskeletal: nontender, no swelling, no obvious deformity. Neg straight leg raise. Mild kyphosis.  Extremities: no edema, no cyanosis, no clubbing Pulses: 2+ symmetric, upper and lower extremities, normal cap refill Neurological: alert, oriented x 3, CN2-12 intact, strength normal upper extremities and lower extremities excepting left foot flexion, sensation normal throughout, DTRs 2+ upper extremities and patellar, no cerebellar signs, mildlly antalgic gait with cane.  Psychiatric: normal affect, behavior normal, pleasant     Izora Ribas, NP   05/15/2020

## 2020-05-15 ENCOUNTER — Other Ambulatory Visit: Payer: Self-pay

## 2020-05-15 ENCOUNTER — Ambulatory Visit: Payer: Medicare HMO | Admitting: Adult Health

## 2020-05-15 ENCOUNTER — Encounter: Payer: Self-pay | Admitting: Adult Health

## 2020-05-15 ENCOUNTER — Ambulatory Visit (INDEPENDENT_AMBULATORY_CARE_PROVIDER_SITE_OTHER): Payer: Medicare HMO | Admitting: Adult Health

## 2020-05-15 VITALS — BP 144/74 | HR 80 | Temp 97.2°F | Wt 153.0 lb

## 2020-05-15 DIAGNOSIS — D860 Sarcoidosis of lung: Secondary | ICD-10-CM

## 2020-05-15 DIAGNOSIS — G541 Lumbosacral plexus disorders: Secondary | ICD-10-CM

## 2020-05-15 DIAGNOSIS — C61 Malignant neoplasm of prostate: Secondary | ICD-10-CM

## 2020-05-15 DIAGNOSIS — R7309 Other abnormal glucose: Secondary | ICD-10-CM | POA: Diagnosis not present

## 2020-05-15 DIAGNOSIS — E559 Vitamin D deficiency, unspecified: Secondary | ICD-10-CM

## 2020-05-15 DIAGNOSIS — Z79899 Other long term (current) drug therapy: Secondary | ICD-10-CM | POA: Diagnosis not present

## 2020-05-15 DIAGNOSIS — D72810 Lymphocytopenia: Secondary | ICD-10-CM | POA: Diagnosis not present

## 2020-05-15 DIAGNOSIS — E782 Mixed hyperlipidemia: Secondary | ICD-10-CM

## 2020-05-15 DIAGNOSIS — I700 Atherosclerosis of aorta: Secondary | ICD-10-CM

## 2020-05-15 DIAGNOSIS — M21372 Foot drop, left foot: Secondary | ICD-10-CM | POA: Diagnosis not present

## 2020-05-15 DIAGNOSIS — Z6823 Body mass index (BMI) 23.0-23.9, adult: Secondary | ICD-10-CM

## 2020-05-15 DIAGNOSIS — I10 Essential (primary) hypertension: Secondary | ICD-10-CM | POA: Diagnosis not present

## 2020-05-15 NOTE — Patient Instructions (Signed)
Goals    . Blood Pressure < 140/80    . Exercise 150 min/wk Moderate Activity    . LDL CALC < 100        Check blood pressure prior to taking metoprolol twice daily- if running <110/70 take 1/2 tab only   Please monitor your blood pressure, as we get older our body can not respond to a low blood pressure as well as it did when we were younger, for this reason we want a bit higher of a blood pressure as you get older to avoid dizziness and fatigue which can lead to falls. Goal is to maintain <140/80 for the most part, occasionally higher than this is ok.     Please monitor your blood pressure. If it is getting below 130/80 AND you are having fatigue with exertion, dizziness we may need to cut your blood pressure medication in half.   Hypotension As your heart beats, it forces blood through your body. This force is called blood pressure. If you have hypotension, you have low blood pressure. When your blood pressure is too low, you may not get enough blood to your brain. You may feel weak, feel lightheaded, have a fast heartbeat, or even pass out (faint). HOME CARE  Drink enough fluids to keep your pee (urine) clear or pale yellow.  Take all medicines as told by your doctor.  Get up slowly after sitting or lying down.  Wear support stockings as told by your doctor.  Maintain a healthy diet by including foods such as fruits, vegetables, nuts, whole grains, and lean meats. GET HELP IF:  You are throwing up (vomiting) or have watery poop (diarrhea).  You have a fever for more than 2-3 days.  You feel more thirsty than usual.  You feel weak and tired. GET HELP RIGHT AWAY IF:   You pass out (faint).  You have chest pain or a fast or irregular heartbeat.  You lose feeling in part of your body.  You cannot move your arms or legs.  You have trouble speaking.  You get sweaty or feel lightheaded. MAKE SURE YOU:   Understand these instructions.  Will watch your  condition.  Will get help right away if you are not doing well or get worse. Document Released: 05/27/2009 Document Revised: 11/02/2012 Document Reviewed: 09/02/2012 [facility] Patient Information 2015 [place], [place]. This information is not intended to replace advice given to you by your health care provider. Make sure you discuss any questions you have with your health care provider.

## 2020-05-17 LAB — CBC WITH DIFFERENTIAL/PLATELET
Absolute Monocytes: 497 cells/uL (ref 200–950)
Basophils Absolute: 22 cells/uL (ref 0–200)
Basophils Relative: 0.5 %
Eosinophils Absolute: 40 cells/uL (ref 15–500)
Eosinophils Relative: 0.9 %
HCT: 41.6 % (ref 38.5–50.0)
Hemoglobin: 13.9 g/dL (ref 13.2–17.1)
Lymphs Abs: 339 cells/uL — ABNORMAL LOW (ref 850–3900)
MCH: 30.3 pg (ref 27.0–33.0)
MCHC: 33.4 g/dL (ref 32.0–36.0)
MCV: 90.8 fL (ref 80.0–100.0)
MPV: 12 fL (ref 7.5–12.5)
Monocytes Relative: 11.3 %
Neutro Abs: 3502 cells/uL (ref 1500–7800)
Neutrophils Relative %: 79.6 %
Platelets: 165 10*3/uL (ref 140–400)
RBC: 4.58 10*6/uL (ref 4.20–5.80)
RDW: 12.4 % (ref 11.0–15.0)
Total Lymphocyte: 7.7 %
WBC: 4.4 10*3/uL (ref 3.8–10.8)

## 2020-05-17 LAB — COMPLETE METABOLIC PANEL WITH GFR
AG Ratio: 2.2 (calc) (ref 1.0–2.5)
ALT: 23 U/L (ref 9–46)
AST: 21 U/L (ref 10–35)
Albumin: 4.1 g/dL (ref 3.6–5.1)
Alkaline phosphatase (APISO): 71 U/L (ref 35–144)
BUN: 18 mg/dL (ref 7–25)
CO2: 29 mmol/L (ref 20–32)
Calcium: 9.4 mg/dL (ref 8.6–10.3)
Chloride: 106 mmol/L (ref 98–110)
Creat: 0.92 mg/dL (ref 0.70–1.11)
GFR, Est African American: 88 mL/min/{1.73_m2} (ref >=60)
GFR, Est Non African American: 76 mL/min/{1.73_m2} (ref >=60)
Globulin: 1.9 g/dL (calc) (ref 1.9–3.7)
Glucose, Bld: 85 mg/dL (ref 65–99)
Potassium: 4.4 mmol/L (ref 3.5–5.3)
Sodium: 141 mmol/L (ref 135–146)
Total Bilirubin: 0.5 mg/dL (ref 0.2–1.2)
Total Protein: 6 g/dL — ABNORMAL LOW (ref 6.1–8.1)

## 2020-05-17 LAB — TEST AUTHORIZATION

## 2020-05-17 LAB — LIPID PANEL
Cholesterol: 158 mg/dL (ref <200)
HDL: 37 mg/dL — ABNORMAL LOW (ref >=40)
LDL Cholesterol (Calc): 91 mg/dL (calc)
Non-HDL Cholesterol (Calc): 121 mg/dL (calc) (ref <130)
Total CHOL/HDL Ratio: 4.3 (calc) (ref <5.0)
Triglycerides: 198 mg/dL — ABNORMAL HIGH (ref <150)

## 2020-05-17 LAB — TSH: TSH: 1.13 mIU/L (ref 0.40–4.50)

## 2020-05-17 LAB — PATHOLOGIST SMEAR REVIEW

## 2020-05-17 LAB — MAGNESIUM: Magnesium: 2.2 mg/dL (ref 1.5–2.5)

## 2020-06-12 ENCOUNTER — Other Ambulatory Visit: Payer: Self-pay | Admitting: Internal Medicine

## 2020-06-12 DIAGNOSIS — K219 Gastro-esophageal reflux disease without esophagitis: Secondary | ICD-10-CM

## 2020-07-29 NOTE — Progress Notes (Signed)
APPOINTMENT CANCELLED 

## 2020-07-30 ENCOUNTER — Encounter: Payer: Self-pay | Admitting: Internal Medicine

## 2020-07-30 ENCOUNTER — Ambulatory Visit: Payer: Medicare HMO | Admitting: Internal Medicine

## 2020-07-30 ENCOUNTER — Other Ambulatory Visit: Payer: Self-pay

## 2020-07-30 VITALS — BP 150/76 | HR 94 | Temp 97.5°F | Ht 66.0 in | Wt 147.0 lb

## 2020-07-30 DIAGNOSIS — M79605 Pain in left leg: Secondary | ICD-10-CM

## 2020-07-30 MED ORDER — DEXAMETHASONE 4 MG PO TABS: ORAL_TABLET | ORAL | 0 refills | Status: DC

## 2020-08-08 ENCOUNTER — Ambulatory Visit: Payer: Medicare HMO | Admitting: Internal Medicine

## 2020-08-18 NOTE — Progress Notes (Addendum)
Future Appointments  Date Time Provider [place]  08/19/2020 10:30 AM Unk Pinto, MD [facility] None  10/14/2020 10:30 AM Liane Comber, NP [facility] None  02/12/2021 10:00 AM Unk Pinto, MD [facility] None    History of Present Illness:       This very nice 87 y.o. Matthew Aguirre presents for  6  month follow up with HTN, HLD, Pre-Diabetes and Vitamin D Deficiency.        Patient is treated for HTN & BP has been controlled at home. Today's BP was initially elevated & rechecked at goal - 136/76. Patient has had no complaints of any cardiac type chest pain, palpitations, dyspnea / orthopnea / PND, dizziness, claudication, or dependent edema.       Patient has hx/o Prostate Cancer predating from Dec 2020, treated with Ext Radiation by Dr Carrie Mew and followed by Dr Junious Silk & treated with Eligard q20months.      Patient reports noting some blood in his underwear for the last several days.   Denies dysuria.  Has noted decreased urinary stream.        Hyperlipidemia is controlled with diet & meds. Patient denies myalgias or other med SE's. Last Lipids were at goal except elevated Trig's:  Lab Results  Component Value Date   CHOL 158 05/15/2020   HDL 37 (L) 05/15/2020   LDLCALC 91 05/15/2020   TRIG 198 (H) 05/15/2020   CHOLHDL 4.3 05/15/2020    Also, the patient has history of PreDiabetes and has had no symptoms of reactive hypoglycemia, diabetic polys, paresthesias or visual blurring.  Last A1c was normal & at goal:  Lab Results  Component Value Date   HGBA1C 5.5 01/29/2020        Further, the patient also has history of Vitamin D Deficiency and supplements vitamin D without any suspected side-effects. Last vitamin D was at goal:  Lab Results  Component Value Date   VD25OH 83 01/29/2020    Current Outpatient Medications on File Prior to Visit  Medication Sig  . acetaminophen (TYLENOL) 500 MG tablet Take 500 mg by mouth as needed (prn).   Marland Kitchen aspirin 81 MG  EC tablet Take by mouth.  . B Complex Vitamins (VITAMIN B COMPLEX PO) Take 600 mg by mouth daily.  . Calcium Carbonate (CALCIUM 600 PO) Take by mouth daily.  . Cholecalciferol (VITAMIN D PO) Take 2,000 Int'l Units by mouth 2 (two) times daily.   . Cyanocobalamin (VITAMIN B12 PO) Take by mouth daily.  . Flaxseed, Linseed, (FLAX SEED OIL PO) Take 1,200 mg by mouth 3 (three) times daily.   Marland Kitchen gabapentin (NEURONTIN) 100 MG capsule TAKE 1 TO 3 CAPSULES BY MOUTH EVERY NIGHT AT BEDTIME FOR SCIATICA  . leuprolide, 6 Month, (ELIGARD) 45 MG injection Inject 45 mg into the skin every 6 (six) months.  . Magnesium 250 MG TABS Take 250 mg by mouth daily.  . metoprolol tartrate (LOPRESSOR) 50 MG tablet Take 1 tablet (50 mg total) by mouth 2 (two) times daily.  . Omega-3 Fatty Acids (FISH OIL PO) Take 1,200 mg by mouth 2 (two) times daily.   . pantoprazole (PROTONIX) 40 MG tablet Take  1 tablet  Daily  to Prevent Heartburn & Indigestion  . Alpha-Lipoic Acid 300 MG TABS Take 300 mg by mouth daily. (Patient not taking: Reported on 08/19/2020)    Allergies  Allergen Reactions  . Pravastatin   . Red Yeast Rice [Cholestin]   . Sulfa Antibiotics   .  Vibramycin [Doxycycline Calcium]   . Zetia [Ezetimibe]     PMHx:   Past Medical History:  Diagnosis Date  . Essential hypertension 02/15/2007  . Hyperlipidemia 07/13/2013  . Prediabetes 07/13/2013  . Prostate cancer ([place])   . Pulmonary nodule (2008 resolved on f/u)  02/15/2007  . Vitamin D deficiency 07/13/2013    Immunization History  Administered Date(s) Administered  . DT (Pediatric) 05/17/2015  . Influenza, High Dose Seasonal PF  12/01/2017, 01/05/2019, 01/29/2020  . Moderna Sars-Covid-2 Vaccination 05/01/2019, 05/30/2019  . Pneumococcal Conjugate-13 10/12/2013  . Pneumococcal Polysaccharide-23 05/17/2015  . Zoster Recombinat (Shingrix) 08/13/2017, 10/14/2017    Past Surgical History:  Procedure Laterality Date  . PROSTATE BIOPSY  2020  . PROSTATE  BIOPSY  2014  . TONSILECTOMY/ADENOIDECTOMY WITH MYRINGOTOMY    . TREATMENT FISTULA ANAL      FHx:    Reviewed / unchanged  SHx:    Reviewed / unchanged   Systems Review:  Constitutional: Denies fever, chills, wt changes, headaches, insomnia, fatigue, night sweats, change in appetite. Eyes: Denies redness, blurred vision, diplopia, discharge, itchy, watery eyes.  ENT: Denies discharge, congestion, post nasal drip, epistaxis, sore throat, earache, hearing loss, dental pain, tinnitus, vertigo, sinus pain, snoring.  CV: Denies chest pain, palpitations, irregular heartbeat, syncope, dyspnea, diaphoresis, orthopnea, PND, claudication or edema. Respiratory: denies cough, dyspnea, DOE, pleurisy, hoarseness, laryngitis, wheezing.  Gastrointestinal: Denies dysphagia, odynophagia, heartburn, reflux, water brash, abdominal pain or cramps, nausea, vomiting, bloating, diarrhea, constipation, hematemesis, melena, hematochezia  or hemorrhoids. Genitourinary: Denies dysuria, frequency, urgency, nocturia, hesitancy, discharge, hematuria or flank pain. Musculoskeletal: Denies arthralgias, myalgias, stiffness, jt. swelling, pain, limping or strain/sprain.  Skin: Denies pruritus, rash, hives, warts, acne, eczema or change in skin lesion(s). Neuro: No weakness, tremor, incoordination, spasms, paresthesia or pain. Psychiatric: Denies confusion, memory loss or sensory loss. Endo: Denies change in weight, skin or hair change.  Heme/Lymph: No excessive bleeding, bruising or enlarged lymph nodes.  Physical Exam  BP 136/76 (BP Location: Right Arm, Patient Position: Sitting, Cuff Size: Normal)   Pulse 78   Temp (!) 97.3 F (36.3 C)   Resp 17   Ht 5\' 6"  (1.676 m)   Wt 143 lb 6.4 oz (65 kg)   SpO2 99%   BMI 23.15 kg/m   Appears  well nourished, well groomed  and in no distress.  Eyes: PERRLA, EOMs, conjunctiva no swelling or erythema. Sinuses: No frontal/maxillary tenderness ENT/Mouth: EAC's clear, TM's  nl w/o erythema, bulging. Nares clear w/o erythema, swelling, exudates. Oropharynx clear without erythema or exudates. Oral hygiene is good. Tongue normal, non obstructing. Hearing intact.  Neck: Supple. Thyroid not palpable. Car 2+/2+ without bruits, nodes or JVD. Chest: Respirations nl with BS clear & equal w/o rales, rhonchi, wheezing or stridor.  Cor: Heart sounds normal w/ regular rate and rhythm without sig. murmurs, gallops, clicks or rubs. Peripheral pulses normal and equal  without edema.  Abdomen: Soft & bowel sounds normal. Non-tender w/o guarding, rebound, hernias, masses or organomegaly.  Lymphatics: Unremarkable.  Musculoskeletal: Full ROM all peripheral extremities, joint stability, 5/5 strength and normal gait.  Skin: Warm, dry without exposed rashes, lesions or ecchymosis apparent.  Neuro: Cranial nerves intact, reflexes equal bilaterally. Sensory-motor testing grossly intact. Tendon reflexes grossly intact.  Pysch: Alert & oriented x 3.  Insight and judgement nl & appropriate. No ideations.  Assessment and Plan:  - Continue medication, monitor blood pressure at home.  - Continue DASH diet.  Reminder to go to the ER if any  CP,  SOB, nausea, dizziness, severe HA, changes vision/speech.  - Continue diet/meds, exercise,& lifestyle modifications.  - Continue monitor periodic cholesterol/liver & renal functions   1. Essential hypertension  - CBC with Differential/Platelet - COMPLETE METABOLIC PANEL WITH GFR - Magnesium - TSH  2. Hyperlipidemia, mixed  - Lipid panel - TSH - Continue diet, exercise  - Lifestyle modifications.  - Monitor appropriate labs  3. Abnormal glucose  - Hemoglobin A1c - Insulin, random  4. Vitamin D deficiency  - Continue supplementation.  - VITAMIN D 25 Hydroxy  5. Aortic atherosclerosis ([place]) by CT scan 2021  - Lipid panel  6. Malignant neoplasm of prostate ([place])   7. Medication management  - CBC with Differential/Platelet -  COMPLETE METABOLIC PANEL WITH GFR - Magnesium - Lipid panel - TSH - Hemoglobin A1c - Insulin, random - VITAMIN D 25 Hydroxy        Discussed  regular exercise, BP monitoring, weight control to achieve/maintain BMI less than 25 and discussed med and SE's. Recommended labs to assess and monitor clinical status with further disposition pending results of labs.  I discussed the assessment and treatment plan with the patient. The patient was provided an opportunity to ask questions and all were answered. The patient agreed with the plan and demonstrated an understanding of the instructions.  I provided over 30 minutes of exam, counseling, chart review and  complex critical decision making.        The patient was advised to call back or seek an in-person evaluation if the symptoms worsen or if the condition fails to improve as anticipated.   Kirtland Bouchard, MD

## 2020-08-19 ENCOUNTER — Encounter: Payer: Self-pay | Admitting: Internal Medicine

## 2020-08-19 ENCOUNTER — Other Ambulatory Visit: Payer: Self-pay

## 2020-08-19 ENCOUNTER — Ambulatory Visit (INDEPENDENT_AMBULATORY_CARE_PROVIDER_SITE_OTHER): Payer: Medicare HMO | Admitting: Internal Medicine

## 2020-08-19 VITALS — BP 136/76 | HR 78 | Temp 97.3°F | Resp 17 | Ht 66.0 in | Wt 143.4 lb

## 2020-08-19 DIAGNOSIS — I10 Essential (primary) hypertension: Secondary | ICD-10-CM

## 2020-08-19 DIAGNOSIS — E559 Vitamin D deficiency, unspecified: Secondary | ICD-10-CM | POA: Diagnosis not present

## 2020-08-19 DIAGNOSIS — R7309 Other abnormal glucose: Secondary | ICD-10-CM | POA: Diagnosis not present

## 2020-08-19 DIAGNOSIS — I700 Atherosclerosis of aorta: Secondary | ICD-10-CM

## 2020-08-19 DIAGNOSIS — Z79899 Other long term (current) drug therapy: Secondary | ICD-10-CM | POA: Diagnosis not present

## 2020-08-19 DIAGNOSIS — R310 Gross hematuria: Secondary | ICD-10-CM | POA: Diagnosis not present

## 2020-08-19 DIAGNOSIS — E782 Mixed hyperlipidemia: Secondary | ICD-10-CM

## 2020-08-19 DIAGNOSIS — C61 Malignant neoplasm of prostate: Secondary | ICD-10-CM

## 2020-08-19 DIAGNOSIS — R300 Dysuria: Secondary | ICD-10-CM

## 2020-08-19 NOTE — Patient Instructions (Signed)

## 2020-08-20 ENCOUNTER — Other Ambulatory Visit: Payer: Self-pay | Admitting: Adult Health Nurse Practitioner

## 2020-08-20 DIAGNOSIS — M5432 Sciatica, left side: Secondary | ICD-10-CM

## 2020-08-20 LAB — URINALYSIS, ROUTINE W REFLEX MICROSCOPIC
Bilirubin Urine: NEGATIVE
Glucose, UA: NEGATIVE
Hgb urine dipstick: NEGATIVE
Ketones, ur: NEGATIVE
Leukocytes,Ua: NEGATIVE
Nitrite: NEGATIVE
Protein, ur: NEGATIVE
Specific Gravity, Urine: 1.016 (ref 1.001–1.035)
pH: 5.5 (ref 5.0–8.0)

## 2020-08-20 LAB — INSULIN, RANDOM: Insulin: 28.7 u[IU]/mL — ABNORMAL HIGH

## 2020-08-20 LAB — CBC WITH DIFFERENTIAL/PLATELET
Absolute Monocytes: 821 cells/uL (ref 200–950)
Basophils Absolute: 22 cells/uL (ref 0–200)
Basophils Relative: 0.3 %
Eosinophils Absolute: 22 cells/uL (ref 15–500)
Eosinophils Relative: 0.3 %
HCT: 47.8 % (ref 38.5–50.0)
Hemoglobin: 15.4 g/dL (ref 13.2–17.1)
Lymphs Abs: 418 cells/uL — ABNORMAL LOW (ref 850–3900)
MCH: 30 pg (ref 27.0–33.0)
MCHC: 32.2 g/dL (ref 32.0–36.0)
MCV: 93 fL (ref 80.0–100.0)
MPV: 11.6 fL (ref 7.5–12.5)
Monocytes Relative: 11.4 %
Neutro Abs: 5918 cells/uL (ref 1500–7800)
Neutrophils Relative %: 82.2 %
Platelets: 110 10*3/uL — ABNORMAL LOW (ref 140–400)
RBC: 5.14 10*6/uL (ref 4.20–5.80)
RDW: 12.7 % (ref 11.0–15.0)
Total Lymphocyte: 5.8 %
WBC: 7.2 10*3/uL (ref 3.8–10.8)

## 2020-08-20 LAB — LIPID PANEL
Cholesterol: 184 mg/dL (ref <200)
HDL: 47 mg/dL (ref >=40)
LDL Cholesterol (Calc): 111 mg/dL (calc) — ABNORMAL HIGH
Non-HDL Cholesterol (Calc): 137 mg/dL (calc) — ABNORMAL HIGH (ref <130)
Total CHOL/HDL Ratio: 3.9 (calc) (ref <5.0)
Triglycerides: 146 mg/dL (ref <150)

## 2020-08-20 LAB — COMPLETE METABOLIC PANEL WITH GFR
AG Ratio: 2.2 (calc) (ref 1.0–2.5)
ALT: 35 U/L (ref 9–46)
AST: 18 U/L (ref 10–35)
Albumin: 4.1 g/dL (ref 3.6–5.1)
Alkaline phosphatase (APISO): 61 U/L (ref 35–144)
BUN: 16 mg/dL (ref 7–25)
CO2: 29 mmol/L (ref 20–32)
Calcium: 9.6 mg/dL (ref 8.6–10.3)
Chloride: 106 mmol/L (ref 98–110)
Creat: 1 mg/dL (ref 0.70–1.11)
GFR, Est African American: 79 mL/min/{1.73_m2} (ref >=60)
GFR, Est Non African American: 68 mL/min/{1.73_m2} (ref >=60)
Globulin: 1.9 g/dL (calc) (ref 1.9–3.7)
Glucose, Bld: 94 mg/dL (ref 65–99)
Potassium: 5 mmol/L (ref 3.5–5.3)
Sodium: 142 mmol/L (ref 135–146)
Total Bilirubin: 0.7 mg/dL (ref 0.2–1.2)
Total Protein: 6 g/dL — ABNORMAL LOW (ref 6.1–8.1)

## 2020-08-20 LAB — HEMOGLOBIN A1C
Hgb A1c MFr Bld: 5.8 % of total Hgb — ABNORMAL HIGH (ref <5.7)
Mean Plasma Glucose: 120 mg/dL
eAG (mmol/L): 6.6 mmol/L

## 2020-08-20 LAB — URINE CULTURE
MICRO NUMBER:: 11973698
Result:: NO GROWTH
SPECIMEN QUALITY:: ADEQUATE

## 2020-08-20 LAB — TSH: TSH: 1.52 mIU/L (ref 0.40–4.50)

## 2020-08-20 LAB — MAGNESIUM: Magnesium: 2.2 mg/dL (ref 1.5–2.5)

## 2020-08-20 LAB — VITAMIN D 25 HYDROXY (VIT D DEFICIENCY, FRACTURES): Vit D, 25-Hydroxy: 88 ng/mL (ref 30–100)

## 2020-08-20 NOTE — Progress Notes (Signed)
============================================================ -   Test results slightly outside the reference range are not unusual. If there is anything important, I will review this with you,  otherwise it is considered normal test values.  If you have further questions,  please do not hesitate to contact me at the office or via My Chart.  ============================================================ ============================================================  -  U/A showed No Blood   &    Culture was Negative  - > No Infection ============================================================ ============================================================  - Total Chol = 184  Excellent   - Very low risk for Heart Attack  / Stroke ========================================================  - But  bad /dangerous LDL Chol has gone up from 90 -91 to noq 111 -  - Cholesterol is too high   - Recommend a stricter low cholesterol diet   - Cholesterol only comes from animal sources  - ie. meat, dairy, egg yolks  - Eat all the vegetables you want.  - Avoid meat, especially red meat - Beef AND Pork .  - Avoid cheese & dairy - milk & ice cream.     - Cheese is the most concentrated form of trans-fats which  is the worst thing to clog up our arteries.   - Veggie cheese is OK which can be found in the fresh  produce section at [employer] or [employer] or [employer] ============================================================ ============================================================  - A1c  ( 12 week average blood sugar) has gone up a little &                                                               is elevated in the borderline and                                                  early or pre-diabetes range which has the same   300% increased risk for heart attack, stroke,                   cancer and alzheimer- type vascular dementia as full blown diabetes.   But the good news is  that diet, exercise with weight loss can                                                                       cure the early diabetes at this point. ============================================================ ============================================================  - Vitamin D = 88 - Excellent  ============================================================ ============================================================  - All Else - CBC - Kidneys - Electrolytes - Liver - Magnesium & Thyroid    - all  Normal / OK ============================================================ ============================================================

## 2020-08-21 ENCOUNTER — Other Ambulatory Visit: Payer: Self-pay | Admitting: Internal Medicine

## 2020-08-21 DIAGNOSIS — M5432 Sciatica, left side: Secondary | ICD-10-CM

## 2020-08-21 MED ORDER — GABAPENTIN 100 MG PO CAPS: ORAL_CAPSULE | ORAL | 3 refills | Status: DC

## 2020-08-27 ENCOUNTER — Other Ambulatory Visit: Payer: Self-pay | Admitting: Internal Medicine

## 2020-08-27 DIAGNOSIS — M21372 Foot drop, left foot: Secondary | ICD-10-CM

## 2020-08-27 DIAGNOSIS — M48061 Spinal stenosis, lumbar region without neurogenic claudication: Secondary | ICD-10-CM

## 2020-08-28 ENCOUNTER — Telehealth: Payer: Self-pay | Admitting: Internal Medicine

## 2020-08-28 NOTE — Telephone Encounter (Signed)
Returned patient call, Spoke to patient, patient reports 2 recent falls, "legs just give out".   Dr. Unk Pinto called patient to discuss.  He is already established and has been seen in 2022 w/ Dr. Elwin Sleight at [facility].   Patient advised, new referral request is not required, only  to follow up w/ specialist per his last visits' instructions -provider phone number given. Faxed our most recent notes, labs, and this encounter for reference to new falls to Dr. Reatha Armour at [place]. Patient agrees to call.

## 2020-09-09 DIAGNOSIS — G5732 Lesion of lateral popliteal nerve, left lower limb: Secondary | ICD-10-CM | POA: Diagnosis not present

## 2020-09-09 DIAGNOSIS — G541 Lumbosacral plexus disorders: Secondary | ICD-10-CM | POA: Diagnosis not present

## 2020-09-11 ENCOUNTER — Other Ambulatory Visit: Payer: Self-pay

## 2020-09-11 ENCOUNTER — Ambulatory Visit: Payer: Medicare HMO | Attending: Neurological Surgery | Admitting: Physical Therapy

## 2020-09-11 ENCOUNTER — Encounter: Payer: Self-pay | Admitting: Physical Therapy

## 2020-09-11 DIAGNOSIS — R262 Difficulty in walking, not elsewhere classified: Secondary | ICD-10-CM | POA: Diagnosis not present

## 2020-09-11 DIAGNOSIS — M6281 Muscle weakness (generalized): Secondary | ICD-10-CM | POA: Diagnosis not present

## 2020-09-11 DIAGNOSIS — R296 Repeated falls: Secondary | ICD-10-CM

## 2020-09-11 DIAGNOSIS — M21372 Foot drop, left foot: Secondary | ICD-10-CM

## 2020-09-11 NOTE — Therapy (Signed)
[address] [place], [place], [address] Phone: [phone]   Fax:  [phone]  Physical Therapy Evaluation  Patient Details  Name: Matthew Aguirre MRN: [id number] Date of Birth: 1933-08-12 Referring Provider (PT): Dawley   Encounter Date: 09/11/2020   PT End of Session - 09/11/20 1546     Visit Number 1    Date for PT Re-Evaluation 12/04/20    PT Start Time 5993    PT Stop Time 5701    PT Time Calculation (min) 38 min    Activity Tolerance Patient tolerated treatment well;Patient limited by fatigue    Behavior During Therapy [facility] for tasks assessed/performed             Past Medical History:  Diagnosis Date   Essential hypertension 02/15/2007   Hyperlipidemia 07/13/2013   Prediabetes 07/13/2013   Prostate cancer ([place])    Pulmonary nodule (2008 resolved on f/u)  02/15/2007   Vitamin D deficiency 07/13/2013    Past Surgical History:  Procedure Laterality Date   PROSTATE BIOPSY  2020   PROSTATE BIOPSY  2014   TONSILECTOMY/ADENOIDECTOMY WITH MYRINGOTOMY     TREATMENT FISTULA ANAL      There were no vitals filed for this visit.    Subjective Assessment - 09/11/20 1509     Subjective Pt reports to clinic following several falls recently; states that LLE has been giving out. Has had several falls where he reports L knee buckled, "leg just gave out." States that he has been having trouble having strength to lift his left leg. He was seen by this clinic at the end of last year to address foot drop and LE weakness following radiation treatment for prostate cancer. MD hesitant to perform nerve conduction testing and further imaging since extensive testing was completed recently; would like to try PT first. Still wearing DF assist brace on L foot; states foot drop has remained unchanged. Pt ambulates with cane and requires HH assist to walk back to room d/t reports of instability. States he uses the rollator when  alone or going for long distances. Pt reports very nervous about falling and walking with cane now. 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 He is able to access with HRs and assistance of another person but he worries about being able to continue. States having trouble playing the pedals d/t LLE weakness.    Pertinent History Prostate cancer (2021), hypertension, prediabetes    Limitations Standing;Walking    Diagnostic tests MRI, nerve conduction testing    Patient Stated Goals Would like to work on strengthening along with building up confidence walking. Wants to be able to continue playing organ at church (stairs to access).    Currently in Pain? No/denies                [facility] PT Assessment - 09/11/20 0001       Assessment   Medical Diagnosis LLE weakness and falls    Referring Provider (PT) Dawley    Hand Dominance Right    Next MD Visit --   will return PRN   Prior Therapy PT for LLE weakness      Precautions   Precautions Fall      Restrictions   Weight Bearing Restrictions No      Balance Screen   Has the patient fallen in the past 6 months Yes    How many times?  3    Has the patient had a decrease in activity level because of a fear of falling?  Yes    Is the patient reluctant to leave their home because of a fear of falling?  Yes      [place] residence    Living Arrangements Alone    Available Help at Discharge Neighbor;Friend(s)    Type of [place] entrance;Stairs to enter    [place] - single point;Other (comment)      Prior Function   Level of Independence Independent with community mobility with device    Vocation Retired    Leisure plays organ at [employer]; 20 stairs to get up to organ with HR. States needs assistance to get up the stairs. Unable to play pedals on the organ d/t LLE weakness      Observation/Other  Assessments   Focus on Therapeutic Outcomes (FOTO)  45%      Sensation   Light Touch Impaired by gross assessment    Additional Comments L lateral LE numbness down to foot; diminished sensation around L foot and ankle      Functional Tests   Functional tests Sit to Stand      Sit to Stand   Comments UEs pushing from armchair; difficulty with eccentric control, leaning to RLE      Posture/Postural Control   Posture/Postural Control Postural limitations    Postural Limitations Rounded Shoulders;Forward head;Flexed trunk      ROM / Strength   AROM / PROM / Strength Strength      Strength   Strength Assessment Site Hip;Knee;Ankle    Right/Left Hip Right;Left    Right Hip Flexion 4+/5    Left Hip Flexion 4/5    Right/Left Knee Right;Left    Right Knee Flexion 5/5    Right Knee Extension 5/5    Left Knee Flexion 4+/5    Left Knee Extension 3-/5    Right/Left Ankle Right;Left    Right Ankle Dorsiflexion 5/5    Right Ankle Plantar Flexion 4/5    Left Ankle Dorsiflexion 2-/5    Left Ankle Plantar Flexion 4-/5      Ambulation/Gait   Ambulation/Gait Yes    Ambulation/Gait Assistance 4: Min assist    Ambulation/Gait Assistance Details SPC and HH assist d/t fear of sudden knee instability    Ambulation Distance (Feet) 50 Feet    Assistive device 1 person hand held assist;Straight cane    Gait Pattern Step-to pattern;Decreased stance time - left;Decreased step length - left;Decreased hip/knee flexion - left;Decreased dorsiflexion - left;Left steppage;Poor foot clearance - left    Ambulation Surface Level    Gait velocity diminished      Standardized Balance Assessment   Standardized Balance Assessment Five Times Sit to Stand    Five times sit to stand comments  28.74 seconds with UEs pushing from arms of standard height chair                        Objective measurements completed on examination: See above findings.               PT Education -  09/11/20 1546     Education Details Pt educated on POC and HEP    Person(s) Educated Patient    Methods Explanation;Demonstration;Handout    Comprehension Verbalized understanding;Returned demonstration  PT Short Term Goals - 09/11/20 1629       PT SHORT TERM GOAL #1   Title Pt will be I with initial HEP    Time 2    Period Weeks    Status New    Target Date 09/25/20               PT Long Term Goals - 09/11/20 1630       PT LONG TERM GOAL #1   Title Patient will improve FOTO score from 45% to 61%    Time 8    Period Weeks    Status New    Target Date 11/06/20      PT LONG TERM GOAL #2   Title Pt will report no additional falls    Time 8    Period Weeks    Status New    Target Date 11/06/20      PT LONG TERM GOAL #3   Title Pt will report able to return to gym for independent maintenance program    Baseline pt reports had to stop d/t increase in L sided LBP/leg pain when working 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 Pt was seen by this clinic last year for LLE weakness and L foot drop following radiation for prostate cancer. Ambulates into clinic with [facility] and requires [place] assist d/t fear of falling. Has been walking with rollator at home. Today, he demos progressive quad weakness, sensation deficits lateral LLE into L foot, and functional LE weakness. Quad strength diminished since last round of PT. Needed UEs to rise from sitting and demos decreased eccentric control with STS. Pt is high fall risk per 5xSTS score. MD would like to trial PT to  see if we can regain some LE strength before having pt undergo repeat testing of nerve conduction and repeat imaging studies. Pt would benefit from skilled PT to address the above impairments.    Examination-Activity Limitations Stand;Stairs;Locomotion Level    Examination-Participation Restrictions Church;Community Activity;Interpersonal Relationship    Stability/Clinical Decision Making Evolving/Moderate complexity    Clinical Decision Making Moderate    Rehab Potential Good    PT Frequency 2x / week    PT Duration 8 weeks    PT Treatment/Interventions ADLs/Self Care Home Management;Electrical Stimulation;Neuromuscular re-education;Balance training;Therapeutic exercise;Therapeutic activities;Functional mobility training;Stair training;Gait training;Patient/family education;Manual techniques    PT Next Visit Plan quad strengthening, functional LE strengthening, stair progression, high level balance ex's    PT Home Exercise Plan added supine QS and SAQ to HEP from last round of PT    Consulted and Agree with Plan of Care Patient             Patient will benefit from skilled therapeutic intervention in order to improve the following deficits and impairments:  Abnormal gait, Decreased range of motion, Difficulty walking, Decreased endurance,  Decreased activity tolerance, Decreased balance, Decreased mobility, Decreased strength, Impaired sensation  Visit Diagnosis: Difficulty in walking, not elsewhere classified  Foot drop, left  Muscle weakness (generalized)  Repeated falls     Problem List Patient Active Problem List   Diagnosis Date Noted   Radiation-induced lumbosacral plexopathy 05/15/2020   Left foot drop 05/13/2020   Aortic atherosclerosis ([place]) by CT scan 2021 09/28/2019   Plantar fasciitis of left foot 09/21/2018   Gastroesophageal reflux disease without esophagitis 09/21/2018   Other abnormal glucose 02/13/2015   BMI 23.0-23.9, adult 02/13/2015   Pulmonary  sarcoidosis (1988)  07/29/2014   Malignant neoplasm of prostate ([place]) 04/19/2014   Hyperlipidemia 07/13/2013   Vitamin D deficiency 07/13/2013   Medication management 07/13/2013   Essential hypertension 02/15/2007   Amador Cunas, PT, DPT [facility]se [facility] 09/11/2020, 4:32 PM  [address] [place], [place], [address] Phone: [phone]   Fax:  [phone]  Name: SHERIDAN GETTEL MRN: [id number] Date of Birth: 1933-11-21

## 2020-09-11 NOTE — Patient Instructions (Signed)
Access Code: [email or web address] URL: [email or web address] Date: 09/11/2020 Prepared by: Amador Cunas  Exercises Supine Quad Set - 1 x daily - 7 x weekly - 3 sets - 10 reps - 3 sec hold Supine Short Arc Quad - 1 x daily - 7 x weekly - 3 sets - 10 reps - 3 sec hold

## 2020-09-18 ENCOUNTER — Other Ambulatory Visit: Payer: Self-pay

## 2020-09-18 ENCOUNTER — Ambulatory Visit: Payer: Medicare HMO | Attending: Neurological Surgery | Admitting: Physical Therapy

## 2020-09-18 ENCOUNTER — Encounter: Payer: Self-pay | Admitting: Physical Therapy

## 2020-09-18 DIAGNOSIS — M21372 Foot drop, left foot: Secondary | ICD-10-CM | POA: Diagnosis not present

## 2020-09-18 DIAGNOSIS — M6281 Muscle weakness (generalized): Secondary | ICD-10-CM | POA: Diagnosis not present

## 2020-09-18 DIAGNOSIS — M5432 Sciatica, left side: Secondary | ICD-10-CM | POA: Diagnosis not present

## 2020-09-18 DIAGNOSIS — R262 Difficulty in walking, not elsewhere classified: Secondary | ICD-10-CM | POA: Diagnosis not present

## 2020-09-18 DIAGNOSIS — R296 Repeated falls: Secondary | ICD-10-CM | POA: Insufficient documentation

## 2020-09-18 NOTE — Therapy (Signed)
[place] [place], [place], [address] Phone: [phone]   Fax:  [phone]  Physical Therapy Treatment  Patient Details  Name: BLASE BECKNER MRN: [id number] Date of Birth: 28-Jan-1934 Referring Provider (PT): Dawley   Encounter Date: 09/18/2020   PT End of Session - 09/18/20 1342     Visit Number 2    Date for PT Re-Evaluation 12/04/20    Activity Tolerance Patient tolerated treatment well    Behavior During Therapy [facility] for tasks assessed/performed             Past Medical History:  Diagnosis Date   Essential hypertension 02/15/2007   Hyperlipidemia 07/13/2013   Prediabetes 07/13/2013   Prostate cancer [facility])    Pulmonary nodule (2008 resolved on f/u)  02/15/2007   Vitamin D deficiency 07/13/2013    Past Surgical History:  Procedure Laterality Date   PROSTATE BIOPSY  2020   PROSTATE BIOPSY  2014   TONSILECTOMY/ADENOIDECTOMY WITH MYRINGOTOMY     TREATMENT FISTULA ANAL      There were no vitals filed for this visit.   Subjective Assessment - 09/18/20 1303     Subjective No falls since eval, no change    Currently in Pain? No/denies                               [facility] Adult PT Treatment/Exercise - 09/18/20 0001       Exercises   Exercises Knee/Hip      Knee/Hip Exercises: Aerobic   Nustep L3 x6 min      Knee/Hip Exercises: Standing   Other Standing Knee Exercises Standing RLE flex for LLE SLS 3x5      Knee/Hip Exercises: Seated   Long Arc Quad Left;2 sets;5 reps    Long Arc Quad Weight 1 lbs.    Other Seated Knee/Hip Exercises Eccentric LAQ pt plced in full L knee ext asked to hold LE straight, LLE hip flex red 2x10    Other Seated Knee/Hip Exercises Fitter press 1 black x15 then w/ red TKE x15    Hamstring Curl Left;2 sets;10 reps;15 reps    Hamstring Limitations hreen tband    Sit to Sand 1 set;5 reps;with UE support   Elevated mat table                      PT Short Term Goals - 09/11/20 1629       PT SHORT TERM GOAL #1   Title Pt will be I with initial HEP    Time 2    Period Weeks    Status New    Target Date 09/25/20               PT Long Term Goals - 09/11/20 1630       PT LONG TERM GOAL #1   Title Patient will improve FOTO score from 45% to 61%    Time 8    Period Weeks    Status New    Target Date 11/06/20      PT LONG TERM GOAL #2   Title Pt will report no additional falls    Time 8    Period Weeks    Status New    Target Date 11/06/20      PT LONG TERM GOAL #3   Title Pt will report able to return to gym for independent maintenance program  Baseline pt reports had to stop d/t increase in L sided LBP/leg pain when working with trainer at [employer]    Time 8    Period Weeks    Status New    Target Date 11/06/20      PT LONG TERM GOAL #4   Title Pt will demo 5xSTS <20 sec with UEs pushing from thighs from standard height chair    Time 8    Period Weeks    Status New    Target Date 11/06/20                   Plan - 09/18/20 1343     Clinical Impression Statement Pt completed today's interventions. Pt is able to extend his LLE but unable to reach full TKE. Therapist able fully extend Pt's L knee but pt is unable to hold leg in that position. Some L knee buckling with standing R hip and knee flexion. L LAQ is very weak and pt fatigues quick.    Examination-Activity Limitations Stand;Stairs;Locomotion Level    Examination-Participation Restrictions Church;Community Activity;Interpersonal Relationship    Stability/Clinical Decision Making Evolving/Moderate complexity    Rehab Potential Good    PT Frequency 2x / week    PT Duration 8 weeks    PT Treatment/Interventions ADLs/Self Care Home Management;Electrical Stimulation;Neuromuscular re-education;Balance training;Therapeutic exercise;Therapeutic activities;Functional mobility training;Stair training;Gait training;Patient/family  education;Manual techniques    PT Next Visit Plan quad strengthening, functional LE strengthening, stair progression, high level balance ex's             Patient will benefit from skilled therapeutic intervention in order to improve the following deficits and impairments:  Abnormal gait, Decreased range of motion, Difficulty walking, Decreased endurance, Decreased activity tolerance, Decreased balance, Decreased mobility, Decreased strength, Impaired sensation  Visit Diagnosis: Muscle weakness (generalized)  Difficulty in walking, not elsewhere classified     Problem List Patient Active Problem List   Diagnosis Date Noted   Radiation-induced lumbosacral plexopathy 05/15/2020   Left foot drop 05/13/2020   Aortic atherosclerosis ([place]) by CT scan 2021 09/28/2019   Plantar fasciitis of left foot 09/21/2018   Gastroesophageal reflux disease without esophagitis 09/21/2018   Other abnormal glucose 02/13/2015   BMI 23.0-23.9, adult 02/13/2015   Pulmonary sarcoidosis (1988)  07/29/2014   Malignant neoplasm of prostate ([place]) 04/19/2014   Hyperlipidemia 07/13/2013   Vitamin D deficiency 07/13/2013   Medication management 07/13/2013   Essential hypertension 02/15/2007    Scot Jun 09/18/2020, 1:48 PM  [address]. [place], [place], [address] Phone: [phone]   Fax:  [phone]  Name: COBI DELPH MRN: [id number] Date of Birth: June 11, 1933

## 2020-09-19 ENCOUNTER — Ambulatory Visit: Payer: Medicare HMO | Admitting: Physical Therapy

## 2020-09-19 ENCOUNTER — Encounter: Payer: Self-pay | Admitting: Physical Therapy

## 2020-09-19 DIAGNOSIS — M21372 Foot drop, left foot: Secondary | ICD-10-CM

## 2020-09-19 DIAGNOSIS — M6281 Muscle weakness (generalized): Secondary | ICD-10-CM

## 2020-09-19 DIAGNOSIS — R262 Difficulty in walking, not elsewhere classified: Secondary | ICD-10-CM

## 2020-09-19 DIAGNOSIS — M5432 Sciatica, left side: Secondary | ICD-10-CM | POA: Diagnosis not present

## 2020-09-19 DIAGNOSIS — R296 Repeated falls: Secondary | ICD-10-CM | POA: Diagnosis not present

## 2020-09-19 NOTE — Therapy (Signed)
[place]. [place], [place], [address] Phone: [phone]   Fax:  [phone]  Physical Therapy Treatment  Patient Details  Name: Matthew Aguirre MRN: [id number] Date of Birth: 01-Oct-1933 Referring Provider (PT): Dawley   Encounter Date: 09/19/2020   PT End of Session - 09/19/20 1555     Visit Number 3    Date for PT Re-Evaluation 12/04/20    PT Start Time 2025    PT Stop Time 4270    PT Time Calculation (min) 41 min    Activity Tolerance Patient tolerated treatment well    Behavior During Therapy [facility] for tasks assessed/performed             Past Medical History:  Diagnosis Date   Essential hypertension 02/15/2007   Hyperlipidemia 07/13/2013   Prediabetes 07/13/2013   Prostate cancer ([place])    Pulmonary nodule (2008 resolved on f/u)  02/15/2007   Vitamin D deficiency 07/13/2013    Past Surgical History:  Procedure Laterality Date   PROSTATE BIOPSY  2020   PROSTATE BIOPSY  2014   TONSILECTOMY/ADENOIDECTOMY WITH MYRINGOTOMY     TREATMENT FISTULA ANAL      There were no vitals filed for this visit.   Subjective Assessment - 09/19/20 1521     Subjective Left leg feels touchy today. Numbness in lateral L shaft into the top of foot    Currently in Pain? No/denies                               [facility] Adult PT Treatment/Exercise - 09/19/20 0001       Knee/Hip Exercises: Aerobic   Recumbent Bike L1 x 6 min      Knee/Hip Exercises: Standing   Other Standing Knee Exercises Standing marches wSPC x10    Other Standing Knee Exercises St      Knee/Hip Exercises: Seated   Long Arc Quad Left;5 reps;3 sets    Long Arc Quad Weight 1 lbs.    Ball Squeeze 2x10''    Other Seated Knee/Hip Exercises Eccentric LAQ pt placed in full L knee ext asked to hold LE straight 1lb, LLE hip flex red 2x10    Marching Left;2 sets;10 reps    Marching Weights 1 lbs.    Hamstring Curl Left;2 sets;10  reps    Hamstring Limitations blue     Hamstring Weights 1 lbs.                      PT Short Term Goals - 09/11/20 1629       PT SHORT TERM GOAL #1   Title Pt will be I with initial HEP    Time 2    Period Weeks    Status New    Target Date 09/25/20               PT Long Term Goals - 09/11/20 1630       PT LONG TERM GOAL #1   Title Patient will improve FOTO score from 45% to 61%    Time 8    Period Weeks    Status New    Target Date 11/06/20      PT LONG TERM GOAL #2   Title Pt will report no additional falls    Time 8    Period Weeks    Status New    Target Date 11/06/20  PT LONG TERM GOAL #3   Title Pt will report able to return to gym for independent maintenance program    Baseline pt reports had to stop d/t increase in L sided LBP/leg pain when working with trainer at [employer]    Time 8    Period Weeks    Status New    Target Date 11/06/20      PT LONG TERM GOAL #4   Title Pt will demo 5xSTS <20 sec with UEs pushing from thighs from standard height chair    Time 8    Period Weeks    Status New    Target Date 11/06/20                   Plan - 09/19/20 1556     Clinical Impression Statement Pt continues to have difficulty fully extending LLE. He has full L knee extension, but is unable to hold L quad contraction to maintain full L knee ex once therapist puts him in that position. Pt required mod assist to lock L knee with SLS during R hip flexion.Good HS strength, fatigue quick with activity    Examination-Participation Restrictions Church;Community Activity;Interpersonal Relationship    Stability/Clinical Decision Making Evolving/Moderate complexity    Rehab Potential Good    PT Frequency 2x / week    PT Duration 8 weeks    PT Treatment/Interventions ADLs/Self Care Home Management;Electrical Stimulation;Neuromuscular re-education;Balance training;Therapeutic exercise;Therapeutic activities;Functional mobility training;Stair  training;Gait training;Patient/family education;Manual techniques    PT Next Visit Plan quad strengthening, functional LE strengthening, stair progression, high level balance ex's             Patient will benefit from skilled therapeutic intervention in order to improve the following deficits and impairments:  Abnormal gait, Decreased range of motion, Difficulty walking, Decreased endurance, Decreased activity tolerance, Decreased balance, Decreased mobility, Decreased strength, Impaired sensation  Visit Diagnosis: Foot drop, left  Difficulty in walking, not elsewhere classified  Repeated falls  Muscle weakness (generalized)     Problem List Patient Active Problem List   Diagnosis Date Noted   Radiation-induced lumbosacral plexopathy 05/15/2020   Left foot drop 05/13/2020   Aortic atherosclerosis ([place]) by CT scan 2021 09/28/2019   Plantar fasciitis of left foot 09/21/2018   Gastroesophageal reflux disease without esophagitis 09/21/2018   Other abnormal glucose 02/13/2015   BMI 23.0-23.9, adult 02/13/2015   Pulmonary sarcoidosis (1988)  07/29/2014   Malignant neoplasm of prostate ([place]) 04/19/2014   Hyperlipidemia 07/13/2013   Vitamin D deficiency 07/13/2013   Medication management 07/13/2013   Essential hypertension 02/15/2007    Scot Jun, PTA 09/19/2020, 4:00 PM  [address]. [place], [place], [address] Phone: [phone]   Fax:  [phone]  Name: Matthew Aguirre MRN: [id number] Date of Birth: 1933-06-08

## 2020-09-23 ENCOUNTER — Other Ambulatory Visit: Payer: Self-pay

## 2020-09-23 ENCOUNTER — Encounter: Payer: Self-pay | Admitting: Physical Therapy

## 2020-09-23 ENCOUNTER — Ambulatory Visit: Payer: Medicare HMO | Admitting: Physical Therapy

## 2020-09-23 DIAGNOSIS — R296 Repeated falls: Secondary | ICD-10-CM

## 2020-09-23 DIAGNOSIS — M21372 Foot drop, left foot: Secondary | ICD-10-CM

## 2020-09-23 DIAGNOSIS — R262 Difficulty in walking, not elsewhere classified: Secondary | ICD-10-CM | POA: Diagnosis not present

## 2020-09-23 DIAGNOSIS — M6281 Muscle weakness (generalized): Secondary | ICD-10-CM | POA: Diagnosis not present

## 2020-09-23 DIAGNOSIS — M5432 Sciatica, left side: Secondary | ICD-10-CM | POA: Diagnosis not present

## 2020-09-23 NOTE — Therapy (Signed)
[place]. [place], [place], [address] Phone: [phone]   Fax:  [phone]  Physical Therapy Treatment  Patient Details  Name: Matthew Aguirre MRN: [id number] Date of Birth: 1933-12-28 Referring Provider (PT): Dawley   Encounter Date: 09/23/2020   PT End of Session - 09/23/20 1429     Visit Number 4    Date for PT Re-Evaluation 12/04/20    PT Start Time 7867    PT Stop Time 1430    PT Time Calculation (min) 45 min    Activity Tolerance Patient tolerated treatment well    Behavior During Therapy [facility] for tasks assessed/performed             Past Medical History:  Diagnosis Date   Essential hypertension 02/15/2007   Hyperlipidemia 07/13/2013   Prediabetes 07/13/2013   Prostate cancer ([place])    Pulmonary nodule (2008 resolved on f/u)  02/15/2007   Vitamin D deficiency 07/13/2013    Past Surgical History:  Procedure Laterality Date   PROSTATE BIOPSY  2020   PROSTATE BIOPSY  2014   TONSILECTOMY/ADENOIDECTOMY WITH MYRINGOTOMY     TREATMENT FISTULA ANAL      There were no vitals filed for this visit.   Subjective Assessment - 09/23/20 1345     Subjective Doing ok, had a catch in the R hip this morning when doing laundry    Currently in Pain? No/denies                               [facility] Adult PT Treatment/Exercise - 09/23/20 0001       Knee/Hip Exercises: Aerobic   Nustep L2 LE only x 6 min      Knee/Hip Exercises: Machines for Strengthening   Cybex Leg Press 20lb 2x10, TKE LLE 20lb 2x5, LLE no weight x5      Knee/Hip Exercises: Standing   Other Standing Knee Exercises Standing marches w/SPC x10      Knee/Hip Exercises: Seated   Long Arc Quad Left;5 reps;2 sets;10 reps    Ball Squeeze 2x10''    Other Seated Knee/Hip Exercises Eccentric LAQ pt placed in full L knee ext asked to hold LE straight 1lb 2x8, LLE hip flex red 2x10    Other Seated Knee/Hip Exercises TKE green  2x10LLE    Sit to Sand 2 sets;5 reps;with UE support                      PT Short Term Goals - 09/11/20 1629       PT SHORT TERM GOAL #1   Title Pt will be I with initial HEP    Time 2    Period Weeks    Status New    Target Date 09/25/20               PT Long Term Goals - 09/11/20 1630       PT LONG TERM GOAL #1   Title Patient will improve FOTO score from 45% to 61%    Time 8    Period Weeks    Status New    Target Date 11/06/20      PT LONG TERM GOAL #2   Title Pt will report no additional falls    Time 8    Period Weeks    Status New    Target Date 11/06/20      PT  LONG TERM GOAL #3   Title Pt will report able to return to gym for independent maintenance program    Baseline pt reports had to stop d/t increase in L sided LBP/leg pain when working with trainer at [employer]    Time 8    Period Weeks    Status New    Target Date 11/06/20      PT LONG TERM GOAL #4   Title Pt will demo 5xSTS <20 sec with UEs pushing from thighs from standard height chair    Time 8    Period Weeks    Status New    Target Date 11/06/20                   Plan - 09/23/20 1434     Clinical Impression Statement Decrease L quad strength and active extension remains. Pt is unable to lock his L knee in full extension actively. Pt uses compensatory strategies with closed chain interventions to extend LLE. Some quad contraction felt with quad sets. L quad atrophy noted.    Examination-Activity Limitations Stand;Stairs;Locomotion Level    Examination-Participation Restrictions Church;Community Activity;Interpersonal Relationship    Stability/Clinical Decision Making Evolving/Moderate complexity    Rehab Potential Good    PT Frequency 2x / week    PT Duration 8 weeks    PT Treatment/Interventions ADLs/Self Care Home Management;Electrical Stimulation;Neuromuscular re-education;Balance training;Therapeutic exercise;Therapeutic activities;Functional mobility  training;Stair training;Gait training;Patient/family education;Manual techniques    PT Next Visit Plan quad strengthening, functional LE strengthening, stair progression, high level balance ex's             Patient will benefit from skilled therapeutic intervention in order to improve the following deficits and impairments:  Abnormal gait, Decreased range of motion, Difficulty walking, Decreased endurance, Decreased activity tolerance, Decreased balance, Decreased mobility, Decreased strength, Impaired sensation  Visit Diagnosis: Difficulty in walking, not elsewhere classified  Repeated falls  Muscle weakness (generalized)  Foot drop, left     Problem List Patient Active Problem List   Diagnosis Date Noted   Radiation-induced lumbosacral plexopathy 05/15/2020   Left foot drop 05/13/2020   Aortic atherosclerosis ([place]) by CT scan 2021 09/28/2019   Plantar fasciitis of left foot 09/21/2018   Gastroesophageal reflux disease without esophagitis 09/21/2018   Other abnormal glucose 02/13/2015   BMI 23.0-23.9, adult 02/13/2015   Pulmonary sarcoidosis (1988)  07/29/2014   Malignant neoplasm of prostate ([place]) 04/19/2014   Hyperlipidemia 07/13/2013   Vitamin D deficiency 07/13/2013   Medication management 07/13/2013   Essential hypertension 02/15/2007    Scot Jun, PTA 09/23/2020, 2:37 PM  [place]. [place], [place], [address] Phone: [phone]   Fax:  [phone]  Name: Matthew Aguirre MRN: [id number] Date of Birth: 05-Sep-1933

## 2020-09-25 ENCOUNTER — Ambulatory Visit: Payer: Medicare HMO | Admitting: Physical Therapy

## 2020-09-25 ENCOUNTER — Other Ambulatory Visit: Payer: Self-pay

## 2020-09-25 ENCOUNTER — Encounter: Payer: Self-pay | Admitting: Physical Therapy

## 2020-09-25 DIAGNOSIS — R262 Difficulty in walking, not elsewhere classified: Secondary | ICD-10-CM

## 2020-09-25 DIAGNOSIS — M21372 Foot drop, left foot: Secondary | ICD-10-CM | POA: Diagnosis not present

## 2020-09-25 DIAGNOSIS — M5432 Sciatica, left side: Secondary | ICD-10-CM | POA: Diagnosis not present

## 2020-09-25 DIAGNOSIS — R296 Repeated falls: Secondary | ICD-10-CM | POA: Diagnosis not present

## 2020-09-25 DIAGNOSIS — M6281 Muscle weakness (generalized): Secondary | ICD-10-CM

## 2020-09-25 NOTE — Therapy (Signed)
[address]. [place], [place], [address] Phone: [phone]   Fax:  [phone]  Physical Therapy Treatment  Patient Details  Name: Matthew Aguirre MRN: [id number] Date of Birth: Aug 07, 1933 Referring Provider (PT): Dawley   Encounter Date: 09/25/2020   PT End of Session - 09/25/20 1520     Visit Number 5    Date for PT Re-Evaluation 12/04/20    PT Start Time 1430    PT Stop Time 1510    PT Time Calculation (min) 40 min    Activity Tolerance Patient tolerated treatment well    Behavior During Therapy [facility] for tasks assessed/performed             Past Medical History:  Diagnosis Date   Essential hypertension 02/15/2007   Hyperlipidemia 07/13/2013   Prediabetes 07/13/2013   Prostate cancer ([place])    Pulmonary nodule (2008 resolved on f/u)  02/15/2007   Vitamin D deficiency 07/13/2013    Past Surgical History:  Procedure Laterality Date   PROSTATE BIOPSY  2020   PROSTATE BIOPSY  2014   TONSILECTOMY/ADENOIDECTOMY WITH MYRINGOTOMY     TREATMENT FISTULA ANAL      There were no vitals filed for this visit.   Subjective Assessment - 09/25/20 1440     Subjective Had some stiffness in the low back earlier this morning    Currently in Pain? No/denies    Pain Location Ankle    Pain Orientation Left    Pain Descriptors / Indicators Tightness                               [facility] Adult PT Treatment/Exercise - 09/25/20 0001       High Level Balance   High Level Balance Activities Side stepping;Backward walking   LOB x 1 max assit to correct     Knee/Hip Exercises: Aerobic   Nustep L2 LE only x 6 min      Knee/Hip Exercises: Seated   Other Seated Knee/Hip Exercises Eccentric LAQ pt placed in full L knee ext asked to hold LE straight 1lb 2x8, LLE hip flex red 2x10    Sit to Sand 2 sets;5 reps;with UE support      Knee/Hip Exercises: Supine   Short Arc Quad Sets Left;Strengthening;2  sets;5 sets    Short Arc Quad Sets Limitations 1    Straight Leg Raises Strengthening;Left;2 sets;10 reps   exxentrics                     PT Short Term Goals - 09/11/20 1629       PT SHORT TERM GOAL #1   Title Pt will be I with initial HEP    Time 2    Period Weeks    Status New    Target Date 09/25/20               PT Long Term Goals - 09/11/20 1630       PT LONG TERM GOAL #1   Title Patient will improve FOTO score from 45% to 61%    Time 8    Period Weeks    Status New    Target Date 11/06/20      PT LONG TERM GOAL #2   Title Pt will report no additional falls    Time 8    Period Weeks    Status New  Target Date 11/06/20      PT LONG TERM GOAL #3   Title Pt will report able to return to gym for independent maintenance program    Baseline pt reports had to stop d/t increase in L sided LBP/leg pain when working with trainer at [employer]    Time 8    Period Weeks    Status New    Target Date 11/06/20      PT LONG TERM GOAL #4   Title Pt will demo 5xSTS <20 sec with UEs pushing from thighs from standard height chair    Time 8    Period Weeks    Status New    Target Date 11/06/20                   Plan - 09/25/20 1521     Clinical Impression Statement Pt able to achieve L quad contraction but he is too weak to extend L knee. LOB x1 with backwards walking needing max assist to recover. Assist needed with LAQ and supine SLR working on eccentric holds. Compensation needed with sit to stands.    Examination-Activity Limitations Stand;Stairs;Locomotion Level    Examination-Participation Restrictions Church;Community Activity;Interpersonal Relationship    Stability/Clinical Decision Making Evolving/Moderate complexity    Rehab Potential Good    PT Frequency 2x / week    PT Duration 8 weeks    PT Treatment/Interventions ADLs/Self Care Home Management;Electrical Stimulation;Neuromuscular re-education;Balance training;Therapeutic  exercise;Therapeutic activities;Functional mobility training;Stair training;Gait training;Patient/family education;Manual techniques    PT Next Visit Plan quad strengthening, functional LE strengthening, stair progression, high level balance ex's             Patient will benefit from skilled therapeutic intervention in order to improve the following deficits and impairments:  Abnormal gait, Decreased range of motion, Difficulty walking, Decreased endurance, Decreased activity tolerance, Decreased balance, Decreased mobility, Decreased strength, Impaired sensation  Visit Diagnosis: Repeated falls  Muscle weakness (generalized)  Difficulty in walking, not elsewhere classified     Problem List Patient Active Problem List   Diagnosis Date Noted   Radiation-induced lumbosacral plexopathy 05/15/2020   Left foot drop 05/13/2020   Aortic atherosclerosis ([place]) by CT scan 2021 09/28/2019   Plantar fasciitis of left foot 09/21/2018   Gastroesophageal reflux disease without esophagitis 09/21/2018   Other abnormal glucose 02/13/2015   BMI 23.0-23.9, adult 02/13/2015   Pulmonary sarcoidosis (1988)  07/29/2014   Malignant neoplasm of prostate ([place]) 04/19/2014   Hyperlipidemia 07/13/2013   Vitamin D deficiency 07/13/2013   Medication management 07/13/2013   Essential hypertension 02/15/2007    Scot Jun 09/25/2020, 3:24 PM  [place] [address]. [place], [place], [address] Phone: [phone]   Fax:  [phone]  Name: KEBRON PULSE MRN: [id number] Date of Birth: 06/24/1933

## 2020-09-30 ENCOUNTER — Encounter: Payer: Self-pay | Admitting: Physical Therapy

## 2020-09-30 ENCOUNTER — Other Ambulatory Visit: Payer: Self-pay

## 2020-09-30 ENCOUNTER — Ambulatory Visit: Payer: Medicare HMO | Admitting: Physical Therapy

## 2020-09-30 DIAGNOSIS — M6281 Muscle weakness (generalized): Secondary | ICD-10-CM

## 2020-09-30 DIAGNOSIS — M21372 Foot drop, left foot: Secondary | ICD-10-CM | POA: Diagnosis not present

## 2020-09-30 DIAGNOSIS — M5432 Sciatica, left side: Secondary | ICD-10-CM

## 2020-09-30 DIAGNOSIS — R262 Difficulty in walking, not elsewhere classified: Secondary | ICD-10-CM

## 2020-09-30 DIAGNOSIS — R296 Repeated falls: Secondary | ICD-10-CM

## 2020-09-30 NOTE — Therapy (Signed)
[address] [place], [place], [address] Phone: [phone]   Fax:  [phone]  Physical Therapy Treatment  Patient Details  Name: Matthew Aguirre MRN: [id number] Date of Birth: 03-25-1933 Referring Provider (PT): Dawley   Encounter Date: 09/30/2020   PT End of Session - 09/30/20 1438     Visit Number 6    Date for PT Re-Evaluation 12/04/20    PT Start Time 0102    PT Stop Time 7253    PT Time Calculation (min) 44 min    Activity Tolerance Patient tolerated treatment well    Behavior During Therapy [facility] for tasks assessed/performed             Past Medical History:  Diagnosis Date   Essential hypertension 02/15/2007   Hyperlipidemia 07/13/2013   Prediabetes 07/13/2013   Prostate cancer ([place])    Pulmonary nodule (2008 resolved on f/u)  02/15/2007   Vitamin D deficiency 07/13/2013    Past Surgical History:  Procedure Laterality Date   PROSTATE BIOPSY  2020   PROSTATE BIOPSY  2014   TONSILECTOMY/ADENOIDECTOMY WITH MYRINGOTOMY     TREATMENT FISTULA ANAL      There were no vitals filed for this visit.   Subjective Assessment - 09/30/20 1351     Subjective Pt reports a fall thins morning, was getting ready to go up the steps. No injuries, able to get up. Some stiffness in his low back    Currently in Pain? No/denies                               [facility] Adult PT Treatment/Exercise - 09/30/20 0001       Knee/Hip Exercises: Aerobic   Recumbent Bike L1.7 x  min    Nustep L3 LE only x 6 min      Knee/Hip Exercises: Standing   Other Standing Knee Exercises Stairs 4" x5   up with L down with R working on L quad activation     Knee/Hip Exercises: Seated   Long Arc Quad Left;2 sets;5 reps   eccentic control 2x10   Ball Squeeze 2x10''    Other Seated Knee/Hip Exercises Eccentric LAQ pt placed in full L knee ext asked to hold LE straight 2x10      Knee/Hip Exercises: Supine   Short  Arc Quad Sets Left;Strengthening;2 sets;10 reps    Bridges Both;10 reps    Straight Leg Raises Strengthening;Left;5 reps;3 sets   eccentrics   Other Supine Knee/Hip Exercises hip abduction L with heel on towel x10                      PT Short Term Goals - 09/11/20 1629       PT SHORT TERM GOAL #1   Title Pt will be I with initial HEP    Time 2    Period Weeks    Status New    Target Date 09/25/20               PT Long Term Goals - 09/30/20 1435       PT LONG TERM GOAL #1   Title Patient will improve FOTO score from 45% to 61%    Status On-going      PT LONG TERM GOAL #2   Title Pt will report no additional falls    Status On-going      PT  LONG TERM GOAL #3   Status On-going      PT LONG TERM GOAL #4   Title Pt will demo 5xSTS <20 sec with UEs pushing from thighs from standard height chair    Status On-going                   Plan - 09/30/20 1435     Clinical Impression Statement Again some quad activation achieved but  he is unable to extend L knee. Some stair negotiation with LLE controlling eccentric and concentric  phase of the movement, heavy compensation needed with CGA due to weakness. He reports no pain when trying to get the L knee to extend only weakness and difficulty achieving a forceful contraction.    Examination-Activity Limitations Stand;Stairs;Locomotion Level    Examination-Participation Restrictions Church;Community Activity;Interpersonal Relationship    Stability/Clinical Decision Making Evolving/Moderate complexity    Rehab Potential Good    PT Frequency 2x / week    PT Treatment/Interventions ADLs/Self Care Home Management;Electrical Stimulation;Neuromuscular re-education;Balance training;Therapeutic exercise;Therapeutic activities;Functional mobility training;Stair training;Gait training;Patient/family education;Manual techniques    PT Next Visit Plan quad strengthening, functional LE strengthening, stair progression,  high level balance ex's             Patient will benefit from skilled therapeutic intervention in order to improve the following deficits and impairments:  Abnormal gait, Decreased range of motion, Difficulty walking, Decreased endurance, Decreased activity tolerance, Decreased balance, Decreased mobility, Decreased strength, Impaired sensation  Visit Diagnosis: Repeated falls  Foot drop, left  Muscle weakness (generalized)  Sciatica, left side  Difficulty in walking, not elsewhere classified     Problem List Patient Active Problem List   Diagnosis Date Noted   Radiation-induced lumbosacral plexopathy 05/15/2020   Left foot drop 05/13/2020   Aortic atherosclerosis ([place]) by CT scan 2021 09/28/2019   Plantar fasciitis of left foot 09/21/2018   Gastroesophageal reflux disease without esophagitis 09/21/2018   Other abnormal glucose 02/13/2015   BMI 23.0-23.9, adult 02/13/2015   Pulmonary sarcoidosis (1988)  07/29/2014   Malignant neoplasm of prostate ([place]) 04/19/2014   Hyperlipidemia 07/13/2013   Vitamin D deficiency 07/13/2013   Medication management 07/13/2013   Essential hypertension 02/15/2007    Scot Jun, PTA 09/30/2020, 2:39 PM  [address] [place], [place], [address] Phone: [phone]   Fax:  [phone]  Name: WOODIE DEGRAFFENREID MRN: [id number] Date of Birth: 04/20/1933

## 2020-10-02 ENCOUNTER — Other Ambulatory Visit: Payer: Self-pay

## 2020-10-02 ENCOUNTER — Ambulatory Visit: Payer: Medicare HMO | Admitting: Physical Therapy

## 2020-10-02 ENCOUNTER — Encounter: Payer: Self-pay | Admitting: Physical Therapy

## 2020-10-02 DIAGNOSIS — M21372 Foot drop, left foot: Secondary | ICD-10-CM | POA: Diagnosis not present

## 2020-10-02 DIAGNOSIS — M6281 Muscle weakness (generalized): Secondary | ICD-10-CM

## 2020-10-02 DIAGNOSIS — M5432 Sciatica, left side: Secondary | ICD-10-CM

## 2020-10-02 DIAGNOSIS — R262 Difficulty in walking, not elsewhere classified: Secondary | ICD-10-CM

## 2020-10-02 DIAGNOSIS — R296 Repeated falls: Secondary | ICD-10-CM

## 2020-10-02 NOTE — Therapy (Signed)
[address]. [place], [place], [address] Phone: [phone]   Fax:  [phone]  Physical Therapy Treatment  Patient Details  Name: Matthew Aguirre MRN: [id number] Date of Birth: October 04, 1933 Referring Provider (PT): Dawley   Encounter Date: 10/02/2020   PT End of Session - 10/02/20 1603     Visit Number 7    Date for PT Re-Evaluation 12/04/20    PT Start Time 3818    PT Stop Time 1555    PT Time Calculation (min) 39 min    Activity Tolerance Patient tolerated treatment well    Behavior During Therapy [facility] for tasks assessed/performed             Past Medical History:  Diagnosis Date   Essential hypertension 02/15/2007   Hyperlipidemia 07/13/2013   Prediabetes 07/13/2013   Prostate cancer ([place])    Pulmonary nodule (2008 resolved on f/u)  02/15/2007   Vitamin D deficiency 07/13/2013    Past Surgical History:  Procedure Laterality Date   PROSTATE BIOPSY  2020   PROSTATE BIOPSY  2014   TONSILECTOMY/ADENOIDECTOMY WITH MYRINGOTOMY     TREATMENT FISTULA ANAL      There were no vitals filed for this visit.   Subjective Assessment - 10/02/20 1520     Subjective Pt reports he is shakey today and feeling tingling in his L LE.    Currently in Pain? Yes    Pain Score 2     Pain Location Back                               [facility] Adult PT Treatment/Exercise - 10/02/20 0001       Knee/Hip Exercises: Aerobic   Nustep L3 LE only x 5 min      Knee/Hip Exercises: Seated   Long Arc Quad Left;5 reps;3 sets   eccentric control     Knee/Hip Exercises: Supine   Quad Sets Left;2 sets;5 reps   in sitting   Short Arc Quad Sets Left;Strengthening;3 sets;5 reps    Short Arc Quad Sets Limitations eccentric control 2x5    Bridges Both;10 reps    Other Supine Knee/Hip Exercises clam red tband 2x10                      PT Short Term Goals - 09/11/20 1629       PT SHORT TERM GOAL #1    Title Pt will be I with initial HEP    Time 2    Period Weeks    Status New    Target Date 09/25/20               PT Long Term Goals - 09/30/20 1435       PT LONG TERM GOAL #1   Title Patient will improve FOTO score from 45% to 61%    Status On-going      PT LONG TERM GOAL #2   Title Pt will report no additional falls    Status On-going      PT LONG TERM GOAL #3   Status On-going      PT LONG TERM GOAL #4   Title Pt will demo 5xSTS <20 sec with UEs pushing from thighs from standard height chair    Status On-going                   Plan -  10/02/20 1600     Clinical Impression Statement Pt reports he has been shakey today but no falls reported, he is also having some back pain today. Some quad activation but still unable to extend to end range of L knee. Worked on eccentric control of LAQ and SAQ. Pt was able to hold SAQ for several seconds.    PT Treatment/Interventions ADLs/Self Care Home Management;Electrical Stimulation;Neuromuscular re-education;Balance training;Therapeutic exercise;Therapeutic activities;Functional mobility training;Stair training;Gait training;Patient/family education;Manual techniques    PT Next Visit Plan quad strengthening, functional LE strengthening, stair progression, high level balance ex's    PT Home Exercise Plan added supine QS and SAQ to HEP from last round of PT             Patient will benefit from skilled therapeutic intervention in order to improve the following deficits and impairments:  Abnormal gait, Decreased range of motion, Difficulty walking, Decreased endurance, Decreased activity tolerance, Decreased balance, Decreased mobility, Decreased strength, Impaired sensation  Visit Diagnosis: Repeated falls  Foot drop, left  Muscle weakness (generalized)  Sciatica, left side  Difficulty in walking, not elsewhere classified     Problem List Patient Active Problem List   Diagnosis Date Noted    Radiation-induced lumbosacral plexopathy 05/15/2020   Left foot drop 05/13/2020   Aortic atherosclerosis ([place]) by CT scan 2021 09/28/2019   Plantar fasciitis of left foot 09/21/2018   Gastroesophageal reflux disease without esophagitis 09/21/2018   Other abnormal glucose 02/13/2015   BMI 23.0-23.9, adult 02/13/2015   Pulmonary sarcoidosis (1988)  07/29/2014   Malignant neoplasm of prostate ([place]) 04/19/2014   Hyperlipidemia 07/13/2013   Vitamin D deficiency 07/13/2013   Medication management 07/13/2013   Essential hypertension 02/15/2007    Dawayne Cirri, SPTA 10/02/2020, 4:04 PM  [address]. [place], [place], [address] Phone: [phone]   Fax:  [phone]  Name: Matthew Aguirre MRN: [id number] Date of Birth: December 07, 1933

## 2020-10-07 ENCOUNTER — Ambulatory Visit: Payer: Medicare HMO | Admitting: Physical Therapy

## 2020-10-07 ENCOUNTER — Other Ambulatory Visit: Payer: Self-pay

## 2020-10-07 ENCOUNTER — Encounter: Payer: Self-pay | Admitting: Physical Therapy

## 2020-10-07 DIAGNOSIS — R262 Difficulty in walking, not elsewhere classified: Secondary | ICD-10-CM | POA: Diagnosis not present

## 2020-10-07 DIAGNOSIS — M6281 Muscle weakness (generalized): Secondary | ICD-10-CM

## 2020-10-07 DIAGNOSIS — R296 Repeated falls: Secondary | ICD-10-CM | POA: Diagnosis not present

## 2020-10-07 DIAGNOSIS — M21372 Foot drop, left foot: Secondary | ICD-10-CM

## 2020-10-07 DIAGNOSIS — M5432 Sciatica, left side: Secondary | ICD-10-CM | POA: Diagnosis not present

## 2020-10-07 NOTE — Therapy (Signed)
[address]. [place], [place], [address] Phone: [phone]   Fax:  [phone]  Physical Therapy Treatment  Patient Details  Name: Matthew Aguirre MRN: [id number] Date of Birth: 1933/11/30 Referring Provider (PT): Dawley   Encounter Date: 10/07/2020   PT End of Session - 10/07/20 1432     Visit Number 8    Date for PT Re-Evaluation 12/04/20    PT Start Time 1345    PT Stop Time 1430    PT Time Calculation (min) 45 min    Activity Tolerance Patient tolerated treatment well    Behavior During Therapy [facility] for tasks assessed/performed             Past Medical History:  Diagnosis Date   Essential hypertension 02/15/2007   Hyperlipidemia 07/13/2013   Prediabetes 07/13/2013   Prostate cancer ([place])    Pulmonary nodule (2008 resolved on f/u)  02/15/2007   Vitamin D deficiency 07/13/2013    Past Surgical History:  Procedure Laterality Date   PROSTATE BIOPSY  2020   PROSTATE BIOPSY  2014   TONSILECTOMY/ADENOIDECTOMY WITH MYRINGOTOMY     TREATMENT FISTULA ANAL      There were no vitals filed for this visit.   Subjective Assessment - 10/07/20 1349     Subjective doing ok today, pins and needles sensation in L foot, stiffness in lower back.    Currently in Pain? No/denies                [facility] PT Assessment - 10/07/20 0001       ROM / Strength   AROM / PROM / Strength AROM      AROM   AROM Assessment Site Knee    Right/Left Knee Right    Right Knee Extension 48                           [facility] Adult PT Treatment/Exercise - 10/07/20 0001       High Level Balance   High Level Balance Activities Side stepping;Backward walking   in parallel bars     Knee/Hip Exercises: Aerobic   Recumbent Bike L1.7 x  6 min      Knee/Hip Exercises: Machines for Strengthening   Total Gym Leg Press 20# 3x10      Knee/Hip Exercises: Standing   Other Standing Knee Exercises Stairs 4" x3    alternating step pattern     Knee/Hip Exercises: Seated   Long Arc Quad 10 reps;Strengthening;Right;2 sets      Knee/Hip Exercises: Supine   Quad Sets Left;2 sets;10 reps    Quad Sets Limitations red Tband                      PT Short Term Goals - 09/11/20 1629       PT SHORT TERM GOAL #1   Title Pt will be I with initial HEP    Time 2    Period Weeks    Status New    Target Date 09/25/20               PT Long Term Goals - 09/30/20 1435       PT LONG TERM GOAL #1   Title Patient will improve FOTO score from 45% to 61%    Status On-going      PT LONG TERM GOAL #2   Title Pt will report no additional falls  Status On-going      PT LONG TERM GOAL #3   Status On-going      PT LONG TERM GOAL #4   Title Pt will demo 5xSTS <20 sec with UEs pushing from thighs from standard height chair    Status On-going                   Plan - 10/07/20 1433     Clinical Impression Statement Some nervousness and with alt stairs and backwards walking. Pt continues to lack the ability to lock out his L knee under his on active quad contraction. Pt lacks 47 degrees of L knee extension under gravity, but has full passive L knee extension. He did better holding end range contraction with LAQ.    Examination-Activity Limitations Stand;Stairs;Locomotion Level    Examination-Participation Restrictions Church;Community Activity;Interpersonal Relationship    Stability/Clinical Decision Making Evolving/Moderate complexity    Rehab Potential Good    PT Frequency 2x / week    PT Duration 8 weeks    PT Next Visit Plan quad strengthening, functional LE strengthening, stair progression, high level balance ex's             Patient will benefit from skilled therapeutic intervention in order to improve the following deficits and impairments:  Abnormal gait, Decreased range of motion, Difficulty walking, Decreased endurance, Decreased activity tolerance, Decreased  balance, Decreased mobility, Decreased strength, Impaired sensation  Visit Diagnosis: Muscle weakness (generalized)  Foot drop, left  Difficulty in walking, not elsewhere classified  Repeated falls     Problem List Patient Active Problem List   Diagnosis Date Noted   Radiation-induced lumbosacral plexopathy 05/15/2020   Left foot drop 05/13/2020   Aortic atherosclerosis ([place]) by CT scan 2021 09/28/2019   Plantar fasciitis of left foot 09/21/2018   Gastroesophageal reflux disease without esophagitis 09/21/2018   Other abnormal glucose 02/13/2015   BMI 23.0-23.9, adult 02/13/2015   Pulmonary sarcoidosis (1988)  07/29/2014   Malignant neoplasm of prostate ([place]) 04/19/2014   Hyperlipidemia 07/13/2013   Vitamin D deficiency 07/13/2013   Medication management 07/13/2013   Essential hypertension 02/15/2007    Scot Jun, PTA 10/07/2020, 2:40 PM  [address]. [place], [place], [address] Phone: [phone]   Fax:  [phone]  Name: BRIDGET WOZNICK MRN: [id number] Date of Birth: 19-Aug-1933

## 2020-10-09 ENCOUNTER — Other Ambulatory Visit: Payer: Self-pay

## 2020-10-09 ENCOUNTER — Encounter: Payer: Self-pay | Admitting: Physical Therapy

## 2020-10-09 ENCOUNTER — Ambulatory Visit: Payer: Medicare HMO | Admitting: Physical Therapy

## 2020-10-09 DIAGNOSIS — M6281 Muscle weakness (generalized): Secondary | ICD-10-CM | POA: Diagnosis not present

## 2020-10-09 DIAGNOSIS — M5432 Sciatica, left side: Secondary | ICD-10-CM | POA: Diagnosis not present

## 2020-10-09 DIAGNOSIS — M21372 Foot drop, left foot: Secondary | ICD-10-CM | POA: Diagnosis not present

## 2020-10-09 DIAGNOSIS — R296 Repeated falls: Secondary | ICD-10-CM | POA: Diagnosis not present

## 2020-10-09 DIAGNOSIS — R262 Difficulty in walking, not elsewhere classified: Secondary | ICD-10-CM | POA: Diagnosis not present

## 2020-10-09 NOTE — Therapy (Signed)
[address]. [place], [place], [address] Phone: [phone]   Fax:  [phone]  Physical Therapy Treatment  Patient Details  Name: Matthew Aguirre MRN: [id number] Date of Birth: January 08, 1934 Referring Provider (PT): Dawley   Encounter Date: 10/09/2020   PT End of Session - 10/09/20 1425     Visit Number 9    Date for PT Re-Evaluation 12/04/20    PT Start Time [id number]    PT Stop Time 1427    PT Time Calculation (min) 42 min    Activity Tolerance Patient tolerated treatment well    Behavior During Therapy [facility] for tasks assessed/performed             Past Medical History:  Diagnosis Date   Essential hypertension 02/15/2007   Hyperlipidemia 07/13/2013   Prediabetes 07/13/2013   Prostate cancer ([place])    Pulmonary nodule (2008 resolved on f/u)  02/15/2007   Vitamin D deficiency 07/13/2013    Past Surgical History:  Procedure Laterality Date   PROSTATE BIOPSY  2020   PROSTATE BIOPSY  2014   TONSILECTOMY/ADENOIDECTOMY WITH MYRINGOTOMY     TREATMENT FISTULA ANAL      There were no vitals filed for this visit.   Subjective Assessment - 10/09/20 1347     Subjective "Doing ok, just tired"    Currently in Pain? No/denies                               [facility] Adult PT Treatment/Exercise - 10/09/20 0001       High Level Balance   High Level Balance Activities Side stepping      Knee/Hip Exercises: Aerobic   Nustep L4 x6 min      Knee/Hip Exercises: Machines for Strengthening   Total Gym Leg Press 40# 3x10      Knee/Hip Exercises: Standing   Other Standing Knee Exercises Alt 4in taps 2x5 w/ SPC      Knee/Hip Exercises: Seated   Long Arc Quad 10 reps;Strengthening;Right;2 sets   limited ROM   Long Arc Quad Weight 2 lbs.    Ball Squeeze 2x10''    Other Seated Knee/Hip Exercises Eccentric LAQ pt placed in full L knee ext asked to hold LE straight x5    Hamstring Curl Left;2 sets;10  reps    Hamstring Limitations green    Sit to Sand 2 sets;5 reps;without UE support   CGA to min assist     Knee/Hip Exercises: Supine   Quad Sets Left;2 sets;10 reps    Quad Sets Limitations green                      PT Short Term Goals - 09/11/20 1629       PT SHORT TERM GOAL #1   Title Pt will be I with initial HEP    Time 2    Period Weeks    Status New    Target Date 09/25/20               PT Long Term Goals - 09/30/20 1435       PT LONG TERM GOAL #1   Title Patient will improve FOTO score from 45% to 61%    Status On-going      PT LONG TERM GOAL #2   Title Pt will report no additional falls    Status On-going  PT LONG TERM GOAL #3   Status On-going      PT LONG TERM GOAL #4   Title Pt will demo 5xSTS <20 sec with UEs pushing from thighs from standard height chair    Status On-going                   Plan - 10/09/20 1426     Clinical Impression Statement More functional interventions today, some difficulty with S2S not using UE. Some hesitation and instability with alt box taps. Completed LAQ but with very limited ROM.  CGA needed with standing side steps. Cues to breath needed with eccentric LAQ    Examination-Activity Limitations Stand;Stairs;Locomotion Level    Examination-Participation Restrictions Church;Community Activity;Interpersonal Relationship    Stability/Clinical Decision Making Evolving/Moderate complexity    Rehab Potential Good    PT Frequency 2x / week    PT Duration 8 weeks    PT Treatment/Interventions ADLs/Self Care Home Management;Electrical Stimulation;Neuromuscular re-education;Balance training;Therapeutic exercise;Therapeutic activities;Functional mobility training;Stair training;Gait training;Patient/family education;Manual techniques    PT Next Visit Plan quad strengthening, functional LE strengthening, stair progression, high level balance ex's             Patient will benefit from skilled  therapeutic intervention in order to improve the following deficits and impairments:  Abnormal gait, Decreased range of motion, Difficulty walking, Decreased endurance, Decreased activity tolerance, Decreased balance, Decreased mobility, Decreased strength, Impaired sensation  Visit Diagnosis: Foot drop, left  Muscle weakness (generalized)     Problem List Patient Active Problem List   Diagnosis Date Noted   Radiation-induced lumbosacral plexopathy 05/15/2020   Left foot drop 05/13/2020   Aortic atherosclerosis ([place]) by CT scan 2021 09/28/2019   Plantar fasciitis of left foot 09/21/2018   Gastroesophageal reflux disease without esophagitis 09/21/2018   Other abnormal glucose 02/13/2015   BMI 23.0-23.9, adult 02/13/2015   Pulmonary sarcoidosis (1988)  07/29/2014   Malignant neoplasm of prostate ([place]) 04/19/2014   Hyperlipidemia 07/13/2013   Vitamin D deficiency 07/13/2013   Medication management 07/13/2013   Essential hypertension 02/15/2007    Scot Jun 10/09/2020, 2:30 PM  [place]. [place], [place], [address] Phone: [phone]   Fax:  [phone]  Name: Matthew Aguirre MRN: [id number] Date of Birth: [birth year]

## 2020-10-14 ENCOUNTER — Ambulatory Visit: Payer: Medicare HMO | Admitting: Adult Health

## 2020-10-15 ENCOUNTER — Ambulatory Visit: Payer: Medicare HMO | Attending: Neurological Surgery | Admitting: Physical Therapy

## 2020-10-15 ENCOUNTER — Other Ambulatory Visit: Payer: Self-pay

## 2020-10-15 ENCOUNTER — Encounter: Payer: Self-pay | Admitting: Physical Therapy

## 2020-10-15 DIAGNOSIS — R296 Repeated falls: Secondary | ICD-10-CM | POA: Insufficient documentation

## 2020-10-15 DIAGNOSIS — M6281 Muscle weakness (generalized): Secondary | ICD-10-CM | POA: Diagnosis not present

## 2020-10-15 DIAGNOSIS — R262 Difficulty in walking, not elsewhere classified: Secondary | ICD-10-CM

## 2020-10-15 DIAGNOSIS — M21372 Foot drop, left foot: Secondary | ICD-10-CM | POA: Insufficient documentation

## 2020-10-15 NOTE — Therapy (Signed)
[facility]. [place], [place], [address] Phone: [phone]   Fax:  [phone] Progress Note Reporting Period 09/11/20 to 10/15/20 for the first 10 visits  See note below for Objective Data and Assessment of Progress/Goals.     Physical Therapy Treatment  Patient Details  Name: Matthew Aguirre MRN: [id number] Date of Birth: April 28, 1933 Referring Provider (PT): Dawley   Encounter Date: 10/15/2020   PT End of Session - 10/15/20 1557     Visit Number 10    Date for PT Re-Evaluation 12/04/20    PT Start Time 7510    PT Stop Time 2585    PT Time Calculation (min) 43 min    Activity Tolerance Patient tolerated treatment well    Behavior During Therapy [facility] for tasks assessed/performed             Past Medical History:  Diagnosis Date   Essential hypertension 02/15/2007   Hyperlipidemia 07/13/2013   Prediabetes 07/13/2013   Prostate cancer ([place])    Pulmonary nodule (2008 resolved on f/u)  02/15/2007   Vitamin D deficiency 07/13/2013    Past Surgical History:  Procedure Laterality Date   PROSTATE BIOPSY  2020   PROSTATE BIOPSY  2014   TONSILECTOMY/ADENOIDECTOMY WITH MYRINGOTOMY     TREATMENT FISTULA ANAL      There were no vitals filed for this visit.   Subjective Assessment - 10/15/20 1517     Subjective "Tired, back is stiff"    Currently in Pain? No/denies                [facility] PT Assessment - 10/15/20 0001       AROM   AROM Assessment Site Knee    Right/Left Knee Right    Right Knee Extension 46      Strength   Left Knee Extension 3-/5                           [facility] Adult PT Treatment/Exercise - 10/15/20 0001       Knee/Hip Exercises: Aerobic   Recumbent Bike L2 x 6 min    Nustep L2 LE only x 2 min      Knee/Hip Exercises: Machines for Engineering geologist presses 1 blue Green TKE 2x5      Knee/Hip Exercises: Seated   Long Arc Quad 10  reps;Strengthening;Right;2 sets    Other Seated Knee/Hip Exercises Eccentric LAQ pt placed in full L knee ext asked to hold LE straight 2x5    Other Seated Knee/Hip Exercises Quad set green 2x10      Knee/Hip Exercises: Supine   Short Arc Quad Sets Left;Strengthening;3 sets;5 reps   assist needed   Straight Leg Raises Strengthening;Left;5 reps;3 sets   eccentrics                     PT Short Term Goals - 09/11/20 1629       PT SHORT TERM GOAL #1   Title Pt will be I with initial HEP    Time 2    Period Weeks    Status New    Target Date 09/25/20               PT Long Term Goals - 10/15/20 1559       PT LONG TERM GOAL #1   Title Patient will improve FOTO score from 45% to 61%  Status On-going      PT LONG TERM GOAL #2   Title Pt will report no additional falls    Status Partially Met      PT LONG TERM GOAL #3   Title Pt will report able to return to gym for independent maintenance program    Status On-going      PT LONG TERM GOAL #4   Title Pt will demo 5xSTS <20 sec with UEs pushing from thighs from standard height chair    Status On-going                   Plan - 10/15/20 1600     Clinical Impression Statement Pt continues to have L quad weakness, and decrease AROM against gravity. All LAQ, SLR, and SAQ needed assist to complete, pt was cued to hold during the eccentric phase of each interventions. Some end range L quad contraction felt with quad sets against green Tband.    Examination-Activity Limitations Stand;Stairs;Locomotion Level    Examination-Participation Restrictions Church;Community Activity;Interpersonal Relationship    Stability/Clinical Decision Making Evolving/Moderate complexity    Rehab Potential Good    PT Frequency 2x / week    PT Duration 8 weeks    PT Treatment/Interventions ADLs/Self Care Home Management;Electrical Stimulation;Neuromuscular re-education;Balance training;Therapeutic exercise;Therapeutic  activities;Functional mobility training;Stair training;Gait training;Patient/family education;Manual techniques    PT Next Visit Plan quad strengthening, functional LE strengthening, stair progression, high level balance ex's, Possible D/C next week             Patient will benefit from skilled therapeutic intervention in order to improve the following deficits and impairments:  Abnormal gait, Decreased range of motion, Difficulty walking, Decreased endurance, Decreased activity tolerance, Decreased balance, Decreased mobility, Decreased strength, Impaired sensation  Visit Diagnosis: No diagnosis found.     Problem List Patient Active Problem List   Diagnosis Date Noted   Radiation-induced lumbosacral plexopathy 05/15/2020   Left foot drop 05/13/2020   Aortic atherosclerosis ([place]) by CT scan 2021 09/28/2019   Plantar fasciitis of left foot 09/21/2018   Gastroesophageal reflux disease without esophagitis 09/21/2018   Other abnormal glucose 02/13/2015   BMI 23.0-23.9, adult 02/13/2015   Pulmonary sarcoidosis (1988)  07/29/2014   Malignant neoplasm of prostate ([place]) 04/19/2014   Hyperlipidemia 07/13/2013   Vitamin D deficiency 07/13/2013   Medication management 07/13/2013   Essential hypertension 02/15/2007    Scot Jun 10/15/2020, 4:03 PM  [facility]. [place], [place], [address] Phone: [phone]   Fax:  [phone]  Name: PERRIN GENS MRN: [id number] Date of Birth: 08-May-1933

## 2020-10-16 DIAGNOSIS — C61 Malignant neoplasm of prostate: Secondary | ICD-10-CM | POA: Diagnosis not present

## 2020-10-17 ENCOUNTER — Ambulatory Visit: Payer: Medicare HMO | Admitting: Physical Therapy

## 2020-10-17 ENCOUNTER — Encounter: Payer: Self-pay | Admitting: Physical Therapy

## 2020-10-17 ENCOUNTER — Other Ambulatory Visit: Payer: Self-pay

## 2020-10-17 DIAGNOSIS — M21372 Foot drop, left foot: Secondary | ICD-10-CM | POA: Diagnosis not present

## 2020-10-17 DIAGNOSIS — M6281 Muscle weakness (generalized): Secondary | ICD-10-CM | POA: Diagnosis not present

## 2020-10-17 DIAGNOSIS — R262 Difficulty in walking, not elsewhere classified: Secondary | ICD-10-CM

## 2020-10-17 DIAGNOSIS — R296 Repeated falls: Secondary | ICD-10-CM | POA: Diagnosis not present

## 2020-10-17 NOTE — Therapy (Signed)
[facility]. [place], [place], [address] Phone: [phone]   Fax:  [phone]  Physical Therapy Treatment  Patient Details  Name: Matthew Aguirre MRN: [id number] Date of Birth: 1933/10/22 Referring Provider (PT): Dawley   Encounter Date: 10/17/2020   PT End of Session - 10/17/20 1556     Visit Number 11    Date for PT Re-Evaluation 12/04/20    PT Start Time 3846    PT Stop Time 6599    PT Time Calculation (min) 41 min    Activity Tolerance Patient tolerated treatment well    Behavior During Therapy [facility] for tasks assessed/performed             Past Medical History:  Diagnosis Date   Essential hypertension 02/15/2007   Hyperlipidemia 07/13/2013   Prediabetes 07/13/2013   Prostate cancer ([place])    Pulmonary nodule (2008 resolved on f/u)  02/15/2007   Vitamin D deficiency 07/13/2013    Past Surgical History:  Procedure Laterality Date   PROSTATE BIOPSY  2020   PROSTATE BIOPSY  2014   TONSILECTOMY/ADENOIDECTOMY WITH MYRINGOTOMY     TREATMENT FISTULA ANAL      There were no vitals filed for this visit.   Subjective Assessment - 10/17/20 1517     Subjective Back is a little stiff, pain when he moves a certain way    Currently in Pain? No/denies                               [facility] Adult PT Treatment/Exercise - 10/17/20 0001       Knee/Hip Exercises: Aerobic   Nustep L5 x 7 min      Knee/Hip Exercises: Machines for Strengthening   Total Gym Leg Press 40# 2x10 LLE red TKE, LLE TKE 20lb 3x5      Knee/Hip Exercises: Standing   Other Standing Knee Exercises Standing marches CGA 2x10      Knee/Hip Exercises: Seated   Long Arc Quad 10 reps;Strengthening;Right;2 sets    Other Seated Knee/Hip Exercises Eccentric LAQ pt placed in full L knee ext asked to hold LE straight 2x5    Other Seated Knee/Hip Exercises Quad set green 2x10    Hamstring Curl Left;2 sets;10 reps    Hamstring  Limitations green    Sit to Sand 1 set;10 reps;with UE support                      PT Short Term Goals - 09/11/20 1629       PT SHORT TERM GOAL #1   Title Pt will be I with initial HEP    Time 2    Period Weeks    Status New    Target Date 09/25/20               PT Long Term Goals - 10/15/20 1559       PT LONG TERM GOAL #1   Title Patient will improve FOTO score from 45% to 61%    Status On-going      PT LONG TERM GOAL #2   Title Pt will report no additional falls    Status Partially Met      PT LONG TERM GOAL #3   Title Pt will report able to return to gym for independent maintenance program    Status On-going      PT LONG TERM GOAL #  4   Title Pt will demo 5xSTS <20 sec with UEs pushing from thighs from standard height chair    Status On-going                   Plan - 10/17/20 1557     Clinical Impression Statement Pt has two more apt's scheduled, L quad weakness and decrease AROM remains in regards to leg extensions. Some LOB noted with standing marches today. Some assist needed with single LE TKE on leg press. Eccentric long arc quads remains very difficult.    Examination-Activity Limitations Stand;Stairs;Locomotion Level    Examination-Participation Restrictions Church;Community Activity;Interpersonal Relationship    Stability/Clinical Decision Making Evolving/Moderate complexity    Rehab Potential Good    PT Frequency 2x / week    PT Duration 8 weeks    PT Treatment/Interventions ADLs/Self Care Home Management;Electrical Stimulation;Neuromuscular re-education;Balance training;Therapeutic exercise;Therapeutic activities;Functional mobility training;Stair training;Gait training;Patient/family education;Manual techniques    PT Next Visit Plan quad strengthening, functional LE strengthening, stair progression, high level balance ex's, Possible D/C next week             Patient will benefit from skilled therapeutic intervention in  order to improve the following deficits and impairments:  Abnormal gait, Decreased range of motion, Difficulty walking, Decreased endurance, Decreased activity tolerance, Decreased balance, Decreased mobility, Decreased strength, Impaired sensation  Visit Diagnosis: Repeated falls  Difficulty in walking, not elsewhere classified  Muscle weakness (generalized)     Problem List Patient Active Problem List   Diagnosis Date Noted   Radiation-induced lumbosacral plexopathy 05/15/2020   Left foot drop 05/13/2020   Aortic atherosclerosis ([place]) by CT scan 2021 09/28/2019   Plantar fasciitis of left foot 09/21/2018   Gastroesophageal reflux disease without esophagitis 09/21/2018   Other abnormal glucose 02/13/2015   BMI 23.0-23.9, adult 02/13/2015   Pulmonary sarcoidosis (1988)  07/29/2014   Malignant neoplasm of prostate ([place]) 04/19/2014   Hyperlipidemia 07/13/2013   Vitamin D deficiency 07/13/2013   Medication management 07/13/2013   Essential hypertension 02/15/2007    Scot Jun, PTA 10/17/2020, 4:00 PM  [facility]. [place], [place], [address] Phone: [phone]   Fax:  [phone]  Name: Matthew Aguirre MRN: [id number] Date of Birth: [birth year]

## 2020-10-21 ENCOUNTER — Ambulatory Visit: Payer: Medicare HMO | Admitting: Physical Therapy

## 2020-10-23 DIAGNOSIS — C61 Malignant neoplasm of prostate: Secondary | ICD-10-CM | POA: Diagnosis not present

## 2020-10-24 ENCOUNTER — Ambulatory Visit: Payer: Medicare HMO | Admitting: Physical Therapy

## 2020-11-22 NOTE — Progress Notes (Signed)
MEDICARE ANNUAL WELLNESS VISIT AND FOLLOW UP Assessment:   Diagnoses and all orders for this visit:  Annual Medicare Wellness Visit Due annually  Health maintenance reviewed  Aortic atherosclerosis ([place]) Per CT 03/2019 Control blood pressure, cholesterol, glucose, increase exercise.   Essential hypertension Persistently elevated today with recheck  Continue medication, good control on home logs Possible white coat  Will have him return for NV BP recheck in 1 month, check twice daily rotating times, bring BP cuff from home to compare Monitor blood pressure at home; call if consistently over 130/80 Continue DASH diet.   Reminder to go to the ER if any CP, SOB, nausea, dizziness, severe HA, changes vision/speech, left arm numbness and tingling and jaw pain.  Pulmonary sarcoidosis (1988)  Followed by pulmonology PRN  Prostate cancer [facility])  Managed by Dr. Junious Silk, s/p radiation, completed eligard x 18 months Plans to follow up 6 months  Vitamin D deficiency At goal at last check; continue to recommend supplementation for goal of 60-100 Defer vitamin D level to CPE  Other abnormal glucose Recent A1Cs at goal Discussed diet/exercise, weight management  Defer A1C; check BMP  Medication management CBC, CMP/GFR  Hyperlipidemia No longer treated due to age Mild elevations monitored Continue cholesterol diet/exercise Check lipid panel   BMI 24 Continue to recommend diet heavy in fruits and veggies and low in animal meats, cheeses, and dairy products, appropriate calorie intake Discuss exercise recommendations routinely Continue to monitor weight at each visit  left foot drop/ moderate fall risk Continue ankle brace, cane/walker,  Has completed PT and had home safety check, continue home exercises  Mild depression ([place]) Start new medication as prescribed - wellbutrin XR 150 mg may also help with energy Stress management techniques discussed, increase water, good sleep  hygiene discussed, increase exercise, and increase veggies.  Follow up 1 month, call the office if any new AE's from medications and we will switch them  Orders Placed This Encounter  Procedures   CBC with Differential/Platelet   COMPLETE METABOLIC PANEL WITH GFR   Magnesium   Lipid panel   TSH    Over 30 minutes of exam, counseling, chart review, and critical decision making was performed  Future Appointments  Date Time Provider [place]  04/08/2021  2:00 PM Unk Pinto, MD [facility] None  11/26/2021 11:30 AM Liane Comber, NP [facility] None      Plan:   During the course of the visit the patient was educated and counseled about appropriate screening and preventive services including:   Pneumococcal vaccine  Influenza vaccine Prevnar 13 Td vaccine Screening electrocardiogram Colorectal cancer screening Diabetes screening Glaucoma screening Nutrition counseling    Subjective:  OLUWADEMILADE Aguirre is a 87 y.o. male who presents for Medicare Annual Wellness Visit and 3 month follow up for HTN, hyperlipidemia, glucose management, and vitamin D Def.   Patient has persistent left food drop with falls; had MRI and saw [facility], Dr. Reatha Armour initiated on PT, felt possible radiation-induced lumbosacral plexopathy. Continues with home exercises. Also ankle brace device for foot drop. He is taking gabapentin 100 mg only at night. He reports doing well. Pain fully resolved. He uses cane/rolling walker. Had home safety check, no steps, has walk in shower and hand rails.   Patient has hx/o Prostate Cancer (2014 ) followed by Dr Junious Silk, had PSA jump with recurrence, initiated on Eligard in Feb 2021, had prostate markers placed 4/6 and underwent external radiation, 40 treatments. Recently complted eliguard and notes  still has fatigue but has noted mild improvement.   However, he scores 8 on PHQ-9 today, admits depressed mood x 12+ months, cries  frequently in the afternoon, also tends to have energy drop about this time. Interested in trying mediation for mood-   BMI is Body mass index is 24.21 kg/m., he has been working on diet and exercise, does leg strengthening exercises daily 20-30 min. Church delivers meals twice weekly.  Wt Readings from Last 3 Encounters:  11/26/20 150 lb (68 kg)  08/19/20 143 lb 6.4 oz (65 kg)  07/30/20 147 lb (66.7 kg)   Ct abd/pelvis in 03/2019 showed aortic atherosclerosis His blood pressure has been controlled at home (110-130s/60), today their BP is BP: (!) 156/84 He does workout. He denies chest pain, shortness of breath, dizziness.   He is not on cholesterol medication secondary to age. Hx of intolerance of RYRS, welcol, zetia, pravastatin. Currently taking fish oil. His cholesterol is not at goal. The cholesterol last visit was:   Lab Results  Component Value Date   CHOL 184 08/19/2020   HDL 47 08/19/2020   LDLCALC 111 (H) 08/19/2020   TRIG 146 08/19/2020   CHOLHDL 3.9 08/19/2020   He has been working on diet and exercise for glucose management, and denies foot ulcerations, increased appetite, nausea, paresthesia of the feet, polydipsia, polyuria, visual disturbances, vomiting and weight loss. Last A1C in the office was:  Lab Results  Component Value Date   HGBA1C 5.8 (H) 08/19/2020   Last GFR Lab Results  Component Value Date   GFRNONAA 68 08/19/2020    Patient is on Vitamin D supplement and at goal:    Lab Results  Component Value Date   VD25OH 88 08/19/2020        Medication Review:   Current Outpatient Medications (Cardiovascular):    metoprolol tartrate (LOPRESSOR) 50 MG tablet, Take 1 tablet (50 mg total) by mouth 2 (two) times daily.   Current Outpatient Medications (Analgesics):    acetaminophen (TYLENOL) 500 MG tablet, Take 500 mg by mouth as needed (prn).    aspirin 81 MG EC tablet, Take by mouth.  Current Outpatient Medications (Hematological):    Cyanocobalamin  (VITAMIN B12 PO), Take by mouth daily.  Current Outpatient Medications (Other):    buPROPion (WELLBUTRIN XL) 150 MG 24 hr tablet, Take 1 tab daily in the morning for mood.   Calcium Carbonate (CALCIUM 600 PO), Take by mouth daily.   Cholecalciferol (VITAMIN D PO), Take 2,000 Int'l Units by mouth 2 (two) times daily.    Flaxseed, Linseed, (FLAX SEED OIL PO), Take 1,200 mg by mouth 3 (three) times daily.    gabapentin (NEURONTIN) 100 MG capsule, Take 1 to 3 capsules at Bedtime for sciatica Pain   Magnesium 250 MG TABS, Take 250 mg by mouth daily.   Omega-3 Fatty Acids (FISH OIL PO), Take 1,200 mg by mouth 2 (two) times daily.    pantoprazole (PROTONIX) 40 MG tablet, Take  1 tablet  Daily  to Prevent Heartburn & Indigestion  Allergies: Allergies  Allergen Reactions   Pravastatin    Red Yeast Rice [Cholestin]    Sulfa Antibiotics    Vibramycin [Doxycycline Calcium]    Zetia [Ezetimibe]     Current Problems (verified) has Essential hypertension; Hyperlipidemia; Vitamin D deficiency; Medication management; Malignant neoplasm of prostate ([place]); Pulmonary sarcoidosis (1988) ; Other abnormal glucose; BMI 23.0-23.9, adult; Plantar fasciitis of left foot; Gastroesophageal reflux disease without esophagitis; Aortic atherosclerosis ([place]) by CT scan 2021;  Left foot drop; Radiation-induced lumbosacral plexopathy; and Mild depression ([place]) on their problem list.  Screening Tests Immunization History  Administered Date(s) Administered   DT (Pediatric) 05/17/2015   Influenza, High Dose Seasonal PF 01/23/2014, 02/13/2015, 11/27/2015, 12/01/2016, 12/01/2017, 01/05/2019, 01/29/2020   Moderna SARS-COV2 Booster Vaccination 02/27/2020   Moderna Sars-Covid-2 Vaccination 05/01/2019, 05/30/2019   Pneumococcal Conjugate-13 10/12/2013   Pneumococcal Polysaccharide-23 05/17/2015   Zoster Recombinat (Shingrix) 08/13/2017, 10/14/2017    Preventative care: Last colonoscopy: 2014, declines further  screenings Cologuard: 11/2017, neg  CT abd/pelvis 03/2019 - aortic atherosclerosis, enlarged prostate, bil inguinal hernia (denies sx or surgical referral)  Prior vaccinations: TD or Tdap: 2017  Influenza: 12/2019 - will schedule NV in 2-4 weeks  Pneumococcal:2017 Prevnar13: 2015 Shingles/Zostavax: 2019 Covid 19: 2/2, 2021, moderna  Names of Other Physician/Practitioners you currently use: 1. [facility] here for primary care 2. Dr. Gershon Crane, eye doctor, last visit 2022, glasses  3. Dr. Baird Cancer, dentist, last visit 2022, goes q70m Patient Care Team: MUnk Pinto MD as PCP - General (Internal Medicine) EFestus Aloe MD as Consulting Physician (Urology) KInda Castle MD (Inactive) as Consulting Physician (Gastroenterology) SRutherford Guys MD as Consulting Physician (Ophthalmology)  Surgical: He  has a past surgical history that includes Treatment fistula anal; Tonsilectomy/adenoidectomy with myringotomy; Prostate biopsy (2020); and Prostate biopsy (2014). Family His family history includes Hyperlipidemia in his brother; Hypertension in his brother. Social history  He reports that he has never smoked. He has never used smokeless tobacco. He reports that he does not drink alcohol and does not use drugs.  MEDICARE WELLNESS OBJECTIVES: Physical activity: Current Exercise Habits: Home exercise routine, Type of exercise: stretching;strength training/weights, Time (Minutes): 30, Frequency (Times/Week): 7, Weekly Exercise (Minutes/Week): 210, Intensity: Mild, Exercise limited by: neurologic condition(s) Cardiac risk factors: Cardiac Risk Factors include: advanced age (>558m, >6>69omen);dyslipidemia;hypertension;male gender Depression/mood screen:   [facility] 11/26/2020  Decreased Interest 2  Down, Depressed, Hopeless 3  PHQ - 2 Score 5  Altered sleeping 0  Tired, decreased energy 3  Change in appetite 0  Feeling bad or  failure about yourself  0  Trouble concentrating 0  Moving slowly or fidgety/restless 0  Suicidal thoughts 0  PHQ-9 Score 8  Difficult doing work/chores Somewhat difficult    ADLs:  In your present state of health, do you have any difficulty performing the following activities: 11/26/2020 08/19/2020  Hearing? N N  Vision? N N  Difficulty concentrating or making decisions? N N  Walking or climbing stairs? Y N  Comment drop foot, completed PT, walks with cane and walker -  Dressing or bathing? N N  Doing errands, shopping? N N  Preparing Food and eating ? N -  Using the Toilet? N -  In the past six months, have you accidently leaked urine? N -  Do you have problems with loss of bowel control? N -  Managing your Medications? N -  Managing your Finances? N -  Housekeeping or managing your Housekeeping? N -  Some recent data might be hidden     Cognitive Testing  Alert? Yes  Normal Appearance?Yes  Oriented to person? Yes  Place? Yes   Time? Yes  Recall of three objects?  Yes  Can perform simple calculations? Yes  Displays appropriate judgment?Yes  Can read the correct time from a watch face?Yes  EOL planning: Does Patient Have a Medical Advance Directive?: Yes Type of Advance Directive: Healthcare Power of [place] will Does  patient want to make changes to medical advance directive?: No - Patient declined Copy of [place] in Chart?: Yes - validated most recent copy scanned in chart (See row information)   Objective:   Today's Vitals   11/26/20 1142 11/26/20 1232  BP: (!) 160/74 (!) 156/84  Pulse: 88   Temp: (!) 97.5 F (36.4 C)   SpO2: 99%   Weight: 150 lb (68 kg)     Body mass index is 24.21 kg/m.  General appearance: alert, no distress, WD/WN, male HEENT: normocephalic, sclerae anicteric, TMs pearly, nares patent, no discharge or erythema, pharynx normal Oral cavity: MMM, no lesions Neck: supple, no lymphadenopathy, no thyromegaly, no  masses Heart: RRR, normal S1, S2, no murmurs Lungs: CTA bilaterally, no wheezes, rhonchi, or rales Abdomen: +bs, soft, non tender, non distended, no masses, no hepatomegaly, no splenomegaly Musculoskeletal: nontender, no swelling, no obvious deformity. He has mild left heel anterior/medial tenderness without palpable abnormality.  Extremities: no edema, no cyanosis, no clubbing Pulses: 2+ symmetric, upper and lower extremities, normal cap refill Neurological: alert, oriented x 3, CN2-12 intact, strength normal upper extremities and lower extremities excepting left foot flexion, sensation diminished to left lower leg and ankle, DTRs 2+ upper extremities and patellar, no cerebellar signs, mildlly antalgic gait with cane.  Psychiatric: normal affect, behavior normal, pleasant   Medicare Attestation I have personally reviewed: The patient's medical and social history Their use of alcohol, tobacco or illicit drugs Their current medications and supplements The patient's functional ability including ADLs,fall risks, home safety risks, cognitive, and hearing and visual impairment Diet and physical activities Evidence for depression or mood disorders  The patient's weight, height, BMI, and visual acuity have been recorded in the chart.  I have made referrals, counseling, and provided education to the patient based on review of the above and I have provided the patient with a written personalized care plan for preventive services.     Izora Ribas, NP   11/26/2020

## 2020-11-25 ENCOUNTER — Ambulatory Visit: Payer: Medicare HMO | Admitting: Adult Health

## 2020-11-26 ENCOUNTER — Other Ambulatory Visit: Payer: Self-pay

## 2020-11-26 ENCOUNTER — Ambulatory Visit (INDEPENDENT_AMBULATORY_CARE_PROVIDER_SITE_OTHER): Payer: Medicare HMO | Admitting: Adult Health

## 2020-11-26 ENCOUNTER — Encounter: Payer: Self-pay | Admitting: Adult Health

## 2020-11-26 VITALS — BP 156/84 | HR 88 | Temp 97.5°F | Wt 150.0 lb

## 2020-11-26 DIAGNOSIS — R6889 Other general symptoms and signs: Secondary | ICD-10-CM | POA: Diagnosis not present

## 2020-11-26 DIAGNOSIS — R7309 Other abnormal glucose: Secondary | ICD-10-CM

## 2020-11-26 DIAGNOSIS — G541 Lumbosacral plexus disorders: Secondary | ICD-10-CM | POA: Diagnosis not present

## 2020-11-26 DIAGNOSIS — E782 Mixed hyperlipidemia: Secondary | ICD-10-CM

## 2020-11-26 DIAGNOSIS — Z0001 Encounter for general adult medical examination with abnormal findings: Secondary | ICD-10-CM

## 2020-11-26 DIAGNOSIS — I10 Essential (primary) hypertension: Secondary | ICD-10-CM

## 2020-11-26 DIAGNOSIS — D860 Sarcoidosis of lung: Secondary | ICD-10-CM | POA: Diagnosis not present

## 2020-11-26 DIAGNOSIS — I700 Atherosclerosis of aorta: Secondary | ICD-10-CM

## 2020-11-26 DIAGNOSIS — F320 Major depressive disorder, single episode, mild: Secondary | ICD-10-CM

## 2020-11-26 DIAGNOSIS — Z6823 Body mass index (BMI) 23.0-23.9, adult: Secondary | ICD-10-CM | POA: Diagnosis not present

## 2020-11-26 DIAGNOSIS — Z0000 Encounter for general adult medical examination without abnormal findings: Secondary | ICD-10-CM

## 2020-11-26 DIAGNOSIS — F32A Depression, unspecified: Secondary | ICD-10-CM | POA: Insufficient documentation

## 2020-11-26 DIAGNOSIS — Z79899 Other long term (current) drug therapy: Secondary | ICD-10-CM

## 2020-11-26 DIAGNOSIS — R69 Illness, unspecified: Secondary | ICD-10-CM | POA: Diagnosis not present

## 2020-11-26 DIAGNOSIS — E559 Vitamin D deficiency, unspecified: Secondary | ICD-10-CM

## 2020-11-26 DIAGNOSIS — C61 Malignant neoplasm of prostate: Secondary | ICD-10-CM | POA: Diagnosis not present

## 2020-11-26 DIAGNOSIS — K219 Gastro-esophageal reflux disease without esophagitis: Secondary | ICD-10-CM | POA: Diagnosis not present

## 2020-11-26 MED ORDER — BUPROPION HCL ER (XL) 150 MG PO TB24
ORAL_TABLET | ORAL | 0 refills | Status: DC
Start: 2020-11-26 — End: 2021-07-29

## 2020-11-26 NOTE — Patient Instructions (Addendum)
Goals      Blood Pressure < 140/80     Exercise 150 min/wk Moderate Activity     LDL CALC < 100         Recommend checking blood pressure prior to taking med, rotate checking different times of the day, if much lower in afternoon please schedule nurse visit in the afternoon.   Please let me know in 1 month how your mood is doing with wellbutrin   Bupropion Extended-Release Tablets (Depression/Mood Disorders) What is this medication? BUPROPION (byoo PROE pee on) treats depression. It increases norepinephrine and dopamine in the brain, hormones that help regulate mood. It belongs to a group of medications called NDRIs. This medicine may be used for other purposes; ask your health care provider or pharmacist if you have questions. COMMON BRAND NAME(S): Aplenzin, Budeprion XL, Forfivo XL, Wellbutrin XL What should I tell my care team before I take this medication? They need to know if you have any of these conditions: An eating disorder, such as anorexia or bulimia Bipolar disorder or psychosis Diabetes or high blood sugar, treated with medication Glaucoma Head injury or brain tumor Heart disease, previous heart attack, or irregular heart beat High blood pressure Kidney or liver disease Seizures (convulsions) Suicidal thoughts or a previous suicide attempt Tourette's syndrome Weight loss An unusual or allergic reaction to bupropion, other medications, foods, dyes, or preservatives Pregnant or trying to become pregnant Breast-feeding How should I use this medication? Take this medication by mouth with a glass of water. Follow the directions on the prescription label. You can take it with or without food. If it upsets your stomach, take it with food. Do not crush, chew, or cut these tablets. This medication is taken once daily at the same time each day. Do not take your medication more often than directed. Do not stop taking this medication suddenly except upon the advice of your  care team. Stopping this medication too quickly may cause serious side effects or your condition may worsen. A special MedGuide will be given to you by the pharmacist with each prescription and refill. Be sure to read this information carefully each time. Talk to your care team about the use of this medication in children. Special care may be needed. Overdosage: If you think you have taken too much of this medicine contact a poison control center or emergency room at once. NOTE: This medicine is only for you. Do not share this medicine with others. What if I miss a dose? If you miss a dose, skip the missed dose and take your next tablet at the regular time. Do not take double or extra doses. What may interact with this medication? Do not take this medication with any of the following: Linezolid MAOIs like Azilect, Carbex, Eldepryl, Marplan, Nardil, and Parnate Methylene blue (injected into a vein) Other medications that contain bupropion like Zyban This medication may also interact with the following: Alcohol Certain medications for anxiety or sleep Certain medications for blood pressure like metoprolol, propranolol Certain medications for depression or psychotic disturbances Certain medications for HIV or AIDS like efavirenz, lopinavir, nelfinavir, ritonavir Certain medications for irregular heart beat like propafenone, flecainide Certain medications for Parkinson's disease like amantadine, levodopa Certain medications for seizures like carbamazepine, phenytoin, phenobarbital Cimetidine Clopidogrel Cyclophosphamide Digoxin Furazolidone Isoniazid Nicotine Orphenadrine Procarbazine Steroid medications like prednisone or cortisone Stimulant medications for attention disorders, weight loss, or to stay awake Tamoxifen Theophylline Thiotepa Ticlopidine Tramadol Warfarin This list may not describe all possible interactions.  Give your health care provider a list of all the medicines,  herbs, non-prescription drugs, or dietary supplements you use. Also tell them if you smoke, drink alcohol, or use illegal drugs. Some items may interact with your medicine. What should I watch for while using this medication? Tell your care team if your symptoms do not get better or if they get worse. Visit your care team for regular checks on your progress. Because it may take several weeks to see the full effects of this medication, it is important to continue your treatment as prescribed. Watch for new or worsening thoughts of suicide or depression. This includes sudden changes in mood, behavior, or thoughts. These changes can happen at any time but are more common in the beginning of treatment or after a change in dose. Call your care team right away if you experience these thoughts or worsening depression. Manic episodes may happen in patients with bipolar disorder who take this medication. Watch for changes in feelings or behaviors such as feeling anxious, nervous, agitated, panicky, irritable, hostile, aggressive, impulsive, severely restless, overly excited and hyperactive, or trouble sleeping. These symptoms can happen at anytime but are more common in the beginning of treatment or after a change in dose. Call your care team right away if you notice any of these symptoms. This medication may cause serious skin reactions. They can happen weeks to months after starting the medication. Contact your care team right away if you notice fevers or flu-like symptoms with a rash. The rash may be red or purple and then turn into blisters or peeling of the skin. Or, you might notice a red rash with swelling of the face, lips or lymph nodes in your neck or under your arms. Avoid drinks that contain alcohol while taking this medication. Drinking large amounts of alcohol, using sleeping or anxiety medications, or quickly stopping the use of these agents while taking this medication may increase your risk for a  seizure. Do not drive or use heavy machinery until you know how this medication affects you. This medication can impair your ability to perform these tasks. Do not take this medication close to bedtime. It may prevent you from sleeping. Your mouth may get dry. Chewing sugarless gum or sucking hard candy, and drinking plenty of water may help. Contact your care team if the problem does not go away or is severe. The tablet shell for some brands of this medication does not dissolve. This is normal. The tablet shell may appear whole in the stool. This is not a cause for concern. What side effects may I notice from receiving this medication? Side effects that you should report to your care team as soon as possible: Allergic reactions-skin rash, itching, hives, swelling of the face, lips, tongue, or throat Increase in blood pressure Mood and behavior changes-anxiety, nervousness, confusion, hallucinations, irritability, hostility, thoughts of suicide or self-harm, worsening mood, feelings of depression Redness, blistering, peeling, or loosening of the skin, including inside the mouth Seizures Sudden eye pain or change in vision such as blurry vision, seeing halos around lights, vision loss Side effects that usually do not require medical attention (report to your care team if they continue or are bothersome): Constipation Dizziness Dry mouth Loss of appetite Nausea Tremors or shaking Trouble sleeping This list may not describe all possible side effects. Call your doctor for medical advice about side effects. You may report side effects to FDA at 1-800-FDA-1088. Where should I keep my medication? Keep out of  the reach of children and pets. Store at room temperature between 15 and 30 degrees C (59 and 86 degrees F). Throw away any unused medication after the expiration date. NOTE: This sheet is a summary. It may not cover all possible information. If you have questions about this medicine, talk to  your doctor, pharmacist, or health care provider.  2022 Elsevier/Gold Standard (2020-05-14 14:27:06)

## 2020-11-27 LAB — COMPLETE METABOLIC PANEL WITH GFR
AG Ratio: 2.1 (calc) (ref 1.0–2.5)
ALT: 19 U/L (ref 9–46)
AST: 17 U/L (ref 10–35)
Albumin: 4.4 g/dL (ref 3.6–5.1)
Alkaline phosphatase (APISO): 77 U/L (ref 35–144)
BUN: 19 mg/dL (ref 7–25)
CO2: 28 mmol/L (ref 20–32)
Calcium: 10 mg/dL (ref 8.6–10.3)
Chloride: 107 mmol/L (ref 98–110)
Creat: 0.98 mg/dL (ref 0.70–1.22)
Globulin: 2.1 g/dL (calc) (ref 1.9–3.7)
Glucose, Bld: 93 mg/dL (ref 65–99)
Potassium: 5.1 mmol/L (ref 3.5–5.3)
Sodium: 143 mmol/L (ref 135–146)
Total Bilirubin: 0.4 mg/dL (ref 0.2–1.2)
Total Protein: 6.5 g/dL (ref 6.1–8.1)
eGFR: 76 mL/min/{1.73_m2} (ref >=60)

## 2020-11-27 LAB — LIPID PANEL
Cholesterol: 175 mg/dL (ref <200)
HDL: 39 mg/dL — ABNORMAL LOW (ref >=40)
LDL Cholesterol (Calc): 99 mg/dL (calc)
Non-HDL Cholesterol (Calc): 136 mg/dL (calc) — ABNORMAL HIGH (ref <130)
Total CHOL/HDL Ratio: 4.5 (calc) (ref <5.0)
Triglycerides: 261 mg/dL — ABNORMAL HIGH (ref <150)

## 2020-11-27 LAB — CBC WITH DIFFERENTIAL/PLATELET
Absolute Monocytes: 631 cells/uL (ref 200–950)
Basophils Absolute: 30 cells/uL (ref 0–200)
Basophils Relative: 0.5 %
Eosinophils Absolute: 47 cells/uL (ref 15–500)
Eosinophils Relative: 0.8 %
HCT: 43.2 % (ref 38.5–50.0)
Hemoglobin: 14.4 g/dL (ref 13.2–17.1)
Lymphs Abs: 490 cells/uL — ABNORMAL LOW (ref 850–3900)
MCH: 30.7 pg (ref 27.0–33.0)
MCHC: 33.3 g/dL (ref 32.0–36.0)
MCV: 92.1 fL (ref 80.0–100.0)
MPV: 11.8 fL (ref 7.5–12.5)
Monocytes Relative: 10.7 %
Neutro Abs: 4702 cells/uL (ref 1500–7800)
Neutrophils Relative %: 79.7 %
Platelets: 160 10*3/uL (ref 140–400)
RBC: 4.69 10*6/uL (ref 4.20–5.80)
RDW: 12 % (ref 11.0–15.0)
Total Lymphocyte: 8.3 %
WBC: 5.9 10*3/uL (ref 3.8–10.8)

## 2020-11-27 LAB — MAGNESIUM: Magnesium: 2.2 mg/dL (ref 1.5–2.5)

## 2020-11-27 LAB — TSH: TSH: 0.99 mIU/L (ref 0.40–4.50)

## 2020-12-15 ENCOUNTER — Other Ambulatory Visit: Payer: Self-pay | Admitting: Internal Medicine

## 2020-12-15 DIAGNOSIS — K219 Gastro-esophageal reflux disease without esophagitis: Secondary | ICD-10-CM

## 2020-12-27 ENCOUNTER — Ambulatory Visit (INDEPENDENT_AMBULATORY_CARE_PROVIDER_SITE_OTHER): Payer: Medicare HMO

## 2020-12-27 ENCOUNTER — Other Ambulatory Visit: Payer: Self-pay

## 2020-12-27 VITALS — BP 164/90 | HR 65 | Temp 97.9°F | Resp 16 | Ht 66.0 in | Wt 149.6 lb

## 2020-12-27 DIAGNOSIS — Z23 Encounter for immunization: Secondary | ICD-10-CM

## 2020-12-27 DIAGNOSIS — Z0130 Encounter for examination of blood pressure without abnormal findings: Secondary | ICD-10-CM

## 2020-12-27 NOTE — Progress Notes (Signed)
The patient came in for blood pressure check today and his flu vaccine. He presented a blood pressure log with measurements below.   09/20       138/79 09/21    136/82  09/22   132/76 09/23   117/71 09/24   127/76 09/25   145/82  09/26   121/72 09/27   128/75 09/28   130/77 09/29   116/74 09/30   121/73 10/01   108/66 10/02   112/69 10/03   123/72  10/04   123/67 10/05   121/68 10/06   112/68 10/07   122/78 10/08   105/67 10/09   136/77  10/10   112/62 10/11   143/86 10/12   122/74 10/13   101/62 10/14   137/78

## 2021-01-07 ENCOUNTER — Other Ambulatory Visit: Payer: Self-pay | Admitting: Adult Health

## 2021-01-07 DIAGNOSIS — R000 Tachycardia, unspecified: Secondary | ICD-10-CM

## 2021-01-07 MED ORDER — METOPROLOL TARTRATE 50 MG PO TABS: 50.0000 mg | ORAL_TABLET | Freq: Two times a day (BID) | ORAL | 3 refills | Status: DC

## 2021-02-12 ENCOUNTER — Encounter: Payer: Medicare HMO | Admitting: Internal Medicine

## 2021-02-14 DIAGNOSIS — C61 Malignant neoplasm of prostate: Secondary | ICD-10-CM | POA: Diagnosis not present

## 2021-04-01 DIAGNOSIS — H52203 Unspecified astigmatism, bilateral: Secondary | ICD-10-CM | POA: Diagnosis not present

## 2021-04-01 DIAGNOSIS — Z961 Presence of intraocular lens: Secondary | ICD-10-CM | POA: Diagnosis not present

## 2021-04-01 DIAGNOSIS — H524 Presbyopia: Secondary | ICD-10-CM | POA: Diagnosis not present

## 2021-04-08 ENCOUNTER — Ambulatory Visit (INDEPENDENT_AMBULATORY_CARE_PROVIDER_SITE_OTHER): Payer: Medicare HMO | Admitting: Internal Medicine

## 2021-04-08 ENCOUNTER — Other Ambulatory Visit: Payer: Self-pay

## 2021-04-08 ENCOUNTER — Encounter: Payer: Self-pay | Admitting: Internal Medicine

## 2021-04-08 VITALS — BP 132/76 | HR 90 | Temp 97.8°F | Resp 16 | Ht 66.0 in | Wt 159.0 lb

## 2021-04-08 DIAGNOSIS — I700 Atherosclerosis of aorta: Secondary | ICD-10-CM

## 2021-04-08 DIAGNOSIS — Z0000 Encounter for general adult medical examination without abnormal findings: Secondary | ICD-10-CM

## 2021-04-08 DIAGNOSIS — I10 Essential (primary) hypertension: Secondary | ICD-10-CM

## 2021-04-08 DIAGNOSIS — E782 Mixed hyperlipidemia: Secondary | ICD-10-CM | POA: Diagnosis not present

## 2021-04-08 DIAGNOSIS — Z79899 Other long term (current) drug therapy: Secondary | ICD-10-CM

## 2021-04-08 DIAGNOSIS — E559 Vitamin D deficiency, unspecified: Secondary | ICD-10-CM

## 2021-04-08 DIAGNOSIS — Z136 Encounter for screening for cardiovascular disorders: Secondary | ICD-10-CM

## 2021-04-08 DIAGNOSIS — Z1212 Encounter for screening for malignant neoplasm of rectum: Secondary | ICD-10-CM

## 2021-04-08 DIAGNOSIS — Z8249 Family history of ischemic heart disease and other diseases of the circulatory system: Secondary | ICD-10-CM

## 2021-04-08 DIAGNOSIS — Z0001 Encounter for general adult medical examination with abnormal findings: Secondary | ICD-10-CM

## 2021-04-08 DIAGNOSIS — R7309 Other abnormal glucose: Secondary | ICD-10-CM | POA: Diagnosis not present

## 2021-04-08 NOTE — Progress Notes (Signed)
Annual  Screening/Preventative Visit  & Comprehensive Evaluation & Examination  Future Appointments  Date Time Provider Department  04/08/2021  2:00 PM Unk Pinto, MD [facility]  11/26/2021 11:30 AM Liane Comber, NP [facility]  04/13/2022  2:00 PM Unk Pinto, MD [facility]            This very nice 88 y.o. WWM presents for a Screening /Preventative Visit & comprehensive evaluation and management of multiple medical co-morbidities.  Patient has been followed for HTN, HLD, Prediabetes and Vitamin D Deficiency.  CT scan 2021 showed Aortic Atherosclerosis. Patient has Gleason 6 Prostate Ca  (2014) & is on finasteride followed by Dr Junious Silk.          HTN predates since 2003. Patient's BP has been controlled at home.  Today's BP was initially sl elevated & rechecked at goal  - 132/76. Patient denies any cardiac symptoms as chest pain, palpitations, shortness of breath, dizziness or ankle swelling.       Patient's hyperlipidemia is controlled with diet and ezetimibe . Patient denies myalgias or other medication SE's. Last lipids were at goal except elevated Trig's :  Lab Results  Component Value Date   CHOL 175 11/26/2020   HDL 39 (L) 11/26/2020   LDLCALC 99 11/26/2020   TRIG 261 (H) 11/26/2020   CHOLHDL 4.5 11/26/2020         Patient has hx/o prediabetes (A1c 5.8% /2008) and patient denies reactive hypoglycemic symptoms, visual blurring, diabetic polys or paresthesias. Last A1c was near goal :   Lab Results  Component Value Date   HGBA1C 5.8 (H) 08/19/2020          Finally, patient has history of Vitamin D Deficiency ("45 /2008) and last vitamin D was at goal :   Lab Results  Component Value Date   VD25OH 88 08/19/2020     Current Outpatient Medications on File Prior to Visit  Medication Sig   acetaminophen (TYLENOL) 500 MG tablet Take as needed (prn).    aspirin 81 MG EC tablet Take    buPROPion XL) 150 MG 24 hr tablet Take 1 tab daily in the morning for  mood.   Calcium Carbonate (CALCIUM 600 PO) Take  daily.   Cholecalciferol (VITAMIN D PO) Take 2,000 Units 2 (two) times daily.    Cyanocobalamin (VITAMIN B12 PO) Take daily.   Flaxseed, Linseed, (FLAX SEED OIL PO) Take 1,200 mg  3 (three) times daily.    gabapentin (NEURONTIN) 100 MG capsule Take 1 to 3 capsules at Bedtime for sciatica Pain   Magnesium 250 MG TABS Take daily.   metoprolol tartrate 50 MG tablet Take 1 tablet 2 (two) times daily.   Omega-3 FISH OIL  1,200 mg   Take 2 (two) times daily.    pantoprazole (PROTONIX) 40 MG tablet Take 1 tablet Daily       Allergies  Allergen Reactions   Pravastatin    Red Yeast Rice [Cholestin]    Sulfa Antibiotics    Vibramycin [Doxycycline Calcium]    Zetia [Ezetimibe]      Past Medical History:  Diagnosis Date   Essential hypertension 02/15/2007   Hyperlipidemia 07/13/2013   Prediabetes 07/13/2013   Prostate cancer ([place])    Pulmonary nodule (2008 resolved on f/u)  02/15/2007   Vitamin D deficiency 07/13/2013     Health Maintenance  Topic Date Due   COVID-19 Vaccine (3 - Moderna risk series) 03/26/2020   TETANUS/TDAP  05/16/2025   Pneumonia Vaccine 74+ Years old  Completed   INFLUENZA VACCINE  Completed   Zoster Vaccines- Shingrix  Completed   HPV VACCINES  Aged Out     Immunization History  Administered Date(s) Administered   DT (Pediatric) 05/17/2015   Influenza, High Dose 12/01/2017, 01/05/2019, 01/29/2020, 12/27/2020   Moderna SARS-COV2 Booster Vacc 02/27/2020   Moderna Sars-Covid-2 Vacc 05/01/2019, 05/30/2019   Pneumococcal -13 10/12/2013   Pneumococcal -23 05/17/2015   Zoster Recombinat (Shingrix) 08/13/2017, 10/14/2017    Last Colon -  03/2002 - Dr Deatra Ina - and aged out   Past Surgical History:  Procedure Laterality Date   PROSTATE BIOPSY  2020   PROSTATE BIOPSY  2014   TONSILECTOMY/ADENOIDECTOMY WITH MYRINGOTOMY     TREATMENT FISTULA ANAL       Family History  Problem Relation Age of Onset    Hyperlipidemia Brother    Hypertension Brother    Breast cancer Neg Hx    Colon cancer Neg Hx    Prostate cancer Neg Hx    Pancreatic cancer Neg Hx      Social History   Tobacco Use   Smoking status: Never   Smokeless tobacco: Never  Vaping Use   Vaping Use: Never used  Substance Use Topics   Alcohol use: No   Drug use: Never      ROS Constitutional: Denies fever, chills, weight loss/gain, headaches, insomnia,  night sweats or change in appetite. Does c/o fatigue. Eyes: Denies redness, blurred vision, diplopia, discharge, itchy or watery eyes.  ENT: Denies discharge, congestion, post nasal drip, epistaxis, sore throat, earache, hearing loss, dental pain, Tinnitus, Vertigo, Sinus pain or snoring.  Cardio: Denies chest pain, palpitations, irregular heartbeat, syncope, dyspnea, diaphoresis, orthopnea, PND, claudication or edema Respiratory: denies cough, dyspnea, DOE, pleurisy, hoarseness, laryngitis or wheezing.  Gastrointestinal: Denies dysphagia, heartburn, reflux, water brash, pain, cramps, nausea, vomiting, bloating, diarrhea, constipation, hematemesis, melena, hematochezia, jaundice or hemorrhoids Genitourinary: Denies dysuria, frequency, urgency, nocturia, hesitancy, discharge, hematuria or flank pain Musculoskeletal: Denies arthralgia, myalgia, stiffness, Jt. Swelling, pain, limp or strain/sprain. Denies Falls. Skin: Denies puritis, rash, hives, warts, acne, eczema or change in skin lesion Neuro: No weakness, tremor, incoordination, spasms, paresthesia or pain Psychiatric: Denies confusion, memory loss or sensory loss. Denies Depression. Endocrine: Denies change in weight, skin, hair change, nocturia, and paresthesia, diabetic polys, visual blurring or hyper / hypo glycemic episodes.  Heme/Lymph: No excessive bleeding, bruising or enlarged lymph nodes.   Physical Exam  BP 132/76    Pulse 90    Temp 97.8 F (36.6 C)    Resp 16    Ht 5\' 6"  (1.676 m)    Wt 159 lb (72.1 kg)     SpO2 95%    BMI 25.66 kg/m   General Appearance: Well nourished and well groomed and in no apparent distress.  Eyes: PERRLA, EOMs, conjunctiva no swelling or erythema, normal fundi and vessels. Sinuses: No frontal/maxillary tenderness ENT/Mouth: EACs patent / TMs  nl. Nares clear without erythema, swelling, mucoid exudates. Oral hygiene is good. No erythema, swelling, or exudate. Tongue normal, non-obstructing. Tonsils not swollen or erythematous. Hearing normal.  Neck: Supple, thyroid not palpable. No bruits, nodes or JVD. Respiratory: Respiratory effort normal.  BS equal and clear bilateral without rales, rhonci, wheezing or stridor. Cardio: Heart sounds are normal with regular rate and rhythm and no murmurs, rubs or gallops. Peripheral pulses are normal and equal bilaterally without edema. No aortic or femoral bruits. Chest: symmetric with normal excursions and percussion.  Abdomen: Soft, with Nl bowel sounds.  Nontender, no guarding, rebound, hernias, masses, or organomegaly.  Lymphatics: Non tender without lymphadenopathy.  Musculoskeletal: Full ROM all peripheral extremities, joint stability, 5/5 strength, and normal gait. Skin: Warm and dry without rashes, lesions, cyanosis, clubbing or  ecchymosis.  Neuro: Cranial nerves intact, reflexes equal bilaterally. Normal muscle tone, no cerebellar symptoms. Sensation intact.  Pysch: Alert and oriented X 3 with normal affect, insight and judgment appropriate.   Assessment and Plan  1. Annual Preventative/Screening Exam    2. Essential hypertension  - EKG 12-Lead - [place], RETROPERITNL ABD,  LTD - Urinalysis, Routine w reflex microscopic - Microalbumin / creatinine urine ratio - CBC with Differential/Platelet - COMPLETE METABOLIC PANEL WITH GFR - Magnesium - TSH  3. Hyperlipidemia, mixed  - EKG 12-Lead - [place], RETROPERITNL ABD,  LTD - Lipid panel - TSH  4. Abnormal glucose  - EKG 12-Lead - [place], RETROPERITNL ABD,  LTD -  Hemoglobin A1c - Insulin, random  5. Vitamin D deficiency  - VITAMIN D 25 Hydroxy  6. Screening for colorectal cancer  - POC Hemoccult Bld/Stl   7. Aortic atherosclerosis ([place]) by CT scan 2021  - EKG 12-Lead - [place], RETROPERITNL ABD,  LTD  8. Screening for ischemic heart disease  - EKG 12-Lead  9. FH: hypertension  - EKG 12-Lead - [place], RETROPERITNL ABD,  LTD  10. Screening for AAA (aortic abdominal aneurysm)  - [place], RETROPERITNL ABD,  LTD - Urinalysis, Routine w reflex microscopic - Microalbumin / creatinine urine ratio - Insulin, random - VITAMIN D 25 Hydroxy   11. Medication management  - CBC with Differential/Platelet - COMPLETE METABOLIC PANEL WITH GFR - Magnesium - Lipid panel - TSH - Hemoglobin A1c - Insulin, random - VITAMIN D 25 Hydroxy         Patient was counseled in prudent diet, weight control to achieve/maintain BMI less than 25, BP monitoring, regular exercise and medications as discussed.  Discussed med effects and SE's. Routine screening labs and tests as requested with regular follow-up as recommended. Over 40 minutes of exam, counseling, chart review and high complex critical decision making was performed   Kirtland Bouchard, MD

## 2021-04-08 NOTE — Patient Instructions (Signed)

## 2021-04-09 LAB — COMPLETE METABOLIC PANEL WITH GFR
AG Ratio: 2 (calc) (ref 1.0–2.5)
ALT: 30 U/L (ref 9–46)
AST: 22 U/L (ref 10–35)
Albumin: 4.5 g/dL (ref 3.6–5.1)
Alkaline phosphatase (APISO): 79 U/L (ref 35–144)
BUN: 18 mg/dL (ref 7–25)
CO2: 31 mmol/L (ref 20–32)
Calcium: 10.2 mg/dL (ref 8.6–10.3)
Chloride: 104 mmol/L (ref 98–110)
Creat: 1.05 mg/dL (ref 0.70–1.22)
Globulin: 2.2 g/dL (calc) (ref 1.9–3.7)
Glucose, Bld: 96 mg/dL (ref 65–99)
Potassium: 4.5 mmol/L (ref 3.5–5.3)
Sodium: 142 mmol/L (ref 135–146)
Total Bilirubin: 0.4 mg/dL (ref 0.2–1.2)
Total Protein: 6.7 g/dL (ref 6.1–8.1)
eGFR: 69 mL/min/{1.73_m2} (ref >=60)

## 2021-04-09 LAB — URINALYSIS, ROUTINE W REFLEX MICROSCOPIC
Bilirubin Urine: NEGATIVE
Glucose, UA: NEGATIVE
Hgb urine dipstick: NEGATIVE
Ketones, ur: NEGATIVE
Leukocytes,Ua: NEGATIVE
Nitrite: NEGATIVE
Protein, ur: NEGATIVE
Specific Gravity, Urine: 1.017 (ref 1.001–1.035)
pH: 7 (ref 5.0–8.0)

## 2021-04-09 LAB — CBC WITH DIFFERENTIAL/PLATELET
Absolute Monocytes: 536 cells/uL (ref 200–950)
Basophils Absolute: 18 cells/uL (ref 0–200)
Basophils Relative: 0.4 %
Eosinophils Absolute: 41 cells/uL (ref 15–500)
Eosinophils Relative: 0.9 %
HCT: 43.7 % (ref 38.5–50.0)
Hemoglobin: 14.4 g/dL (ref 13.2–17.1)
Lymphs Abs: 527 cells/uL — ABNORMAL LOW (ref 850–3900)
MCH: 30.1 pg (ref 27.0–33.0)
MCHC: 33 g/dL (ref 32.0–36.0)
MCV: 91.4 fL (ref 80.0–100.0)
MPV: 11.7 fL (ref 7.5–12.5)
Monocytes Relative: 11.9 %
Neutro Abs: 3380 cells/uL (ref 1500–7800)
Neutrophils Relative %: 75.1 %
Platelets: 150 10*3/uL (ref 140–400)
RBC: 4.78 10*6/uL (ref 4.20–5.80)
RDW: 12.4 % (ref 11.0–15.0)
Total Lymphocyte: 11.7 %
WBC: 4.5 10*3/uL (ref 3.8–10.8)

## 2021-04-09 LAB — TSH: TSH: 0.91 m[IU]/L (ref 0.40–4.50)

## 2021-04-09 LAB — HEMOGLOBIN A1C
Hgb A1c MFr Bld: 5.8 %{Hb} — ABNORMAL HIGH
Mean Plasma Glucose: 120 mg/dL
eAG (mmol/L): 6.6 mmol/L

## 2021-04-09 LAB — VITAMIN D 25 HYDROXY (VIT D DEFICIENCY, FRACTURES): Vit D, 25-Hydroxy: 74 ng/mL (ref 30–100)

## 2021-04-09 LAB — LIPID PANEL
Cholesterol: 180 mg/dL
HDL: 38 mg/dL — ABNORMAL LOW
LDL Cholesterol (Calc): 109 mg/dL — ABNORMAL HIGH
Non-HDL Cholesterol (Calc): 142 mg/dL — ABNORMAL HIGH
Total CHOL/HDL Ratio: 4.7 (calc)
Triglycerides: 210 mg/dL — ABNORMAL HIGH

## 2021-04-09 LAB — MICROALBUMIN / CREATININE URINE RATIO
Creatinine, Urine: 80 mg/dL (ref 20–320)
Microalb Creat Ratio: 3 mcg/mg creat (ref <30)
Microalb, Ur: 0.2 mg/dL

## 2021-04-09 LAB — INSULIN, RANDOM: Insulin: 34.4 u[IU]/mL — ABNORMAL HIGH

## 2021-04-09 LAB — MAGNESIUM: Magnesium: 2.3 mg/dL (ref 1.5–2.5)

## 2021-04-09 NOTE — Progress Notes (Signed)
============================================================ °-   Test results slightly outside the reference range are not unusual. If there is anything important, I will review this with you,  otherwise it is considered normal test values.  If you have further questions,  please do not hesitate to contact me at the office or via My Chart.  ============================================================ ============================================================  -   -  Total  Chol = 180     - Great             (  Ideal  or  Goal is less than 180  !  )   - But   -  Bad / Dangerous LDL  Chol =  109   -  Elevated              (  Ideal  or  Goal is less than 70  !  )   So, Need stricter diet    - Cholesterol is too high - Recommend low cholesterol diet   - Cholesterol only comes from animal sources  - ie. meat, dairy, egg yolks  - Eat all the vegetables you want.  - Avoid meat, especially red meat - Beef AND Pork .  - Avoid cheese & dairy - milk & ice cream.     - Cheese is the most concentrated form of trans-fats which  is the worst thing to clog up our arteries.   - Veggie cheese is OK which can be found in the fresh  produce section at [employer] or [employer] or [employer] ============================================================ ============================================================  -  And Triglycerides (   210   ) or fats in blood are too high  (goal is less than 150)    - Recommend avoid fried & greasy foods,  sweets / candy,   - Avoid white rice  (brown or wild rice or Quinoa is OK),   - Avoid white potatoes  (sweet potatoes are OK)   - Avoid anything made from white flour  - bagels, doughnuts, rolls, buns, biscuits, white and   wheat breads, pizza crust and traditional  pasta made of white flour & egg white  - (vegetarian pasta or spinach or wheat pasta is OK).    - Multi-grain bread is OK - like multi-grain flat bread or  sandwich thins.    - Avoid alcohol in excess.   - Exercise is also important. ============================================================ ============================================================  -  A1c = 5.8% - still borderline elevated sugars  - So . . . . . Marland Kitchen  - Avoid Sweets, Candy & White Stuff   - White Rice, White [place], White Flour  - Breads &  Pasta ============================================================ ============================================================  -  Vitamin D = 74   - Excellent - Please keep dose same ============================================================ ============================================================  -  All Else - CBC - Kidneys - Electrolytes - Liver - Magnesium & Thyroid    - all  Normal / OK ============================================================ ============================================================

## 2021-04-14 NOTE — Progress Notes (Signed)
Htn    Future Appointments  Date Time Provider Department  04/15/2021  3:30 PM Unk Pinto, MD [facility]  07/29/2021 11:30 AM Liane Comber, NP [facility]  11/04/2021 11:30 AM Unk Pinto, MD [facility]  04/13/2022  2:00 PM Unk Pinto, MD [facility]   History of Present Illness:     Patient is a very nice 88 yo WWM with HTN and negative HT systems review who presents with concerns of an excoriated ulcerative skin lesion of his Rt cheek.    Medications    metoprolol tartrate 50 MG tablet, Take 1 tablet 2 times daily.   acetaminophen 500 MG tablet, Take 500 mg  as needed (prn).    aspirin 81 MG EC tablet, Take daily   VITAMIN B12 , Take daily.   buPROPion XL 150 MG 24 hr tablet, Take 1 tab daily in the morning for mood.   Calcium Carbonate (CALCIUM 600 PO), Take daily.   Cholecalciferol (VITAMIN D PO), Take 2,000 Int'l Units 2 (two) times daily.    Flaxseed, Linseed, (FLAX SEED OIL PO), Take 1,200 mg 3 (three) times daily.    gabapentin (NEURONTIN) 100 MG capsule, Take 1 to 3 capsules at Bedtime for sciatica Pain   Magnesium 250 MG TABS, Take 250 mg daily.   Omega-3 Fatty Acids (FISH OIL PO), Take 1,200 mg 2 (two) times daily.    pantoprazole (PROTONIX) 40 MG tablet, Take 1 tablet Daily    Problem list He has Essential hypertension; Hyperlipidemia; Vitamin D deficiency; Medication management; Malignant neoplasm of prostate ([employer]); Pulmonary sarcoidosis (1988) ; Other abnormal glucose; BMI 23.0-23.9, adult; Plantar fasciitis of left foot; Gastroesophageal reflux disease without esophagitis; Aortic atherosclerosis ([place]) by CT scan 2021; Left foot drop; Radiation-induced lumbosacral plexopathy; and Mild depression on their problem list.   Observations/Objective:  BP (!) 150/80    Pulse 94    Temp 97.8 F (36.6 C)    Resp 16    Ht 5\' 6"  (1.676 m)    Wt 159 lb (72.1 kg)    SpO2 95%    BMI 25.66 kg/m   HEENT - WNL. Neck - supple.  Chest - Clear equal BS. Cor - Nl HS.  RRR w/o sig MGR. PP 1(+). No edema. MS- FROM w/o deformities.  Gait Nl. Neuro -  Nl w/o focal abnormalities. Skin - There is a reddish slightly indurated /irregular 6 mm x 8 mm  flat lesion with central superficial ulceration in the Rt preauricular cheek area.   Procedure   ( CPT :  [phone] )      After informed consent and aseptic prep with alcohol, the area around the lesion of the Rt cheek was anesthetized with 1 ml of Marcaine 0.5% sub-cut & intradermal. Then with a #10 scalpel, the lesion was excised full thickness with a 2-3 mm margin and sent for path analysis. Them the wound edges were slightly undermined to allow opposition edges to evert . Next the wound edges were aligned and secured with a running lock suture  x 5 sutures. Hemostasis was obtained and a 2" x 3" Tegaderm was applied. Patient was instructed in post-op wound care.   Assessment and Plan:  1. Essential hypertension   2. Basal Cell Epithelioma   - Surgical pathology   Follow Up Instructions:        I discussed the assessment and treatment plan with the patient. The patient was provided an opportunity to ask questions and all were answered. The patient agreed with the plan and  demonstrated an understanding of the instructions.       The patient was advised to call back or seek an in-person evaluation if the symptoms worsen or if the condition fails to improve as anticipated.    Kirtland Bouchard, MD

## 2021-04-15 ENCOUNTER — Ambulatory Visit (INDEPENDENT_AMBULATORY_CARE_PROVIDER_SITE_OTHER): Payer: Medicare HMO | Admitting: Internal Medicine

## 2021-04-15 ENCOUNTER — Other Ambulatory Visit: Payer: Self-pay

## 2021-04-15 ENCOUNTER — Encounter: Payer: Self-pay | Admitting: Internal Medicine

## 2021-04-15 ENCOUNTER — Other Ambulatory Visit: Payer: Self-pay | Admitting: Internal Medicine

## 2021-04-15 VITALS — BP 150/80 | HR 94 | Temp 97.8°F | Resp 16 | Ht 66.0 in | Wt 159.0 lb

## 2021-04-15 DIAGNOSIS — I10 Essential (primary) hypertension: Secondary | ICD-10-CM | POA: Diagnosis not present

## 2021-04-15 DIAGNOSIS — C44310 Basal cell carcinoma of skin of unspecified parts of face: Secondary | ICD-10-CM | POA: Diagnosis not present

## 2021-04-15 DIAGNOSIS — D485 Neoplasm of uncertain behavior of skin: Secondary | ICD-10-CM

## 2021-04-15 DIAGNOSIS — C44319 Basal cell carcinoma of skin of other parts of face: Secondary | ICD-10-CM | POA: Diagnosis not present

## 2021-04-21 ENCOUNTER — Other Ambulatory Visit: Payer: Self-pay

## 2021-04-21 ENCOUNTER — Encounter: Payer: Self-pay | Admitting: Internal Medicine

## 2021-04-21 ENCOUNTER — Ambulatory Visit: Payer: Medicare HMO | Admitting: Internal Medicine

## 2021-04-21 VITALS — BP 158/82 | HR 80 | Temp 97.9°F | Resp 16 | Ht 66.0 in | Wt 157.4 lb

## 2021-04-21 DIAGNOSIS — Z1211 Encounter for screening for malignant neoplasm of colon: Secondary | ICD-10-CM

## 2021-04-21 DIAGNOSIS — C44310 Basal cell carcinoma of skin of unspecified parts of face: Secondary | ICD-10-CM

## 2021-04-21 DIAGNOSIS — Z1212 Encounter for screening for malignant neoplasm of rectum: Secondary | ICD-10-CM

## 2021-04-21 LAB — POC HEMOCCULT BLD/STL (HOME/3-CARD/SCREEN)
Card #2 Fecal Occult Blod, POC: NEGATIVE
Card #3 Fecal Occult Blood, POC: NEGATIVE
Fecal Occult Blood, POC: NEGATIVE

## 2021-04-21 NOTE — Progress Notes (Signed)
° °  Patient returns for suture removal from 5 days ago of an excisional bx of hie Left cheek  Path returned "BCE" with no comment of involved margins, so patient reassure , but still advised to monitor the site for recurrence  - Wound site is clean w/o signs of infection & healing ridge appears adequate  so all # 5 sutures removed and sterile Band-Aid applied . Patient instructed in wound care.

## 2021-04-22 DIAGNOSIS — Z1212 Encounter for screening for malignant neoplasm of rectum: Secondary | ICD-10-CM

## 2021-04-22 DIAGNOSIS — Z1211 Encounter for screening for malignant neoplasm of colon: Secondary | ICD-10-CM

## 2021-05-21 DIAGNOSIS — R350 Frequency of micturition: Secondary | ICD-10-CM | POA: Diagnosis not present

## 2021-05-21 DIAGNOSIS — N401 Enlarged prostate with lower urinary tract symptoms: Secondary | ICD-10-CM | POA: Diagnosis not present

## 2021-05-21 DIAGNOSIS — Z8546 Personal history of malignant neoplasm of prostate: Secondary | ICD-10-CM | POA: Diagnosis not present

## 2021-07-29 ENCOUNTER — Ambulatory Visit (INDEPENDENT_AMBULATORY_CARE_PROVIDER_SITE_OTHER): Payer: Medicare HMO | Admitting: Adult Health

## 2021-07-29 ENCOUNTER — Encounter: Payer: Self-pay | Admitting: Adult Health

## 2021-07-29 VITALS — BP 142/84 | HR 81 | Temp 97.7°F | Wt 157.8 lb

## 2021-07-29 DIAGNOSIS — M21372 Foot drop, left foot: Secondary | ICD-10-CM | POA: Diagnosis not present

## 2021-07-29 DIAGNOSIS — E782 Mixed hyperlipidemia: Secondary | ICD-10-CM | POA: Diagnosis not present

## 2021-07-29 DIAGNOSIS — Z0000 Encounter for general adult medical examination without abnormal findings: Secondary | ICD-10-CM

## 2021-07-29 DIAGNOSIS — Z79899 Other long term (current) drug therapy: Secondary | ICD-10-CM | POA: Diagnosis not present

## 2021-07-29 DIAGNOSIS — G541 Lumbosacral plexus disorders: Secondary | ICD-10-CM | POA: Diagnosis not present

## 2021-07-29 DIAGNOSIS — K219 Gastro-esophageal reflux disease without esophagitis: Secondary | ICD-10-CM | POA: Diagnosis not present

## 2021-07-29 DIAGNOSIS — I10 Essential (primary) hypertension: Secondary | ICD-10-CM

## 2021-07-29 DIAGNOSIS — R6889 Other general symptoms and signs: Secondary | ICD-10-CM | POA: Diagnosis not present

## 2021-07-29 DIAGNOSIS — E559 Vitamin D deficiency, unspecified: Secondary | ICD-10-CM | POA: Diagnosis not present

## 2021-07-29 DIAGNOSIS — R7309 Other abnormal glucose: Secondary | ICD-10-CM | POA: Diagnosis not present

## 2021-07-29 DIAGNOSIS — D860 Sarcoidosis of lung: Secondary | ICD-10-CM

## 2021-07-29 DIAGNOSIS — Z0001 Encounter for general adult medical examination with abnormal findings: Secondary | ICD-10-CM

## 2021-07-29 DIAGNOSIS — C61 Malignant neoplasm of prostate: Secondary | ICD-10-CM

## 2021-07-29 DIAGNOSIS — I700 Atherosclerosis of aorta: Secondary | ICD-10-CM

## 2021-07-29 DIAGNOSIS — Z6825 Body mass index (BMI) 25.0-25.9, adult: Secondary | ICD-10-CM

## 2021-07-29 MED ORDER — PANTOPRAZOLE SODIUM 20 MG PO TBEC: DELAYED_RELEASE_TABLET | ORAL | 3 refills | Status: DC

## 2021-07-29 NOTE — Patient Instructions (Signed)
?[name] , ?Thank you for taking time to come for your Medicare Wellness Visit. I appreciate your ongoing commitment to your health goals. Please review the following plan we discussed and let me know if I can assist you in the future.  ? ?These are the goals we discussed: ? Goals   ? ?  Blood Pressure < 140/80   ?  Exercise 150 min/wk Moderate Activity   ?  LDL CALC < 100   ? ?  ?  ?This is a list of the screening recommended for you and due dates:  ?Health Maintenance  ?Topic Date Due  ? COVID-19 Vaccine (3 - Moderna risk series) 08/14/2021*  ? Flu Shot  10/14/2021  ? Tetanus Vaccine  05/16/2025  ? Pneumonia Vaccine  Completed  ? Zoster (Shingles) Vaccine  Completed  ? HPV Vaccine  Aged Out  ?*Topic was postponed. The date shown is not the original due date.  ? ? ? ?GETTING OFF OF PPI's ? ?  Nexium/protonix/prilosec/Omeprazole/Dexilant/Aciphex are called PPI's, they are great at healing your stomach.  ?Taken for a long time they  can increase the risk of osteoporosis (weakening of your bones), pneumonia, low magnesium, restless legs, Cdiff (infection that causes diarrhea). ?These are things we can treat or monitor for because some people will need to remain on therapy if you have a hiatal hernia, previous GI bleed, barret's esophagus, etc.  ?If you do not have a condition where we feel you need to take this long term we can work to get off the proton pump inhibitor.  ? ?For now, let's try just reducing dose of pantoprazole from 40 mg to 20 mg daily.  ?Please monitor for any signs of acid reflux.  ? ? ? ?If doing well, the next step will be to try a slow transition over the pepcid/famotidine ? ?- Start taking the nexium/protonix/prilosec/PPI  every other day with pepcid famotadine 2 x a day for 2-4 weeks ?- some people stay on this dosage and can not taper off further.  ? ?- then decrease the PPI to every 3 days while taking the pepcid twice a day the other  days for 2-4  Weeks ? ?- then you can try the  pepcid '300mg'$  once at night or up to 2 x day  ? ?- you can continue on this once at night or stop all together ? ?- Avoid alcohol, spicy foods, NSAIDS (aleve, ibuprofen) at this time. See foods below.  ? ?+++++++++++++++++++++++++++++++++++++++++++ ? ?Food Choices for Gastroesophageal Reflux Disease ? ?When you have gastroesophageal reflux disease (GERD), the foods you eat and your eating habits are very important. Choosing the right foods can help ease the discomfort of GERD. ?WHAT GENERAL GUIDELINES DO I NEED TO FOLLOW? ?Choose fruits, vegetables, whole grains, low-fat dairy products, and low-fat meat, fish, and poultry. ?Limit fats such as oils, salad dressings, butter, nuts, and avocado. ?Keep a food diary to identify foods that cause symptoms. ?Avoid foods that cause reflux. These may be different for different people. ?Eat frequent small meals instead of three large meals each day. ?Eat your meals slowly, in a relaxed setting. ?Limit fried foods. ?Cook foods using methods other than frying. ?Avoid drinking alcohol. ?Avoid drinking large amounts of liquids with your meals. ?Avoid bending over or lying down until 2-3 hours after eating. ? ?WHAT FOODS ARE NOT RECOMMENDED? ?The following are some foods and drinks that may worsen your symptoms: ? ?Vegetables ?Tomatoes. Tomato juice. Tomato and spaghetti sauce. Chili peppers.  Onion and garlic. Horseradish. ?Fruits ?Oranges, grapefruit, and lemon (fruit and juice). ?Meats ?High-fat meats, fish, and poultry. This includes hot dogs, ribs, ham, sausage, salami, and bacon. ?Dairy ?Whole milk and chocolate milk. Sour cream. Cream. Butter. Ice cream. Cream cheese.  ?Beverages ?Coffee and tea, with or without caffeine. Carbonated beverages or energy drinks. ?Condiments ?Hot sauce. Barbecue sauce.  ?Sweets/Desserts ?Chocolate and cocoa. Donuts. Peppermint and spearmint. ?Fats and Oils ?High-fat foods, including [place] fries and potato chips. ?Other ?Vinegar. Strong spices,  such as black pepper, white pepper, red pepper, cayenne, curry powder, cloves, ginger, and chili powder. ? ? ?

## 2021-07-29 NOTE — Progress Notes (Signed)
MEDICARE ANNUAL WELLNESS VISIT AND FOLLOW UP ?Assessment:  ? ?Diagnoses and all orders for this visit: ? ?Annual Medicare Wellness Visit ?Due annually  ?Health maintenance reviewed ? ?Aortic atherosclerosis ([place]) ?Per CT 03/2019 ?Control blood pressure, cholesterol, glucose, increase exercise.  ? ?Essential hypertension ?Well controlled at home; improved on recheck today  ?Monitor blood pressure at home; call if consistently over 130/80 ?Continue DASH diet.   ?Reminder to go to the ER if any CP, SOB, nausea, dizziness, severe HA, changes vision/speech, left arm numbness and tingling and jaw pain. ? ?Pulmonary sarcoidosis (1988)  ?Followed by pulmonology PRN ?Denies recent sx ? ?Prostate cancer ([place])  ?Managed by Dr. Junious Silk, s/p radiation, completed eligard x 18 months ?Plans to follow up 6 months ? ?Vitamin D deficiency ?At goal at last check; continue to recommend supplementation for goal of 60-100 ?Defer vitamin D level to CPE ? ?Other abnormal glucose (Prediabetes ?Discussed disease and risks ?Discussed diet/exercise, weight management  ?Check A1C q16m CMP for glucose otherwise ? ?Medication management ?CBC, CMP/GFR ? ?Hyperlipidemia ?No longer treated due to med intolerances and patient preference  ?Mild elevations monitored ?Continue cholesterol diet/exercise ?Check lipid panel  ? ?BMI 25 ?Continue to recommend diet heavy in fruits and veggies and low in animal meats, cheeses, and dairy products, appropriate calorie intake ?Discuss exercise recommendations routinely ?Continue to monitor weight at each visit ? ?left foot drop/ moderate fall risk ?Continue ankle brace, cane/walker,  ?Has completed PT and had home safety check, continue home exercises ? ?GERD ?Well managed on current medications, discussed risks of chronic PPI, hasn't attempted taper, will try reducing from pantoprazole 40 mg to 20 mg ?If tolerating well plan to initiate slow transition to famotidine  ?Discussed diet, avoiding triggers and other  lifestyle changes ? ? ? ?Orders Placed This Encounter  ?Procedures  ? CBC with Differential/Platelet  ? COMPLETE METABOLIC PANEL WITH GFR  ? Magnesium  ? Lipid panel  ? TSH  ? ? ?Over 30 minutes of exam, counseling, chart review, and critical decision making was performed ? ?Future Appointments  ?Date Time Provider [place] ?11/04/2021 11:30 AM MUnk Pinto MD [facility] None  ?04/13/2022  2:00 PM MUnk Pinto MD [facility] None  ?07/30/2022 11:00 AM MMagda Bernheim NP [facility] None  ? ?  ? ?Plan:  ? ?During the course of the visit the patient was educated and counseled about appropriate screening and preventive services including:  ? ?Pneumococcal vaccine  ?Influenza vaccine ?Prevnar 13 ?Td vaccine ?Screening electrocardiogram ?Colorectal cancer screening ?Diabetes screening ?Glaucoma screening ?Nutrition counseling  ? ? ?Subjective:  ?Matthew SCERBOis a 88y.o. male who presents for Medicare Annual Wellness Visit and 3 month follow up. He has Essential hypertension; Hyperlipidemia; Vitamin D deficiency; Medication management; Malignant neoplasm of prostate ([place]; Pulmonary sarcoidosis (1988) ; Other abnormal glucose; BMI 25.0-25.9,adult; Plantar fasciitis of left foot; Gastroesophageal reflux disease without esophagitis; Aortic atherosclerosis ([place] by [place] scan 2021; Left foot drop; and Radiation-induced lumbosacral plexopathy on their problem list. ? ?Patient has hx/o Prostate Cancer (2014 ) followed by Dr EJunious Silk had PSA jump with recurrence, initiated on Eligard in Feb 2021, had prostate markers placed 4/6 and underwent external radiation, 40 treatments and complted eliguard. He developed L foot drop following radiation, [facility] Dr. DReatha Armourordered PT with improvement, felt possible radiation-induced lumbosacral plexopathy. Continues with home exercises. Also ankle brace device for foot drop.  ? ?He uses cane/rolling walker. Had home safety check,  no steps, has walk in shower and hand rails. No longer needing gabapentin for pain.  ? ?He reported mild depressive sx, low energy, was initiated on wellbutrin 150 mg, reports caused some jitteriness and stopped taking, but reports sx seem to have resolved off of eligard.  ? ?GERD on protonix 40 mg daily for many years, hasn't attempted a taper, denies breakthrough sx.  ? ?BMI is Body mass index is 25.47 kg/m?., he has been working on diet and exercise, does leg strengthening exercises daily 20-30 min.  ?Wt Readings from Last 3 Encounters:  ?07/29/21 157 lb 12.8 oz (71.6 kg)  ?04/21/21 157 lb 6.4 oz (71.4 kg)  ?04/15/21 159 lb (72.1 kg)  ? ?Ct abd/pelvis in 03/2019 showed aortic atherosclerosis ?His blood pressure has been controlled at home (110-130s/60s, has new BP cuff that was verified in office), today their BP is BP: (!) 142/84, prone  ?He does workout. He denies chest pain, shortness of breath, dizziness.  ? ?He is not on cholesterol medication. Hx of intolerance of RYRS, welcol, zetia, pravastatin (remote, can't recall what sx he had, intolerance). Currently taking fish oil. His cholesterol is not at goal. The cholesterol last visit was:   ?Lab Results  ?Component Value Date  ? CHOL 180 04/08/2021  ? HDL 38 (L) 04/08/2021  ? LDLCALC 109 (H) 04/08/2021  ? TRIG 210 (H) 04/08/2021  ? CHOLHDL 4.7 04/08/2021  ? ?He has been working on diet and exercise for glucose management, and denies foot ulcerations, increased appetite, nausea, paresthesia of the feet, polydipsia, polyuria, visual disturbances, vomiting and weight loss. Last A1C in the office was:  ?Lab Results  ?Component Value Date  ? HGBA1C 5.8 (H) 04/08/2021  ? ?Last GFR ?Lab Results  ?Component Value Date  ? EGFR 69 04/08/2021  ? ? Patient is on Vitamin D supplement and at goal:    ?Lab Results  ?Component Value Date  ? VD25OH 74 04/08/2021  ?   ? ? ? ?Medication Review: ? ? ?Current Outpatient Medications (Cardiovascular):  ?  metoprolol tartrate  (LOPRESSOR) 50 MG tablet, Take 1 tablet (50 mg total) by mouth 2 (two) times daily. ? ? ?Current Outpatient Medications (Analgesics):  ?  acetaminophen (TYLENOL) 500 MG tablet, Take 500 mg by mouth as needed (prn).  ?  aspirin 81 MG EC tablet, Take by mouth. ? ?Current Outpatient Medications (Hematological):  ?  Cyanocobalamin (VITAMIN B12 PO), Take by mouth daily. ? ?Current Outpatient Medications (Other):  ?  Alpha-Lipoic Acid 50 MG CAPS, Take 1 tablet by mouth daily. ?  Cholecalciferol (VITAMIN D PO), Take 2,000 Int'l Units by mouth 2 (two) times daily.  ?  Flaxseed, Linseed, (FLAX SEED OIL PO), Take 1,200 mg by mouth 3 (three) times daily.  ?  Magnesium 250 MG TABS, Take 250 mg by mouth daily. ?  Omega-3 Fatty Acids (FISH OIL PO), Take 1,200 mg by mouth 2 (two) times daily.  ?  pantoprazole (PROTONIX) 20 MG tablet, Take 1 tablet Daily  to Prevent Heartburn & Indigestion. ? ?Allergies: ?Allergies  ?Allergen Reactions  ? Pravastatin   ? Red Yeast Rice [Cholestin]   ? Sulfa Antibiotics   ? Vibramycin [Doxycycline Calcium]   ? Zetia [Ezetimibe]   ? ? ?Current Problems (verified) ?has Essential hypertension; Hyperlipidemia; Vitamin D deficiency; Medication management; Malignant neoplasm of prostate ([place]); Pulmonary sarcoidosis (1988) ; Other abnormal glucose; BMI 25.0-25.9,adult; Plantar fasciitis of left foot; Gastroesophageal reflux disease without esophagitis; Aortic atherosclerosis ([place]) by CT scan 2021; Left  foot drop; and Radiation-induced lumbosacral plexopathy on their problem list. ? ?Screening Tests ?Immunization History  ?Administered Date(s) Administered  ? DT (Pediatric) 05/17/2015  ? Influenza, High Dose Seasonal PF 01/23/2014, 02/13/2015, 11/27/2015, 12/01/2016, 12/01/2017, 01/05/2019, 01/29/2020, 12/27/2020  ? Moderna SARS-COV2 Booster Vaccination 02/27/2020  ? Moderna Sars-Covid-2 Vaccination 05/01/2019, 05/30/2019  ? Pneumococcal Conjugate-13 10/12/2013  ? Pneumococcal Polysaccharide-23 05/17/2015   ? Zoster Recombinat (Shingrix) 08/13/2017, 10/14/2017  ? ?Health Maintenance  ?Topic Date Due  ? COVID-19 Vaccine (3 - Moderna risk series) 08/14/2021 (Originally 03/26/2020)  ? INFLUENZA VACCINE  10/14/2021

## 2021-07-30 LAB — COMPLETE METABOLIC PANEL WITH GFR
AG Ratio: 1.9 (calc) (ref 1.0–2.5)
ALT: 30 U/L (ref 9–46)
AST: 23 U/L (ref 10–35)
Albumin: 4.4 g/dL (ref 3.6–5.1)
Alkaline phosphatase (APISO): 92 U/L (ref 35–144)
BUN: 18 mg/dL (ref 7–25)
CO2: 22 mmol/L (ref 20–32)
Calcium: 9.8 mg/dL (ref 8.6–10.3)
Chloride: 108 mmol/L (ref 98–110)
Creat: 0.95 mg/dL (ref 0.70–1.22)
Globulin: 2.3 g/dL (calc) (ref 1.9–3.7)
Glucose, Bld: 90 mg/dL (ref 65–99)
Potassium: 4.3 mmol/L (ref 3.5–5.3)
Sodium: 142 mmol/L (ref 135–146)
Total Bilirubin: 0.6 mg/dL (ref 0.2–1.2)
Total Protein: 6.7 g/dL (ref 6.1–8.1)
eGFR: 78 mL/min/{1.73_m2} (ref >=60)

## 2021-07-30 LAB — CBC WITH DIFFERENTIAL/PLATELET
Absolute Monocytes: 536 cells/uL (ref 200–950)
Basophils Absolute: 41 cells/uL (ref 0–200)
Basophils Relative: 0.8 %
Eosinophils Absolute: 51 cells/uL (ref 15–500)
Eosinophils Relative: 1 %
HCT: 46 % (ref 38.5–50.0)
Hemoglobin: 15.5 g/dL (ref 13.2–17.1)
Lymphs Abs: 622 cells/uL — ABNORMAL LOW (ref 850–3900)
MCH: 30.3 pg (ref 27.0–33.0)
MCHC: 33.7 g/dL (ref 32.0–36.0)
MCV: 89.8 fL (ref 80.0–100.0)
MPV: 11.7 fL (ref 7.5–12.5)
Monocytes Relative: 10.5 %
Neutro Abs: 3851 cells/uL (ref 1500–7800)
Neutrophils Relative %: 75.5 %
Platelets: 167 10*3/uL (ref 140–400)
RBC: 5.12 10*6/uL (ref 4.20–5.80)
RDW: 12.4 % (ref 11.0–15.0)
Total Lymphocyte: 12.2 %
WBC: 5.1 10*3/uL (ref 3.8–10.8)

## 2021-07-30 LAB — LIPID PANEL
Cholesterol: 169 mg/dL (ref <200)
HDL: 37 mg/dL — ABNORMAL LOW (ref >=40)
LDL Cholesterol (Calc): 99 mg/dL (calc)
Non-HDL Cholesterol (Calc): 132 mg/dL (calc) — ABNORMAL HIGH (ref <130)
Total CHOL/HDL Ratio: 4.6 (calc) (ref <5.0)
Triglycerides: 210 mg/dL — ABNORMAL HIGH (ref <150)

## 2021-07-30 LAB — TSH: TSH: 1.15 mIU/L (ref 0.40–4.50)

## 2021-07-30 LAB — MAGNESIUM: Magnesium: 2.1 mg/dL (ref 1.5–2.5)

## 2021-08-27 IMAGING — MR MR PROSTATE WO/W CM
56 series · 56 of 56 positions shown · IV contrast (12ml Multihance)
Comparison: 04/10/2015

CLINICAL DATA: Prostate carcinoma, Gleason score 3+3=6. Rising PSA
level.

EXAM:
MR PROSTATE WITHOUT AND WITH CONTRAST
TECHNIQUE: Multiplanar multisequence MRI images were obtained of the pelvis
centered about the prostate. Pre and post contrast images were
obtained.
CONTRAST:  12mL MULTIHANCE GADOBENATE DIMEGLUMINE 529 MG/ML IV SOLN

[Series 3: T1 · axial · 8.0mm · 1.06mm/px · 1 of 28 slices shown (1 of 2)]
[im 1/28]
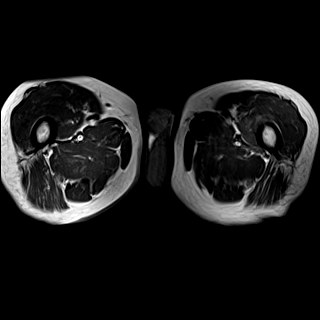

[Series 4: bSSFP fat-sat · axial · 8.0mm · 0.74mm/px · 1 of 28 slices shown]
[im 1/28]
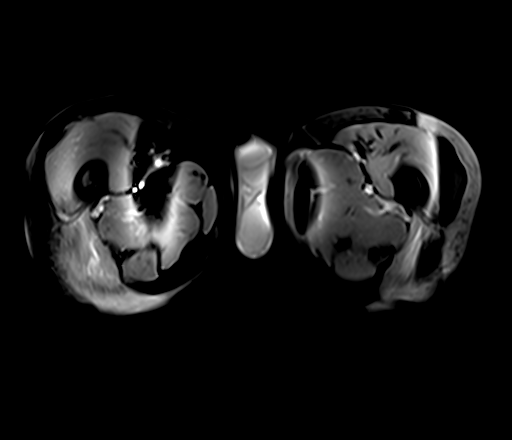

[Series 5: T2 · sagittal · 3.5mm · 0.56mm/px · 1 of 39 slices shown (1 of 4)]
[im 1/39]
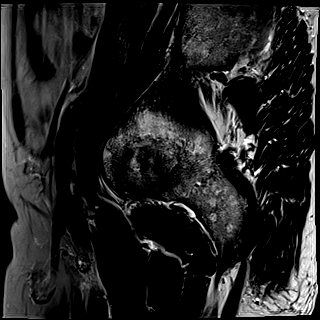

[Series 6: T1 · axial · 3.0mm · 0.31mm/px · 1 of 26 slices shown (2 of 2)]
[im 1/26]
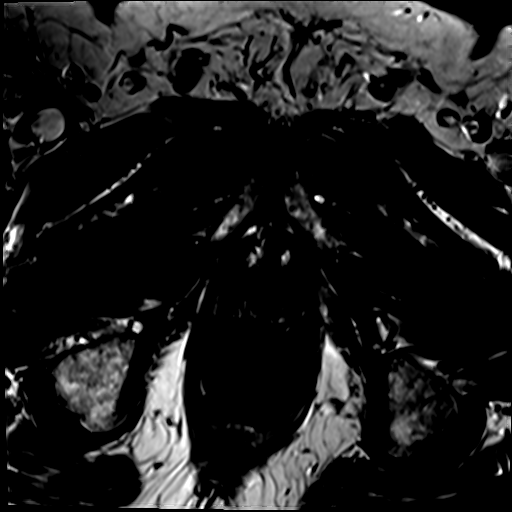

[Series 7: T2 · axial · 3.5mm · 0.56mm/px · 1 of 25 slices shown (2 of 4)]
[im 1/25]
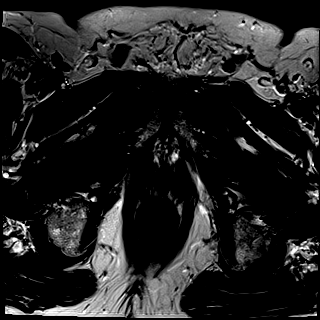

[Series 8: T2 · axial · 1.0mm · 1.04mm/px · 1 of 88 slices shown (3 of 4)]
[im 1/88]
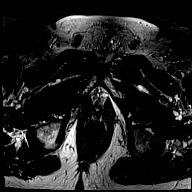

[Series 9: T2 · coronal · 3.5mm · 0.56mm/px · 1 of 23 slices shown (4 of 4)]
[im 1/23]
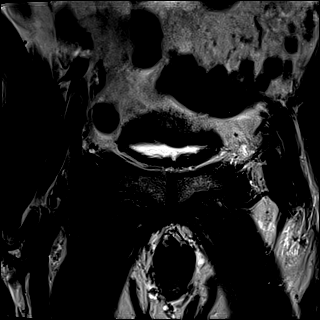

[Series 10: DWI · axial · 3.5mm · 1.56mm/px · 1 of 75 slices shown (1 of 2)]
[im 1/75]
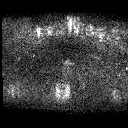

[Series 11: DWI · axial · 3.5mm · 1.56mm/px · 1 of 25 slices shown (2 of 2)]
[im 1/25]
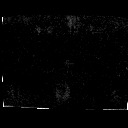

[Series 12: pre t1_twist_tra_dyn_ttc=7.5s · axial · non-contrast · 3.5mm · 0.83mm/px · 1 of 30 slices shown]
[im 1/30]
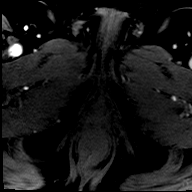

[Series 13: post t1_twist_tra_dyn-copy center · axial · 3.5mm · 0.83mm/px · 1 of 30 slices shown (1 of 24)]
[im 1/30]
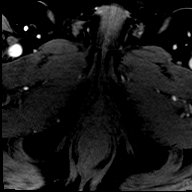

[Series 14: post t1_twist_tra_dyn-copy center · axial · 3.5mm · 0.83mm/px · 1 of 30 slices shown (2 of 24)]
[im 1/30]
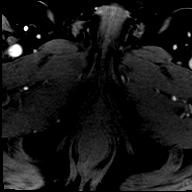

[Series 15: post t1_twist_tra_dyn-copy cent_sub_ttc=(id) · axial · 3.5mm · 0.83mm/px · 1 of 30 slices shown (1 of 22)]
[im 1/30]
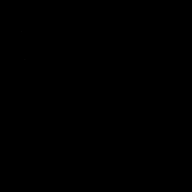

[Series 16: post t1_twist_tra_dyn-copy center · axial · 3.5mm · 0.83mm/px · 1 of 30 slices shown (3 of 24)]
[im 1/30]
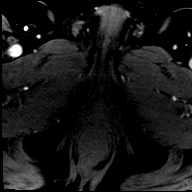

[Series 17: post t1_twist_tra_dyn-copy cent_sub_ttc=(id) · axial · 3.5mm · 0.83mm/px · 1 of 30 slices shown (2 of 22)]
[im 1/30]
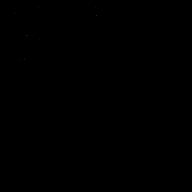

[Series 18: post t1_twist_tra_dyn-copy center · axial · 3.5mm · 0.83mm/px · 1 of 30 slices shown (4 of 24)]
[im 1/30]
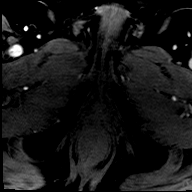

[Series 19: post t1_twist_tra_dyn-copy cent_sub_ttc=(id) · axial · 3.5mm · 0.83mm/px · 1 of 30 slices shown (3 of 22)]
[im 1/30]
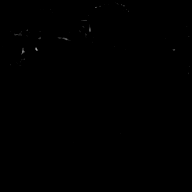

[Series 20: post t1_twist_tra_dyn-copy center · axial · 3.5mm · 0.83mm/px · 1 of 30 slices shown (5 of 24)]
[im 1/30]
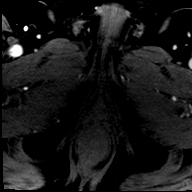

[Series 21: post t1_twist_tra_dyn-copy cent_sub_ttc=(id) · axial · 3.5mm · 0.83mm/px · 1 of 30 slices shown (4 of 22)]
[im 1/30]
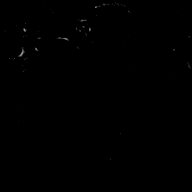

[Series 22: post t1_twist_tra_dyn-copy center · axial · 3.5mm · 0.83mm/px · 1 of 30 slices shown (6 of 24)]
[im 1/30]
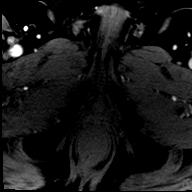

[Series 23: post t1_twist_tra_dyn-copy cent_sub_ttc=(id) · axial · 3.5mm · 0.83mm/px · 1 of 30 slices shown (5 of 22)]
[im 1/30]
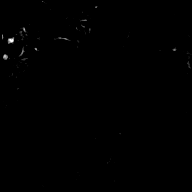

[Series 24: post t1_twist_tra_dyn-copy center · axial · 3.5mm · 0.83mm/px · 1 of 30 slices shown (7 of 24)]
[im 1/30]
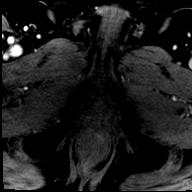

[Series 25: post t1_twist_tra_dyn-copy cent_sub_ttc=(id) · axial · 3.5mm · 0.83mm/px · 1 of 30 slices shown (6 of 22)]
[im 1/30]
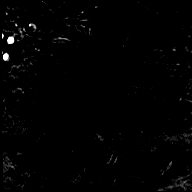

[Series 26: post t1_twist_tra_dyn-copy center · axial · 3.5mm · 0.83mm/px · 1 of 30 slices shown (8 of 24)]
[im 1/30]
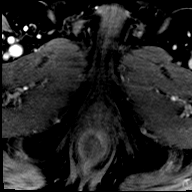

[Series 27: post t1_twist_tra_dyn-copy cent_sub_ttc=(id) · axial · 3.5mm · 0.83mm/px · 1 of 30 slices shown (7 of 22)]
[im 1/30]
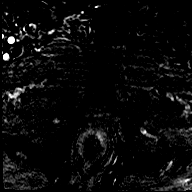

[Series 28: post t1_twist_tra_dyn-copy center · axial · 3.5mm · 0.83mm/px · 1 of 30 slices shown (9 of 24)]
[im 1/30]
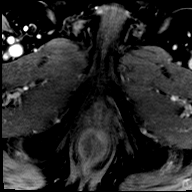

[Series 29: post t1_twist_tra_dyn-copy cent_sub_ttc=(id) · axial · 3.5mm · 0.83mm/px · 1 of 30 slices shown (8 of 22)]
[im 1/30]
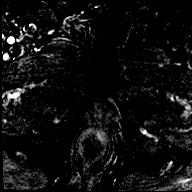

[Series 30: post t1_twist_tra_dyn-copy center · axial · 3.5mm · 0.83mm/px · 1 of 30 slices shown (10 of 24)]
[im 1/30]
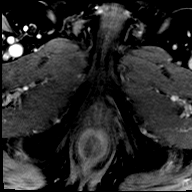

[Series 31: post t1_twist_tra_dyn-copy cent_sub_ttc=(id) · axial · 3.5mm · 0.83mm/px · 1 of 30 slices shown (9 of 22)]
[im 1/30]
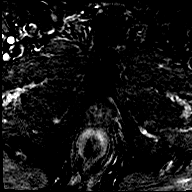

[Series 32: post t1_twist_tra_dyn-copy center · axial · 3.5mm · 0.83mm/px · 1 of 30 slices shown (11 of 24)]
[im 1/30]
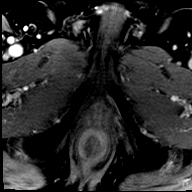

[Series 33: post t1_twist_tra_dyn-copy cent_sub_ttc=(id) · axial · 3.5mm · 0.83mm/px · 1 of 30 slices shown (10 of 22)]
[im 1/30]
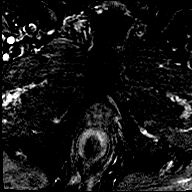

[Series 34: post t1_twist_tra_dyn-copy center · axial · 3.5mm · 0.83mm/px · 1 of 30 slices shown (12 of 24)]
[im 1/30]
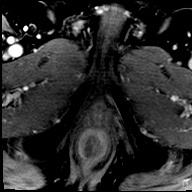

[Series 35: post t1_twist_tra_dyn-copy cent_sub_ttc=(id) · axial · 3.5mm · 0.83mm/px · 1 of 30 slices shown (11 of 22)]
[im 1/30]
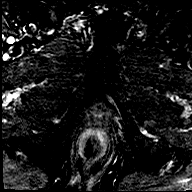

[Series 36: post t1_twist_tra_dyn-copy center · axial · 3.5mm · 0.83mm/px · 1 of 30 slices shown (13 of 24)]
[im 1/30]
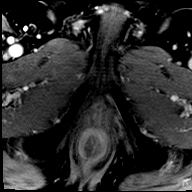

[Series 37: post t1_twist_tra_dyn-copy cent_sub_ttc=(id) · axial · 3.5mm · 0.83mm/px · 1 of 30 slices shown (12 of 22)]
[im 1/30]
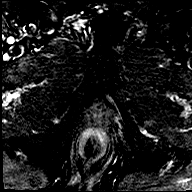

[Series 38: post t1_twist_tra_dyn-copy center · axial · 3.5mm · 0.83mm/px · 1 of 30 slices shown (14 of 24)]
[im 1/30]
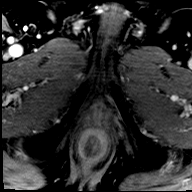

[Series 39: post t1_twist_tra_dyn-copy cent_sub_ttc=(id) · axial · 3.5mm · 0.83mm/px · 1 of 30 slices shown (13 of 22)]
[im 1/30]
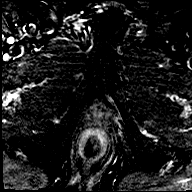

[Series 40: post t1_twist_tra_dyn-copy center · axial · 3.5mm · 0.83mm/px · 1 of 30 slices shown (15 of 24)]
[im 1/30]
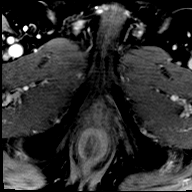

[Series 41: post t1_twist_tra_dyn-copy cent_sub_ttc=(id) · axial · 3.5mm · 0.83mm/px · 1 of 30 slices shown (14 of 22)]
[im 1/30]
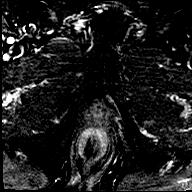

[Series 42: post t1_twist_tra_dyn-copy center · axial · 3.5mm · 0.83mm/px · 1 of 30 slices shown (16 of 24)]
[im 1/30]
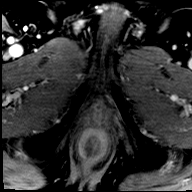

[Series 43: post t1_twist_tra_dyn-copy cent_sub_ttc=(id) · axial · 3.5mm · 0.83mm/px · 1 of 30 slices shown (15 of 22)]
[im 1/30]
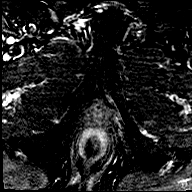

[Series 44: post t1_twist_tra_dyn-copy center · axial · 3.5mm · 0.83mm/px · 1 of 30 slices shown (17 of 24)]
[im 1/30]
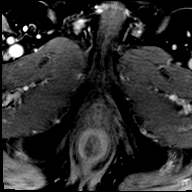

[Series 45: post t1_twist_tra_dyn-copy cent_sub_ttc=(id) · axial · 3.5mm · 0.83mm/px · 1 of 30 slices shown (16 of 22)]
[im 1/30]
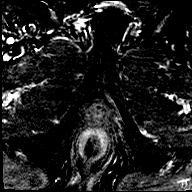

[Series 46: post t1_twist_tra_dyn-copy center · axial · 3.5mm · 0.83mm/px · 1 of 30 slices shown (18 of 24)]
[im 1/30]
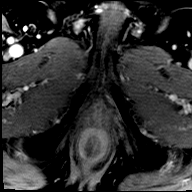

[Series 47: post t1_twist_tra_dyn-copy cent_sub_ttc=(id) · axial · 3.5mm · 0.83mm/px · 1 of 30 slices shown (17 of 22)]
[im 1/30]
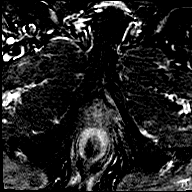

[Series 48: post t1_twist_tra_dyn-copy center · axial · 3.5mm · 0.83mm/px · 1 of 30 slices shown (19 of 24)]
[im 1/30]
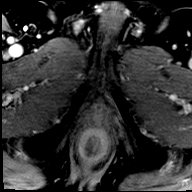

[Series 49: post t1_twist_tra_dyn-copy cent_sub_ttc=(id) · axial · 3.5mm · 0.83mm/px · 1 of 30 slices shown (18 of 22)]
[im 1/30]
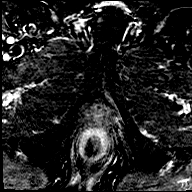

[Series 50: post t1_twist_tra_dyn-copy center · axial · 3.5mm · 0.83mm/px · 1 of 30 slices shown (20 of 24)]
[im 1/30]
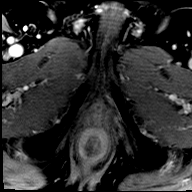

[Series 51: post t1_twist_tra_dyn-copy cent_sub_ttc=(id) · axial · 3.5mm · 0.83mm/px · 1 of 30 slices shown (19 of 22)]
[im 1/30]
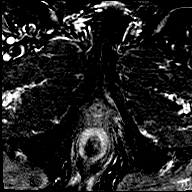

[Series 52: post t1_twist_tra_dyn-copy center · axial · 3.5mm · 0.83mm/px · 1 of 30 slices shown (21 of 24)]
[im 1/30]
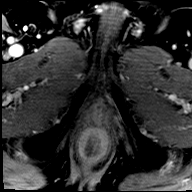

[Series 53: post t1_twist_tra_dyn-copy cent_sub_ttc=(id) · axial · 3.5mm · 0.83mm/px · 1 of 30 slices shown (20 of 22)]
[im 1/30]
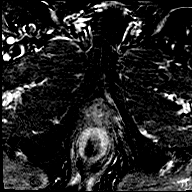

[Series 54: post t1_twist_tra_dyn-copy center · axial · 3.5mm · 0.83mm/px · 1 of 30 slices shown (22 of 24)]
[im 1/30]
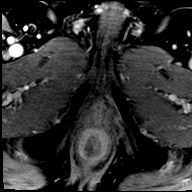

[Series 55: post t1_twist_tra_dyn-copy cent_sub_ttc=(id) · axial · 3.5mm · 0.83mm/px · 1 of 30 slices shown (21 of 22)]
[im 1/30]
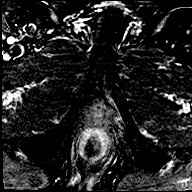

[Series 56: post t1_twist_tra_dyn-copy center · axial · 3.5mm · 0.83mm/px · 1 of 30 slices shown (23 of 24)]
[im 1/30]
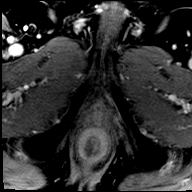

[Series 57: post t1_twist_tra_dyn-copy cent_sub_ttc=(id) · axial · 3.5mm · 0.83mm/px · 1 of 30 slices shown (22 of 22)]
[im 1/30]
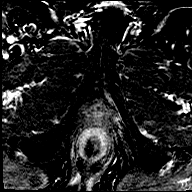

[Series 58: post t1_twist_tra_dyn-copy center · axial · 3.5mm · 0.83mm/px · 1 of 30 slices shown (24 of 24)]
[im 1/30]
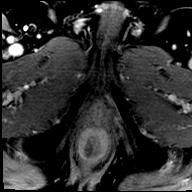

[56 of 56 positions shown; findings below may reference images not displayed]

FINDINGS: Prostate:

-- Peripheral Zone: Diffuse thinning and compression of peripheral
zone again seen due to marked central gland BPH. A T2 hypointense
nodule is seen in the left lateral mid gland which measures 1.5 x
1.0 cm and shows moderate ADC hypointensity, DWI hyperintensity, and
early focal contrast enhancement. This is new since previous study.

-- Transition/Central Zone: Marked enlargement with diffuse
involvement by numerous small BPH nodules. No suspicious central
gland nodules identified.

-- Measurements/Volume:  8.1 x 5.3 x 6.1 cm (volume = 140 cm^3)

Transcapsular spread: Above-described nodule shows focal capsular
bulge and irregular margins consistent with extracapsular extension.

Seminal vesicle involvement:  Absent

Neurovascular bundle involvement: Yes, above-described nodule shows
left-sided neurovascular bundle involvement

Pelvic adenopathy: None visualized

Bone metastasis: None visualized

Other:  Sigmoid diverticulosis, without evidence of diverticulitis.
IMPRESSION: 1.5 cm peripheral zone nodule in the left lateral mid gland, highly
suspicious for high-grade carcinoma. Left-sided extracapsular
extension and neurovascular bundle involvement also demonstrated.
PI-RADS 5: Very high (clinically significant cancer is highly likely
to be present).

No evidence of pelvic metastatic disease.

## 2021-09-25 ENCOUNTER — Other Ambulatory Visit: Payer: Self-pay | Admitting: Adult Health

## 2021-09-25 ENCOUNTER — Encounter: Payer: Self-pay | Admitting: Adult Health

## 2021-09-25 ENCOUNTER — Telehealth: Payer: Self-pay | Admitting: Adult Health

## 2021-09-25 DIAGNOSIS — K219 Gastro-esophageal reflux disease without esophagitis: Secondary | ICD-10-CM

## 2021-09-25 MED ORDER — FAMOTIDINE 20 MG PO TABS: ORAL_TABLET | ORAL | 1 refills | Status: DC

## 2021-09-25 NOTE — Telephone Encounter (Signed)
Pt is requesting refill on Famotidine, said Caryl Pina was transitioning him to this back in May. Wanting it sent to [facility] on [address]

## 2021-11-04 ENCOUNTER — Ambulatory Visit (INDEPENDENT_AMBULATORY_CARE_PROVIDER_SITE_OTHER): Payer: Medicare HMO | Admitting: Internal Medicine

## 2021-11-04 ENCOUNTER — Encounter: Payer: Self-pay | Admitting: Internal Medicine

## 2021-11-04 VITALS — BP 134/86 | HR 77 | Temp 97.8°F | Resp 17 | Ht 66.0 in | Wt 153.0 lb

## 2021-11-04 DIAGNOSIS — R7309 Other abnormal glucose: Secondary | ICD-10-CM | POA: Diagnosis not present

## 2021-11-04 DIAGNOSIS — E782 Mixed hyperlipidemia: Secondary | ICD-10-CM

## 2021-11-04 DIAGNOSIS — Z79899 Other long term (current) drug therapy: Secondary | ICD-10-CM | POA: Diagnosis not present

## 2021-11-04 DIAGNOSIS — I10 Essential (primary) hypertension: Secondary | ICD-10-CM | POA: Diagnosis not present

## 2021-11-04 DIAGNOSIS — E559 Vitamin D deficiency, unspecified: Secondary | ICD-10-CM | POA: Diagnosis not present

## 2021-11-04 DIAGNOSIS — I700 Atherosclerosis of aorta: Secondary | ICD-10-CM

## 2021-11-04 NOTE — Patient Instructions (Signed)

## 2021-11-04 NOTE — Progress Notes (Signed)
Future Appointments  Date Time Provider Department  11/04/2021            6 mo ov  11:30 AM Unk Pinto, MD [facility]  04/13/2022            cpe  2:00 PM Unk Pinto, MD [facility]  07/30/2022           wellness  11:00 AM Alycia Rossetti, NP [facility]           This very nice 88 y.o. WWM presents for 6 month f/u .  Patient has been followed for HTN, HLD, Prediabetes and Vitamin D Deficiency.  CT scan 2021 showed Aortic Atherosclerosis. Patient has Gleason 6 Prostate Ca  (2014) & is on finasteride followed by Dr Junious Silk.         HTN predates circa 2003. Patient's BP has been controlled at home.  Today's BP was initially sl elevated & rechecked at goal  - 134/86 . Patient denies any cardiac symptoms as chest pain, palpitations, shortness of breath, dizziness or ankle swelling.       Patient's hyperlipidemia is controlled with diet and ezetimibe . Patient denies myalgias or other medication SE's. Last lipids were at goal except elevated Trig's :  Lab Results  Component Value Date   CHOL 169 07/29/2021   HDL 37 (L) 07/29/2021   LDLCALC 99 07/29/2021   TRIG 210 (H) 07/29/2021   CHOLHDL 4.6 07/29/2021         Patient has hx/o prediabetes (A1c 5.8% /2008) and patient denies reactive hypoglycemic symptoms, visual blurring, diabetic polys or paresthesias.  A1c is at  goal :                                                                                    Lab Results  Component Value Date   HGBA1C 5.5 11/04/2021                       Finally, patient has history of Vitamin D Deficiency ("45 /2008) and last vitamin D was at goal :   Lab Results  Component Value Date   VD25OH 74 04/08/2021        Current Outpatient Medications:    acetaminophen (TYLENOL) 500 MG tablet, Take 500 mg by mouth as needed (prn). , Disp: , Rfl:    Alpha-Lipoic Acid 50 MG CAPS, Take 1 tablet by mouth daily., Disp: , Rfl:    aspirin 81 MG EC tablet, Take by mouth., Disp: , Rfl:     Cholecalciferol (VITAMIN D PO), Take 2,000 Int'l Units by mouth 2 (two) times daily. , Disp: , Rfl:    Cyanocobalamin (VITAMIN B12 PO), Take by mouth daily., Disp: , Rfl:    famotidine (PEPCID) 20 MG tablet, Take 1 tab prior to breakfast and bedtime as needed for reflux., Disp: 180 tablet, Rfl: 1   Flaxseed, Linseed, (FLAX SEED OIL PO), Take 1,200 mg by mouth 3 (three) times daily. , Disp: , Rfl:    Magnesium 250 MG TABS, Take 250 mg by mouth daily., Disp: , Rfl:    metoprolol tartrate (LOPRESSOR) 50 MG tablet, Take 1 tablet (50  mg total) by mouth 2 (two) times daily., Disp: 180 tablet, Rfl: 3   Omega-3 Fatty Acids (FISH OIL PO), Take 1,200 mg by mouth 2 (two) times daily. , Disp: , Rfl:    pantoprazole (PROTONIX) 20 MG tablet, Take 1 tablet Daily  to Prevent Heartburn & Indigestion., Disp: 90 tablet, Rfl: 3   Allergies  Allergen Reactions   Pravastatin    Red Yeast Rice [Cholestin]    Sulfa Antibiotics    Vibramycin [Doxycycline Calcium]    Zetia [Ezetimibe]      Past Medical History:  Diagnosis Date   Essential hypertension 02/15/2007   Hyperlipidemia 07/13/2013   Prediabetes 07/13/2013   Prostate cancer ([place])    Pulmonary nodule (2008 resolved on f/u)  02/15/2007   Vitamin D deficiency 07/13/2013     Health Maintenance  Topic Date Due   COVID-19 Vaccine (3 - Moderna risk series) 03/26/2020   TETANUS/TDAP  05/16/2025   Pneumonia Vaccine 54+ Years old  Completed   INFLUENZA VACCINE  Completed   Zoster Vaccines- Shingrix  Completed   HPV VACCINES  Aged Out     Immunization History  Administered Date(s) Administered   DT (Pediatric) 05/17/2015   Influenza, High Dose 12/01/2017, 01/05/2019, 01/29/2020, 12/27/2020   Moderna SARS-COV2 Booster Vacc 02/27/2020   Moderna Sars-Covid-2 Vacc 05/01/2019, 05/30/2019   Pneumococcal -13 10/12/2013   Pneumococcal -23 05/17/2015   Zoster Recombinat (Shingrix) 08/13/2017, 10/14/2017    Last Colon -  03/2002 - Dr Deatra Ina - and aged  out   Past Surgical History:  Procedure Laterality Date   PROSTATE BIOPSY  2020   PROSTATE BIOPSY  2014   TONSILECTOMY/ADENOIDECTOMY WITH MYRINGOTOMY     TREATMENT FISTULA ANAL       Family History  Problem Relation Age of Onset   Hyperlipidemia Brother    Hypertension Brother    Breast cancer Neg Hx    Colon cancer Neg Hx    Prostate cancer Neg Hx    Pancreatic cancer Neg Hx      Social History   Tobacco Use   Smoking status: Never   Smokeless tobacco: Never  Vaping Use   Vaping Use: Never used  Substance Use Topics   Alcohol use: No   Drug use: Never      ROS Constitutional: Denies fever, chills, weight loss/gain, headaches, insomnia,  night sweats or change in appetite. Does c/o fatigue. Eyes: Denies redness, blurred vision, diplopia, discharge, itchy or watery eyes.  ENT: Denies discharge, congestion, post nasal drip, epistaxis, sore throat, earache, hearing loss, dental pain, Tinnitus, Vertigo, Sinus pain or snoring.  Cardio: Denies chest pain, palpitations, irregular heartbeat, syncope, dyspnea, diaphoresis, orthopnea, PND, claudication or edema Respiratory: denies cough, dyspnea, DOE, pleurisy, hoarseness, laryngitis or wheezing.  Gastrointestinal: Denies dysphagia, heartburn, reflux, water brash, pain, cramps, nausea, vomiting, bloating, diarrhea, constipation, hematemesis, melena, hematochezia, jaundice or hemorrhoids Genitourinary: Denies dysuria, frequency, urgency, nocturia, hesitancy, discharge, hematuria or flank pain Musculoskeletal: Denies arthralgia, myalgia, stiffness, Jt. Swelling, pain, limp or strain/sprain. Denies Falls. Skin: Denies puritis, rash, hives, warts, acne, eczema or change in skin lesion Neuro: No weakness, tremor, incoordination, spasms, paresthesia or pain Psychiatric: Denies confusion, memory loss or sensory loss. Denies Depression. Endocrine: Denies change in weight, skin, hair change, nocturia, and paresthesia, diabetic polys,  visual blurring or hyper / hypo glycemic episodes.  Heme/Lymph: No excessive bleeding, bruising or enlarged lymph nodes.   Physical Exam  BP 134/86   Pulse 77   Temp 97.8 F (36.6 C)  Resp 17   Ht '5\' 6"'$  (1.676 m)   Wt 153 lb (69.4 kg)   SpO2 99%   BMI 24.69 kg/m   General Appearance: Well nourished and well groomed and in no apparent distress.  Eyes: PERRLA, EOMs, conjunctiva no swelling or erythema, normal fundi and vessels. Sinuses: No frontal/maxillary tenderness ENT/Mouth: EACs patent / TMs  nl. Nares clear without erythema, swelling, mucoid exudates. Oral hygiene is good. No erythema, swelling, or exudate. Tongue normal, non-obstructing. Tonsils not swollen or erythematous. Hearing normal.  Neck: Supple, thyroid not palpable. No bruits, nodes or JVD. Respiratory: Respiratory effort normal.  BS equal and clear bilateral without rales, rhonci, wheezing or stridor. Cardio: Heart sounds are normal with regular rate and rhythm and no murmurs, rubs or gallops. Peripheral pulses are normal and equal bilaterally without edema. No aortic or femoral bruits. Chest: symmetric with normal excursions and percussion.  Abdomen: Soft, with Nl bowel sounds. Nontender, no guarding, rebound, hernias, masses, or organomegaly.  Lymphatics: Non tender without lymphadenopathy.  Musculoskeletal: Full ROM all peripheral extremities, joint stability, 5/5 strength, and normal gait. Skin: Warm and dry without rashes, lesions, cyanosis, clubbing or  ecchymosis.  Neuro: Cranial nerves intact, reflexes equal bilaterally. Normal muscle tone, no cerebellar symptoms. Sensation intact.  Pysch: Alert and oriented X 3 with normal affect, insight and judgment appropriate.   Assessment and Plan  1. Essential hypertension  - CBC with Differential/Platelet - COMPLETE METABOLIC PANEL WITH GFR - Magnesium - TSH  2. Hyperlipidemia, mixed  - Lipid panel - TSH  3. Abnormal glucose  - Hemoglobin A1c -  Insulin, random  4. Vitamin D deficiency  - VITAMIN D 25 Hydroxy   5. Aortic atherosclerosis ([place]) by CT scan 2021  - Lipid panel  6. Medication management  - CBC with Differential/Platelet - COMPLETE METABOLIC PANEL WITH GFR - Magnesium - Lipid panel - TSH - Hemoglobin A1c - Insulin, random - VITAMIN D 25 Hydroxy         Patient was counseled in prudent diet, weight control to achieve/maintain BMI less than 25, BP monitoring, regular exercise and medications as discussed.  Discussed med effects and SE's. Routine screening labs and tests as requested with regular follow-up as recommended. Over 40 minutes of exam, counseling, chart review and high complex critical decision making was performed   Kirtland Bouchard, MD

## 2021-11-05 LAB — CBC WITH DIFFERENTIAL/PLATELET
Absolute Monocytes: 475 cells/uL (ref 200–950)
Basophils Absolute: 38 cells/uL (ref 0–200)
Basophils Relative: 0.8 %
Eosinophils Absolute: 38 cells/uL (ref 15–500)
Eosinophils Relative: 0.8 %
HCT: 47.3 % (ref 38.5–50.0)
Hemoglobin: 16.5 g/dL (ref 13.2–17.1)
Lymphs Abs: 653 cells/uL — ABNORMAL LOW (ref 850–3900)
MCH: 30.6 pg (ref 27.0–33.0)
MCHC: 34.9 g/dL (ref 32.0–36.0)
MCV: 87.8 fL (ref 80.0–100.0)
MPV: 11.7 fL (ref 7.5–12.5)
Monocytes Relative: 10.1 %
Neutro Abs: 3497 cells/uL (ref 1500–7800)
Neutrophils Relative %: 74.4 %
Platelets: 160 10*3/uL (ref 140–400)
RBC: 5.39 10*6/uL (ref 4.20–5.80)
RDW: 12.3 % (ref 11.0–15.0)
Total Lymphocyte: 13.9 %
WBC: 4.7 10*3/uL (ref 3.8–10.8)

## 2021-11-05 LAB — LIPID PANEL
Cholesterol: 160 mg/dL (ref <200)
HDL: 39 mg/dL — ABNORMAL LOW (ref >=40)
LDL Cholesterol (Calc): 94 mg/dL (calc)
Non-HDL Cholesterol (Calc): 121 mg/dL (calc) (ref <130)
Total CHOL/HDL Ratio: 4.1 (calc) (ref <5.0)
Triglycerides: 177 mg/dL — ABNORMAL HIGH (ref <150)

## 2021-11-05 LAB — INSULIN, RANDOM: Insulin: 17.3 u[IU]/mL

## 2021-11-05 LAB — COMPLETE METABOLIC PANEL WITH GFR
AG Ratio: 2 (calc) (ref 1.0–2.5)
ALT: 26 U/L (ref 9–46)
AST: 20 U/L (ref 10–35)
Albumin: 4.5 g/dL (ref 3.6–5.1)
Alkaline phosphatase (APISO): 93 U/L (ref 35–144)
BUN: 17 mg/dL (ref 7–25)
CO2: 25 mmol/L (ref 20–32)
Calcium: 9.9 mg/dL (ref 8.6–10.3)
Chloride: 106 mmol/L (ref 98–110)
Creat: 1.1 mg/dL (ref 0.70–1.22)
Globulin: 2.3 g/dL (calc) (ref 1.9–3.7)
Glucose, Bld: 88 mg/dL (ref 65–99)
Potassium: 4.5 mmol/L (ref 3.5–5.3)
Sodium: 141 mmol/L (ref 135–146)
Total Bilirubin: 0.7 mg/dL (ref 0.2–1.2)
Total Protein: 6.8 g/dL (ref 6.1–8.1)
eGFR: 65 mL/min/{1.73_m2} (ref >=60)

## 2021-11-05 LAB — VITAMIN D 25 HYDROXY (VIT D DEFICIENCY, FRACTURES): Vit D, 25-Hydroxy: 92 ng/mL (ref 30–100)

## 2021-11-05 LAB — TSH: TSH: 1.15 mIU/L (ref 0.40–4.50)

## 2021-11-05 LAB — HEMOGLOBIN A1C
Hgb A1c MFr Bld: 5.5 % of total Hgb (ref <5.7)
Mean Plasma Glucose: 111 mg/dL
eAG (mmol/L): 6.2 mmol/L

## 2021-11-05 LAB — MAGNESIUM: Magnesium: 2.2 mg/dL (ref 1.5–2.5)

## 2021-11-05 NOTE — Progress Notes (Signed)
<><><><><><><><><><><><><><><><><><><><><><><><><><><><><><><><><> <><><><><><><><><><><><><><><><><><><><><><><><><><><><><><><><><> -   Test results slightly outside the reference range are not unusual. If there is anything important, I will review this with you,  otherwise it is considered normal test values.  If you have further questions,  please do not hesitate to contact me at the office or via My Chart.  <><><><><><><><><><><><><><><><><><><><><><><><><><><><><><><><><> <><><><><><><><><><><><><><><><><><><><><><><><><><><><><><><><><>  -  Total Chol = 160   & LDL Chol = 94   - Both  Excellent   - Very low risk for Heart Attack  / Stroke <><><><><><><><><><><><><><><><><><><><><><><><><><><><><><><><><> <><><><><><><><><><><><><><><><><><><><><><><><><><><><><><><><><>  - A1c back to Normal   nonDiabetic  range - Great !  <><><><><><><><><><><><><><><><><><><><><><><><><><><><><><><><><>  -Vitamin D = 92 - Excellent - please keep dose same  <><><><><><><><><><><><><><><><><><><><><><><><><><><><><><><><><>  - All Else - CBC - Kidneys - Electrolytes - Liver - Magnesium & Thyroid    - all  Normal / OK <><><><><><><><><><><><><><><><><><><><><><><><><><><><><><><><><> <><><><><><><><><><><><><><><><><><><><><><><><><><><><><><><><><>

## 2021-11-07 ENCOUNTER — Encounter: Payer: Self-pay | Admitting: Internal Medicine

## 2021-11-13 DIAGNOSIS — Z8546 Personal history of malignant neoplasm of prostate: Secondary | ICD-10-CM | POA: Diagnosis not present

## 2021-11-20 DIAGNOSIS — R351 Nocturia: Secondary | ICD-10-CM | POA: Diagnosis not present

## 2021-11-20 DIAGNOSIS — Z8546 Personal history of malignant neoplasm of prostate: Secondary | ICD-10-CM | POA: Diagnosis not present

## 2021-11-26 ENCOUNTER — Ambulatory Visit: Payer: Medicare HMO | Admitting: Adult Health

## 2021-12-22 IMAGING — NM NM BONE WHOLE BODY
2 series · 2 of 2 positions shown · non-contrast
Comparison: No recent comparison, only PET CT from 0221.

CLINICAL DATA: History of prostate cancer with rising PSA a

EXAM:
NUCLEAR MEDICINE WHOLE BODY BONE SCAN
TECHNIQUE: Whole body anterior and posterior images were obtained approximately
3 hours after intravenous injection of radiopharmaceutical.
RADIOPHARMACEUTICALS:  22 mCi 6echnetium-NNm MDP IV

[Series 1: wbr_bone_40 whole body · 2.66mm/px · 1 of 1 slices shown (1 of 2)]
[im 1/1]
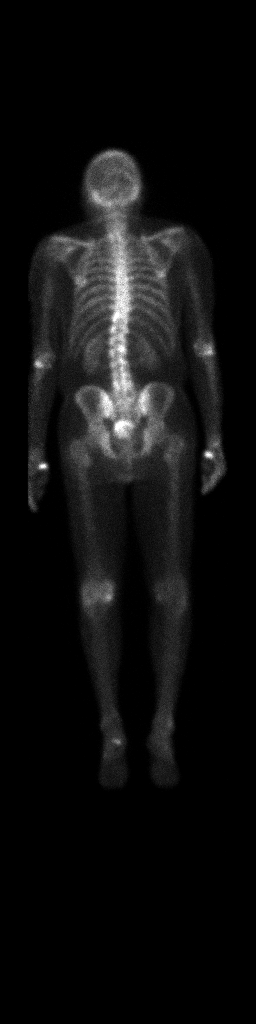

[Series 1: wbr_bone_40 whole body · 2.66mm/px · 1 of 1 slices shown (2 of 2)]
[im 1/1]
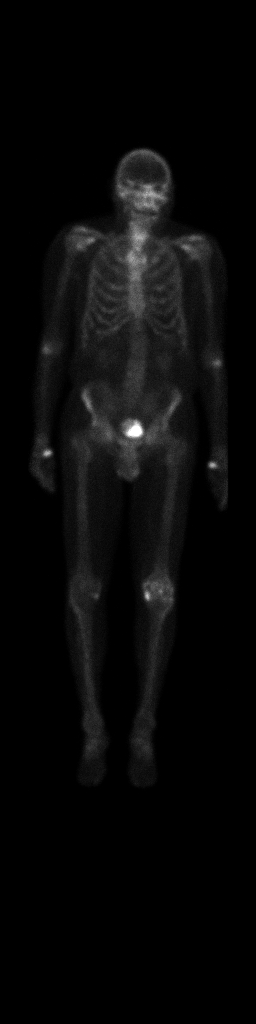

[2 of 2 positions shown; findings below may reference images not displayed]

FINDINGS: Increased activity about the elbow owes, first carpometacarpal
joints, shoulders and bilateral knees, most suggestive of
degenerative processes.

No definite scintigraphic evidence of bony metastatic disease. Areas
in the posterior spine likely degenerative in the setting of
levoconvex spinal curvature.

Expected activity in the urinary tract.
IMPRESSION: 1. No scintigraphic evidence of osseous metastatic disease.

## 2021-12-24 ENCOUNTER — Ambulatory Visit (INDEPENDENT_AMBULATORY_CARE_PROVIDER_SITE_OTHER): Payer: Medicare HMO

## 2021-12-24 VITALS — Temp 98.6°F

## 2021-12-24 DIAGNOSIS — Z23 Encounter for immunization: Secondary | ICD-10-CM | POA: Diagnosis not present

## 2022-01-05 ENCOUNTER — Encounter: Payer: Self-pay | Admitting: Internal Medicine

## 2022-01-13 ENCOUNTER — Other Ambulatory Visit: Payer: Self-pay | Admitting: Internal Medicine

## 2022-01-13 DIAGNOSIS — R000 Tachycardia, unspecified: Secondary | ICD-10-CM

## 2022-01-13 MED ORDER — METOPROLOL TARTRATE 50 MG PO TABS: ORAL_TABLET | ORAL | 3 refills | Status: DC

## 2022-02-09 NOTE — Progress Notes (Unsigned)
3 MONTH FOLLOW UP Assessment:    Aortic atherosclerosis ([place]) Per CT 03/2019 Control blood pressure, cholesterol, glucose, increase exercise.   Essential hypertension Continue medication, labile but mostly good control on home logs Some dizziness; suggested check BP prior to taking metoprolol, do 1/2 tab if <110/70 Monitor blood pressure at home; call if consistently over 140/80 Continue DASH diet.   Reminder to go to the ER if any CP, SOB, nausea, dizziness, severe HA, changes vision/speech, left arm numbness and tingling and jaw pain.  Pulmonary sarcoidosis (1988)  Followed by pulmonology PRN  Prostate cancer [facility])  Managed by Dr. Junious Silk, s/p radiation, ongoing eligard  Vitamin D deficiency At goal at last check; continue to recommend supplementation for goal of 60-100 Defer vitamin D level to CPE  Other abnormal glucose Recent A1Cs at goal Discussed diet/exercise, weight management  Defer A1C;  GERD Was weaned off pantoprazole and placed on Famotidine BID but has started to have return of gnawing/burning Restart Pantoprazole 20 mg QAM and continue Famotidine at night Monitor symptoms and if worsen notify the office  Medication management CBC, CMP/GFR, magnesium  Hyperlipidemia No longer treated due to age Mild elevations monitored Continue cholesterol diet/exercise Check lipid panel   BMI 24 Continue to recommend diet heavy in fruits and veggies and low in animal meats, cheeses, and dairy products, appropriate calorie intake Discuss exercise recommendations routinely Continue to monitor weight at each visit  Sciatica/left foot drop Continue ankle brace, home exercises   Over 30 minutes of exam, counseling, chart review, and critical decision making was performed  Future Appointments  Date Time Provider [place]  06/08/2022  2:00 PM Unk Pinto, MD [facility] None  07/30/2022 11:00 AM Alycia Rossetti, NP [facility] None      Subjective:   Matthew Aguirre is a 88 y.o. male who presents for 3 month follow up for HTN, hyperlipidemia, glucose management, and vitamin D Def.   He has been having gnawing/burning sensation in his stomach - previously on pantoprazole but was tapered to Famotidine twice a day beginning in 07/2021. The symptoms have progressively worsened. Can not relate symptoms to food.   He continues to have left foot drop and numbness and in left foot.  Wearing a foot/ankle brace. Previously did PT, uses a cane.   Patient has hx/o Prostate Cancer (2014 ) followed on Finasteride, has been doing active surveillance per Dr Junious Silk, in 2020 PSA jumped up to 9.88, was referred to Dr. Tresa Moore and underwent MRI on 12/02/2018 which found gleason 8 cancer, underwent biopsy on 03/08/2019. PET scan 03/29/2019 showed no mets. He was initiated on Eligard in Feb 2021, had prostate markers placed 4/6 and underwent external radiation, 40 treatments. He reports mild night sweats and fatigue with initiation of eligard, does manage fairly well  BMI is Body mass index is 24.82 kg/m., he has been working on diet and exercise, walks/strength exercises daily.  Wt Readings from Last 3 Encounters:  02/10/22 153 lb 12.8 oz (69.8 kg)  11/04/21 153 lb (69.4 kg)  07/29/21 157 lb 12.8 oz (71.6 kg)   Ct abd/pelvis in 03/2019 showed aortic atherosclerosis His blood pressure has been controlled at home (Checks BP regularly and has new machine and running 110-130/60-70, today their BP is BP: (!) 162/80.  BP Readings from Last 3 Encounters:  02/10/22 (!) 162/80  11/04/21 134/86  07/29/21 (!) 142/84  He does workout. He denies chest pain, shortness of breath. Does endorse some afternoon fatigue and dizziness with abrupt  position changes. Taking lopressor 50 mg BID.   He is not on cholesterol medication secondary to age. Hx of intolerance of RYRS, welcol, zetia, pravastatin. Currently taking fish oil. His LDL cholesterol is at goal. The cholesterol last  visit was:   Lab Results  Component Value Date   CHOL 160 11/04/2021   HDL 39 (L) 11/04/2021   LDLCALC 94 11/04/2021   TRIG 177 (H) 11/04/2021   CHOLHDL 4.1 11/04/2021   He has been working on diet and exercise for glucose management, and denies foot ulcerations, increased appetite, nausea, paresthesia of the feet, polydipsia, polyuria, visual disturbances, vomiting and weight loss. Last A1C in the office was:  Lab Results  Component Value Date   HGBA1C 5.5 11/04/2021   Last GFR Lab Results  Component Value Date   EGFR 65 11/04/2021     Patient is on Vitamin D supplement and at goal:    Lab Results  Component Value Date   VD25OH 92 11/04/2021        Medication Review:   Current Outpatient Medications (Cardiovascular):    metoprolol tartrate (LOPRESSOR) 50 MG tablet, Take  1 tablet  2 x / day (every 12 hours) for BP                                                                              /                                   take                                 by                                    mouth   Current Outpatient Medications (Analgesics):    acetaminophen (TYLENOL) 500 MG tablet, Take 500 mg by mouth as needed (prn).    aspirin 81 MG EC tablet, Take by mouth.  Current Outpatient Medications (Hematological):    Cyanocobalamin (VITAMIN B12 PO), Take by mouth daily.  Current Outpatient Medications (Other):    Cholecalciferol (VITAMIN D PO), Take 2,000 Int'l Units by mouth 2 (two) times daily.    famotidine (PEPCID) 20 MG tablet, Take 1 tab prior to breakfast and bedtime as needed for reflux.   Flaxseed, Linseed, (FLAX SEED OIL PO), Take 1,200 mg by mouth 3 (three) times daily.    Magnesium 250 MG TABS, Take 250 mg by mouth daily.   Omega-3 Fatty Acids (FISH OIL PO), Take 1,200 mg by mouth 2 (two) times daily.    pantoprazole (PROTONIX) 20 MG tablet, Take 1 tablet (20 mg total) by mouth daily.   Alpha-Lipoic Acid 50 MG CAPS, Take 1 tablet by mouth daily.  (Patient not taking: Reported on 11/04/2021)  Allergies: Allergies  Allergen Reactions   Pravastatin    Red Yeast Rice [Cholestin]    Sulfa Antibiotics    Vibramycin [Doxycycline Calcium]    Zetia [Ezetimibe]  Current Problems (verified) has Essential hypertension; Hyperlipidemia; Vitamin D deficiency; Medication management; Malignant neoplasm of prostate ([place]); Pulmonary sarcoidosis (1988) ; Other abnormal glucose; BMI 25.0-25.9,adult; Plantar fasciitis of left foot; Gastroesophageal reflux disease without esophagitis; Aortic atherosclerosis ([place]) by CT scan 2021; Left foot drop; and Radiation-induced lumbosacral plexopathy on their problem list.   Surgical: He  has a past surgical history that includes Treatment fistula anal; Tonsilectomy/adenoidectomy with myringotomy; Prostate biopsy (2020); and Prostate biopsy (2014). Family His family history includes Hyperlipidemia in his brother; Hypertension in his brother. Social history  He reports that he has never smoked. He has never used smokeless tobacco. He reports that he does not drink alcohol and does not use drugs.   Review of Systems  Constitutional:  Negative for malaise/fatigue (mid afternoon fatigue with eligard) and weight loss.  HENT:  Negative for hearing loss and tinnitus.        Fullness of right ear  Eyes:  Negative for blurred vision and double vision.  Respiratory:  Negative for cough, shortness of breath and wheezing.   Cardiovascular:  Negative for chest pain, palpitations, orthopnea, claudication and leg swelling.  Gastrointestinal:  Negative for abdominal pain, blood in stool, constipation, diarrhea, heartburn, melena, nausea and vomiting.  Genitourinary: Negative.   Musculoskeletal:  Negative for joint pain and myalgias.  Skin:  Negative for rash.  Neurological:  Positive for focal weakness (left foot drop, now in brace). Negative for dizziness, tingling, sensory change, weakness and headaches.   Endo/Heme/Allergies:  Negative for polydipsia.  Psychiatric/Behavioral: Negative.    All other systems reviewed and are negative.    Objective:   Today's Vitals   02/10/22 1129  BP: (!) 162/80  Pulse: 73  Temp: 97.9 F (36.6 C)  SpO2: 98%  Weight: 153 lb 12.8 oz (69.8 kg)  Height: _0  (1.676 m)   Body mass index is 24.82 kg/m.  General appearance: alert, no distress, WD/WN, male HEENT: normocephalic, sclerae anicteric, TMs pearly, nares patent, no discharge or erythema, pharynx normal Oral cavity: MMM, no lesions Neck: supple, no lymphadenopathy, no thyromegaly, no masses Heart: RRR, normal S1, S2, no murmurs Lungs: CTA bilaterally, no wheezes, rhonchi, or rales Abdomen: +bs, soft, non tender, non distended, no masses, no hepatomegaly, no splenomegaly Musculoskeletal: nontender, no swelling, no obvious deformity. Neg straight leg raise. Mild kyphosis.  Extremities: no edema, no cyanosis, no clubbing Pulses: 2+ symmetric, upper and lower extremities, normal cap refill Neurological: alert, oriented x 3, CN2-12 intact, strength normal upper extremities and lower extremities excepting left foot flexion, sensation normal throughout, DTRs 2+ upper extremities and patellar, no cerebellar signs, mildlly antalgic gait with cane.  Psychiatric: normal affect, behavior normal, pleasant     Alycia Rossetti, NP   02/10/2022

## 2022-02-10 ENCOUNTER — Ambulatory Visit (INDEPENDENT_AMBULATORY_CARE_PROVIDER_SITE_OTHER): Payer: Medicare HMO | Admitting: Nurse Practitioner

## 2022-02-10 ENCOUNTER — Encounter: Payer: Self-pay | Admitting: Nurse Practitioner

## 2022-02-10 VITALS — BP 162/80 | HR 73 | Temp 97.9°F | Ht 66.0 in | Wt 153.8 lb

## 2022-02-10 DIAGNOSIS — C61 Malignant neoplasm of prostate: Secondary | ICD-10-CM

## 2022-02-10 DIAGNOSIS — K219 Gastro-esophageal reflux disease without esophagitis: Secondary | ICD-10-CM | POA: Diagnosis not present

## 2022-02-10 DIAGNOSIS — D860 Sarcoidosis of lung: Secondary | ICD-10-CM

## 2022-02-10 DIAGNOSIS — Z6825 Body mass index (BMI) 25.0-25.9, adult: Secondary | ICD-10-CM

## 2022-02-10 DIAGNOSIS — I10 Essential (primary) hypertension: Secondary | ICD-10-CM

## 2022-02-10 DIAGNOSIS — I700 Atherosclerosis of aorta: Secondary | ICD-10-CM | POA: Diagnosis not present

## 2022-02-10 DIAGNOSIS — M21372 Foot drop, left foot: Secondary | ICD-10-CM

## 2022-02-10 DIAGNOSIS — E782 Mixed hyperlipidemia: Secondary | ICD-10-CM | POA: Diagnosis not present

## 2022-02-10 DIAGNOSIS — R7309 Other abnormal glucose: Secondary | ICD-10-CM | POA: Diagnosis not present

## 2022-02-10 DIAGNOSIS — Z79899 Other long term (current) drug therapy: Secondary | ICD-10-CM | POA: Diagnosis not present

## 2022-02-10 DIAGNOSIS — E559 Vitamin D deficiency, unspecified: Secondary | ICD-10-CM

## 2022-02-10 MED ORDER — PANTOPRAZOLE SODIUM 20 MG PO TBEC: 20.0000 mg | DELAYED_RELEASE_TABLET | Freq: Every day | ORAL | 1 refills | Status: DC

## 2022-02-10 NOTE — Patient Instructions (Signed)
Mucinex as needed if feel fullness or crackling in ears.   Eustachian Tube Dysfunction  Eustachian tube dysfunction refers to a condition in which a blockage develops in the narrow passage that connects the middle ear to the back of the nose (eustachian tube). The eustachian tube regulates air pressure in the middle ear by letting air move between the ear and nose. It also helps to drain fluid from the middle ear space. Eustachian tube dysfunction can affect one or both ears. When the eustachian tube does not function properly, air pressure, fluid, or both can build up in the middle ear. What are the causes? This condition occurs when the eustachian tube becomes blocked or cannot open normally. Common causes of this condition include: Ear infections. Colds and other infections that affect the nose, mouth, and throat (upper respiratory tract). Allergies. Irritation from cigarette smoke. Irritation from stomach acid coming up into the esophagus (gastroesophageal reflux). The esophagus is the part of the body that moves food from the mouth to the stomach. Sudden changes in air pressure, such as from descending in an airplane or scuba diving. Abnormal growths in the nose or throat, such as: Growths that line the nose (nasal polyps). Abnormal growth of cells (tumors). Enlarged tissue at the back of the throat (adenoids). What increases the risk? You are more likely to develop this condition if: You smoke. You are overweight. You are a child who has: Certain birth defects of the mouth, such as cleft palate. Large tonsils or adenoids. What are the signs or symptoms? Common symptoms of this condition include: A feeling of fullness in the ear. Ear pain. Clicking or popping noises in the ear. Ringing in the ear (tinnitus). Hearing loss. Loss of balance. Dizziness. Symptoms may get worse when the air pressure around you changes, such as when you travel to an area of high elevation, fly on an  airplane, or go scuba diving. How is this diagnosed? This condition may be diagnosed based on: Your symptoms. A physical exam of your ears, nose, and throat. Tests, such as those that measure: The movement of your eardrum. Your hearing (audiometry). How is this treated? Treatment depends on the cause and severity of your condition. In mild cases, you may relieve your symptoms by moving air into your ears. This is called "popping the ears." In more severe cases, or if you have symptoms of fluid in your ears, treatment may include: Medicines to relieve congestion (decongestants). Medicines that treat allergies (antihistamines). Nasal sprays or ear drops that contain medicines that reduce swelling (steroids). A procedure to drain the fluid in your eardrum. In this procedure, a small tube may be placed in the eardrum to: Drain the fluid. Restore the air in the middle ear space. A procedure to insert a balloon device through the nose to inflate the opening of the eustachian tube (balloon dilation). Follow these instructions at home: Lifestyle Do not do any of the following until your health care provider approves: Travel to high altitudes. Fly in airplanes. Work in a Pension scheme manager or room. Scuba dive. Do not use any products that contain nicotine or tobacco. These products include cigarettes, chewing tobacco, and vaping devices, such as e-cigarettes. If you need help quitting, ask your health care provider. Keep your ears dry. Wear fitted earplugs during showering and bathing. Dry your ears completely after. General instructions Take over-the-counter and prescription medicines only as told by your health care provider. Use techniques to help pop your ears as recommended by your  health care provider. These may include: Chewing gum. Yawning. Frequent, forceful swallowing. Closing your mouth, holding your nose closed, and gently blowing as if you are trying to blow air out of your  nose. Keep all follow-up visits. This is important. Contact a health care provider if: Your symptoms do not go away after treatment. Your symptoms come back after treatment. You are unable to pop your ears. You have: A fever. Pain in your ear. Pain in your head or neck. Fluid draining from your ear. Your hearing suddenly changes. You become very dizzy. You lose your balance. Get help right away if: You have a sudden, severe increase in any of your symptoms. Summary Eustachian tube dysfunction refers to a condition in which a blockage develops in the eustachian tube. It can be caused by ear infections, allergies, inhaled irritants, or abnormal growths in the nose or throat. Symptoms may include ear pain or fullness, hearing loss, or ringing in the ears. Mild cases are treated with techniques to unblock the ears, such as yawning or chewing gum. More severe cases are treated with medicines or procedures. This information is not intended to replace advice given to you by your health care provider. Make sure you discuss any questions you have with your health care provider. Document Revised: 05/13/2020 Document Reviewed: 05/13/2020 Elsevier Patient Education  [place].

## 2022-02-11 LAB — CBC WITH DIFFERENTIAL/PLATELET
Absolute Monocytes: 606 cells/uL (ref 200–950)
Basophils Absolute: 28 cells/uL (ref 0–200)
Basophils Relative: 0.6 %
Eosinophils Absolute: 38 cells/uL (ref 15–500)
Eosinophils Relative: 0.8 %
HCT: 48.9 % (ref 38.5–50.0)
Hemoglobin: 16.4 g/dL (ref 13.2–17.1)
Lymphs Abs: 606 cells/uL — ABNORMAL LOW (ref 850–3900)
MCH: 29.8 pg (ref 27.0–33.0)
MCHC: 33.5 g/dL (ref 32.0–36.0)
MCV: 88.7 fL (ref 80.0–100.0)
MPV: 11.7 fL (ref 7.5–12.5)
Monocytes Relative: 12.9 %
Neutro Abs: 3422 cells/uL (ref 1500–7800)
Neutrophils Relative %: 72.8 %
Platelets: 163 10*3/uL (ref 140–400)
RBC: 5.51 10*6/uL (ref 4.20–5.80)
RDW: 12.7 % (ref 11.0–15.0)
Total Lymphocyte: 12.9 %
WBC: 4.7 10*3/uL (ref 3.8–10.8)

## 2022-02-11 LAB — COMPLETE METABOLIC PANEL WITH GFR
AG Ratio: 1.8 (calc) (ref 1.0–2.5)
ALT: 29 U/L (ref 9–46)
AST: 25 U/L (ref 10–35)
Albumin: 4.6 g/dL (ref 3.6–5.1)
Alkaline phosphatase (APISO): 89 U/L (ref 35–144)
BUN: 15 mg/dL (ref 7–25)
CO2: 24 mmol/L (ref 20–32)
Calcium: 9.7 mg/dL (ref 8.6–10.3)
Chloride: 105 mmol/L (ref 98–110)
Creat: 1.07 mg/dL (ref 0.70–1.22)
Globulin: 2.5 g/dL (calc) (ref 1.9–3.7)
Glucose, Bld: 89 mg/dL (ref 65–99)
Potassium: 4.8 mmol/L (ref 3.5–5.3)
Sodium: 141 mmol/L (ref 135–146)
Total Bilirubin: 0.8 mg/dL (ref 0.2–1.2)
Total Protein: 7.1 g/dL (ref 6.1–8.1)
eGFR: 68 mL/min/{1.73_m2} (ref >=60)

## 2022-02-11 LAB — LIPID PANEL
Cholesterol: 180 mg/dL (ref <200)
HDL: 38 mg/dL — ABNORMAL LOW (ref >=40)
LDL Cholesterol (Calc): 111 mg/dL (calc) — ABNORMAL HIGH
Non-HDL Cholesterol (Calc): 142 mg/dL (calc) — ABNORMAL HIGH (ref <130)
Total CHOL/HDL Ratio: 4.7 (calc) (ref <5.0)
Triglycerides: 184 mg/dL — ABNORMAL HIGH (ref <150)

## 2022-02-11 LAB — MAGNESIUM: Magnesium: 2.2 mg/dL (ref 1.5–2.5)

## 2022-03-22 ENCOUNTER — Other Ambulatory Visit: Payer: Self-pay | Admitting: Internal Medicine

## 2022-03-22 DIAGNOSIS — K219 Gastro-esophageal reflux disease without esophagitis: Secondary | ICD-10-CM

## 2022-03-22 MED ORDER — FAMOTIDINE 20 MG PO TABS: ORAL_TABLET | ORAL | 3 refills | Status: DC

## 2022-04-13 ENCOUNTER — Encounter: Payer: Medicare HMO | Admitting: Internal Medicine

## 2022-06-07 ENCOUNTER — Encounter: Payer: Self-pay | Admitting: Internal Medicine

## 2022-06-07 NOTE — Progress Notes (Unsigned)
Annual  Screening/Preventative Visit  & Comprehensive Evaluation & Examination   Future Appointments  Date Time Provider Department  06/08/2022                  cpe  2:00 PM Unk Pinto, MD [facility]  09/09/2022                wellness                        11:30 AM Alycia Rossetti, NP [facility]  06/16/2023                   cpe  2:00 PM Unk Pinto, MD [facility]           This very nice 89 y.o. WWM presents for a Screening /Preventative Visit & comprehensive evaluation and management of multiple medical co-morbidities.  Patient has been followed for HTN, HLD, Prediabetes and Vitamin D Deficiency.  CT scan 2021 showed Aortic Atherosclerosis. Patient has Gleason 6 Prostate Ca  (2014) & is on finasteride followed by Dr Junious Silk.          HTN predates since 2003. Patient's BP has been controlled at home.  Today's BP was initially sl elevated & rechecked at goal  -132/86 .   Patient denies any cardiac symptoms as chest pain, palpitations, shortness of breath, dizziness or ankle swelling.       Patient's hyperlipidemia is controlled with diet and ezetimibe . Patient denies myalgias or other medication SE's. Last lipids were not  at goal  :  Lab Results  Component Value Date   CHOL 180 02/10/2022   HDL 38 (L) 02/10/2022   LDLCALC 111 (H) 02/10/2022   TRIG 184 (H) 02/10/2022   CHOLHDL 4.7 02/10/2022         Patient has hx/o prediabetes (A1c 5.8% /2008) and patient denies reactive hypoglycemic symptoms, visual blurring, diabetic polys or paresthesias. Last A1c was at goal :   Lab Results  Component Value Date   HGBA1C 5.5 11/04/2021         Finally, patient has history of Vitamin D Deficiency ("45 /2008) and last vitamin D was at goal :   Lab Results  Component Value Date   VD25OH 92 11/04/2021       Current Outpatient Medications  Medication Instructions   acetaminophen (TYLENOL) 500 mg, Oral, As needed   aspirin 81 MG EC tablet daily   VITAMIN D   2,000    Units 2 times daily   VITAMIN B12   Oral, Daily   famotidine  20 MG tablet Take 1 tablet 2 x / day    FLAX SEED OIL  1,200 mg, 3 times daily   Magnesium2  50 mg Daily   metoprolol tartrate (LOPRESSOR) 50 MG tablet Take  1 tablet  2 x / day (every 12 hours)    Omega-3 Fatty Acids (FISH OIL PO) 1,200 mg, Oral, 2 times daily   pantoprazole (PROTONIX) 20 mg, Oral, Daily     Allergies  Allergen Reactions   Pravastatin    Red Yeast Rice [Cholestin]    Sulfa Antibiotics    Vibramycin [Doxycycline Calcium]    Zetia [Ezetimibe]      Past Medical History:  Diagnosis Date   Essential hypertension 02/15/2007   Hyperlipidemia 07/13/2013   Prediabetes 07/13/2013   Prostate cancer ([place])    Pulmonary nodule (2008 resolved on f/u)  02/15/2007   Vitamin D deficiency 07/13/2013  Health Maintenance  Topic Date Due   COVID-19 Vaccine (3 - Moderna risk series) 03/26/2020   TETANUS/TDAP  05/16/2025   Pneumonia Vaccine 57+ Years old  Completed   INFLUENZA VACCINE  Completed   Zoster Vaccines- Shingrix  Completed   HPV VACCINES  Aged Out     Immunization History  Administered Date(s) Administered   DT (Pediatric) 05/17/2015   Influenza, High Dose 12/01/2017, 01/05/2019, 01/29/2020, 12/27/2020   Moderna SARS-COV2 Booster Vacc 02/27/2020   Moderna Sars-Covid-2 Vacc 05/01/2019, 05/30/2019   Pneumococcal -13 10/12/2013   Pneumococcal -23 05/17/2015   Zoster Recombinat (Shingrix) 08/13/2017, 10/14/2017    Last Colon -  03/2002 - Dr Deatra Ina - and aged out   Past Surgical History:  Procedure Laterality Date   PROSTATE BIOPSY  2020   PROSTATE BIOPSY  2014   TONSILECTOMY/ADENOIDECTOMY WITH MYRINGOTOMY     TREATMENT FISTULA ANAL       Family History  Problem Relation Age of Onset   Hyperlipidemia Brother    Hypertension Brother    Breast cancer Neg Hx    Colon cancer Neg Hx    Prostate cancer Neg Hx    Pancreatic cancer Neg Hx      Social History   Tobacco Use   Smoking  status: Never   Smokeless tobacco: Never  Vaping Use   Vaping Use: Never used  Substance Use Topics   Alcohol use: No   Drug use: Never      ROS Constitutional: Denies fever, chills, weight loss/gain, headaches, insomnia,  night sweats or change in appetite. Does c/o fatigue. Eyes: Denies redness, blurred vision, diplopia, discharge, itchy or watery eyes.  ENT: Denies discharge, congestion, post nasal drip, epistaxis, sore throat, earache, hearing loss, dental pain, Tinnitus, Vertigo, Sinus pain or snoring.  Cardio: Denies chest pain, palpitations, irregular heartbeat, syncope, dyspnea, diaphoresis, orthopnea, PND, claudication or edema Respiratory: denies cough, dyspnea, DOE, pleurisy, hoarseness, laryngitis or wheezing.  Gastrointestinal: Denies dysphagia, heartburn, reflux, water brash, pain, cramps, nausea, vomiting, bloating, diarrhea, constipation, hematemesis, melena, hematochezia, jaundice or hemorrhoids Genitourinary: Denies dysuria, frequency, urgency, nocturia, hesitancy, discharge, hematuria or flank pain Musculoskeletal: Denies arthralgia, myalgia, stiffness, Jt. Swelling, pain, limp or strain/sprain. Denies Falls. Skin: Denies puritis, rash, hives, warts, acne, eczema or change in skin lesion Neuro: No weakness, tremor, incoordination, spasms, paresthesia or pain Psychiatric: Denies confusion, memory loss or sensory loss. Denies Depression. Endocrine: Denies change in weight, skin, hair change, nocturia, and paresthesia, diabetic polys, visual blurring or hyper / hypo glycemic episodes.  Heme/Lymph: No excessive bleeding, bruising or enlarged lymph nodes.   Physical Exam  There were no vitals taken for this visit.  General Appearance: Well nourished and well groomed and in no apparent distress.  Eyes: PERRLA, EOMs, conjunctiva no swelling or erythema, normal fundi and vessels. Sinuses: No frontal/maxillary tenderness ENT/Mouth: EACs patent / TMs  nl. Nares clear  without erythema, swelling, mucoid exudates. Oral hygiene is good. No erythema, swelling, or exudate. Tongue normal, non-obstructing. Tonsils not swollen or erythematous. Hearing normal.  Neck: Supple, thyroid not palpable. No bruits, nodes or JVD. Respiratory: Respiratory effort normal.  BS equal and clear bilateral without rales, rhonci, wheezing or stridor. Cardio: Heart sounds are normal with regular rate and rhythm and no murmurs, rubs or gallops. Peripheral pulses are normal and equal bilaterally without edema. No aortic or femoral bruits. Chest: symmetric with normal excursions and percussion.  Abdomen: Soft, with Nl bowel sounds. Nontender, no guarding, rebound, hernias, masses, or organomegaly.  Lymphatics: Non  tender without lymphadenopathy.  Musculoskeletal: Full ROM all peripheral extremities, joint stability, 5/5 strength, and normal gait. Skin: Warm and dry without rashes, lesions, cyanosis, clubbing or  ecchymosis.  Neuro: Cranial nerves intact, reflexes equal bilaterally. Normal muscle tone, no cerebellar symptoms. Sensation intact.  Pysch: Alert and oriented X 3 with normal affect, insight and judgment appropriate.   Assessment and Plan  1. Annual Preventative/Screening Exam    2. Essential hypertension  - EKG 12-Lead - [place], RETROPERITNL ABD,  LTD - Urinalysis, Routine w reflex microscopic - Microalbumin / creatinine urine ratio - CBC with Differential/Platelet - COMPLETE METABOLIC PANEL WITH GFR - Magnesium - TSH  3. Hyperlipidemia, mixed  - EKG 12-Lead - [place], RETROPERITNL ABD,  LTD - Lipid panel - TSH  4. Abnormal glucose  - EKG 12-Lead - [place], RETROPERITNL ABD,  LTD - Hemoglobin A1c - Insulin, random  5. History of prostate cancer  - PSA  6. Vitamin D deficiency  - VITAMIN D 25 Hydroxy   7. Prostate cancer screening  - PSA  8. Aortic atherosclerosis ([place]) by CT scan 2021  - EKG 12-Lead - [place], RETROPERITNL ABD,  LTD - Lipid panel  9. Screening  for colorectal cancer  - POC Hemoccult Bld/Stl   10. Screening for heart disease  - EKG 12-Lead  11. FH: hypertension  - EKG 12-Lead - [place], RETROPERITNL ABD,  LTD  12. Screening for AAA (aortic abdominal aneurysm)  - [place], RETROPERITNL ABD,  LTD  13. Medication management  - Urinalysis, Routine w reflex microscopic - Microalbumin / creatinine urine ratio - Magnesium - Lipid panel - TSH - Hemoglobin A1c - Insulin, random - VITAMIN D 25 Hydroxy          Patient was counseled in prudent diet, weight control to achieve/maintain BMI less than 25, BP monitoring, regular exercise and medications as discussed.  Discussed med effects and SE's. Routine screening labs and tests as requested with regular follow-up as recommended. Over 40 minutes of exam, counseling, chart review and high complex critical decision making was performed   Kirtland Bouchard, MD

## 2022-06-07 NOTE — Patient Instructions (Signed)

## 2022-06-08 ENCOUNTER — Encounter: Payer: Self-pay | Admitting: Internal Medicine

## 2022-06-08 ENCOUNTER — Ambulatory Visit (INDEPENDENT_AMBULATORY_CARE_PROVIDER_SITE_OTHER): Payer: Medicare HMO | Admitting: Internal Medicine

## 2022-06-08 VITALS — BP 132/86 | HR 70 | Temp 97.9°F | Resp 16 | Ht 66.0 in | Wt 158.0 lb

## 2022-06-08 DIAGNOSIS — Z8249 Family history of ischemic heart disease and other diseases of the circulatory system: Secondary | ICD-10-CM

## 2022-06-08 DIAGNOSIS — E559 Vitamin D deficiency, unspecified: Secondary | ICD-10-CM | POA: Diagnosis not present

## 2022-06-08 DIAGNOSIS — Z8546 Personal history of malignant neoplasm of prostate: Secondary | ICD-10-CM

## 2022-06-08 DIAGNOSIS — E782 Mixed hyperlipidemia: Secondary | ICD-10-CM | POA: Diagnosis not present

## 2022-06-08 DIAGNOSIS — Z136 Encounter for screening for cardiovascular disorders: Secondary | ICD-10-CM | POA: Diagnosis not present

## 2022-06-08 DIAGNOSIS — R7309 Other abnormal glucose: Secondary | ICD-10-CM | POA: Diagnosis not present

## 2022-06-08 DIAGNOSIS — Z79899 Other long term (current) drug therapy: Secondary | ICD-10-CM | POA: Diagnosis not present

## 2022-06-08 DIAGNOSIS — Z0000 Encounter for general adult medical examination without abnormal findings: Secondary | ICD-10-CM | POA: Diagnosis not present

## 2022-06-08 DIAGNOSIS — Z125 Encounter for screening for malignant neoplasm of prostate: Secondary | ICD-10-CM | POA: Diagnosis not present

## 2022-06-08 DIAGNOSIS — I10 Essential (primary) hypertension: Secondary | ICD-10-CM

## 2022-06-08 DIAGNOSIS — Z0001 Encounter for general adult medical examination with abnormal findings: Secondary | ICD-10-CM

## 2022-06-08 DIAGNOSIS — I700 Atherosclerosis of aorta: Secondary | ICD-10-CM | POA: Diagnosis not present

## 2022-06-08 DIAGNOSIS — Z1211 Encounter for screening for malignant neoplasm of colon: Secondary | ICD-10-CM

## 2022-06-09 ENCOUNTER — Encounter: Payer: Self-pay | Admitting: Internal Medicine

## 2022-06-09 LAB — COMPLETE METABOLIC PANEL WITH GFR
AG Ratio: 1.8 (calc) (ref 1.0–2.5)
ALT: 26 U/L (ref 9–46)
AST: 21 U/L (ref 10–35)
Albumin: 4.4 g/dL (ref 3.6–5.1)
Alkaline phosphatase (APISO): 80 U/L (ref 35–144)
BUN: 18 mg/dL (ref 7–25)
CO2: 26 mmol/L (ref 20–32)
Calcium: 9.8 mg/dL (ref 8.6–10.3)
Chloride: 104 mmol/L (ref 98–110)
Creat: 1.21 mg/dL (ref 0.70–1.22)
Globulin: 2.4 g/dL (calc) (ref 1.9–3.7)
Glucose, Bld: 89 mg/dL (ref 65–99)
Potassium: 4.7 mmol/L (ref 3.5–5.3)
Sodium: 142 mmol/L (ref 135–146)
Total Bilirubin: 0.6 mg/dL (ref 0.2–1.2)
Total Protein: 6.8 g/dL (ref 6.1–8.1)
eGFR: 58 mL/min/{1.73_m2} — ABNORMAL LOW (ref >=60)

## 2022-06-09 LAB — MICROALBUMIN / CREATININE URINE RATIO
Creatinine, Urine: 148 mg/dL (ref 20–320)
Microalb Creat Ratio: 7 mg/g creat (ref <30)
Microalb, Ur: 1 mg/dL

## 2022-06-09 LAB — LIPID PANEL
Cholesterol: 172 mg/dL (ref <200)
HDL: 34 mg/dL — ABNORMAL LOW (ref >=40)
LDL Cholesterol (Calc): 103 mg/dL (calc) — ABNORMAL HIGH
Non-HDL Cholesterol (Calc): 138 mg/dL (calc) — ABNORMAL HIGH (ref <130)
Total CHOL/HDL Ratio: 5.1 (calc) — ABNORMAL HIGH (ref <5.0)
Triglycerides: 228 mg/dL — ABNORMAL HIGH (ref <150)

## 2022-06-09 LAB — CBC WITH DIFFERENTIAL/PLATELET
Absolute Monocytes: 604 cells/uL (ref 200–950)
Basophils Absolute: 42 cells/uL (ref 0–200)
Basophils Relative: 0.8 %
Eosinophils Absolute: 42 cells/uL (ref 15–500)
Eosinophils Relative: 0.8 %
HCT: 47.5 % (ref 38.5–50.0)
Hemoglobin: 16.2 g/dL (ref 13.2–17.1)
Lymphs Abs: 795 cells/uL — ABNORMAL LOW (ref 850–3900)
MCH: 30.7 pg (ref 27.0–33.0)
MCHC: 34.1 g/dL (ref 32.0–36.0)
MCV: 90 fL (ref 80.0–100.0)
MPV: 12 fL (ref 7.5–12.5)
Monocytes Relative: 11.4 %
Neutro Abs: 3816 cells/uL (ref 1500–7800)
Neutrophils Relative %: 72 %
Platelets: 156 10*3/uL (ref 140–400)
RBC: 5.28 10*6/uL (ref 4.20–5.80)
RDW: 12.3 % (ref 11.0–15.0)
Total Lymphocyte: 15 %
WBC: 5.3 10*3/uL (ref 3.8–10.8)

## 2022-06-09 LAB — URINALYSIS, ROUTINE W REFLEX MICROSCOPIC
Bilirubin Urine: NEGATIVE
Glucose, UA: NEGATIVE
Hgb urine dipstick: NEGATIVE
Ketones, ur: NEGATIVE
Leukocytes,Ua: NEGATIVE
Nitrite: NEGATIVE
Protein, ur: NEGATIVE
Specific Gravity, Urine: 1.019 (ref 1.001–1.035)
pH: 5.5 (ref 5.0–8.0)

## 2022-06-09 LAB — INSULIN, RANDOM: Insulin: 28.6 u[IU]/mL — ABNORMAL HIGH

## 2022-06-09 LAB — MAGNESIUM: Magnesium: 2.2 mg/dL (ref 1.5–2.5)

## 2022-06-09 LAB — HEMOGLOBIN A1C
Hgb A1c MFr Bld: 5.8 % of total Hgb — ABNORMAL HIGH (ref <5.7)
Mean Plasma Glucose: 120 mg/dL
eAG (mmol/L): 6.6 mmol/L

## 2022-06-09 LAB — VITAMIN D 25 HYDROXY (VIT D DEFICIENCY, FRACTURES): Vit D, 25-Hydroxy: 85 ng/mL (ref 30–100)

## 2022-06-09 LAB — TSH: TSH: 1.2 mIU/L (ref 0.40–4.50)

## 2022-06-09 LAB — PSA: PSA: 0.15 ng/mL (ref <=4.00)

## 2022-06-10 NOTE — Progress Notes (Signed)
<><><><><><><><><><><><><><><><><><><><><><><><><><><><><><><><><> <><><><><><><><><><><><><><><><><><><><><><><><><><><><><><><><><> - Test results slightly outside the reference range are not unusual. If there is anything important, I will review this with you,  otherwise it is considered normal test values.  If you have further questions,  please do not hesitate to contact me at the office or via My Chart.  <><><><><><><><><><><><><><><><><><><><><><><><><><><><><><><><><> <><><><><><><><><><><><><><><><><><><><><><><><><><><><><><><><><>  - PSA = 0.15 Very Very Low  - Great  !                      ( Will send copy labs to Dr Junious Silk)  <><><><><><><><><><><><><><><><><><><><><><><><><><><><><><><><><> <><><><><><><><><><><><><><><><><><><><><><><><><><><><><><><><><>   -  Total Chol = 172 - Excellent ,                                             but the Bad LDL Chol = 103 is elevated                                                                                                  ( Goal is less than 70 ! )   - Recommend a Stricter low cholesterol diet   - Cholesterol only comes from animal sources                                                                       - ie. meat, dairy, egg yolks  - Eat all the vegetables you want.  - Avoid Meat, Avoid Meat,  Avoid Meat                                                         - especially Red Meat - Beef AND Pork .  - Avoid cheese & dairy - milk & ice cream.     - Cheese is the most concentrated form of trans-fats which                                                                  is the worst thing to clog up our arteries.   - Veggie cheese is OK which can be found in the fresh produce section  at [employer] or [employer] or  [employer] <><><><><><><><><><><><><><><><><><><><><><><><><><><><><><><><><> <><><><><><><><><><><><><><><><><><><><><><><><><><><><><><><><><>  -  Also Triglycerides ( = 228 ) or fats in blood are too high                 (   Ideal or  Goal is less than 150  !  )    - Recommend avoid fried & greasy foods,  sweets / candy,   - Avoid white rice  (brown or wild rice or Quinoa is OK),   - Avoid white potatoes  (sweet potatoes are OK)   - Avoid anything made from white flour  - bagels, doughnuts, rolls, buns, biscuits, white and   wheat breads, pizza crust and traditional  pasta made of white flour & egg white  - (vegetarian pasta or spinach or wheat pasta is OK).    - Multi-grain bread is OK - like multi-grain flat bread or  sandwich thins.   - Avoid alcohol in excess.   - Exercise is also important. <><><><><><><><><><><><><><><><><><><><><><><><><><><><><><><><><> <><><><><><><><><><><><><><><><><><><><><><><><><><><><><><><><><>  -  A1c = 5.8%   elevated in the borderline and early or                                                               pre-diabetes range which has the same   300% increased risk for heart attack, stroke, cancer and                                     alzheimer- type vascular dementia as full blown diabetes.   But the good news is that diet, exercise with weight loss can                                                                       cure the early diabetes at this point.   -  It is very important that you work harder with diet by                           avoiding all foods that are white except chicken, fish & calliflower.  - Avoid white rice  (brown & wild rice is OK),   - Avoid white potatoes  (sweet potatoes in moderation is OK),   White bread or wheat bread or anything made out of   white flour like bagels, donuts, rolls, buns, biscuits, cakes,  - pastries, cookies, pizza crust, and pasta (made from  white flour & egg  whites)   - vegetarian pasta or spinach or wheat pasta is OK.  - Multigrain breads like Arnold's, [employer] or   multigrain sandwich thins or high fiber breads like   [place] bread or "[employer] Killer" breads that are  4 to 5 grams fiber per slice !  are best.    Diet, exercise and weight loss can reverse and cure  diabetes in the early stages.    - Diet, exercise and  weight loss is very important in the   control and prevention of complications of diabetes which  affects every system in your body, ie.   -Brain - dementia/stroke,  - eyes - glaucoma/blindness,  - heart - heart attack/heart failure,  - kidneys - dialysis,  - stomach - gastric paralysis,  - intestines - malabsorption,  - nerves - severe painful neuritis,  - circulation - gangrene & loss of a leg(s)  - and finally  . . . . . . . . . . . . . . . . . .    - cancer and Alzheimers. <><><><><><><><><><><><><><><><><><><><><><><><><><><><><><><><><> <><><><><><><><><><><><><><><><><><><><><><><><><><><><><><><><><>  -  Vitamin D = 85 - Excellent - Please keep dosing same !  <><><><><><><><><><><><><><><><><><><><><><><><><><><><><><><><><> <><><><><><><><><><><><><><><><><><><><><><><><><><><><><><><><><>  - All Else - CBC - Kidneys - Electrolytes - Liver - Magnesium & Thyroid    - all  Normal / OK <><><><><><><><><><><><><><><><><><><><><><><><><><><><><><><><><> <><><><><><><><><><><><><><><><><><><><><><><><><><><><><><><><><>

## 2022-06-15 DIAGNOSIS — Z8546 Personal history of malignant neoplasm of prostate: Secondary | ICD-10-CM | POA: Diagnosis not present

## 2022-06-22 DIAGNOSIS — H5213 Myopia, bilateral: Secondary | ICD-10-CM | POA: Diagnosis not present

## 2022-06-22 DIAGNOSIS — H52203 Unspecified astigmatism, bilateral: Secondary | ICD-10-CM | POA: Diagnosis not present

## 2022-06-22 DIAGNOSIS — Z0100 Encounter for examination of eyes and vision without abnormal findings: Secondary | ICD-10-CM | POA: Diagnosis not present

## 2022-06-22 DIAGNOSIS — Z8546 Personal history of malignant neoplasm of prostate: Secondary | ICD-10-CM | POA: Diagnosis not present

## 2022-06-22 DIAGNOSIS — H524 Presbyopia: Secondary | ICD-10-CM | POA: Diagnosis not present

## 2022-06-22 DIAGNOSIS — Z961 Presence of intraocular lens: Secondary | ICD-10-CM | POA: Diagnosis not present

## 2022-07-16 ENCOUNTER — Other Ambulatory Visit: Payer: Self-pay

## 2022-07-16 DIAGNOSIS — Z1212 Encounter for screening for malignant neoplasm of rectum: Secondary | ICD-10-CM | POA: Diagnosis not present

## 2022-07-16 DIAGNOSIS — Z1211 Encounter for screening for malignant neoplasm of colon: Secondary | ICD-10-CM | POA: Diagnosis not present

## 2022-07-16 LAB — POC HEMOCCULT BLD/STL (HOME/3-CARD/SCREEN)
Card #2 Fecal Occult Blod, POC: NEGATIVE
Card #3 Fecal Occult Blood, POC: NEGATIVE
Fecal Occult Blood, POC: NEGATIVE

## 2022-07-30 ENCOUNTER — Ambulatory Visit: Payer: Medicare HMO | Admitting: Nurse Practitioner

## 2022-08-06 ENCOUNTER — Other Ambulatory Visit: Payer: Self-pay | Admitting: Nurse Practitioner

## 2022-08-06 DIAGNOSIS — K219 Gastro-esophageal reflux disease without esophagitis: Secondary | ICD-10-CM

## 2022-09-08 NOTE — Progress Notes (Unsigned)
MEDICARE ANNUAL WELLNESS VISIT AND FOLLOW UP Assessment:   Diagnoses and all orders for this visit:  Annual Medicare Wellness Visit Due annually  Health maintenance reviewed  Aortic atherosclerosis (HCC) Per CT 03/2019 Control blood pressure, cholesterol, glucose, increase exercise.   Essential hypertension Well controlled at home; improved on recheck today  Monitor blood pressure at home; call if consistently over 130/80 Continue DASH diet.   Reminder to go to the ER if any CP, SOB, nausea, dizziness, severe HA, changes vision/speech, left arm numbness and tingling and jaw pain.  Pulmonary sarcoidosis (1988)  Followed by pulmonology PRN Denies recent sx  Prostate cancer [facility])  Managed by Dr. Mena Goes, s/p radiation, completed eligard x 18 months Plans to follow up 6 months  Vitamin D deficiency At goal at last check; continue to recommend supplementation for goal of 60-100 Defer vitamin D level to CPE  Other abnormal glucose (Prediabetes Discussed disease and risks Discussed diet/exercise, weight management  Check A1C q62m; CMP for glucose otherwise  Medication management CBC, CMP/GFR, Magnesium  Hyperlipidemia No longer treated due to med intolerances and patient preference  Mild elevations monitored Continue cholesterol diet/exercise -lipid panel   BMI 25 Continue to recommend diet heavy in fruits and veggies and low in animal meats, cheeses, and dairy products, appropriate calorie intake Discuss exercise recommendations routinely Continue to monitor weight at each visit  left foot drop/ moderate fall risk Continue ankle brace, cane/walker,  Has completed PT and had home safety check, continue home exercises  GERD Unsuccessful with taper. Continue Pantoprazole in am and Famotidine at night Discussed diet, avoiding triggers and other lifestyle changes - Magnesium      Over 30 minutes of exam, counseling, chart review, and critical decision making was  performed  Future Appointments  Date Time Provider Department Center  12/15/2022 10:30 AM Lucky Cowboy, MD [facility] None  03/22/2023 10:30 AM Raynelle Dick, NP [facility] None  06/22/2023 10:00 AM Lucky Cowboy, MD [facility] None  09/14/2023 11:30 AM Raynelle Dick, NP [facility] None      Plan:   During the course of the visit the patient was educated and counseled about appropriate screening and preventive services including:   Pneumococcal vaccine  Influenza vaccine Prevnar 13 Td vaccine Screening electrocardiogram Colorectal cancer screening Diabetes screening Glaucoma screening Nutrition counseling    Subjective:  Matthew Aguirre is a 89 y.o. male who presents for Medicare Annual Wellness Visit and 3 month follow up. He has Essential hypertension; Hyperlipidemia; Vitamin D deficiency; Medication management; Malignant neoplasm of prostate (HCC); Pulmonary sarcoidosis (1988) ; Other abnormal glucose; BMI 25.0-25.9,adult; Plantar fasciitis of left foot; Gastroesophageal reflux disease without esophagitis; Aortic atherosclerosis (HCC) by CT scan 2021; Left foot drop; and Radiation-induced lumbosacral plexopathy on their problem list.  Patient has hx/o Prostate Cancer (2014 ) followed by Dr Mena Goes, had PSA jump with recurrence, initiated on Eligard in Feb 2021, had prostate markers placed 4/6 and underwent external radiation, 40 treatments and complted eliguard. He developed L foot drop following radiation, had MRI and saw [facility], Dr. Jake Samples ordered PT with improvement, felt possible radiation-induced lumbosacral plexopathy. Continues with home exercises. Also ankle brace device for foot drop.   He uses cane/rolling walker. Had home safety check, no steps, has walk in shower and hand rails. 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  He reported mild depressive sx, low energy, was initiated on wellbutrin 150 mg, reports caused some jitteriness and  stopped taking, but reports sx seem  to have resolved off of eligard.   GERD on Famotidine 20 mg  QPM and Protonix 20 mg QAM daily for many years, hasn't attempted a taper, denies breakthrough sx.   BMI is Body mass index is 24.79 kg/m., he has been working on diet and exercise, does leg strengthening exercises daily 20-30 min. Walks 1-2 miles everyday Wt Readings from Last 3 Encounters:  09/09/22 153 lb 9.6 oz (69.7 kg)  06/08/22 158 lb (71.7 kg)  02/10/22 153 lb 12.8 oz (69.8 kg)   Ct abd/pelvis in 03/2019 showed aortic atherosclerosis His blood pressure has been controlled at home on Metoprolol 50 mg BID(110-130s/70s, has new BP cuff that was verified in office), today their BP is BP: 130/76,  BP Readings from Last 3 Encounters:  09/09/22 130/76  06/08/22 132/86  02/10/22 (!) 162/80  He does workout. He denies chest pain, shortness of breath, dizziness.   He is not on cholesterol medication. Hx of intolerance of RYRS, welcol, zetia, pravastatin (remote, can't recall what sx he had, intolerance). Currently taking fish oil. His cholesterol is not at goal. The cholesterol last visit was:   Lab Results  Component Value Date   CHOL 172 06/08/2022   HDL 34 (L) 06/08/2022   LDLCALC 103 (H) 06/08/2022   TRIG 228 (H) 06/08/2022   CHOLHDL 5.1 (H) 06/08/2022   He has been working on diet and exercise for glucose management, and denies foot ulcerations, increased appetite, nausea, paresthesia of the feet, polydipsia, polyuria, visual disturbances, vomiting and weight loss. Last A1C in the office was:  Lab Results  Component Value Date   HGBA1C 5.8 (H) 06/08/2022   He is trying to push water throughout the day. Last GFR Lab Results  Component Value Date   EGFR 58 (L) 06/08/2022    Patient is on Vitamin D supplement and at goal:    Lab Results  Component Value Date   VD25OH 85 06/08/2022        Medication Review:   Current Outpatient Medications (Cardiovascular):    metoprolol  tartrate (LOPRESSOR) 50 MG tablet, Take  1 tablet  2 x / day (every 12 hours) for BP                                                                              /                                   take                                 by                                    mouth   Current Outpatient Medications (Analgesics):    acetaminophen (TYLENOL) 500 MG tablet, Take 500 mg by mouth as needed (prn).    aspirin 81 MG EC tablet, Take by mouth.  Current Outpatient Medications (Hematological):    Cyanocobalamin (VITAMIN B12 PO), Take by  mouth daily.  Current Outpatient Medications (Other):    Cholecalciferol (VITAMIN D PO), Take 2,000 Int'l Units by mouth 2 (two) times daily.    famotidine (PEPCID) 20 MG tablet, Take 1 tablet 2 x / day to Prevent Heartburn & Indigestion . (Patient taking differently: Take 20 mg by mouth daily. Take 1 tablet daily to Prevent Heartburn & Indigestion .)   Flaxseed, Linseed, (FLAX SEED OIL PO), Take 1,200 mg by mouth 3 (three) times daily.    Magnesium 250 MG TABS, Take 250 mg by mouth daily.   Omega-3 Fatty Acids (FISH OIL PO), Take 1,200 mg by mouth 2 (two) times daily.    pantoprazole (PROTONIX) 20 MG tablet, Take  1 tablet  Daily  to Prevent Heartburn & Indigestion                                                       /                                                        TAKE                                        BY                                             MOUTH  Allergies: Allergies  Allergen Reactions   Pravastatin    Red Yeast Rice [Cholestin]    Sulfa Antibiotics    Vibramycin [Doxycycline Calcium]    Zetia [Ezetimibe]     Current Problems (verified) has Essential hypertension; Hyperlipidemia; Vitamin D deficiency; Medication management; Malignant neoplasm of prostate (HCC); Pulmonary sarcoidosis (1988) ; Other abnormal glucose; BMI 25.0-25.9,adult; Plantar fasciitis of left foot; Gastroesophageal reflux disease without esophagitis; Aortic  atherosclerosis (HCC) by CT scan 2021; Left foot drop; and Radiation-induced lumbosacral plexopathy on their problem list.  Screening Tests Immunization History  Administered Date(s) Administered   DT (Pediatric) 05/17/2015   Influenza, High Dose Seasonal PF 01/23/2014, 02/13/2015, 11/27/2015, 12/01/2016, 12/01/2017, 01/05/2019, 01/29/2020, 12/27/2020, 12/24/2021   Moderna SARS-COV2 Booster Vaccination 02/27/2020   Moderna Sars-Covid-2 Vaccination 05/01/2019, 05/30/2019   Pneumococcal Conjugate-13 10/12/2013   Pneumococcal Polysaccharide-23 05/17/2015   Zoster Recombinat (Shingrix) 08/13/2017, 10/14/2017   Health Maintenance  Topic Date Due   COVID-19 Vaccine (3 - Moderna risk series) 09/25/2022 (Originally 03/26/2020)   INFLUENZA VACCINE  10/15/2022   Medicare Annual Wellness (AWV)  09/09/2023   DTaP/Tdap/Td (2 - Tdap) 05/16/2025   Pneumonia Vaccine 59+ Years old  Completed   Zoster Vaccines- Shingrix  Completed   HPV VACCINES  Aged Out   Health Maintenance  Topic Date Due   COVID-19 Vaccine (3 - Moderna risk series) 09/25/2022 (Originally 03/26/2020)   INFLUENZA VACCINE  10/15/2022   Medicare Annual Wellness (AWV)  09/09/2023   DTaP/Tdap/Td (2 - Tdap) 05/16/2025   Pneumonia Vaccine 37+ Years old  Completed   Zoster Vaccines- Shingrix  Completed  HPV VACCINES  Aged Out     Names of Other Physician/Practitioners you currently use: 1. [facility] here for primary care 2. Dr. Nile Riggs, eye doctor, last visit 03/2022 3. Dr. Odis Luster, dentist, last visit 03/2022, goes q55m  Patient Care Team: Lucky Cowboy, MD as PCP - General (Internal Medicine) Jerilee Field, MD as Consulting Physician (Urology) Louis Meckel, MD (Inactive) as Consulting Physician (Gastroenterology) Jethro Bolus, MD as Consulting Physician (Ophthalmology)  Surgical: He  has a past surgical history that includes Treatment fistula anal; Tonsilectomy/adenoidectomy  with myringotomy; Prostate biopsy (2020); and Prostate biopsy (2014). Family His family history includes Hyperlipidemia in his brother; Hypertension in his brother. Social history  He reports that he has never smoked. He has never used smokeless tobacco. He reports that he does not drink alcohol and does not use drugs.  MEDICARE WELLNESS OBJECTIVES: Physical activity:  walks 1-2 miles everyday Cardiac risk factors: Cardiac Risk Factors include: advanced age (>39men, >50 women);dyslipidemia;hypertension;male gender Depression/mood screen:      09/09/2022   11:54 AM  Depression screen PHQ 2/9  Decreased Interest 0  Down, Depressed, Hopeless 0  PHQ - 2 Score 0    ADLs:     09/09/2022   11:52 AM 11/07/2021    6:58 PM  In your present state of health, do you have any difficulty performing the following activities:  Hearing? 0 0  Vision? 0 0  Difficulty concentrating or making decisions? 0 0  Walking or climbing stairs? 0 0  Dressing or bathing? 0 0  Doing errands, shopping? 0 0     Cognitive Testing  Alert? Yes  Normal Appearance?Yes  Oriented to person? Yes  Place? Yes   Time? Yes  Recall of three objects?  Yes  Can perform simple calculations? Yes  Displays appropriate judgment?Yes  Can read the correct time from a watch face?Yes  EOL planning: Does Patient Have a Medical Advance Directive?: Yes Type of Advance Directive: Healthcare Power of Attorney Does patient want to make changes to medical advance directive?: No - Patient declined Copy of Healthcare Power of Attorney in Chart?: No - copy requested   Objective:   Today's Vitals   09/09/22 1123  BP: 130/76  Pulse: 73  Temp: 98.1 F (36.7 C)  SpO2: 96%  Weight: 153 lb 9.6 oz (69.7 kg)  Height: 5\' 6"  (1.676 m)      Body mass index is 24.79 kg/m.  General appearance: alert, no distress, WD/WN, male HEENT: normocephalic, sclerae anicteric, TMs pearly, nares patent, no discharge or erythema, pharynx  normal Oral cavity: MMM, no lesions Neck: supple, no lymphadenopathy, no thyromegaly, no masses Heart: RRR, normal S1, S2, no murmurs Lungs: CTA bilaterally, no wheezes, rhonchi, or rales Abdomen: +bs, soft, non tender, non distended, no masses, no hepatomegaly, no splenomegaly Musculoskeletal: nontender, no swelling, no obvious deformity. He has brace on left foot/ankle to prevent foot drop Extremities: no edema, no cyanosis, no clubbing Pulses: 2+ symmetric, upper and lower extremities, normal cap refill Neurological: alert, oriented x 3, CN2-12 intact, strength normal upper extremities and lower extremities excepting left foot flexion, sensation diminished to left lower leg and ankle, DTRs 2+ upper extremities and patellar, no cerebellar signs, mildlly antalgic gait with cane.  Psychiatric: normal affect, behavior normal, pleasant   Medicare Attestation I have personally reviewed: The patient's medical and social history Their use of alcohol, tobacco or illicit drugs Their current medications and supplements The patient's functional ability including ADLs,fall risks, home  safety risks, cognitive, and hearing and visual impairment Diet and physical activities Evidence for depression or mood disorders  The patient's weight, height, BMI, and visual acuity have been recorded in the chart.  I have made referrals, counseling, and provided education to the patient based on review of the above and I have provided the patient with a written personalized care plan for preventive services.     Raynelle Dick, NP   09/09/2022

## 2022-09-09 ENCOUNTER — Encounter: Payer: Self-pay | Admitting: Nurse Practitioner

## 2022-09-09 ENCOUNTER — Ambulatory Visit (INDEPENDENT_AMBULATORY_CARE_PROVIDER_SITE_OTHER): Payer: Medicare HMO | Admitting: Nurse Practitioner

## 2022-09-09 VITALS — BP 130/76 | HR 73 | Temp 98.1°F | Ht 66.0 in | Wt 153.6 lb

## 2022-09-09 DIAGNOSIS — I10 Essential (primary) hypertension: Secondary | ICD-10-CM

## 2022-09-09 DIAGNOSIS — C61 Malignant neoplasm of prostate: Secondary | ICD-10-CM

## 2022-09-09 DIAGNOSIS — Z79899 Other long term (current) drug therapy: Secondary | ICD-10-CM

## 2022-09-09 DIAGNOSIS — Z0001 Encounter for general adult medical examination with abnormal findings: Secondary | ICD-10-CM | POA: Diagnosis not present

## 2022-09-09 DIAGNOSIS — R7309 Other abnormal glucose: Secondary | ICD-10-CM | POA: Diagnosis not present

## 2022-09-09 DIAGNOSIS — R6889 Other general symptoms and signs: Secondary | ICD-10-CM

## 2022-09-09 DIAGNOSIS — Z6823 Body mass index (BMI) 23.0-23.9, adult: Secondary | ICD-10-CM

## 2022-09-09 DIAGNOSIS — M21372 Foot drop, left foot: Secondary | ICD-10-CM | POA: Diagnosis not present

## 2022-09-09 DIAGNOSIS — E782 Mixed hyperlipidemia: Secondary | ICD-10-CM

## 2022-09-09 DIAGNOSIS — N1831 Chronic kidney disease, stage 3a: Secondary | ICD-10-CM | POA: Diagnosis not present

## 2022-09-09 DIAGNOSIS — D860 Sarcoidosis of lung: Secondary | ICD-10-CM | POA: Diagnosis not present

## 2022-09-09 DIAGNOSIS — I700 Atherosclerosis of aorta: Secondary | ICD-10-CM

## 2022-09-09 DIAGNOSIS — Z6825 Body mass index (BMI) 25.0-25.9, adult: Secondary | ICD-10-CM

## 2022-09-09 DIAGNOSIS — K219 Gastro-esophageal reflux disease without esophagitis: Secondary | ICD-10-CM | POA: Diagnosis not present

## 2022-09-09 DIAGNOSIS — E559 Vitamin D deficiency, unspecified: Secondary | ICD-10-CM

## 2022-09-09 DIAGNOSIS — Z0000 Encounter for general adult medical examination without abnormal findings: Secondary | ICD-10-CM

## 2022-09-09 NOTE — Patient Instructions (Signed)

## 2022-09-10 LAB — COMPLETE METABOLIC PANEL WITH GFR
AG Ratio: 2.4 (calc) (ref 1.0–2.5)
ALT: 30 U/L (ref 9–46)
AST: 21 U/L (ref 10–35)
Albumin: 4.7 g/dL (ref 3.6–5.1)
Alkaline phosphatase (APISO): 79 U/L (ref 35–144)
BUN: 20 mg/dL (ref 7–25)
CO2: 26 mmol/L (ref 20–32)
Calcium: 9.7 mg/dL (ref 8.6–10.3)
Chloride: 105 mmol/L (ref 98–110)
Creat: 1.21 mg/dL (ref 0.70–1.22)
Globulin: 2 g/dL (calc) (ref 1.9–3.7)
Glucose, Bld: 82 mg/dL (ref 65–99)
Potassium: 4.6 mmol/L (ref 3.5–5.3)
Sodium: 142 mmol/L (ref 135–146)
Total Bilirubin: 0.9 mg/dL (ref 0.2–1.2)
Total Protein: 6.7 g/dL (ref 6.1–8.1)
eGFR: 58 mL/min/{1.73_m2} — ABNORMAL LOW (ref >=60)

## 2022-09-10 LAB — CBC WITH DIFFERENTIAL/PLATELET
Absolute Monocytes: 655 cells/uL (ref 200–950)
Basophils Absolute: 41 cells/uL (ref 0–200)
Basophils Relative: 0.7 %
Eosinophils Absolute: 52 cells/uL (ref 15–500)
Eosinophils Relative: 0.9 %
HCT: 50.9 % — ABNORMAL HIGH (ref 38.5–50.0)
Hemoglobin: 17 g/dL (ref 13.2–17.1)
Lymphs Abs: 887 cells/uL (ref 850–3900)
MCH: 30.3 pg (ref 27.0–33.0)
MCHC: 33.4 g/dL (ref 32.0–36.0)
MCV: 90.7 fL (ref 80.0–100.0)
MPV: 11.8 fL (ref 7.5–12.5)
Monocytes Relative: 11.3 %
Neutro Abs: 4164 cells/uL (ref 1500–7800)
Neutrophils Relative %: 71.8 %
Platelets: 174 10*3/uL (ref 140–400)
RBC: 5.61 10*6/uL (ref 4.20–5.80)
RDW: 12.5 % (ref 11.0–15.0)
Total Lymphocyte: 15.3 %
WBC: 5.8 10*3/uL (ref 3.8–10.8)

## 2022-09-10 LAB — MAGNESIUM: Magnesium: 2.2 mg/dL (ref 1.5–2.5)

## 2022-09-10 LAB — LIPID PANEL
Cholesterol: 176 mg/dL (ref <200)
HDL: 41 mg/dL (ref >=40)
LDL Cholesterol (Calc): 109 mg/dL (calc) — ABNORMAL HIGH
Non-HDL Cholesterol (Calc): 135 mg/dL (calc) — ABNORMAL HIGH (ref <130)
Total CHOL/HDL Ratio: 4.3 (calc) (ref <5.0)
Triglycerides: 146 mg/dL (ref <150)

## 2022-12-15 ENCOUNTER — Ambulatory Visit (INDEPENDENT_AMBULATORY_CARE_PROVIDER_SITE_OTHER): Payer: Medicare HMO | Admitting: Internal Medicine

## 2022-12-15 ENCOUNTER — Encounter: Payer: Self-pay | Admitting: Internal Medicine

## 2022-12-15 VITALS — BP 138/78 | HR 72 | Temp 97.9°F | Resp 16 | Ht 66.0 in | Wt 154.0 lb

## 2022-12-15 DIAGNOSIS — I700 Atherosclerosis of aorta: Secondary | ICD-10-CM | POA: Diagnosis not present

## 2022-12-15 DIAGNOSIS — Z79899 Other long term (current) drug therapy: Secondary | ICD-10-CM

## 2022-12-15 DIAGNOSIS — R7309 Other abnormal glucose: Secondary | ICD-10-CM | POA: Diagnosis not present

## 2022-12-15 DIAGNOSIS — I10 Essential (primary) hypertension: Secondary | ICD-10-CM | POA: Diagnosis not present

## 2022-12-15 DIAGNOSIS — E782 Mixed hyperlipidemia: Secondary | ICD-10-CM

## 2022-12-15 DIAGNOSIS — Z23 Encounter for immunization: Secondary | ICD-10-CM

## 2022-12-15 DIAGNOSIS — E559 Vitamin D deficiency, unspecified: Secondary | ICD-10-CM | POA: Diagnosis not present

## 2022-12-15 NOTE — Progress Notes (Unsigned)
Future Appointments  Date Time Provider Department  12/15/2022                  6 mo ov 10:30 AM Lucky Cowboy, MD [facility]  03/22/2023                    ov & wellness 10:30 AM Raynelle Dick, NP [facility]  06/22/2023                    cpe 10:00 AM Lucky Cowboy, MD [facility]  09/14/2023                     3 mo ov 11:30 AM Raynelle Dick, NP [facility]             This very nice 89 y.o. WWM presents for 6 month f/u .  Patient has been followed for HTN, HLD, Prediabetes and Vitamin D Deficiency.  CT scan 2021 showed Aortic Atherosclerosis. Patient has Gleason 6 Prostate Ca  (2014) & is on finasteride followed by Dr Mena Goes.         HTN predates circa 2003. Patient's BP has been controlled at home.  Today's BP was initially sl elevated & rechecked at goal  - 138/78 . Patient denies any cardiac symptoms as chest pain, palpitations, shortness of breath, dizziness or ankle swelling.       Patient's hyperlipidemia is controlled with diet and ezetimibe . Patient denies myalgias or other medication SE's. Last lipids were at goal except elevated Trig's :  Lab Results  Component Value Date   CHOL 169 07/29/2021   HDL 37 (L) 07/29/2021   LDLCALC 99 07/29/2021   TRIG 210 (H) 07/29/2021   CHOLHDL 4.6 07/29/2021         Patient has hx/o prediabetes (A1c 5.8% /2008) and patient denies reactive hypoglycemic symptoms, visual blurring, diabetic polys or paresthesias.  A1c is at  goal :                                                                                    Lab Results  Component Value Date   HGBA1C 5.5 11/04/2021                       Finally, patient has history of Vitamin D Deficiency ("45 /2008) and last vitamin D was at goal :   Lab Results  Component Value Date   VD25OH 74 04/08/2021        Current Outpatient Medications:    acetaminophen (TYLENOL) 500 MG tablet, Take 500 mg by mouth as needed (prn). , Disp: , Rfl:    Alpha-Lipoic Acid 50 MG CAPS, Take  1 tablet by mouth daily., Disp: , Rfl:    aspirin 81 MG EC tablet, Take by mouth., Disp: , Rfl:    Cholecalciferol (VITAMIN D PO), Take 2,000 Int'l Units by mouth 2 (two) times daily. , Disp: , Rfl:    Cyanocobalamin (VITAMIN B12 PO), Take by mouth daily., Disp: , Rfl:    famotidine (PEPCID) 20 MG tablet, Take 1 tab prior to breakfast and bedtime as needed for  reflux., Disp: 180 tablet, Rfl: 1   Flaxseed, Linseed, (FLAX SEED OIL PO), Take 1,200 mg by mouth 3 (three) times daily. , Disp: , Rfl:    Magnesium 250 MG TABS, Take 250 mg by mouth daily., Disp: , Rfl:    metoprolol tartrate (LOPRESSOR) 50 MG tablet, Take 1 tablet (50 mg total) by mouth 2 (two) times daily., Disp: 180 tablet, Rfl: 3   Omega-3 Fatty Acids (FISH OIL PO), Take 1,200 mg by mouth 2 (two) times daily. , Disp: , Rfl:    pantoprazole (PROTONIX) 20 MG tablet, Take 1 tablet Daily  to Prevent Heartburn & Indigestion., Disp: 90 tablet, Rfl: 3   Allergies  Allergen Reactions   Pravastatin    Red Yeast Rice [Cholestin]    Sulfa Antibiotics    Vibramycin [Doxycycline Calcium]    Zetia [Ezetimibe]      Past Medical History:  Diagnosis Date   Essential hypertension 02/15/2007   Hyperlipidemia 07/13/2013   Prediabetes 07/13/2013   Prostate cancer (HCC)    Pulmonary nodule (2008 resolved on f/u)  02/15/2007   Vitamin D deficiency 07/13/2013     Health Maintenance  Topic Date Due   COVID-19 Vaccine (3 - Moderna risk series) 03/26/2020   TETANUS/TDAP  05/16/2025   Pneumonia Vaccine 66+ Years old  Completed   INFLUENZA VACCINE  Completed   Zoster Vaccines- Shingrix  Completed   HPV VACCINES  Aged Out     Immunization History  Administered Date(s) Administered   DT (Pediatric) 05/17/2015   Influenza, High Dose 12/01/2017, 01/05/2019, 01/29/2020, 12/27/2020   Moderna SARS-COV2 Booster Vacc 02/27/2020   Moderna Sars-Covid-2 Vacc 05/01/2019, 05/30/2019   Pneumococcal -13 10/12/2013   Pneumococcal -23 05/17/2015   Zoster  Recombinat (Shingrix) 08/13/2017, 10/14/2017    Last Colon -  03/2002 - Dr Arlyce Dice - and aged out   Past Surgical History:  Procedure Laterality Date   PROSTATE BIOPSY  2020   PROSTATE BIOPSY  2014   TONSILECTOMY/ADENOIDECTOMY WITH MYRINGOTOMY     TREATMENT FISTULA ANAL       Family History  Problem Relation Age of Onset   Hyperlipidemia Brother    Hypertension Brother    Breast cancer Neg Hx    Colon cancer Neg Hx    Prostate cancer Neg Hx    Pancreatic cancer Neg Hx      Social History   Tobacco Use   Smoking status: Never   Smokeless tobacco: Never  Vaping Use   Vaping Use: Never used  Substance Use Topics   Alcohol use: No   Drug use: Never      ROS Constitutional: Denies fever, chills, weight loss/gain, headaches, insomnia,  night sweats or change in appetite. Does c/o fatigue. Eyes: Denies redness, blurred vision, diplopia, discharge, itchy or watery eyes.  ENT: Denies discharge, congestion, post nasal drip, epistaxis, sore throat, earache, hearing loss, dental pain, Tinnitus, Vertigo, Sinus pain or snoring.  Cardio: Denies chest pain, palpitations, irregular heartbeat, syncope, dyspnea, diaphoresis, orthopnea, PND, claudication or edema Respiratory: denies cough, dyspnea, DOE, pleurisy, hoarseness, laryngitis or wheezing.  Gastrointestinal: Denies dysphagia, heartburn, reflux, water brash, pain, cramps, nausea, vomiting, bloating, diarrhea, constipation, hematemesis, melena, hematochezia, jaundice or hemorrhoids Genitourinary: Denies dysuria, frequency, urgency, nocturia, hesitancy, discharge, hematuria or flank pain Musculoskeletal: Denies arthralgia, myalgia, stiffness, Jt. Swelling, pain, limp or strain/sprain. Denies Falls. Skin: Denies puritis, rash, hives, warts, acne, eczema or change in skin lesion Neuro: No weakness, tremor, incoordination, spasms, paresthesia or pain Psychiatric: Denies confusion, memory loss  or sensory loss. Denies  Depression. Endocrine: Denies change in weight, skin, hair change, nocturia, and paresthesia, diabetic polys, visual blurring or hyper / hypo glycemic episodes.  Heme/Lymph: No excessive bleeding, bruising or enlarged lymph nodes.   Physical Exam  BP 138/78   Pulse 72   Temp 97.9 F (36.6 C)   Resp 16   Ht 5\' 6"  (1.676 m)   Wt 154 lb (69.9 kg)   SpO2 96%   BMI 24.86 kg/m   General Appearance: Well nourished and well groomed and in no apparent distress.  Eyes: PERRLA, EOMs, conjunctiva no swelling or erythema, normal fundi and vessels. Sinuses: No frontal/maxillary tenderness ENT/Mouth: EACs patent / TMs  nl. Nares clear without erythema, swelling, mucoid exudates. Oral hygiene is good. No erythema, swelling, or exudate. Tongue normal, non-obstructing. Tonsils not swollen or erythematous. Hearing normal.  Neck: Supple, thyroid not palpable. No bruits, nodes or JVD. Respiratory: Respiratory effort normal.  BS equal and clear bilateral without rales, rhonci, wheezing or stridor. Cardio: Heart sounds are normal with regular rate and rhythm and no murmurs, rubs or gallops. Peripheral pulses are normal and equal bilaterally without edema. No aortic or femoral bruits. Chest: symmetric with normal excursions and percussion.  Abdomen: Soft, with Nl bowel sounds. Nontender, no guarding, rebound, hernias, masses, or organomegaly.  Lymphatics: Non tender without lymphadenopathy.  Musculoskeletal: Full ROM all peripheral extremities, joint stability, 5/5 strength, and normal gait. Skin: Warm and dry without rashes, lesions, cyanosis, clubbing or  ecchymosis.  Neuro: Cranial nerves intact, reflexes equal bilaterally. Normal muscle tone, no cerebellar symptoms. Sensation intact.  Pysch: Alert and oriented X 3 with normal affect, insight and judgment appropriate.   Assessment and Plan  1. Essential hypertension  - CBC with Differential/Platelet - COMPLETE METABOLIC PANEL WITH GFR -  Magnesium - TSH  2. Hyperlipidemia, mixed  - Lipid panel - TSH  3. Abnormal glucose  - Hemoglobin A1c - Insulin, random  4. Vitamin D deficiency  - VITAMIN D 25 Hydroxy   5. Aortic atherosclerosis (HCC) by CT scan 2021  - Lipid panel  6. Medication management  - CBC with Differential/Platelet - COMPLETE METABOLIC PANEL WITH GFR - Magnesium - Lipid panel - TSH - Hemoglobin A1c - Insulin, random - VITAMIN D 25 Hydroxy         Patient was counseled in prudent diet, weight control to achieve/maintain BMI less than 25, BP monitoring, regular exercise and medications as discussed.  Discussed med effects and SE's. Routine screening labs and tests as requested with regular follow-up as recommended. Over 40 minutes of exam, counseling, chart review and high complex critical decision making was performed   Marinus Maw, MD.

## 2022-12-15 NOTE — Patient Instructions (Signed)

## 2022-12-16 ENCOUNTER — Encounter: Payer: Self-pay | Admitting: Internal Medicine

## 2022-12-16 LAB — LIPID PANEL
Cholesterol: 165 mg/dL (ref <200)
HDL: 35 mg/dL — ABNORMAL LOW (ref >=40)
LDL Cholesterol (Calc): 104 mg/dL — ABNORMAL HIGH
Non-HDL Cholesterol (Calc): 130 mg/dL — ABNORMAL HIGH (ref <130)
Total CHOL/HDL Ratio: 4.7 (calc) (ref <5.0)
Triglycerides: 146 mg/dL (ref <150)

## 2022-12-16 LAB — CBC WITH DIFFERENTIAL/PLATELET
Absolute Monocytes: 556 {cells}/uL (ref 200–950)
Basophils Absolute: 42 {cells}/uL (ref 0–200)
Basophils Relative: 0.8 %
Eosinophils Absolute: 31 {cells}/uL (ref 15–500)
Eosinophils Relative: 0.6 %
HCT: 52.7 % — ABNORMAL HIGH (ref 38.5–50.0)
Hemoglobin: 17.4 g/dL — ABNORMAL HIGH (ref 13.2–17.1)
Lymphs Abs: 806 {cells}/uL — ABNORMAL LOW (ref 850–3900)
MCH: 31 pg (ref 27.0–33.0)
MCHC: 33 g/dL (ref 32.0–36.0)
MCV: 93.9 fL (ref 80.0–100.0)
MPV: 11.6 fL (ref 7.5–12.5)
Monocytes Relative: 10.7 %
Neutro Abs: 3765 {cells}/uL (ref 1500–7800)
Neutrophils Relative %: 72.4 %
Platelets: 182 10*3/uL (ref 140–400)
RBC: 5.61 10*6/uL (ref 4.20–5.80)
RDW: 12.4 % (ref 11.0–15.0)
Total Lymphocyte: 15.5 %
WBC: 5.2 10*3/uL (ref 3.8–10.8)

## 2022-12-16 LAB — COMPLETE METABOLIC PANEL WITH GFR
AG Ratio: 2.1 (calc) (ref 1.0–2.5)
ALT: 29 U/L (ref 9–46)
AST: 23 U/L (ref 10–35)
Albumin: 4.6 g/dL (ref 3.6–5.1)
Alkaline phosphatase (APISO): 72 U/L (ref 35–144)
BUN/Creatinine Ratio: 12 (calc) (ref 6–22)
BUN: 15 mg/dL (ref 7–25)
CO2: 28 mmol/L (ref 20–32)
Calcium: 9.8 mg/dL (ref 8.6–10.3)
Chloride: 106 mmol/L (ref 98–110)
Creat: 1.28 mg/dL — ABNORMAL HIGH (ref 0.70–1.22)
Globulin: 2.2 g/dL (ref 1.9–3.7)
Glucose, Bld: 87 mg/dL (ref 65–99)
Potassium: 4.6 mmol/L (ref 3.5–5.3)
Sodium: 143 mmol/L (ref 135–146)
Total Bilirubin: 0.7 mg/dL (ref 0.2–1.2)
Total Protein: 6.8 g/dL (ref 6.1–8.1)
eGFR: 54 mL/min/{1.73_m2} — ABNORMAL LOW (ref >=60)

## 2022-12-16 LAB — TSH: TSH: 1.33 m[IU]/L (ref 0.40–4.50)

## 2022-12-16 LAB — INSULIN, RANDOM: Insulin: 37.8 u[IU]/mL — ABNORMAL HIGH

## 2022-12-16 LAB — HEMOGLOBIN A1C
Hgb A1c MFr Bld: 5.8 %{Hb} — ABNORMAL HIGH (ref <5.7)
Mean Plasma Glucose: 120 mg/dL
eAG (mmol/L): 6.6 mmol/L

## 2022-12-16 LAB — VITAMIN D 25 HYDROXY (VIT D DEFICIENCY, FRACTURES): Vit D, 25-Hydroxy: 87 ng/mL (ref 30–100)

## 2022-12-16 LAB — MAGNESIUM: Magnesium: 2.3 mg/dL (ref 1.5–2.5)

## 2022-12-16 NOTE — Progress Notes (Signed)
<>*<>*<>*<>*<>*<>*<>*<>*<>*<>*<>*<>*<>*<>*<>*<>*<>*<>*<>*<>*<>*<>*<>*<>*<> <>*<>*<>*<>*<>*<>*<>*<>*<>*<>*<>*<>*<>*<>*<>*<>*<>*<>*<>*<>*<>*<>*<>*<>*<>  -Test results slightly outside the reference range are not unusual. If there is anything important, I will review this with you,  otherwise it is considered normal test values.  If you have further questions,  please do not hesitate to contact me at the office or via My Chart.   <>*<>*<>*<>*<>*<>*<>*<>*<>*<>*<>*<>*<>*<>*<>*<>*<>*<>*<>*<>*<>*<>*<>*<>*<> <>*<>*<>*<>*<>*<>*<>*<>*<>*<>*<>*<>*<>*<>*<>*<>*<>*<>*<>*<>*<>*<>*<>*<>*<>  -  Kidney functions Stable in Stage 3a  ( which is very common for adults)  &   the best thing to protect the kidneys is   -  to drink adequate amounts of fluids to prevent permanent damage    - Recommend drink at least 6 bottles (16 ounces) of fluids /water /day = 96 Oz ~100 oz  - 100 oz = 3,000 cc or 3 liters / day  - >> That's 1 &1/2 bottles of a 2 liter soda bottle /day !   <>*<>*<>*<>*<>*<>*<>*<>*<>*<>*<>*<>*<>*<>*<>*<>*<>*<>*<>*<>*<>*<>*<>*<>*<> <>*<>*<>*<>*<>*<>*<>*<>*<>*<>*<>*<>*<>*<>*<>*<>*<>*<>*<>*<>*<>*<>*<>*<>*<>  -  Chol = 165  Excellent   - Very low risk for Heart Attack  / Stroke  <>*<>*<>*<>*<>*<>*<>*<>*<>*<>*<>*<>*<>*<>*<>*<>*<>*<>*<>*<>*<>*<>*<>*<>*<> <>*<>*<>*<>*<>*<>*<>*<>*<>*<>*<>*<>*<>*<>*<>*<>*<>*<>*<>*<>*<>*<>*<>*<>*<>  -  A1c = 5.8% - still borderline elevated in the                                       early or pre-diabetes range which has the same   300% increased risk for heart attack, stroke, cancer and                           alzheimer- type vascular dementia as full blown diabetes.   But the good news is that diet, exercise with                                   weight loss can cure the early diabetes at this point.   -  It is very important that you work harder with diet by  avoiding all foods that are white except chicken,   fish & calliflower.  - Avoid white rice   (brown & wild rice is OK),   - Avoid white potatoes  (sweet potatoes in moderation is OK),   White bread or wheat bread or anything made out of   white flour like bagels, donuts, rolls, buns, biscuits, cakes,  - pastries, cookies, pizza crust, and pasta (made from  white flour & egg whites)   - vegetarian pasta or spinach or wheat pasta is OK.  - Multigrain breads like Arnold's, Pepperidge [employer] or                     multigrain sandwich thins or high fiber breads like                                      [place] bread or "Dave's Killer" breads that are                                                               4 to 5 grams fiber per slice !  are best.    Diet, exercise and  weight loss can reverse and cure diabetes in the early stages.    - Diet, exercise and weight loss is very important in the                                       control and prevention of complications of diabetes which  affects every system in your body  <>*<>*<>*<>*<>*<>*<>*<>*<>*<>*<>*<>*<>*<>*<>*<>*<>*<>*<>*<>*<>*<>*<>*<>*<> <>*<>*<>*<>*<>*<>*<>*<>*<>*<>*<>*<>*<>*<>*<>*<>*<>*<>*<>*<>*<>*<>*<>*<>*<>  -  Vitamin D = 87  - Please keep dosage same  <>*<>*<>*<>*<>*<>*<>*<>*<>*<>*<>*<>*<>*<>*<>*<>*<>*<>*<>*<>*<>*<>*<>*<>*<> <>*<>*<>*<>*<>*<>*<>*<>*<>*<>*<>*<>*<>*<>*<>*<>*<>*<>*<>*<>*<>*<>*<>*<>*<>  -  All Else - CBC - Kidneys - Electrolytes - Liver - Magnesium & Thyroid    - all  Normal / OK  <>*<>*<>*<>*<>*<>*<>*<>*<>*<>*<>*<>*<>*<>*<>*<>*<>*<>*<>*<>*<>*<>*<>*<>*<> <>*<>*<>*<>*<>*<>*<>*<>*<>*<>*<>*<>*<>*<>*<>*<>*<>*<>*<>*<>*<>*<>*<>*<>*<>

## 2022-12-22 ENCOUNTER — Encounter: Payer: Self-pay | Admitting: Internal Medicine

## 2023-01-04 ENCOUNTER — Other Ambulatory Visit: Payer: Self-pay | Admitting: Internal Medicine

## 2023-01-04 DIAGNOSIS — R000 Tachycardia, unspecified: Secondary | ICD-10-CM

## 2023-01-04 MED ORDER — METOPROLOL TARTRATE 50 MG PO TABS
ORAL_TABLET | ORAL | 3 refills | Status: DC
Start: 2023-01-04 — End: 2023-07-14

## 2023-03-19 NOTE — Progress Notes (Signed)
 MEDICARE ANNUAL WELLNESS VISIT AND FOLLOW UP Assessment:   Diagnoses and all orders for this visit:  Annual Medicare Wellness Visit Due annually  Health maintenance reviewed  Aortic atherosclerosis (HCC) Per CT 03/2019 Control blood pressure, cholesterol, glucose, increase exercise.   Essential hypertension Continue to monitor BP at home, if remains greater than 130/80 consistently notify the office Continue DASH diet.   Reminder to go to the ER if any CP, SOB, nausea, dizziness, severe HA, changes vision/speech, left arm numbness and tingling and jaw pain.  Pulmonary sarcoidosis (1988)  Followed by pulmonology PRN Denies recent sx  CKD Stage 3 a(HCC) Increase fluids, avoid NSAIDS, monitor sugars, will monitor   Prostate cancer [facility])  Managed by Dr. Nieves, s/p radiation, completed eligard  x 18 months Plans to follow up 6 months  Vitamin D  deficiency At goal at last check; continue to recommend supplementation for goal of 60-100 Defer vitamin D  level to CPE  Other abnormal glucose (Prediabetes Discussed disease and risks Discussed diet/exercise, weight management  Check A1C q13m; CMP for glucose otherwise  Medication management CBC, CMP/GFR, Magnesium  Hyperlipidemia No longer treated due to med intolerances and patient preference  Mild elevations monitored Continue cholesterol diet/exercise -lipid panel   BMI 25 Continue to recommend diet heavy in fruits and veggies and low in animal meats, cheeses, and dairy products, appropriate calorie intake Discuss exercise recommendations routinely Continue to monitor weight at each visit  left foot drop/ moderate fall risk Continue ankle brace, cane/walker,  Has completed PT and had home safety check, continue home exercises  GERD Unsuccessful with taper. Continue Pantoprazole  in am and Famotidine  at night Discussed diet, avoiding triggers and other lifestyle changes - Magnesium      Over 30 minutes of exam,  counseling, chart review, and critical decision making was performed  Future Appointments  Date Time Provider Department Center  06/22/2023 10:00 AM Tonita Fallow, MD [facility] None  09/29/2023 10:30 AM Aryeh Butterfield E, NP [facility] None  03/21/2024 11:00 AM Yanelie Abraha E, NP [facility] None      Plan:   During the course of the visit the patient was educated and counseled about appropriate screening and preventive services including:   Pneumococcal vaccine  Influenza vaccine Prevnar 13 Td vaccine Screening electrocardiogram Colorectal cancer screening Diabetes screening Glaucoma screening Nutrition counseling    Subjective:  Matthew Aguirre is a 89 y.o. male who presents for Medicare Annual Wellness Visit and 3 month follow up. He has Essential hypertension; Hyperlipidemia; Vitamin D  deficiency; Medication management; Malignant neoplasm of prostate (HCC); Pulmonary sarcoidosis (1988) ; Other abnormal glucose; BMI 25.0-25.9,adult; Plantar fasciitis of left foot; Gastroesophageal reflux disease without esophagitis; Aortic atherosclerosis (HCC) by CT scan 2021; Left foot drop; and Radiation-induced lumbosacral plexopathy on their problem list.  Patient has hx/o Prostate Cancer (2014 ) followed by Dr Nieves, had PSA jump with recurrence, initiated on Eligard  in Feb 2021, had prostate markers placed 4/6 and underwent external radiation, 40 treatments and complted eliguard. He developed L foot drop following radiation, had MRI and saw [facility], Dr. Carollee ordered PT with improvement, felt possible radiation-induced lumbosacral plexopathy. Continues with home exercises. Also ankle brace device for foot drop.   He uses cane. Had home safety check, no steps, has walk in shower and hand rails. 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  He reported mild depressive sx, low energy, was initiated on wellbutrin  150 mg, reports caused some jitteriness and stopped  taking, but reports sx seem  to have resolved off of eligard .   GERD on Famotidine  20 mg  QPM and Protonix  20 mg QAM daily for many years, hasn't attempted a taper, denies breakthrough sx.   BMI is Body mass index is 25.31 kg/m., he has been working on diet and exercise, does leg strengthening exercises daily 20-30 min. Walks 1-2 miles everyday Wt Readings from Last 3 Encounters:  03/22/23 156 lb 12.8 oz (71.1 kg)  12/15/22 154 lb (69.9 kg)  09/09/22 153 lb 9.6 oz (69.7 kg)   Ct abd/pelvis in 03/2019 showed aortic atherosclerosis His blood pressure has been controlled at home on Metoprolol  50 mg BID(120-130s/70s, has new BP cuff that was verified in office), today their BP is BP: (!) 146/80, He had an episode of anxiety Saturday night and BP was elevated to 150/80's for approximately 24-48 hours.  Denies chest pain, headaches, dizziness or shortness of breath.  Has Resolved. BP Readings from Last 3 Encounters:  03/22/23 (!) 146/80  12/15/22 138/78  09/09/22 130/76  He does workout. He denies chest pain, shortness of breath, dizziness.   Pulse Readings from Last 3 Encounters:  03/22/23 74  12/15/22 72  09/09/22 73     He is not on cholesterol medication. Hx of intolerance of RYRS, welcol, zetia, pravastatin (remote, can't recall what sx he had, intolerance). Currently taking fish oil and flaxseed oil. His cholesterol is not at goal. The cholesterol last visit was:   Lab Results  Component Value Date   CHOL 165 12/15/2022   HDL 35 (L) 12/15/2022   LDLCALC 104 (H) 12/15/2022   TRIG 146 12/15/2022   CHOLHDL 4.7 12/15/2022   He has been working on diet and exercise for glucose management, and denies foot ulcerations, increased appetite, nausea, paresthesia of the feet, polydipsia, polyuria, visual disturbances, vomiting and weight loss. Last A1C in the office was:  Lab Results  Component Value Date   HGBA1C 5.8 (H) 12/15/2022   He is trying to push water throughout the day. Last  GFR Lab Results  Component Value Date   EGFR 54 (L) 12/15/2022    Patient is on Vitamin D  supplement and at goal:    Lab Results  Component Value Date   VD25OH 87 12/15/2022        Medication Review:   Current Outpatient Medications (Cardiovascular):    metoprolol  tartrate (LOPRESSOR ) 50 MG tablet, Take  1 tablet  2 x / day (every 12 hours) for BP                                                                              /                                   take                                 by  mouth   Current Outpatient Medications (Analgesics):    acetaminophen (TYLENOL) 500 MG tablet, Take 500 mg by mouth as needed (prn).    aspirin 81 MG EC tablet, Take by mouth.  Current Outpatient Medications (Hematological):    Cyanocobalamin  (VITAMIN B12 PO), Take by mouth daily.  Current Outpatient Medications (Other):    Cholecalciferol (VITAMIN D  PO), Take 2,000 Int'l Units by mouth 2 (two) times daily.    famotidine  (PEPCID ) 20 MG tablet, Take 1 tablet 2 x / day to Prevent Heartburn & Indigestion . (Patient taking differently: Take 20 mg by mouth daily. Take 1 tablet daily to Prevent Heartburn & Indigestion .)   Flaxseed, Linseed, (FLAX SEED OIL PO), Take 1,200 mg by mouth 3 (three) times daily.    Magnesium 250 MG TABS, Take 250 mg by mouth daily.   Omega-3 Fatty Acids (FISH OIL PO), Take 1,200 mg by mouth 2 (two) times daily.    pantoprazole  (PROTONIX ) 20 MG tablet, Take  1 tablet  Daily  to Prevent Heartburn & Indigestion                                                       /                                                        TAKE                                        BY                                             MOUTH  Allergies: Allergies  Allergen Reactions   Pravastatin    Red Yeast Rice [Cholestin]    Sulfa Antibiotics    Vibramycin [Doxycycline Calcium]    Zetia [Ezetimibe]     Current Problems (verified) has Essential  hypertension; Hyperlipidemia; Vitamin D  deficiency; Medication management; Malignant neoplasm of prostate (HCC); Pulmonary sarcoidosis (1988) ; Other abnormal glucose; BMI 25.0-25.9,adult; Plantar fasciitis of left foot; Gastroesophageal reflux disease without esophagitis; Aortic atherosclerosis (HCC) by CT scan 2021; Left foot drop; and Radiation-induced lumbosacral plexopathy on their problem list.  Screening Tests Immunization History  Administered Date(s) Administered   DT (Pediatric) 05/17/2015   Influenza, High Dose Seasonal PF 01/23/2014, 02/13/2015, 11/27/2015, 12/01/2016, 12/01/2017, 01/05/2019, 01/29/2020, 12/27/2020, 12/24/2021, 12/15/2022   Moderna SARS-COV2 Booster Vaccination 02/27/2020   Moderna Sars-Covid-2 Vaccination 05/01/2019, 05/30/2019   Pneumococcal Conjugate-13 10/12/2013   Pneumococcal Polysaccharide-23 05/17/2015   Zoster Recombinant(Shingrix) 08/13/2017, 10/14/2017   Health Maintenance  Topic Date Due   COVID-19 Vaccine (3 - Moderna risk series) 04/07/2023 (Originally 03/26/2020)   Medicare Annual Wellness (AWV)  03/21/2024   DTaP/Tdap/Td (2 - Tdap) 05/16/2025   Pneumonia Vaccine 80+ Years old  Completed   INFLUENZA VACCINE  Completed   Zoster Vaccines- Shingrix  Completed   HPV VACCINES  Aged Out      Names of Other Physician/Practitioners  you currently use: 1. [facility] here for primary care 2. Dr. Roz, eye doctor, last visit 06/2022- now retired 3. Dr. Benedict, dentist, last visit 09/2022  Patient Care Team: Tonita Fallow, MD as PCP - General (Internal Medicine) Nieves Cough, MD as Consulting Physician (Urology) Debrah Lamar BIRCH, MD (Inactive) as Consulting Physician (Gastroenterology) Roz Anes, MD as Consulting Physician (Ophthalmology)  Surgical: He  has a past surgical history that includes Treatment fistula anal; Tonsilectomy/adenoidectomy with myringotomy; Prostate biopsy (2020); and  Prostate biopsy (2014). Family His family history includes Hyperlipidemia in his brother; Hypertension in his brother. Social history  He reports that he has never smoked. He has never used smokeless tobacco. He reports that he does not drink alcohol and does not use drugs.  MEDICARE WELLNESS OBJECTIVES: Physical activity:  walks 1 miles everyday if weather is ok.  Cardiac risk factors: Cardiac Risk Factors include: advanced age (>23men, >28 women);dyslipidemia;hypertension;male gender Depression/mood screen:      03/22/2023   11:15 AM  Depression screen PHQ 2/9  Decreased Interest 0  Down, Depressed, Hopeless 0  PHQ - 2 Score 0    ADLs:     03/22/2023   11:12 AM 12/16/2022   12:04 AM  In your present state of health, do you have any difficulty performing the following activities:  Hearing? 0 0  Vision? 0 0  Difficulty concentrating or making decisions? 0 0  Walking or climbing stairs? 0 0  Dressing or bathing? 0 0  Doing errands, shopping? 0 0  Preparing Food and eating ? N   Using the Toilet? N   In the past six months, have you accidently leaked urine? N   Do you have problems with loss of bowel control? N   Managing your Medications? N   Managing your Finances? N   Housekeeping or managing your Housekeeping? N      Cognitive Testing  Alert? Yes  Normal Appearance?Yes  Oriented to person? Yes  Place? Yes   Time? Yes  Recall of three objects?  Yes  Can perform simple calculations? Yes  Displays appropriate judgment?Yes  Can read the correct time from a watch face?Yes  EOL planning: Does Patient Have a Medical Advance Directive?: Yes Type of Advance Directive: Healthcare Power of Attorney, Living will Does patient want to make changes to medical advance directive?: No - Patient declined Copy of Healthcare Power of Attorney in Chart?: No - copy requested   Objective:   Today's Vitals   03/22/23 1026  BP: (!) 146/80  Pulse: 74  Temp: 97.9 F (36.6 C)  SpO2: 96%   Weight: 156 lb 12.8 oz (71.1 kg)  Height: 5' 6 (1.676 m)       Body mass index is 25.31 kg/m.  General appearance: alert, no distress, WD/WN, male HEENT: normocephalic, sclerae anicteric, TMs pearly, nares patent, no discharge or erythema, pharynx normal Oral cavity: MMM, no lesions Neck: supple, no lymphadenopathy, no thyromegaly, no masses Heart: RRR, normal S1, S2, no murmurs Lungs: CTA bilaterally, no wheezes, rhonchi, or rales Abdomen: +bs, soft, non tender, non distended, no masses, no hepatomegaly, no splenomegaly Musculoskeletal: nontender, no swelling, no obvious deformity. He has brace on left foot/ankle to prevent foot drop Extremities: no edema, no cyanosis, no clubbing Pulses: 2+ symmetric, upper and lower extremities, normal cap refill Neurological: alert, oriented x 3, CN2-12 intact, strength normal upper extremities and lower extremities excepting left foot flexion, sensation diminished to left lower leg and ankle, DTRs 2+  upper extremities and patellar, no cerebellar signs, mildlly antalgic gait with cane.  Psychiatric: normal affect, behavior normal, pleasant   Medicare Attestation I have personally reviewed: The patient's medical and social history Their use of alcohol, tobacco or illicit drugs Their current medications and supplements The patient's functional ability including ADLs,fall risks, home safety risks, cognitive, and hearing and visual impairment Diet and physical activities Evidence for depression or mood disorders  The patient's weight, height, BMI, and visual acuity have been recorded in the chart.  I have made referrals, counseling, and provided education to the patient based on review of the above and I have provided the patient with a written personalized care plan for preventive services.     Malyna Budney E Jeffery Bachmeier, NP   03/22/2023

## 2023-03-22 ENCOUNTER — Encounter: Payer: Self-pay | Admitting: Nurse Practitioner

## 2023-03-22 ENCOUNTER — Ambulatory Visit (INDEPENDENT_AMBULATORY_CARE_PROVIDER_SITE_OTHER): Payer: Medicare HMO | Admitting: Nurse Practitioner

## 2023-03-22 VITALS — BP 146/80 | HR 74 | Temp 97.9°F | Ht 66.0 in | Wt 156.8 lb

## 2023-03-22 DIAGNOSIS — Z79899 Other long term (current) drug therapy: Secondary | ICD-10-CM | POA: Diagnosis not present

## 2023-03-22 DIAGNOSIS — Z0001 Encounter for general adult medical examination with abnormal findings: Secondary | ICD-10-CM

## 2023-03-22 DIAGNOSIS — Z0000 Encounter for general adult medical examination without abnormal findings: Secondary | ICD-10-CM

## 2023-03-22 DIAGNOSIS — I700 Atherosclerosis of aorta: Secondary | ICD-10-CM

## 2023-03-22 DIAGNOSIS — D860 Sarcoidosis of lung: Secondary | ICD-10-CM | POA: Diagnosis not present

## 2023-03-22 DIAGNOSIS — Z6825 Body mass index (BMI) 25.0-25.9, adult: Secondary | ICD-10-CM

## 2023-03-22 DIAGNOSIS — E782 Mixed hyperlipidemia: Secondary | ICD-10-CM | POA: Diagnosis not present

## 2023-03-22 DIAGNOSIS — K219 Gastro-esophageal reflux disease without esophagitis: Secondary | ICD-10-CM | POA: Diagnosis not present

## 2023-03-22 DIAGNOSIS — I10 Essential (primary) hypertension: Secondary | ICD-10-CM

## 2023-03-22 DIAGNOSIS — R7309 Other abnormal glucose: Secondary | ICD-10-CM | POA: Diagnosis not present

## 2023-03-22 DIAGNOSIS — R6889 Other general symptoms and signs: Secondary | ICD-10-CM | POA: Diagnosis not present

## 2023-03-22 DIAGNOSIS — M21372 Foot drop, left foot: Secondary | ICD-10-CM

## 2023-03-22 DIAGNOSIS — N1831 Chronic kidney disease, stage 3a: Secondary | ICD-10-CM | POA: Diagnosis not present

## 2023-03-22 DIAGNOSIS — C61 Malignant neoplasm of prostate: Secondary | ICD-10-CM

## 2023-03-22 DIAGNOSIS — E559 Vitamin D deficiency, unspecified: Secondary | ICD-10-CM | POA: Diagnosis not present

## 2023-03-22 NOTE — Patient Instructions (Signed)
 Hypertension, Adult Hypertension is another name for high blood pressure. High blood pressure forces your heart to work harder to pump blood. This can cause problems over time. There are two numbers in a blood pressure reading. There is a top number (systolic) over a bottom number (diastolic). It is best to have a blood pressure that is below 120/80. What are the causes? The cause of this condition is not known. Some other conditions can lead to high blood pressure. What increases the risk? Some lifestyle factors can make you more likely to develop high blood pressure: Smoking. Not getting enough exercise or physical activity. Being overweight. Having too much fat, sugar, calories, or salt (sodium) in your diet. Drinking too much alcohol. Other risk factors include: Having any of these conditions: Heart disease. Diabetes. High cholesterol. Kidney disease. Obstructive sleep apnea. Having a family history of high blood pressure and high cholesterol. Age. The risk increases with age. Stress. What are the signs or symptoms? High blood pressure may not cause symptoms. Very high blood pressure (hypertensive crisis) may cause: Headache. Fast or uneven heartbeats (palpitations). Shortness of breath. Nosebleed. Vomiting or feeling like you may vomit (nauseous). Changes in how you see. Very bad chest pain. Feeling dizzy. Seizures. How is this treated? This condition is treated by making healthy lifestyle changes, such as: Eating healthy foods. Exercising more. Drinking less alcohol. Your doctor may prescribe medicine if lifestyle changes do not help enough and if: Your top number is above 130. Your bottom number is above 80. Your personal target blood pressure may vary. Follow these instructions at home: Eating and drinking  If told, follow the DASH eating plan. To follow this plan: Fill one half of your plate at each meal with fruits and vegetables. Fill one fourth of your plate  at each meal with whole grains. Whole grains include whole-wheat pasta, brown rice, and whole-grain bread. Eat or drink low-fat dairy products, such as skim milk or low-fat yogurt. Fill one fourth of your plate at each meal with low-fat (lean) proteins. Low-fat proteins include fish, chicken without skin, eggs, beans, and tofu. Avoid fatty meat, cured and processed meat, or chicken with skin. Avoid pre-made or processed food. Limit the amount of salt in your diet to less than 1,500 mg each day. Do not drink alcohol if: Your doctor tells you not to drink. You are pregnant, may be pregnant, or are planning to become pregnant. If you drink alcohol: Limit how much you have to: 0-1 drink a day for women. 0-2 drinks a day for men. Know how much alcohol is in your drink. In the [place] one drink equals one 12 oz bottle of beer (355 mL), one 5 oz glass of wine (148 mL), or one 1 oz glass of hard liquor (44 mL). Lifestyle  Work with your doctor to stay at a healthy weight or to lose weight. Ask your doctor what the best weight is for you. Get at least 30 minutes of exercise that causes your heart to beat faster (aerobic exercise) most days of the week. This may include walking, swimming, or biking. Get at least 30 minutes of exercise that strengthens your muscles (resistance exercise) at least 3 days a week. This may include lifting weights or doing Pilates. Do not smoke or use any products that contain nicotine or tobacco. If you need help quitting, ask your doctor. Check your blood pressure at home as told by your doctor. Keep all follow-up visits. Medicines Take over-the-counter and prescription medicines  only as told by your doctor. Follow directions carefully. Do not skip doses of blood pressure medicine. The medicine does not work as well if you skip doses. Skipping doses also puts you at risk for problems. Ask your doctor about side effects or reactions to medicines that you should watch  for. Contact a doctor if: You think you are having a reaction to the medicine you are taking. You have headaches that keep coming back. You feel dizzy. You have swelling in your ankles. You have trouble with your vision. Get help right away if: You get a very bad headache. You start to feel mixed up (confused). You feel weak or numb. You feel faint. You have very bad pain in your: Chest. Belly (abdomen). You vomit more than once. You have trouble breathing. These symptoms may be an emergency. Get help right away. Call 911. Do not wait to see if the symptoms will go away. Do not drive yourself to the hospital. Summary Hypertension is another name for high blood pressure. High blood pressure forces your heart to work harder to pump blood. For most people, a normal blood pressure is less than 120/80. Making healthy choices can help lower blood pressure. If your blood pressure does not get lower with healthy choices, you may need to take medicine. This information is not intended to replace advice given to you by your health care provider. Make sure you discuss any questions you have with your health care provider. Document Revised: 12/19/2020 Document Reviewed: 12/19/2020 Elsevier Patient Education  2024 [employer].

## 2023-03-23 LAB — CBC WITH DIFFERENTIAL/PLATELET
Absolute Lymphocytes: 808 {cells}/uL — ABNORMAL LOW (ref 850–3900)
Absolute Monocytes: 625 {cells}/uL (ref 200–950)
Basophils Absolute: 41 {cells}/uL (ref 0–200)
Basophils Relative: 0.7 %
Eosinophils Absolute: 30 {cells}/uL (ref 15–500)
Eosinophils Relative: 0.5 %
HCT: 53.1 % — ABNORMAL HIGH (ref 38.5–50.0)
Hemoglobin: 17.2 g/dL — ABNORMAL HIGH (ref 13.2–17.1)
MCH: 29.5 pg (ref 27.0–33.0)
MCHC: 32.4 g/dL (ref 32.0–36.0)
MCV: 90.9 fL (ref 80.0–100.0)
MPV: 11.7 fL (ref 7.5–12.5)
Monocytes Relative: 10.6 %
Neutro Abs: 4396 {cells}/uL (ref 1500–7800)
Neutrophils Relative %: 74.5 %
Platelets: 166 10*3/uL (ref 140–400)
RBC: 5.84 10*6/uL — ABNORMAL HIGH (ref 4.20–5.80)
RDW: 12.4 % (ref 11.0–15.0)
Total Lymphocyte: 13.7 %
WBC: 5.9 10*3/uL (ref 3.8–10.8)

## 2023-03-23 LAB — COMPLETE METABOLIC PANEL WITH GFR
AG Ratio: 2.1 (calc) (ref 1.0–2.5)
ALT: 34 U/L (ref 9–46)
AST: 27 U/L (ref 10–35)
Albumin: 4.7 g/dL (ref 3.6–5.1)
Alkaline phosphatase (APISO): 74 U/L (ref 35–144)
BUN: 16 mg/dL (ref 7–25)
CO2: 24 mmol/L (ref 20–32)
Calcium: 9.5 mg/dL (ref 8.6–10.3)
Chloride: 105 mmol/L (ref 98–110)
Creat: 1.2 mg/dL (ref 0.70–1.22)
Globulin: 2.2 g/dL (ref 1.9–3.7)
Glucose, Bld: 77 mg/dL (ref 65–99)
Potassium: 4.6 mmol/L (ref 3.5–5.3)
Sodium: 140 mmol/L (ref 135–146)
Total Bilirubin: 0.8 mg/dL (ref 0.2–1.2)
Total Protein: 6.9 g/dL (ref 6.1–8.1)
eGFR: 59 mL/min/{1.73_m2} — ABNORMAL LOW (ref >=60)

## 2023-03-23 LAB — LIPID PANEL
Cholesterol: 175 mg/dL (ref <200)
HDL: 42 mg/dL (ref >=40)
LDL Cholesterol (Calc): 110 mg/dL — ABNORMAL HIGH
Non-HDL Cholesterol (Calc): 133 mg/dL — ABNORMAL HIGH (ref <130)
Total CHOL/HDL Ratio: 4.2 (calc) (ref <5.0)
Triglycerides: 123 mg/dL (ref <150)

## 2023-03-23 LAB — MAGNESIUM: Magnesium: 2.4 mg/dL (ref 1.5–2.5)

## 2023-03-24 ENCOUNTER — Encounter: Payer: Self-pay | Admitting: Nurse Practitioner

## 2023-05-11 ENCOUNTER — Other Ambulatory Visit: Payer: Self-pay

## 2023-05-11 DIAGNOSIS — K219 Gastro-esophageal reflux disease without esophagitis: Secondary | ICD-10-CM

## 2023-05-11 MED ORDER — FAMOTIDINE 20 MG PO TABS: ORAL_TABLET | ORAL | 0 refills | Status: DC

## 2023-06-08 ENCOUNTER — Ambulatory Visit: Payer: Medicare HMO | Admitting: Family Medicine

## 2023-06-16 ENCOUNTER — Encounter: Payer: Medicare HMO | Admitting: Internal Medicine

## 2023-06-22 ENCOUNTER — Encounter: Payer: Self-pay | Admitting: Family Medicine

## 2023-06-22 ENCOUNTER — Ambulatory Visit (INDEPENDENT_AMBULATORY_CARE_PROVIDER_SITE_OTHER): Admitting: Family Medicine

## 2023-06-22 ENCOUNTER — Encounter: Payer: Medicare HMO | Admitting: Internal Medicine

## 2023-06-22 VITALS — BP 162/94 | HR 66 | Temp 97.6°F | Ht 66.0 in | Wt 156.0 lb

## 2023-06-22 DIAGNOSIS — C61 Malignant neoplasm of prostate: Secondary | ICD-10-CM | POA: Diagnosis not present

## 2023-06-22 DIAGNOSIS — I700 Atherosclerosis of aorta: Secondary | ICD-10-CM

## 2023-06-22 DIAGNOSIS — M21372 Foot drop, left foot: Secondary | ICD-10-CM

## 2023-06-22 DIAGNOSIS — E782 Mixed hyperlipidemia: Secondary | ICD-10-CM

## 2023-06-22 DIAGNOSIS — I10 Essential (primary) hypertension: Secondary | ICD-10-CM | POA: Diagnosis not present

## 2023-06-22 DIAGNOSIS — K219 Gastro-esophageal reflux disease without esophagitis: Secondary | ICD-10-CM

## 2023-06-22 NOTE — Progress Notes (Signed)
 Assessment/Plan:  Total time spent caring for the patient today was 46 minutes. This includes time spent before the visit reviewing the chart, time spent during the visit, and time spent after the visit on documentation, etc.  Assessment & Plan Hypertension Blood pressure is elevated at 162/94 mmHg, but home readings are well-managed in the 130s/80s. Currently on metoprolol tartrate 50 mg BID. No chest pain or dyspnea reported. - Continue metoprolol tartrate 50 mg BID - Follow up in three months for blood pressure re-evaluation  Aortic Atherosclerosis and Hyperlipidemia Aortic atherosclerosis and hyperlipidemia with statin intolerance due to myalgias and inability to tolerate Zetia. Currently on omega-3 fatty acids, fish oil, and flaxseed oil. On aspirin 81 mg daily for primary cardiac prevention. - Continue omega-3 fatty acids, fish oil, and flaxseed oil - Continue aspirin 81 mg daily  Gastroesophageal Reflux Disease (GERD) Symptoms are well-controlled with pantoprazole 20 mg in the AM and famotidine 20 mg in the PM. - Continue pantoprazole 20 mg in the AM - Continue famotidine 20 mg in the PM  Left Drop Foot Left drop foot with no identified cause after neurology evaluation. - Continue brace and ambulating with cane.   Prostate Cancer, status post treatment Prostate cancer treated in 2020. Currently asymptomatic with no active issues.  General Health Maintenance Exercises regularly by walking, uses a cane. Follows with ophthalmology for routine vision screens.  Follow-up Ongoing management of chronic conditions and lab work. - Follow up in three months for fasting lab work and re-evaluation of blood pressure and other chronic conditions      There are no discontinued medications.  Return in about 3 months (around 09/21/2023) for physical (fasting labs).    Subjective:   Encounter date: 06/22/2023  Matthew Aguirre is a 89 y.o. male who has Essential hypertension;  Hyperlipidemia; Vitamin D deficiency; Medication management; Malignant neoplasm of prostate (HCC); Pulmonary sarcoidosis (1988) ; Other abnormal glucose; BMI 25.0-25.9,adult; Plantar fasciitis of left foot; Gastroesophageal reflux disease without esophagitis; Aortic atherosclerosis (HCC) by CT scan 2021; Left foot drop; and Radiation-induced lumbosacral plexopathy on their problem list..   He  has a past medical history of Essential hypertension (02/15/2007), Hyperlipidemia (07/13/2013), Prediabetes (07/13/2013), Prostate cancer [facility]), Pulmonary nodule (2008 resolved on f/u)  (02/15/2007), and Vitamin D deficiency (07/13/2013).Matthew Aguirre   He presents with chief complaint of Establish Care (No concerns) .   Discussed the use of AI scribe software for clinical note transcription with the patient, who gave verbal consent to proceed.  History of Present Illness Matthew Aguirre is an 89 year old male who presents to establish care.  He has a history of hypertension with current office blood pressure readings of 162/94 mmHg, although his home readings are stable in the 130s/80s. He is currently taking metoprolol tartrate 50 mg twice daily. No chest pain or shortness of breath.  He has a history of gastroesophageal reflux disease (GERD) and manages his symptoms with pantoprazole 20 mg in the morning and famotidine 20 mg in the evening. His symptoms are well controlled with this regimen.  He has a history of aortic atherosclerosis and hyperlipidemia. He is statin intolerant due to myalgias and could not tolerate Zetia. He manages his cholesterol with omega-3 fatty acids, fish oil, and flaxseed oil, and takes aspirin 81 mg daily for primary cardiac prevention.  He has a history of left drop foot managed with a brace. He has previously seen neurology, but no cause was identified. He exercises regularly by walking and uses  a cane.  He has a history of prostate cancer, status post treatment in 2020, and reports doing  very well overall.  He engages in regular exercise, walking with the use of a cane, and follows up with ophthalmology for routine vision screenings.   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  Currently place at his [employer].    Past Surgical History:  Procedure Laterality Date   PROSTATE BIOPSY  2020   PROSTATE BIOPSY  2014   TONSILECTOMY/ADENOIDECTOMY WITH MYRINGOTOMY     TREATMENT FISTULA ANAL      Outpatient Medications Prior to Visit  Medication Sig Dispense Refill   acetaminophen (TYLENOL) 500 MG tablet Take 500 mg by mouth as needed (prn).      aspirin 81 MG EC tablet Take by mouth.     Cholecalciferol (VITAMIN D PO) Take 2,000 Int'l Units by mouth 2 (two) times daily.      Cyanocobalamin (VITAMIN B12 PO) Take by mouth daily.     famotidine (PEPCID) 20 MG tablet Take 1 tablet 2 x / day to Prevent Heartburn & Indigestion . 180 tablet 0   Flaxseed, Linseed, (FLAX SEED OIL PO) Take 1,200 mg by mouth 3 (three) times daily.      Magnesium 250 MG TABS Take 250 mg by mouth daily.     metoprolol tartrate (LOPRESSOR) 50 MG tablet Take  1 tablet  2 x / day (every 12 hours) for BP                                                                              /                                   take                                 by                                    mouth 180 tablet 3   Omega-3 Fatty Acids (FISH OIL PO) Take 1,200 mg by mouth 2 (two) times daily.      pantoprazole (PROTONIX) 20 MG tablet Take  1 tablet  Daily  to Prevent Heartburn & Indigestion                                                       /                                                        TAKE  BY                                             MOUTH 90 tablet 3   No facility-administered medications prior to visit.    Family History  Problem Relation Age of Onset   Hyperlipidemia Brother    Hypertension Brother    Breast cancer Neg Hx    Colon cancer Neg Hx     Prostate cancer Neg Hx    Pancreatic cancer Neg Hx     Social History   Socioeconomic History   Marital status: Widowed    Spouse name: Not on file   Number of children: Not on file   Years of education: Not on file   Highest education level: Not on file  Occupational History    Comment: retired  Tobacco Use   Smoking status: Never   Smokeless tobacco: Never  Vaping Use   Vaping status: Never Used  Substance and Sexual Activity   Alcohol use: No   Drug use: Never   Sexual activity: Not Currently  Other Topics Concern   Not on file  Social History Narrative   Not on file   Social Drivers of Health   Financial Resource Strain: Not on file  Food Insecurity: Not on file  Transportation Needs: Not on file  Physical Activity: Not on file  Stress: Not on file  Social Connections: Not on file  Intimate Partner Violence: Not on file                                                                                                  Objective:  Physical Exam: BP (!) 162/94   Pulse 66   Temp 97.6 F (36.4 C) (Temporal)   Ht 5\' 6"  (1.676 m)   Wt 156 lb (70.8 kg)   SpO2 97%   BMI 25.18 kg/m     Physical Exam Constitutional:      Appearance: Normal appearance.  HENT:     Head: Normocephalic and atraumatic.     Right Ear: Hearing normal.     Left Ear: Hearing normal.     Nose: Nose normal.  Eyes:     General: No scleral icterus.       Right eye: No discharge.        Left eye: No discharge.     Extraocular Movements: Extraocular movements intact.  Cardiovascular:     Rate and Rhythm: Normal rate and regular rhythm.     Heart sounds: Normal heart sounds.  Pulmonary:     Effort: Pulmonary effort is normal.     Breath sounds: Normal breath sounds.  Abdominal:     Palpations: Abdomen is soft.     Tenderness: There is no abdominal tenderness.  Skin:    General: Skin is warm.     Findings: No rash.  Neurological:     General: No focal deficit present.      Mental Status: He is alert.  Cranial Nerves: No cranial nerve deficit.     Motor: Weakness (left foot drop) present.     Comments: Uses cane  Psychiatric:        Mood and Affect: Mood normal.        Behavior: Behavior normal.        Thought Content: Thought content normal.        Judgment: Judgment normal.     No results found.  No results found for this or any previous visit (from the past 2160 hours).      Garner Nash, MD, MS

## 2023-06-22 NOTE — Patient Instructions (Signed)
 VISIT SUMMARY:  Today, you came in to establish care. We reviewed your history of hypertension, GERD, aortic atherosclerosis, hyperlipidemia, left drop foot, and prostate cancer. Your blood pressure was elevated in the office, but your home readings are stable. Your GERD symptoms are well controlled, and you are managing your cholesterol with non-statin options due to intolerance. You are doing well post-treatment for prostate cancer and continue to exercise regularly.  YOUR PLAN:  -HYPERTENSION: Hypertension means high blood pressure. Your office reading was 162/94 mmHg, but your home readings are stable in the 130s/80s. Continue taking metoprolol tartrate 50 mg twice daily. We will re-evaluate your blood pressure in three months.  -AORTIC ATHEROSCLEROSIS AND HYPERLIPIDEMIA: Aortic atherosclerosis is the hardening of the aorta due to plaque buildup, and hyperlipidemia means high cholesterol levels. You are intolerant to statins and Zetia, so continue managing your cholesterol with omega-3 fatty acids, fish oil, and flaxseed oil. Also, continue taking aspirin 81 mg daily for heart health.  -GASTROESOPHAGEAL REFLUX DISEASE (GERD): GERD is a condition where stomach acid frequently flows back into the tube connecting your mouth and stomach. Your symptoms are well controlled with pantoprazole 20 mg in the morning and famotidine 20 mg in the evening. Continue this regimen.  -LEFT DROP FOOT: Left drop foot is a condition where you have difficulty lifting the front part of your foot. No cause was identified after a neurology evaluation. Continue your regular exercise and use of a cane as needed.  -PROSTATE CANCER, STATUS POST TREATMENT: You had prostate cancer that was treated in 2020. You are currently asymptomatic and have no active issues related to this condition.  -GENERAL HEALTH MAINTENANCE: You are doing well with regular exercise and routine vision screenings with ophthalmology. Continue these  healthy practices.  INSTRUCTIONS:  Please follow up in three months for fasting lab work and re-evaluation of your blood pressure and other chronic conditions.

## 2023-07-06 DIAGNOSIS — H52223 Regular astigmatism, bilateral: Secondary | ICD-10-CM | POA: Diagnosis not present

## 2023-07-12 ENCOUNTER — Other Ambulatory Visit: Payer: Self-pay | Admitting: Family Medicine

## 2023-07-12 DIAGNOSIS — R000 Tachycardia, unspecified: Secondary | ICD-10-CM

## 2023-07-12 NOTE — Telephone Encounter (Signed)
 Copied from CRM [id number]. Topic: Clinical - Medication Refill >> Jul 12, 2023 10:46 AM Ovid Blow wrote: Most Recent Primary Care Visit:  Provider: Catheryn Cluck  Department: [facility]  Visit Type: NEW PATIENT  Date: 06/22/2023  Medication: metoprolol  tartrate (LOPRESSOR ) 50 MG tablet   Has the patient contacted their pharmacy? Yes (Agent: If no, request that the patient contact the pharmacy for the refill. If patient does not wish to contact the pharmacy document the reason why and proceed with request.) (Agent: If yes, when and what did the pharmacy advise?)  Is this the correct pharmacy for this prescription? Yes If no, delete pharmacy and type the correct one.  This is the patient's preferred pharmacy:   [facility] [address] - [place], [place] - [address] AT [facility] [address] [id number]  Has the prescription been filled recently? No  Is the patient out of the medication? No  Has the patient been seen for an appointment in the last year OR does the patient have an upcoming appointment? Yes  Can we respond through MyChart? Yes  Agent: Please be advised that Rx refills may take up to 3 business days. We ask that you follow-up with your pharmacy.

## 2023-07-14 ENCOUNTER — Telehealth: Payer: Self-pay | Admitting: Family Medicine

## 2023-07-14 ENCOUNTER — Other Ambulatory Visit: Payer: Self-pay | Admitting: Family Medicine

## 2023-07-14 DIAGNOSIS — R000 Tachycardia, unspecified: Secondary | ICD-10-CM

## 2023-07-14 MED ORDER — METOPROLOL TARTRATE 50 MG PO TABS: 50.0000 mg | ORAL_TABLET | Freq: Two times a day (BID) | ORAL | 3 refills | Status: AC

## 2023-07-14 NOTE — Telephone Encounter (Signed)
 Prescription Request  07/14/2023  LOV: 06/22/2023  What is the name of the medication or equipment? metoprolol  tartrate (LOPRESSOR ) 50 MG tablet [[id number]]    Have you contacted your pharmacy to request a refill? Yes   Which pharmacy would you like this sent to?  [facility] [address] [place], [place] - [address] AT [facility] [address] [place]. [place] [address] Phone: [phone] Fax: [phone]   Please advise at Mobile [phone] (mobile)     Pt said his previous pcp use to prescribe this meds to him but now he is deceased

## 2023-07-14 NOTE — Telephone Encounter (Signed)
 error

## 2023-07-20 ENCOUNTER — Ambulatory Visit: Admitting: Family Medicine

## 2023-08-02 ENCOUNTER — Other Ambulatory Visit: Payer: Self-pay | Admitting: Family Medicine

## 2023-08-02 DIAGNOSIS — K219 Gastro-esophageal reflux disease without esophagitis: Secondary | ICD-10-CM

## 2023-08-02 NOTE — Telephone Encounter (Signed)
 Copied from CRM [id number]. Topic: Clinical - Medication Refill >> Aug 02, 2023  9:06 AM Matthew Aguirre wrote: Medication:  pantoprazole  (PROTONIX ) 20 MG tablet   Has the patient contacted their pharmacy? Yes (Agent: If no, request that the patient contact the pharmacy for the refill. If patient does not wish to contact the pharmacy document the reason why and proceed with request.) (Agent: If yes, when and what did the pharmacy advise?)  This is the patient's preferred pharmacy:  [id number] [place] - [place], [place] - [phone] [place]. AT [facility] [address] [place]. [place] [address] Phone: [phone] Fax: [phone]   Is this the correct pharmacy for this prescription? Yes If no, delete pharmacy and type the correct one.   Has the prescription been filled recently? No  Is the patient out of the medication? No have enough for the week  Has the patient been seen for an appointment in the last year OR does the patient have an upcoming appointment? Yes  Can we respond through MyChart? No  Agent: Please be advised that Rx refills may take up to 3 business days. We ask that you follow-up with your pharmacy.

## 2023-08-03 MED ORDER — PANTOPRAZOLE SODIUM 20 MG PO TBEC: DELAYED_RELEASE_TABLET | ORAL | 3 refills | Status: AC

## 2023-09-14 ENCOUNTER — Ambulatory Visit: Payer: Medicare HMO | Admitting: Nurse Practitioner

## 2023-09-21 ENCOUNTER — Telehealth: Payer: Self-pay

## 2023-09-21 ENCOUNTER — Encounter: Payer: Self-pay | Admitting: Family Medicine

## 2023-09-21 ENCOUNTER — Ambulatory Visit (INDEPENDENT_AMBULATORY_CARE_PROVIDER_SITE_OTHER): Admitting: Family Medicine

## 2023-09-21 ENCOUNTER — Other Ambulatory Visit (HOSPITAL_COMMUNITY): Payer: Self-pay

## 2023-09-21 VITALS — BP 168/82 | HR 63 | Temp 97.6°F | Ht 66.0 in | Wt 152.0 lb

## 2023-09-21 DIAGNOSIS — M21372 Foot drop, left foot: Secondary | ICD-10-CM

## 2023-09-21 DIAGNOSIS — E538 Deficiency of other specified B group vitamins: Secondary | ICD-10-CM

## 2023-09-21 DIAGNOSIS — I700 Atherosclerosis of aorta: Secondary | ICD-10-CM

## 2023-09-21 DIAGNOSIS — N1831 Chronic kidney disease, stage 3a: Secondary | ICD-10-CM | POA: Diagnosis not present

## 2023-09-21 DIAGNOSIS — R7309 Other abnormal glucose: Secondary | ICD-10-CM

## 2023-09-21 DIAGNOSIS — D860 Sarcoidosis of lung: Secondary | ICD-10-CM | POA: Diagnosis not present

## 2023-09-21 DIAGNOSIS — K219 Gastro-esophageal reflux disease without esophagitis: Secondary | ICD-10-CM

## 2023-09-21 DIAGNOSIS — Z79899 Other long term (current) drug therapy: Secondary | ICD-10-CM | POA: Diagnosis not present

## 2023-09-21 DIAGNOSIS — Z0000 Encounter for general adult medical examination without abnormal findings: Secondary | ICD-10-CM

## 2023-09-21 DIAGNOSIS — T466X5A Adverse effect of antihyperlipidemic and antiarteriosclerotic drugs, initial encounter: Secondary | ICD-10-CM

## 2023-09-21 DIAGNOSIS — E559 Vitamin D deficiency, unspecified: Secondary | ICD-10-CM

## 2023-09-21 DIAGNOSIS — C61 Malignant neoplasm of prostate: Secondary | ICD-10-CM

## 2023-09-21 DIAGNOSIS — I10 Essential (primary) hypertension: Secondary | ICD-10-CM

## 2023-09-21 DIAGNOSIS — E782 Mixed hyperlipidemia: Secondary | ICD-10-CM

## 2023-09-21 DIAGNOSIS — G720 Drug-induced myopathy: Secondary | ICD-10-CM

## 2023-09-21 LAB — CBC WITH DIFFERENTIAL/PLATELET
Basophils Absolute: 0 K/uL (ref 0.0–0.1)
Basophils Relative: 0.6 % (ref 0.0–3.0)
Eosinophils Absolute: 0 K/uL (ref 0.0–0.7)
Eosinophils Relative: 1 % (ref 0.0–5.0)
HCT: 51.2 % (ref 39.0–52.0)
Hemoglobin: 17 g/dL (ref 13.0–17.0)
Lymphocytes Relative: 15.9 % (ref 12.0–46.0)
Lymphs Abs: 0.7 K/uL (ref 0.7–4.0)
MCHC: 33.3 g/dL (ref 30.0–36.0)
MCV: 90.6 fl (ref 78.0–100.0)
Monocytes Absolute: 0.5 K/uL (ref 0.1–1.0)
Monocytes Relative: 10.2 % (ref 3.0–12.0)
Neutro Abs: 3.3 K/uL (ref 1.4–7.7)
Neutrophils Relative %: 72.3 % (ref 43.0–77.0)
Platelets: 146 K/uL — ABNORMAL LOW (ref 150.0–400.0)
RBC: 5.66 Mil/uL (ref 4.22–5.81)
RDW: 13.2 % (ref 11.5–15.5)
WBC: 4.6 K/uL (ref 4.0–10.5)

## 2023-09-21 LAB — LIPID PANEL
Cholesterol: 160 mg/dL (ref 0–200)
HDL: 33.6 mg/dL — ABNORMAL LOW (ref >39.00)
LDL Cholesterol: 102 mg/dL — ABNORMAL HIGH (ref 0–99)
NonHDL: 126.19
Total CHOL/HDL Ratio: 5
Triglycerides: 121 mg/dL (ref 0.0–149.0)
VLDL: 24.2 mg/dL (ref 0.0–40.0)

## 2023-09-21 LAB — B12 AND FOLATE PANEL
Folate: 20.9 ng/mL (ref >5.9)
Vitamin B-12: 1095 pg/mL — ABNORMAL HIGH (ref 211–911)

## 2023-09-21 LAB — HIGH SENSITIVITY CRP: CRP, High Sensitivity: 1.67 mg/L (ref 0.000–5.000)

## 2023-09-21 LAB — VITAMIN D 25 HYDROXY (VIT D DEFICIENCY, FRACTURES): VITD: 84.58 ng/mL (ref 30.00–100.00)

## 2023-09-21 LAB — TSH: TSH: 1.03 u[IU]/mL (ref 0.35–5.50)

## 2023-09-21 LAB — PSA: PSA: 0.35 ng/mL (ref 0.10–4.00)

## 2023-09-21 LAB — SEDIMENTATION RATE: Sed Rate: 2 mm/h (ref 0–20)

## 2023-09-21 LAB — MAGNESIUM: Magnesium: 2.2 mg/dL (ref 1.5–2.5)

## 2023-09-21 LAB — HEMOGLOBIN A1C: Hgb A1c MFr Bld: 6 % (ref 4.6–6.5)

## 2023-09-21 MED ORDER — REPATHA SURECLICK 140 MG/ML ~~LOC~~ SOAJ: 140.0000 mg | SUBCUTANEOUS | 0 refills | Status: DC

## 2023-09-21 NOTE — Progress Notes (Signed)
 Assessment  Assessment/Plan:  Assessment and Plan Assessment & Plan Hypertension Elevated office blood pressure (168/82 mmHg) with lower home readings (99-123/68-77 mmHg), suggesting possible white coat hypertension. Managed with metoprolol  succinate 50 mg twice daily. No chest pain or dyspnea. Discrepancy between office and home readings necessitates further evaluation. Home readings are more reliable for management. He is advised to bring his home blood pressure cuff for accuracy comparison. - Bring home blood pressure cuff for comparison - Monitor home blood pressure and maintain a log - Recheck blood pressure in office - Order thyroid  function tests - Order urinalysis, microalbumin, creatinine ratio - Order complete metabolic panel  Chronic Kidney Disease Stage 3A CKD Stage 3A requires renal function assessment. - Order urinalysis, microalbumin, creatinine ratio - Order complete metabolic panel  Aortic Atherosclerosis Aortic atherosclerosis identified on 2021 CT scan. Requires ASCVD prevention. Despite good cholesterol levels, treatment is recommended due to plaque buildup. Aspirin provides some protection; additional treatments are under consideration. - Order fasting lipid panel - Continue ASA 81 mg daily  Hyperlipidemia Mixed hyperlipidemia. Intolerant to statins and Zetia due to myalgia. Managed with fish oil and lifestyle changes. Considering PSK9 inhibitors, a third-line option, which may be costly and require prescription assistance. Administered via injection biweekly to monthly when other treatments are intolerable. - Order fasting lipid panel - Consider PSK9 inhibitors (Repatha  140 mg every 14 days) - Contact pharmacy team for prescription assistance  Prostate Cancer Stage 3B prostate cancer diagnosed in 2021. Monitoring PSA for recurrence. Previous PSA was 0.01, indicating slow progression. - Order PSA - Order urinalysis  Pulmonary Sarcoidosis Pulmonary  sarcoidosis requires monitoring of inflammatory markers. - Order angiotensin converting enzyme - Order ESR - Order CRP - Order complete metabolic panel  Prediabetes Prediabetes requires monitoring of blood glucose levels. - Order fasting blood glucose - Order hemoglobin A1c - Order random fasting insulin   Vitamin D  Deficiency Vitamin D  deficiency managed with supplementation. - Order vitamin D  level  Vitamin B12 Deficiency Vitamin B12 deficiency managed with supplementation. - Order CBC - Order vitamin B12 level - Order iron labs  Magnesium Deficiency Magnesium deficiency requires monitoring. - Order magnesium level  General Health Maintenance Vaccinations, including SARS-CoV, are up to date. Engages in regular physical activity and lifestyle modifications.  Follow-up Follow-up needed to reassess blood pressure and discuss potential new treatments. Monitor for cardiac or stroke symptoms and seek emergency care if he occurs. - Follow up in one month with blood pressure cuff and log - Monitor for cardiac or stroke symptoms and seek emergency care if he occurs    The ASCVD Risk score (Arnett DK, et al., 2019) failed to calculate for the following reasons:   The 2019 ASCVD risk score is only valid for ages 42 to 40  There are no discontinued medications.  Patient Counseling(The following topics were reviewed and/or handout was given):  -Nutrition: Stressed importance of moderation in sodium/caffeine intake, saturated fat and cholesterol, caloric balance, sufficient intake of fresh fruits, vegetables, and fiber.  -Stressed the importance of regular exercise.   -Substance Abuse: Discussed cessation/primary prevention of tobacco, alcohol, or other drug use; driving or other dangerous activities under the influence; availability of treatment for abuse.   -Injury prevention: Discussed safety belts, safety helmets, smoke detector, smoking near bedding or upholstery.   -Sexuality:  Discussed sexually transmitted diseases, partner selection, use of condoms, avoidance of unintended pregnancy and contraceptive alternatives.   -Dental health: Discussed importance of regular tooth brushing, flossing, and dental visits.  -Health  maintenance and immunizations reviewed. Please refer to Health maintenance section.  Return in about 1 month (around 10/22/2023) for blood pressure.        Subjective:   Encounter date: 09/21/2023  Chief Complaint  Patient presents with   Annual Exam    Physical; no concerns    Discussed the use of AI scribe software for clinical note transcription with the patient, who gave verbal consent to proceed.  History of Present Illness Matthew Aguirre is an 89 year old male who presents for an annual physical exam.  He presents with a discrepancy between his blood pressure readings at home and those taken in the office. In the office, his blood pressure was recorded at 168/82 mmHg, while at home, his readings range from 99/123 to 99/126 over 68 to 77 mmHg. He did not bring his home blood pressure cuff for comparison. He is currently managing his hypertension with metoprolol  succinate 50 mg twice daily. No chest pain, shortness of breath, or symptoms suggestive of cardiac or stroke events.  He has a history of chronic kidney disease stage 3A, prediabetes, vitamin D  deficiency, and B12 deficiency. He takes supplements for vitamin D  and B12. His prediabetes is monitored with fasting blood glucose and hemoglobin A1c tests.  He has a history of prostate cancer, stage 3B diagnosed in 2021, and is monitored with PSA tests. Recent PSA levels have been low.  He also has a history of pulmonary sarcoidosis, hyperlipidemia, and aortic atherosclerosis noted on a CT scan in 2021. He manages his hyperlipidemia with fish oil due to an allergy to Zetia and statins causing myalgia. He takes aspirin 81 mg daily for cardiovascular protection.  He has a history of  magnesium deficiency and is monitored for this condition.  He remains active, walking approximately three-quarters of a mile most mornings and sometimes in the evenings. 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  Review of Systems completed and negative except as stated above in the HPI (Systems reviewed: HENT, Eyes, Resp, CV, GI, GU, MSK, Skin, Neuro, Psych)      09/21/2023    9:32 AM 06/22/2023    1:25 PM 03/22/2023   11:15 AM 12/16/2022   12:03 AM 09/09/2022   11:54 AM  Depression screen PHQ 2/9  Decreased Interest 0 0 0 0 0  Down, Depressed, Hopeless 0 0 0 0 0  PHQ - 2 Score 0 0 0 0 0  Altered sleeping 0 0     Tired, decreased energy 0 0     Change in appetite 0 0     Feeling bad or failure about yourself  0 0     Trouble concentrating 0 0     Moving slowly or fidgety/restless 0 0     Suicidal thoughts 0 0     PHQ-9 Score 0 0     Difficult doing work/chores Not difficult at all Not difficult at all          09/21/2023    9:32 AM 06/22/2023    1:25 PM  GAD 7 : Generalized Anxiety Score  Nervous, Anxious, on Edge 0 0  Control/stop worrying 0 0  Worry too much - different things 0 0  Trouble relaxing 0 0  Restless 0 0  Easily annoyed or irritable 0 0  Afraid - awful might happen 0 0  Total GAD 7 Score 0 0  Anxiety Difficulty Not  difficult at all Not difficult at all    Health Maintenance Due  Topic Date Due   COVID-19 Vaccine (3 - Moderna risk series) 03/26/2020       PMH:  The following were reviewed and entered/updated in epic: Past Medical History:  Diagnosis Date   Essential hypertension 02/15/2007   Hyperlipidemia 07/13/2013   Prediabetes 07/13/2013   Prostate cancer (HCC)    Pulmonary nodule (2008 resolved on f/u)  02/15/2007   Vitamin D  deficiency 07/13/2013    Patient Active Problem List   Diagnosis Date Noted   Radiation-induced lumbosacral plexopathy 05/15/2020   Left  foot drop 05/13/2020   Aortic atherosclerosis (HCC) by CT scan 2021 09/28/2019   Plantar fasciitis of left foot 09/21/2018   Gastroesophageal reflux disease without esophagitis 09/21/2018   Other abnormal glucose 02/13/2015   BMI 25.0-25.9,adult 02/13/2015   Pulmonary sarcoidosis (1988)  07/29/2014   Malignant neoplasm of prostate (HCC) 04/19/2014   Hyperlipidemia, mixed 07/13/2013   Vitamin D  deficiency 07/13/2013   Medication management 07/13/2013   Essential hypertension 02/15/2007    Past Surgical History:  Procedure Laterality Date   PROSTATE BIOPSY  2020   PROSTATE BIOPSY  2014   TONSILECTOMY/ADENOIDECTOMY WITH MYRINGOTOMY     TREATMENT FISTULA ANAL      Family History  Problem Relation Age of Onset   Hyperlipidemia Brother    Hypertension Brother    Breast cancer Neg Hx    Colon cancer Neg Hx    Prostate cancer Neg Hx    Pancreatic cancer Neg Hx     Medications- reviewed and updated Outpatient Medications Prior to Visit  Medication Sig Dispense Refill   acetaminophen (TYLENOL) 500 MG tablet Take 500 mg by mouth as needed (prn).      aspirin 81 MG EC tablet Take by mouth.     Cholecalciferol (VITAMIN D  PO) Take 2,000 Int'l Units by mouth 2 (two) times daily.      Cyanocobalamin (VITAMIN B12 PO) Take by mouth daily.     famotidine  (PEPCID ) 20 MG tablet Take 1 tablet 2 x / day to Prevent Heartburn & Indigestion . (Patient taking differently: Take 20 mg by mouth daily. Take 1 tablet 2 x / day to Prevent Heartburn & Indigestion .) 180 tablet 0   Flaxseed, Linseed, (FLAX SEED OIL PO) Take 1,200 mg by mouth 3 (three) times daily.      Magnesium 250 MG TABS Take 250 mg by mouth daily.     metoprolol  tartrate (LOPRESSOR ) 50 MG tablet Take 1 tablet (50 mg total) by mouth 2 (two) times daily. 180 tablet 3   Omega-3 Fatty Acids (FISH OIL PO) Take 1,200 mg by mouth 2 (two) times daily.      pantoprazole  (PROTONIX ) 20 MG tablet Take  1 tablet  Daily  to Prevent Heartburn &  Indigestion                                                       /                                                        TAKE  BY                                             MOUTH 90 tablet 3   No facility-administered medications prior to visit.    Allergies  Allergen Reactions   Pravastatin    Red Yeast Rice [Cholestin]    Sulfa Antibiotics    Vibramycin [Doxycycline Calcium]    Zetia [Ezetimibe]     Social History   Socioeconomic History   Marital status: Widowed    Spouse name: Not on file   Number of children: Not on file   Years of education: Not on file   Highest education level: Not on file  Occupational History    Comment: retired  Tobacco Use   Smoking status: Never   Smokeless tobacco: Never  Vaping Use   Vaping status: Never Used  Substance and Sexual Activity   Alcohol use: No   Drug use: Never   Sexual activity: Not Currently  Other Topics Concern   Not on file  Social History Narrative   Not on file   Social Drivers of Health   Financial Resource Strain: Not on file  Food Insecurity: Not on file  Transportation Needs: Not on file  Physical Activity: Not on file  Stress: Not on file  Social Connections: Not on file           Objective:  Physical Exam: BP (!) 168/82 (BP Location: Right Arm, Patient Position: Sitting, Cuff Size: Large)   Pulse 63   Temp 97.6 F (36.4 C)   Ht 5' 6 (1.676 m)   Wt 152 lb (68.9 kg)   SpO2 95%   BMI 24.53 kg/m   Body mass index is 24.53 kg/m. Wt Readings from Last 3 Encounters:  09/21/23 152 lb (68.9 kg)  06/22/23 156 lb (70.8 kg)  03/22/23 156 lb 12.8 oz (71.1 kg)    Physical Exam Constitutional:      General: He is not in acute distress.    Appearance: Normal appearance. He is not ill-appearing or toxic-appearing.  HENT:     Head: Normocephalic and atraumatic.     Right Ear: Hearing, tympanic membrane, ear canal and external ear normal. There is no impacted  cerumen.     Left Ear: Hearing, tympanic membrane, ear canal and external ear normal. There is no impacted cerumen.     Nose: Nose normal. No congestion.     Mouth/Throat:     Lips: No lesions.     Mouth: Mucous membranes are moist.     Pharynx: Oropharynx is clear. No oropharyngeal exudate.  Eyes:     General: No scleral icterus.       Right eye: No discharge.        Left eye: No discharge.     Extraocular Movements: Extraocular movements intact.     Conjunctiva/sclera: Conjunctivae normal.     Pupils: Pupils are equal, round, and reactive to light.  Neck:     Thyroid : No thyroid  mass, thyromegaly or thyroid  tenderness.  Cardiovascular:     Rate and Rhythm: Normal rate and regular rhythm.     Pulses: Normal pulses.     Heart sounds: Normal heart sounds.  Pulmonary:     Effort: Pulmonary effort is normal. No respiratory distress.     Breath sounds: Normal breath sounds.  Abdominal:     General:  Abdomen is flat. Bowel sounds are normal.     Palpations: Abdomen is soft.     Tenderness: There is no abdominal tenderness.  Musculoskeletal:        General: Normal range of motion.     Cervical back: Normal range of motion.     Right lower leg: No edema.     Left lower leg: No edema.  Lymphadenopathy:     Cervical: No cervical adenopathy.  Skin:    General: Skin is warm and dry.     Findings: No rash.  Neurological:     General: No focal deficit present.     Mental Status: He is alert and oriented to person, place, and time. Mental status is at baseline.     Cranial Nerves: No cranial nerve deficit.     Motor: Weakness (left foot drop) present.     Deep Tendon Reflexes:     Reflex Scores:      Patellar reflexes are 2+ on the right side and 2+ on the left side.    Comments: Uses cane  Psychiatric:        Mood and Affect: Mood normal.        Behavior: Behavior normal.        Thought Content: Thought content normal.        Judgment: Judgment normal.         Prior labs:    No results found for this or any previous visit (from the past 2160 hours).  Lab Results  Component Value Date   CHOL 175 03/22/2023   CHOL 165 12/15/2022   CHOL 176 09/09/2022   Lab Results  Component Value Date   HDL 42 03/22/2023   HDL 35 (L) 12/15/2022   HDL 41 09/09/2022   Lab Results  Component Value Date   LDLCALC 110 (H) 03/22/2023   LDLCALC 104 (H) 12/15/2022   LDLCALC 109 (H) 09/09/2022   Lab Results  Component Value Date   TRIG 123 03/22/2023   TRIG 146 12/15/2022   TRIG 146 09/09/2022   Lab Results  Component Value Date   CHOLHDL 4.2 03/22/2023   CHOLHDL 4.7 12/15/2022   CHOLHDL 4.3 09/09/2022   No results found for: LDLDIRECT  Last metabolic panel Lab Results  Component Value Date   GLUCOSE 77 03/22/2023   NA 140 03/22/2023   K 4.6 03/22/2023   CL 105 03/22/2023   CO2 24 03/22/2023   BUN 16 03/22/2023   CREATININE 1.20 03/22/2023   EGFR 59 (L) 03/22/2023   CALCIUM 9.5 03/22/2023   PROT 6.9 03/22/2023   ALBUMIN 4.0 07/13/2016   BILITOT 0.8 03/22/2023   ALKPHOS 61 07/13/2016   AST 27 03/22/2023   ALT 34 03/22/2023    Lab Results  Component Value Date   HGBA1C 5.8 (H) 12/15/2022    Last CBC Lab Results  Component Value Date   WBC 5.9 03/22/2023   HGB 17.2 (H) 03/22/2023   HCT 53.1 (H) 03/22/2023   MCV 90.9 03/22/2023   MCH 29.5 03/22/2023   RDW 12.4 03/22/2023   PLT 166 03/22/2023    Lab Results  Component Value Date   TSH 1.33 12/15/2022    Lab Results  Component Value Date   PSA 0.15 06/08/2022   PSA 7.3 (H) 12/01/2017   PSA 8.1 (H) 10/28/2016    Last vitamin D  Lab Results  Component Value Date   VD25OH 87 12/15/2022    Lab Results  Component Value Date  BILIRUBINUR NEGATIVE 10/28/2016   PROTEINUR NEGATIVE 06/08/2022   LEUKOCYTESUR NEGATIVE 06/08/2022    Lab Results  Component Value Date   MICROALBUR 1.0 06/08/2022   MICROALBUR 0.2 04/08/2021     At today's visit, we discussed treatment options,  associated risk and benefits, and engage in counseling as needed.  Additionally the following were reviewed: Past medical records, past medical and surgical history, family and social background, as well as relevant laboratory results, imaging findings, and specialty notes, where applicable.  This message was generated using dictation software, and as a result, it may contain unintentional typos or errors.  Nevertheless, extensive effort was made to accurately convey at the pertinent aspects of the patient visit.    There may have been are other unrelated non-urgent complaints, but due to the busy schedule and the amount of time already spent with him, time does not permit to address these issues at today's visit. Another appointment may have or has been requested to review these additional issues.     Arvella Hummer, MD, MS

## 2023-09-21 NOTE — Telephone Encounter (Signed)
 Pharmacy Patient Advocate Encounter   Received notification from CoverMyMeds that prior authorization for Repatha  SureClick 140MG /ML auto-injectors  is required/requested.   Insurance verification completed.   The patient is insured through [facility] .   Per test claim: PA required; PA started via CoverMyMeds. KEY [id number] . Waiting for clinical questions to populate.

## 2023-09-21 NOTE — Patient Instructions (Addendum)
  VISIT SUMMARY: Today, you came in for your annual physical exam. We discussed your blood pressure readings, chronic kidney disease, aortic atherosclerosis, hyperlipidemia, prostate cancer, pulmonary sarcoidosis, prediabetes, vitamin D  and B12 deficiencies, and magnesium deficiency. We also reviewed your general health maintenance and lifestyle habits.  YOUR PLAN: -HYPERTENSION: Hypertension means high blood pressure. Your office blood pressure was higher than your home readings, which may suggest white coat hypertension. Please bring your home blood pressure cuff next time for comparison. Continue to monitor your blood pressure at home and keep a log. We will recheck your blood pressure in the office and have ordered some tests to further evaluate your condition.  -CHRONIC KIDNEY DISEASE STAGE 3A: Chronic kidney disease means your kidneys are not working as well as they should. We need to assess your kidney function, so we have ordered a urinalysis, microalbumin, creatinine ratio, and a complete metabolic panel.  -AORTIC ATHEROSCLEROSIS: Aortic atherosclerosis means there is plaque buildup in your aorta. This condition requires prevention of cardiovascular disease. Continue taking aspirin 81 mg daily. We have ordered a fasting lipid panel to check your cholesterol levels.  -HYPERLIPIDEMIA: Hyperlipidemia means you have high levels of fats in your blood. You are managing this with fish oil due to intolerance to other medications. We are considering a new treatment option called PSK9 inhibitors, which may require prescription assistance. We have ordered a fasting lipid panel to evaluate your condition.  -PROSTATE CANCER: You have a history of stage 3B prostate cancer. We are monitoring your PSA levels to check for recurrence. Your previous PSA was very low, indicating slow progression. We have ordered a PSA test and a urinalysis.  -PULMONARY SARCOIDOSIS: Pulmonary sarcoidosis is an inflammatory disease  that affects your lungs. We need to monitor your inflammatory markers, so we have ordered several tests including angiotensin converting enzyme, ESR, CRP, and a complete metabolic panel.  -PREDIABETES: Prediabetes means your blood sugar levels are higher than normal but not high enough to be classified as diabetes. We need to monitor your blood glucose levels, so we have ordered fasting blood glucose, hemoglobin A1c, and random fasting insulin  tests.  -VITAMIN D  DEFICIENCY: Vitamin D  deficiency means you have low levels of vitamin D . You are managing this with supplementation. We have ordered a vitamin D  level test to monitor your condition.  -VITAMIN B12 DEFICIENCY: Vitamin B12 deficiency means you have low levels of vitamin B12. You are managing this with supplementation. We have ordered a CBC, vitamin B12 level, and iron labs to monitor your condition.  -MAGNESIUM DEFICIENCY: Magnesium deficiency means you have low levels of magnesium. We have ordered a magnesium level test to monitor your condition.  -GENERAL HEALTH MAINTE NANCE: Your vaccinations, including SARS-CoV, are up to date. You are engaging in regular physical activity and making lifestyle modifications to maintain your health.  INSTRUCTIONS: Please follow up in one month with your blood pressure cuff and log. Continue to monitor for any cardiac or stroke symptoms and seek emergency care if they occur.                      Contains text generated by Abridge.                                 Contains text generated by Abridge.

## 2023-09-22 ENCOUNTER — Ambulatory Visit: Payer: Self-pay | Admitting: Family Medicine

## 2023-09-22 LAB — NO CULTURE INDICATED

## 2023-09-22 LAB — URINALYSIS W MICROSCOPIC + REFLEX CULTURE
Bacteria, UA: NONE SEEN /HPF
Bilirubin Urine: NEGATIVE
Glucose, UA: NEGATIVE
Hgb urine dipstick: NEGATIVE
Hyaline Cast: NONE SEEN /LPF
Ketones, ur: NEGATIVE
Leukocyte Esterase: NEGATIVE
Nitrites, Initial: NEGATIVE
Protein, ur: NEGATIVE
RBC / HPF: NONE SEEN /HPF (ref 0–2)
Specific Gravity, Urine: 1.014 (ref 1.001–1.035)
Squamous Epithelial / HPF: NONE SEEN /HPF (ref <=5)
WBC, UA: NONE SEEN /HPF (ref 0–5)
pH: >=8.5 — AB (ref 5.0–8.0)

## 2023-09-22 LAB — COMPLETE METABOLIC PANEL WITHOUT GFR
AG Ratio: 1.8 (calc) (ref 1.0–2.5)
ALT: 26 U/L (ref 9–46)
AST: 22 U/L (ref 10–35)
Albumin: 4.4 g/dL (ref 3.6–5.1)
Alkaline phosphatase (APISO): 62 U/L (ref 35–144)
BUN/Creatinine Ratio: 12 (calc) (ref 6–22)
BUN: 17 mg/dL (ref 7–25)
CO2: 22 mmol/L (ref 20–32)
Calcium: 9.5 mg/dL (ref 8.6–10.3)
Chloride: 103 mmol/L (ref 98–110)
Creat: 1.38 mg/dL — ABNORMAL HIGH (ref 0.70–1.22)
Globulin: 2.5 g/dL (ref 1.9–3.7)
Glucose, Bld: 97 mg/dL (ref 65–99)
Potassium: 5.2 mmol/L (ref 3.5–5.3)
Sodium: 138 mmol/L (ref 135–146)
Total Bilirubin: 1.2 mg/dL (ref 0.2–1.2)
Total Protein: 6.9 g/dL (ref 6.1–8.1)

## 2023-09-22 LAB — IRON,TIBC AND FERRITIN PANEL
%SAT: 43 % (ref 20–48)
Ferritin: 48 ng/mL (ref 24–380)
Iron: 156 ug/dL (ref 50–180)
TIBC: 366 ug/dL (ref 250–425)

## 2023-09-22 LAB — ANGIOTENSIN CONVERTING ENZYME: Angiotensin-Converting Enzyme: 29 U/L (ref 9–67)

## 2023-09-22 LAB — INSULIN, RANDOM: Insulin: 11.2 u[IU]/mL

## 2023-09-22 NOTE — Telephone Encounter (Signed)
 PLEASE BE ADVISED Clinical questions have been answered and PA submitted.TO PLAN. PA currently Pending.

## 2023-09-23 NOTE — Telephone Encounter (Signed)
 I called and spoke with patient and notified him that prior auth was approved for Repatha  and pharmacy said that it would be $438.00 and he request for something more affordable.

## 2023-09-23 NOTE — Telephone Encounter (Signed)
 Pharmacy Patient Advocate Encounter  Received notification from [facility] that Prior Authorization for Repatha  SureClick 140MG /ML auto-injectors has been APPROVED from 03/17/2023 to 02/26/2024   PA #/Case ID/Reference #: [id number]

## 2023-09-27 ENCOUNTER — Other Ambulatory Visit (HOSPITAL_COMMUNITY): Payer: Self-pay

## 2023-09-29 ENCOUNTER — Ambulatory Visit: Payer: Medicare HMO | Admitting: Nurse Practitioner

## 2023-10-02 ENCOUNTER — Emergency Department (HOSPITAL_COMMUNITY)

## 2023-10-02 ENCOUNTER — Other Ambulatory Visit: Payer: Self-pay

## 2023-10-02 ENCOUNTER — Emergency Department (HOSPITAL_COMMUNITY)
Admission: EM | Admit: 2023-10-02 | Discharge: 2023-10-02 | Disposition: A | Discharge status: Home / Self Care | Attending: Emergency Medicine | Admitting: Emergency Medicine

## 2023-10-02 ENCOUNTER — Encounter (HOSPITAL_COMMUNITY): Payer: Self-pay

## 2023-10-02 DIAGNOSIS — I10 Essential (primary) hypertension: Secondary | ICD-10-CM | POA: Insufficient documentation

## 2023-10-02 DIAGNOSIS — M25519 Pain in unspecified shoulder: Secondary | ICD-10-CM | POA: Diagnosis not present

## 2023-10-02 DIAGNOSIS — R030 Elevated blood-pressure reading, without diagnosis of hypertension: Secondary | ICD-10-CM | POA: Diagnosis present

## 2023-10-02 DIAGNOSIS — J984 Other disorders of lung: Secondary | ICD-10-CM | POA: Diagnosis not present

## 2023-10-02 DIAGNOSIS — I159 Secondary hypertension, unspecified: Secondary | ICD-10-CM

## 2023-10-02 DIAGNOSIS — R918 Other nonspecific abnormal finding of lung field: Secondary | ICD-10-CM | POA: Diagnosis not present

## 2023-10-02 DIAGNOSIS — R079 Chest pain, unspecified: Secondary | ICD-10-CM | POA: Diagnosis not present

## 2023-10-02 DIAGNOSIS — M549 Dorsalgia, unspecified: Secondary | ICD-10-CM | POA: Diagnosis not present

## 2023-10-02 LAB — CBC WITH DIFFERENTIAL/PLATELET
Abs Immature Granulocytes: 0.06 K/uL (ref 0.00–0.07)
Basophils Absolute: 0 K/uL (ref 0.0–0.1)
Basophils Relative: 1 %
Eosinophils Absolute: 0 K/uL (ref 0.0–0.5)
Eosinophils Relative: 1 %
HCT: 49.8 % (ref 39.0–52.0)
Hemoglobin: 16.7 g/dL (ref 13.0–17.0)
Immature Granulocytes: 1 %
Lymphocytes Relative: 16 %
Lymphs Abs: 0.7 K/uL (ref 0.7–4.0)
MCH: 30.7 pg (ref 26.0–34.0)
MCHC: 33.5 g/dL (ref 30.0–36.0)
MCV: 91.5 fL (ref 80.0–100.0)
Monocytes Absolute: 0.4 K/uL (ref 0.1–1.0)
Monocytes Relative: 9 %
Neutro Abs: 3 K/uL (ref 1.7–7.7)
Neutrophils Relative %: 72 %
Platelets: 137 K/uL — ABNORMAL LOW (ref 150–400)
RBC: 5.44 MIL/uL (ref 4.22–5.81)
RDW: 12.4 % (ref 11.5–15.5)
WBC: 4.3 K/uL (ref 4.0–10.5)
nRBC: 0 % (ref 0.0–0.2)

## 2023-10-02 LAB — BASIC METABOLIC PANEL WITH GFR
Anion gap: 8 (ref 5–15)
BUN: 18 mg/dL (ref 8–23)
CO2: 23 mmol/L (ref 22–32)
Calcium: 9 mg/dL (ref 8.9–10.3)
Chloride: 107 mmol/L (ref 98–111)
Creatinine, Ser: 1.27 mg/dL — ABNORMAL HIGH (ref 0.61–1.24)
GFR, Estimated: 54 mL/min — ABNORMAL LOW (ref >60)
Glucose, Bld: 126 mg/dL — ABNORMAL HIGH (ref 70–99)
Potassium: 4.5 mmol/L (ref 3.5–5.1)
Sodium: 138 mmol/L (ref 135–145)

## 2023-10-02 LAB — URINALYSIS, ROUTINE W REFLEX MICROSCOPIC
Bilirubin Urine: NEGATIVE
Glucose, UA: 50 mg/dL — AB
Hgb urine dipstick: NEGATIVE
Ketones, ur: NEGATIVE mg/dL
Leukocytes,Ua: NEGATIVE
Nitrite: NEGATIVE
Protein, ur: NEGATIVE mg/dL
Specific Gravity, Urine: 1.014 (ref 1.005–1.030)
pH: 8 (ref 5.0–8.0)

## 2023-10-02 LAB — TROPONIN I (HIGH SENSITIVITY): Troponin I (High Sensitivity): 5 ng/L (ref <18)

## 2023-10-02 MED ORDER — AMLODIPINE BESYLATE 5 MG PO TABS: 5.0000 mg | ORAL_TABLET | Freq: Every day | ORAL | 0 refills | Status: DC

## 2023-10-02 MED ORDER — AMLODIPINE BESYLATE 5 MG PO TABS
5.0000 mg | ORAL_TABLET | Freq: Once | ORAL | Status: AC
  Administered 2023-10-02: 5 mg via ORAL
  Filled 2023-10-02: qty 1

## 2023-10-02 NOTE — ED Provider Notes (Signed)
  Physical Exam  BP (!) 164/85   Pulse 82   Temp 98 F (36.7 C) (Oral)   Resp 11   Ht 5' 6 (1.676 m)   Wt 68.9 kg   SpO2 98%   BMI 24.52 kg/m   Physical Exam  Procedures  Procedures  ED Course / MDM    Medical Decision Making Amount and/or Complexity of Data Reviewed Labs: ordered. Radiology: ordered.  Risk Prescription drug management.   Received in signout.  Hypertension.  Pending troponin.  Was a delay due to lab issues along with troponin resulting.  However will once resulted was negative.  Discharge home.       Patsey Lot, MD 10/02/23 1045

## 2023-10-02 NOTE — ED Triage Notes (Signed)
 Pt BIBEMS from home came to ED c/o elevated blood pressure. Denies any other symptoms

## 2023-10-02 NOTE — ED Provider Notes (Signed)
 [place] EMERGENCY DEPARTMENT AT [facility] Provider Note   CSN: [id number] Arrival date & time: 10/02/23  0419     History Chief Complaint  Patient presents with   Hypertension    HPI Matthew Aguirre is a 89 y.o. male presenting for chief complaint of back pain and high blood pressure. Woke up at 0200 for bathroom, instant left sided back pain. BP 200/100 Down to 190/70 on arrival Pain resolved Recent addition of Repathy  Patient's recorded medical, surgical, social, medication list and allergies were reviewed in the Snapshot window as part of the initial history.   Review of Systems   Review of Systems  Constitutional:  Negative for chills and fever.  HENT:  Negative for ear pain and sore throat.   Eyes:  Negative for pain and visual disturbance.  Respiratory:  Negative for cough and shortness of breath.   Cardiovascular:  Negative for chest pain and palpitations.  Gastrointestinal:  Negative for abdominal pain and vomiting.  Genitourinary:  Negative for dysuria and hematuria.  Musculoskeletal:  Negative for arthralgias and back pain.  Skin:  Negative for color change and rash.  Neurological:  Negative for seizures and syncope.  All other systems reviewed and are negative.   Physical Exam Updated Vital Signs BP (!) 158/89   Pulse 80   Temp 98.4 F (36.9 C) (Oral)   Resp 13   Ht 5' 6 (1.676 m)   Wt 68.9 kg   SpO2 100%   BMI 24.52 kg/m  Physical Exam Vitals and nursing note reviewed.  Constitutional:      General: He is not in acute distress.    Appearance: He is well-developed.  HENT:     Head: Normocephalic and atraumatic.  Eyes:     Conjunctiva/sclera: Conjunctivae normal.  Cardiovascular:     Rate and Rhythm: Normal rate and regular rhythm.     Heart sounds: No murmur heard. Pulmonary:     Effort: Pulmonary effort is normal. No respiratory distress.     Breath sounds: Normal breath sounds.  Abdominal:     Palpations: Abdomen is  soft.     Tenderness: There is no abdominal tenderness.  Musculoskeletal:        General: No swelling.     Cervical back: Neck supple.  Skin:    General: Skin is warm and dry.     Capillary Refill: Capillary refill takes less than 2 seconds.  Neurological:     Mental Status: He is alert.  Psychiatric:        Mood and Affect: Mood normal.      ED Course/ Medical Decision Making/ A&P    Procedures Procedures   Medications Ordered in ED Medications - No data to display  Medical Decision Making:   Matthew Aguirre is a 89 y.o. male who presented to the ED today with a chief complaint detailed above.    Patient placed on continuous vitals and telemetry monitoring while in ED which was reviewed periodically.  Complete initial physical exam performed, notably the patient  was HDS.    Reviewed and confirmed nursing documentation for past medical history, family history, social history.    Initial Assessment:   With the patient's presentation of elevated blood pressure readings, most likely diagnosis is hypertensive urgency. Other diagnoses associated with hypertensive emergency were considered including (but not limited to) intracranial hemorrhage, acute renal artery stenosis, acute kidney injury, myocardial stress, ophthalmologic emergencies. These are considered less likely due to history of  present illness and physical exam findings.   This is most consistent with an acute life/limb threatening illness complicated by underlying chronic conditions. Will evaluate for hypertensive emergency as below.  Initial Plan:  Screening labs including CBC and Metabolic panel to evaluate for infectious or metabolic etiology of disease.  Urinalysis with reflex culture ordered to evaluate for UTI or relevant urologic/nephrologic pathology.  CXR to evaluate for structural/infectious intrathoracic pathology.  EKG and single troponin to evaluate for cardiac pathology. Single troponin appropriate due  to greater than 6 hours since maximal intensity of symptoms. Objective evaluation as below reviewed. Considered further administration of antihypertensives in ED, per consensus guidelines for [facility] of emergency physicians, acute treatment of hypertensive urgency alone in the emergency department is not recommended.  If patient has evidence of hypertensive emergency on objective laboratory evaluation, will reevaluate.  Will monitor blood pressure while patient awaiting above laboratory studies.  Initial Study Results:   Laboratory  All laboratory results reviewed without evidence of clinically relevant pathology.    EKG EKG was reviewed independently. Rate, rhythm, axis, intervals all examined and without medically relevant abnormality. ST segments without concerns for elevations.    Radiology:  All images reviewed independently. Agree with radiology report at this time.   DG Chest Portable 1 View Result Date: 10/02/2023 CLINICAL DATA:  Chest/back pain EXAM: PORTABLE CHEST 1 VIEW COMPARISON:  08/02/2014 FINDINGS: Aortic atherosclerosis. Heart size and mediastinal contours are unremarkable scarring within the right lower lung. Chronic coarsened interstitial markings with diffuse bronchial wall thickening. No airspace consolidation, pleural effusion or pneumothorax. Osseous structures appear intact. IMPRESSION: 1. Chronic coarsened interstitial markings with diffuse bronchial wall thickening. 2. Right lower lung scarring. Electronically Signed   By: Waddell Calk M.D.   On: 10/02/2023 05:21     Reassessment and Plan:   Patient clinically in no acute distress. Blood pressure downtrended spontaneously.  He states his normal day-to-day blood pressures are relatively well-controlled.  Given multiple visits between his primary care office and tonight with systolics above 190, would recommend starting him on amlodipine  which are sent to his outpatient pharmacy for ongoing care and  management.  Disposition:  He is pending troponin results only at time of handoff to oncoming team. Anticipate troponin will be negative and patient will be able to safely follow-up in the outpatient setting with his PCP.  Emergency Department Medication Summary:   Medications - No data to display        Clinical Impression:  1. Secondary hypertension      Data Unavailable   Final Clinical Impression(s) / ED Diagnoses Final diagnoses:  Secondary hypertension    Rx / DC Orders ED Discharge Orders          Ordered    amLODipine  (NORVASC ) 5 MG tablet  Daily        10/02/23 0648              Jerral Meth, MD 10/02/23 [phone]

## 2023-10-07 ENCOUNTER — Telehealth: Payer: Self-pay

## 2023-10-07 NOTE — Progress Notes (Unsigned)
 Complex Care Management Note Care Guide Note  10/07/2023 Name: Matthew Aguirre MRN: [id number] DOB: [birth year]   Complex Care Management Outreach Attempts: An unsuccessful telephone outreach was attempted today to offer the patient information about available complex care management services.  Follow Up Plan:  Additional outreach attempts will be made to offer the patient complex care management information and services.   Encounter Outcome:  No Answer  [name]  [facility], [facility] Health Care Management Assistant Direct Dial: [phone]  Fax: [phone]

## 2023-10-07 NOTE — Progress Notes (Unsigned)
 Complex Care Management Note Care Guide Note  10/07/2023 Name: Matthew Aguirre MRN: [id number] DOB: 1934/01/10   Complex Care Management Outreach Attempts: A second unsuccessful outreach was attempted today to offer the patient with information about available complex care management services.  Follow Up Plan:  Additional outreach attempts will be made to offer the patient complex care management information and services.   Encounter Outcome:  Patient Request to Call Back  [name]  [facility], [facility] Health Care Management Assistant Direct Dial: [phone]  Fax: [phone]

## 2023-10-08 NOTE — Progress Notes (Signed)
 Complex Care Management Note  Care Guide Note 10/08/2023 Name: Matthew Aguirre MRN: [id number] DOB: [birth year]  Matthew Aguirre Matthew Aguirre is a 89 y.o. year old male who sees Sebastian Beverley NOVAK, MD for primary care. I reached out to [employer] by phone today to offer complex care management services.  [name] was given information about Complex Care Management services today including:   The Complex Care Management services include support from the care team which includes your Nurse Care Manager, Clinical Social Worker, or Pharmacist.  The Complex Care Management team is here to help remove barriers to the health concerns and goals most important to you. Complex Care Management services are voluntary, and the patient may decline or stop services at any time by request to their care team member.   Complex Care Management Consent Status: Patient agreed to services and verbal consent obtained.   Follow up plan:  Telephone appointment with complex care management team member scheduled for:  10/15/23 at 3:00 p.m.   Encounter Outcome:  Patient Scheduled  [name]  [facility], [facility] Health Care Management Assistant Direct Dial: [phone]  Fax: [phone]

## 2023-10-15 ENCOUNTER — Other Ambulatory Visit: Payer: Self-pay

## 2023-10-16 NOTE — Progress Notes (Signed)
 10/16/2023 Name: Matthew Aguirre MRN: [id number] DOB: 11-04-1933  Chief Complaint  Patient presents with   Hyperlipidemia   Matthew Aguirre is a 89 y.o. year old male who presented for a telephone visit.   They were referred to the pharmacist by their PCP for assistance in managing medication access.   Subjective:  Care Team: Primary Care Provider: Sebastian Beverley NOVAK, MD ; Next Scheduled Visit: 8/8  Medication Access/Adherence  Current Pharmacy:  [facility] [address] [place], [place] - [address] AT [address] [place] [address] Phone: [phone] Fax: [phone]  [facility] - [place], [place] - [address] AT Portal to [facility] [address] [place] [address] Phone: [phone] Fax: [phone]  -Patient reports affordability concerns with their medications: Yes  -Patient reports access/transportation concerns to their pharmacy: No  -Patient reports adherence concerns with their medications:  No    Hyperlipidemia/ASCVD Risk Reduction Current lipid lowering medications: Repatha  140mg  every other week, Omega 3 1200mg  BID Medications tried in the past: did not tolerate statins or ezetimibe well in the past (reaction unknown-patient does not recall) Antiplatelet regimen: ASA 81mg  daily -Repatha  recently prescribed by PCP, and copay for 1 month is $140 per patient.  He did pick the prescription up and has now had 2 doses of medication- endorses tolerating well.  Next dose will be due 8/12.  Objective:  Lab Results  Component Value Date   HGBA1C 6.0 09/21/2023   Lab Results  Component Value Date   CREATININE 1.27 (H) 10/02/2023   BUN 18 10/02/2023   NA 138 10/02/2023   K 4.5 10/02/2023   CL 107 10/02/2023   CO2 23 10/02/2023   Lab Results  Component Value Date   CHOL 160 09/21/2023   HDL 33.60 (L) 09/21/2023   LDLCALC 102  (H) 09/21/2023   TRIG 121.0 09/21/2023   CHOLHDL 5 09/21/2023   Medications Reviewed Today     Reviewed by Deanna Channing LABOR, RPH (Pharmacist) on 10/15/23 at 1518  Med List Status: <None>   Medication Order Taking? Sig Documenting Provider Last Dose Status Informant  acetaminophen (TYLENOL) 500 MG tablet [id number]  Take 500 mg by mouth as needed (prn).  [provider]  Active Self, Pharmacy Records  amLODipine  (NORVASC ) 5 MG tablet [id number]  Take 1 tablet (5 mg total) by mouth daily. Jerral Meth, MD  Active   aspirin 81 MG EC tablet [id number]  Take 81 mg by mouth daily. [provider]  Active Self, Pharmacy Records  Cholecalciferol (VITAMIN D  PO) [id number]  Take 2,000 Int'l Units by mouth 2 (two) times daily.  [provider]  Active Self, Pharmacy Records  Cyanocobalamin  (VITAMIN B12 PO) [id number]  Take 500 mcg by mouth every other day. [provider]  Active Self, Pharmacy Records  Evolocumab  (REPATHA  SURECLICK) 140 MG/ML SOAJ [id number] Yes Inject 140 mg into the skin every 14 (fourteen) days. Sebastian Beverley NOVAK, MD  Active Self, Pharmacy Records  famotidine  (PEPCID ) 20 MG tablet [id number]  Take 1 tablet 2 x / day to Prevent Heartburn & Indigestion .  Patient taking differently: Take 20 mg by mouth daily.   Douglass Kenney NOVAK, FNP  Active Self, Pharmacy Records  Flaxseed, Linseed, (FLAX SEED OIL PO) [id number]  Take 1,200 mg by mouth 3 (three) times daily.  [provider]  Active Self, Pharmacy Records  Magnesium 250 MG  TABS [id number]  Take 250 mg by mouth daily. [provider]  Active Self, Pharmacy Records  metoprolol  tartrate (LOPRESSOR ) 50 MG tablet [id number]  Take 1 tablet (50 mg total) by mouth 2 (two) times daily. Sebastian Beverley NOVAK, MD  Active Self, Pharmacy Records  Omega-3 Fatty Acids (FISH OIL PO) [id number]  Take 1,200 mg by mouth 2 (two) times daily.  [provider]  Active Self, Pharmacy Records  pantoprazole   (PROTONIX ) 20 MG tablet [id number]  Take  1 tablet  Daily  to Prevent Heartburn & Indigestion                                                       /                                                        TAKE                                        BY                                             MOUTH Sebastian Beverley NOVAK, MD  Active Self, Pharmacy Records           Assessment/Plan:   Hyperlipidemia/ASCVD Risk Reduction: -Recommend to continue Repatha  140mg  every other week and Fish Oil 1200mg  BID -Meets financial criteria for [employer] hypercholesteremia fund.  I have enrolled patient and saved informed under the FYI tab.  I will contact his pharmacy Wednesday 8/6 to provide billing information, because refill should go through then based on the last fill date.  Channing DELENA Mealing, PharmD, DPLA

## 2023-10-21 ENCOUNTER — Telehealth: Payer: Self-pay

## 2023-10-21 NOTE — Progress Notes (Signed)
   10/21/2023  Patient ID: Matthew Aguirre, male   DOB: [birth year], 89 y.o.   MRN: [id number]  Contacted [facility] to provide processing information for [facility] to assist with copay for patient's Repatha .  Refill is going through for $0 now.  Sending patient a MyChart message to make him aware.  Channing DELENA Mealing, PharmD, DPLA

## 2023-10-22 ENCOUNTER — Encounter: Payer: Self-pay | Admitting: Family Medicine

## 2023-10-22 ENCOUNTER — Ambulatory Visit (INDEPENDENT_AMBULATORY_CARE_PROVIDER_SITE_OTHER): Admitting: Family Medicine

## 2023-10-22 VITALS — BP 158/84 | HR 70 | Temp 98.0°F | Resp 18 | Wt 152.8 lb

## 2023-10-22 DIAGNOSIS — I10 Essential (primary) hypertension: Secondary | ICD-10-CM

## 2023-10-22 DIAGNOSIS — E782 Mixed hyperlipidemia: Secondary | ICD-10-CM | POA: Diagnosis not present

## 2023-10-22 MED ORDER — AMLODIPINE BESYLATE 5 MG PO TABS: 5.0000 mg | ORAL_TABLET | Freq: Every day | ORAL | 3 refills | Status: DC

## 2023-10-22 NOTE — Progress Notes (Signed)
 Assessment & Plan   Assessment/Plan:   Assessment and Plan Hypertension Hypertension with fluctuating blood pressure readings. Initially elevated in early July, normalized with home readings between 109/68 to 127/73. Experienced a spike to 200/111, leading to an emergency department visit and initiation of amlodipine . Current home readings range from 102/66 to 134/76. Office readings slightly elevated, likely due to white coat hypertension. Current medications include amlodipine  5 mg daily and metoprolol  50 mg twice daily. Blood pressure appears controlled with current regimen. - Refill amlodipine  prescription. - Continue metoprolol  50 mg twice daily. - Monitor blood pressure at home daily, preferably an hour after medication intake. - Follow up in one month to reassess blood pressure control.  Hyperlipidemia Hyperlipidemia managed with Repatha . Concerns about elevated blood sugar in the emergency department, likely stress-related. No side effects reported from Repatha . LDL levels slightly above normal, but Repatha  is expected to help lower LDL levels. Repatha  dosing is every 14 days, with flexibility in administration timing. - Continue Repatha  every 14 days. - Monitor blood sugar levels regularly. - Encourage dietary modifications to reduce carbohydrate intake. - Reassess lipid levels at future appointments.   There are no discontinued medications.  Return in about 1 month (around 11/22/2023) for fasting labs, BP, hyperglycemia.        Subjective:   Encounter date: 10/22/2023  Matthew Aguirre is a 89 y.o. male who has Essential hypertension; Hyperlipidemia, mixed; Vitamin D  deficiency; Medication management; Malignant neoplasm of prostate (HCC); Pulmonary sarcoidosis (1988) ; Other abnormal glucose; BMI 25.0-25.9,adult; Plantar fasciitis of left foot; Gastroesophageal reflux disease without esophagitis; Aortic atherosclerosis (HCC) by CT scan 2021; Left foot drop; and  Radiation-induced lumbosacral plexopathy on their problem list..   He  has a past medical history of Essential hypertension (02/15/2007), Hyperlipidemia (07/13/2013), Prediabetes (07/13/2013), Prostate cancer [facility]), Pulmonary nodule (2008 resolved on f/u)  (02/15/2007), and Vitamin D  deficiency (07/13/2013).SABRA   He presents with chief complaint of Hypertension (1 month follow up. Pt blood pressure reading at home range from 158/84. Pt went to the ED on 10/02/2023 and was prescribed amlodipine  5MG . //HM- updated. ) .   History of Present Illness The patient presents for follow-up of blood pressure management.  His blood pressure was significantly elevated at the beginning of July but normalized to a range of 109/68 to 127/73 mmHg after the initial visit. However, he experienced another spike in blood pressure, reaching 200/111 mmHg, which led to a visit to the emergency department where he was prescribed amlodipine . Since then, his blood pressure at home has been ranging between 102/60 to 136/76 mmHg.  He is currently taking amlodipine  5 mg once daily and metoprolol  50 mg twice daily. He recalls waking up feeling strange on the 19th of the previous month, with a blood pressure reading of 200/111 mmHg, prompting him to call 911 and visit the emergency department. His home blood pressure readings have been more stable since starting amlodipine .  He has a history of being on metoprolol  for several years and has recently added amlodipine . He monitors his blood pressure daily at home, typically around 8 AM, and reports feeling well with no chest pain or significant stress affecting his daily activities.  He is on Repatha  for cholesterol management, which he administers every two weeks. He inquires about the potential impact of Repatha  on his blood sugar levels, noting a slight elevation during his emergency department visit, but attributes it to stress. He has not experienced any side effects from Repatha  and is  able to  self-administer the injections.  [place] nearest [place], and has a medical alert device for emergencies. No chest pain and reports feeling well overall, with no significant stress impacting his daily activities.   ROS  Past Surgical History:  Procedure Laterality Date   PROSTATE BIOPSY  2020   PROSTATE BIOPSY  2014   TONSILECTOMY/ADENOIDECTOMY WITH MYRINGOTOMY     TREATMENT FISTULA ANAL      Outpatient Medications Prior to Visit  Medication Sig Dispense Refill   acetaminophen (TYLENOL) 500 MG tablet Take 500 mg by mouth as needed (prn).      amLODipine  (NORVASC ) 5 MG tablet Take 1 tablet (5 mg total) by mouth daily. 30 tablet 0   aspirin 81 MG EC tablet Take 81 mg by mouth daily.     Cholecalciferol (VITAMIN D  PO) Take 2,000 Int'l Units by mouth 2 (two) times daily.      Cyanocobalamin  (VITAMIN B12 PO) Take 500 mcg by mouth every other day.     Evolocumab  (REPATHA  SURECLICK) 140 MG/ML SOAJ Inject 140 mg into the skin every 14 (fourteen) days. 6 mL 0   famotidine  (PEPCID ) 20 MG tablet Take 1 tablet 2 x / day to Prevent Heartburn & Indigestion . (Patient taking differently: Take 20 mg by mouth daily.) 180 tablet 0   Flaxseed, Linseed, (FLAX SEED OIL PO) Take 1,200 mg by mouth 3 (three) times daily.      Magnesium 250 MG TABS Take 250 mg by mouth daily.     metoprolol  tartrate (LOPRESSOR ) 50 MG tablet Take 1 tablet (50 mg total) by mouth 2 (two) times daily. 180 tablet 3   Omega-3 Fatty Acids (FISH OIL PO) Take 1,200 mg by mouth 2 (two) times daily.      pantoprazole  (PROTONIX ) 20 MG tablet Take  1 tablet  Daily  to Prevent Heartburn & Indigestion                                                       /                                                        TAKE                                        BY                                             MOUTH 90 tablet 3   No facility-administered medications prior to visit.    Family  History  Problem Relation Age of Onset   Hyperlipidemia Brother    Hypertension Brother    Breast cancer Neg Hx    Colon cancer Neg Hx    Prostate cancer Neg Hx    Pancreatic cancer Neg Hx     Social History   Socioeconomic History   Marital status: Widowed  Spouse name: Not on file   Number of children: Not on file   Years of education: Not on file   Highest education level: Not on file  Occupational History    Comment: retired  Tobacco Use   Smoking status: Never   Smokeless tobacco: Never  Vaping Use   Vaping status: Never Used  Substance and Sexual Activity   Alcohol use: No   Drug use: Never   Sexual activity: Not Currently  Other Topics Concern   Not on file  Social History Narrative   Not on file   Social Drivers of Health   Financial Resource Strain: Not on file  Food Insecurity: Not on file  Transportation Needs: Not on file  Physical Activity: Not on file  Stress: Not on file  Social Connections: Not on file  Intimate Partner Violence: Not on file                                                                                                  Objective:  Physical Exam: BP (!) 158/84 (BP Location: Right Arm, Patient Position: Sitting, Cuff Size: Normal) Comment: patient machine in clinic reading  Pulse 70   Temp 98 F (36.7 C) (Temporal)   Resp 18   Wt 152 lb 12.8 oz (69.3 kg)   SpO2 99%   BMI 24.66 kg/m    Physical Exam VITALS: BP- 133/83 MEASUREMENTS: BMI- 27.9. GENERAL: Alert, cooperative, well developed, no acute distress HEENT: Normocephalic, normal oropharynx, moist mucous membranes CHEST: Clear to auscultation bilaterally, no wheezes, rhonchi, or crackles CARDIOVASCULAR: Normal heart rate and rhythm, S1 and S2 normal without murmurs ABDOMEN: Soft, non-tender, non-distended, without organomegaly, normal bowel sounds EXTREMITIES: No cyanosis or edema NEUROLOGICAL: Cranial nerves grossly intact, moves all extremities without gross  motor or sensory deficit   Physical Exam  DG Chest Portable 1 View Result Date: 10/02/2023 CLINICAL DATA:  Chest/back pain EXAM: PORTABLE CHEST 1 VIEW COMPARISON:  08/02/2014 FINDINGS: Aortic atherosclerosis. Heart size and mediastinal contours are unremarkable scarring within the right lower lung. Chronic coarsened interstitial markings with diffuse bronchial wall thickening. No airspace consolidation, pleural effusion or pneumothorax. Osseous structures appear intact. IMPRESSION: 1. Chronic coarsened interstitial markings with diffuse bronchial wall thickening. 2. Right lower lung scarring. Electronically Signed   By: Waddell Calk M.D.   On: 10/02/2023 05:21    Recent Results (from the past 2160 hours)  Magnesium     Status: None   Collection Time: 09/21/23 10:10 AM  Result Value Ref Range   Magnesium 2.2 1.5 - 2.5 mg/dL  Lipid panel     Status: Abnormal   Collection Time: 09/21/23 10:10 AM  Result Value Ref Range   Cholesterol 160 0 - 200 mg/dL    Comment: ATP III Classification       Desirable:  < 200 mg/dL               Borderline High:  200 - 239 mg/dL          High:  > = 759 mg/dL   Triglycerides 878.9 0.0 - 149.0 mg/dL  Comment: Normal:  <150 mg/dLBorderline High:  150 - 199 mg/dL   HDL 66.39 (L) >60.99 mg/dL   VLDL 75.7 0.0 - 59.9 mg/dL   LDL Cholesterol 897 (H) 0 - 99 mg/dL   Total CHOL/HDL Ratio 5     Comment:                Men          Women1/2 Average Risk     3.4          3.3Average Risk          5.0          4.42X Average Risk          9.6          7.13X Average Risk          15.0          11.0                       NonHDL 126.19     Comment: NOTE:  Non-HDL goal should be 30 mg/dL higher than patient's LDL goal (i.e. LDL goal of < 70 mg/dL, would have non-HDL goal of < 100 mg/dL)  COMPLETE METABOLIC PANEL WITHOUT GFR     Status: Abnormal   Collection Time: 09/21/23 10:10 AM  Result Value Ref Range   Glucose, Bld 97 65 - 99 mg/dL    Comment: .            Fasting  reference interval .    BUN 17 7 - 25 mg/dL   Creat 8.61 (H) 9.29 - 1.22 mg/dL   BUN/Creatinine Ratio 12 6 - 22 (calc)   Sodium 138 135 - 146 mmol/L   Potassium 5.2 3.5 - 5.3 mmol/L   Chloride 103 98 - 110 mmol/L   CO2 22 20 - 32 mmol/L   Calcium 9.5 8.6 - 10.3 mg/dL   Total Protein 6.9 6.1 - 8.1 g/dL   Albumin 4.4 3.6 - 5.1 g/dL   Globulin 2.5 1.9 - 3.7 g/dL (calc)   AG Ratio 1.8 1.0 - 2.5 (calc)   Total Bilirubin 1.2 0.2 - 1.2 mg/dL   Alkaline phosphatase (APISO) 62 35 - 144 U/L   AST 22 10 - 35 U/L   ALT 26 9 - 46 U/L  CBC with Differential/Platelet     Status: Abnormal   Collection Time: 09/21/23 10:10 AM  Result Value Ref Range   WBC 4.6 4.0 - 10.5 K/uL   RBC 5.66 4.22 - 5.81 Mil/uL   Hemoglobin 17.0 13.0 - 17.0 g/dL   HCT 48.7 60.9 - 47.9 %   MCV 90.6 78.0 - 100.0 fl   MCHC 33.3 30.0 - 36.0 g/dL   RDW 86.7 88.4 - 84.4 %   Platelets 146.0 (L) 150.0 - 400.0 K/uL   Neutrophils Relative % 72.3 43.0 - 77.0 %   Lymphocytes Relative 15.9 12.0 - 46.0 %   Monocytes Relative 10.2 3.0 - 12.0 %   Eosinophils Relative 1.0 0.0 - 5.0 %   Basophils Relative 0.6 0.0 - 3.0 %   Neutro Abs 3.3 1.4 - 7.7 K/uL   Lymphs Abs 0.7 0.7 - 4.0 K/uL   Monocytes Absolute 0.5 0.1 - 1.0 K/uL   Eosinophils Absolute 0.0 0.0 - 0.7 K/uL   Basophils Absolute 0.0 0.0 - 0.1 K/uL  VITAMIN D  25 Hydroxy (Vit-D Deficiency, Fractures)     Status: None   Collection Time: 09/21/23  10:10 AM  Result Value Ref Range   VITD 84.58 30.00 - 100.00 ng/mL  Insulin , random     Status: None   Collection Time: 09/21/23 10:10 AM  Result Value Ref Range   Insulin  11.2 uIU/mL    Comment:       Reference Range  < or = 18.4 .       Risk:       Optimal          < or = 18.4       Moderate         NA       High             >18.4 .       Adult cardiovascular event risk category       cut points (optimal, moderate, high)       are based on Insulin  Reference Interval       studies performed at [facility]       in  2022. SABRA   Hemoglobin A1c     Status: None   Collection Time: 09/21/23 10:10 AM  Result Value Ref Range   Hgb A1c MFr Bld 6.0 4.6 - 6.5 %    Comment: Glycemic Control Guidelines for People with Diabetes:Non Diabetic:  <6%Goal of Therapy: <7%Additional Action Suggested:  >8%   TSH     Status: None   Collection Time: 09/21/23 10:10 AM  Result Value Ref Range   TSH 1.03 0.35 - 5.50 uIU/mL  PSA     Status: None   Collection Time: 09/21/23 10:10 AM  Result Value Ref Range   PSA 0.35 0.10 - 4.00 ng/mL    Comment: Test performed using Access Hybritech PSA Assay, a parmagnetic partical, chemiluminecent immunoassay.  Urinalysis w microscopic + reflex cultur     Status: Abnormal   Collection Time: 09/21/23 10:10 AM   Specimen: Blood  Result Value Ref Range   Color, Urine YELLOW YELLOW   APPearance CLEAR CLEAR   Specific Gravity, Urine 1.014 1.001 - 1.035   pH > OR = 8.5 (A) 5.0 - 8.0   Glucose, UA NEGATIVE NEGATIVE   Bilirubin Urine NEGATIVE NEGATIVE   Ketones, ur NEGATIVE NEGATIVE   Hgb urine dipstick NEGATIVE NEGATIVE   Protein, ur NEGATIVE NEGATIVE   Nitrites, Initial NEGATIVE NEGATIVE   Leukocyte Esterase NEGATIVE NEGATIVE   WBC, UA NONE SEEN 0 - 5 /HPF   RBC / HPF NONE SEEN 0 - 2 /HPF   Squamous Epithelial / HPF NONE SEEN < OR = 5 /HPF   Bacteria, UA NONE SEEN NONE SEEN /HPF   Hyaline Cast NONE SEEN NONE SEEN /LPF  B12 and Folate Panel     Status: Abnormal   Collection Time: 09/21/23 10:10 AM  Result Value Ref Range   Vitamin B-12 1,095 (H) 211 - 911 pg/mL   Folate 20.9 >5.9 ng/mL  Iron, TIBC and Ferritin Panel     Status: None   Collection Time: 09/21/23 10:10 AM  Result Value Ref Range   Iron 156 50 - 180 mcg/dL   TIBC 633 749 - 574 mcg/dL (calc)   %SAT 43 20 - 48 % (calc)   Ferritin 48 24 - 380 ng/mL  Angiotensin converting enzyme     Status: None   Collection Time: 09/21/23 10:10 AM  Result Value Ref Range   Angiotensin-Converting Enzyme 29 9 - 67 U/L  Sedimentation  rate     Status: None  Collection Time: 09/21/23 10:10 AM  Result Value Ref Range   Sed Rate 2 0 - 20 mm/hr  CRP High sensitivity     Status: None   Collection Time: 09/21/23 10:10 AM  Result Value Ref Range   CRP, High Sensitivity 1.670 0.000 - 5.000 mg/L    Comment: Note:  An elevated hs-CRP (>5 mg/L) should be repeated after 2 weeks to rule out recent infection or trauma.  REFLEXIVE URINE CULTURE     Status: None   Collection Time: 09/21/23 10:10 AM  Result Value Ref Range   Reflexve Urine Culture      Comment: NO CULTURE INDICATED  CBC with Differential     Status: Abnormal   Collection Time: 10/02/23  4:47 AM  Result Value Ref Range   WBC 4.3 4.0 - 10.5 K/uL   RBC 5.44 4.22 - 5.81 MIL/uL   Hemoglobin 16.7 13.0 - 17.0 g/dL   HCT 50.1 60.9 - 47.9 %   MCV 91.5 80.0 - 100.0 fL   MCH 30.7 26.0 - 34.0 pg   MCHC 33.5 30.0 - 36.0 g/dL   RDW 87.5 88.4 - 84.4 %   Platelets 137 (L) 150 - 400 K/uL    Comment: REPEATED TO VERIFY   nRBC 0.0 0.0 - 0.2 %   Neutrophils Relative % 72 %   Neutro Abs 3.0 1.7 - 7.7 K/uL   Lymphocytes Relative 16 %   Lymphs Abs 0.7 0.7 - 4.0 K/uL   Monocytes Relative 9 %   Monocytes Absolute 0.4 0.1 - 1.0 K/uL   Eosinophils Relative 1 %   Eosinophils Absolute 0.0 0.0 - 0.5 K/uL   Basophils Relative 1 %   Basophils Absolute 0.0 0.0 - 0.1 K/uL   Immature Granulocytes 1 %   Abs Immature Granulocytes 0.06 0.00 - 0.07 K/uL    Comment: Performed at [facility], [address], [place], [place] [address]  Basic metabolic panel     Status: Abnormal   Collection Time: 10/02/23  4:47 AM  Result Value Ref Range   Sodium 138 135 - 145 mmol/L   Potassium 4.5 3.5 - 5.1 mmol/L   Chloride 107 98 - 111 mmol/L   CO2 23 22 - 32 mmol/L   Glucose, Bld 126 (H) 70 - 99 mg/dL    Comment: Glucose reference range applies only to samples taken after fasting for at least 8 hours.   BUN 18 8 - 23 mg/dL   Creatinine, Ser 8.72 (H) 0.61 - 1.24 mg/dL   Calcium 9.0 8.9 -  89.6 mg/dL   GFR, Estimated 54 (L) >60 mL/min    Comment: (NOTE) Calculated using the CKD-EPI Creatinine Equation (2021)    Anion gap 8 5 - 15    Comment: Performed at [facility], [address], [place], [place] [address]  Troponin I (High Sensitivity)     Status: None   Collection Time: 10/02/23  4:57 AM  Result Value Ref Range   Troponin I (High Sensitivity) 5 <18 ng/L    Comment: (NOTE) Elevated high sensitivity troponin I (hsTnI) values and significant  changes across serial measurements may suggest ACS but many other  chronic and acute conditions are known to elevate hsTnI results.  Refer to the Links section for chest pain algorithms and additional  guidance. Performed at [facility], [address], [place], [place] [address]   Urinalysis, Routine w reflex microscopic -Urine, Clean Catch     Status: Abnormal  Collection Time: 10/02/23  6:12 AM  Result Value Ref Range   Color, Urine YELLOW YELLOW   APPearance CLEAR CLEAR   Specific Gravity, Urine 1.014 1.005 - 1.030   pH 8.0 5.0 - 8.0   Glucose, UA 50 (A) NEGATIVE mg/dL   Hgb urine dipstick NEGATIVE NEGATIVE   Bilirubin Urine NEGATIVE NEGATIVE   Ketones, ur NEGATIVE NEGATIVE mg/dL   Protein, ur NEGATIVE NEGATIVE mg/dL   Nitrite NEGATIVE NEGATIVE   Leukocytes,Ua NEGATIVE NEGATIVE    Comment: Performed at [facility], [address], [place], [place] [address]        Beverley Adine Hummer, MD, MS

## 2023-11-23 ENCOUNTER — Ambulatory Visit (INDEPENDENT_AMBULATORY_CARE_PROVIDER_SITE_OTHER): Admitting: Family Medicine

## 2023-11-23 ENCOUNTER — Encounter: Payer: Self-pay | Admitting: Family Medicine

## 2023-11-23 VITALS — BP 140/78 | HR 70 | Temp 97.2°F | Resp 18 | Wt 153.4 lb

## 2023-11-23 DIAGNOSIS — K219 Gastro-esophageal reflux disease without esophagitis: Secondary | ICD-10-CM | POA: Diagnosis not present

## 2023-11-23 DIAGNOSIS — E538 Deficiency of other specified B group vitamins: Secondary | ICD-10-CM

## 2023-11-23 DIAGNOSIS — Z79899 Other long term (current) drug therapy: Secondary | ICD-10-CM | POA: Diagnosis not present

## 2023-11-23 DIAGNOSIS — T466X5A Adverse effect of antihyperlipidemic and antiarteriosclerotic drugs, initial encounter: Secondary | ICD-10-CM | POA: Diagnosis not present

## 2023-11-23 DIAGNOSIS — E782 Mixed hyperlipidemia: Secondary | ICD-10-CM | POA: Diagnosis not present

## 2023-11-23 DIAGNOSIS — Z23 Encounter for immunization: Secondary | ICD-10-CM

## 2023-11-23 DIAGNOSIS — G720 Drug-induced myopathy: Secondary | ICD-10-CM

## 2023-11-23 DIAGNOSIS — M21372 Foot drop, left foot: Secondary | ICD-10-CM

## 2023-11-23 DIAGNOSIS — I10 Essential (primary) hypertension: Secondary | ICD-10-CM

## 2023-11-23 DIAGNOSIS — N1831 Chronic kidney disease, stage 3a: Secondary | ICD-10-CM | POA: Diagnosis not present

## 2023-11-23 DIAGNOSIS — I700 Atherosclerosis of aorta: Secondary | ICD-10-CM

## 2023-11-23 DIAGNOSIS — S80811A Abrasion, right lower leg, initial encounter: Secondary | ICD-10-CM | POA: Diagnosis not present

## 2023-11-23 MED ORDER — REPATHA SURECLICK 140 MG/ML ~~LOC~~ SOAJ: 140.0000 mg | SUBCUTANEOUS | 3 refills | Status: AC

## 2023-11-23 MED ORDER — LOSARTAN POTASSIUM-HCTZ 50-12.5 MG PO TABS: 1.0000 | ORAL_TABLET | Freq: Every day | ORAL | 0 refills | Status: DC

## 2023-11-23 MED ORDER — FAMOTIDINE 20 MG PO TABS: 20.0000 mg | ORAL_TABLET | Freq: Two times a day (BID) | ORAL | 3 refills | Status: DC

## 2023-11-23 NOTE — Progress Notes (Signed)
 Assessment & Plan   Assessment/Plan:   Assessment & Plan Essential hypertension with medication-induced lower extremity edema and associated symptoms (malaise, insomnia, restless legs) Blood pressure readings at home are generally well-controlled, but office readings are elevated. Amlodipine  is suspected to cause lower extremity edema, malaise, insomnia, and restless legs. - Discontinue amlodipine . - Initiate losartan  50 mg with hydrochlorothiazide  12.5 mg daily to address symptoms and provide long-term kidney protection. - Monitor blood pressure and symptoms. - Check kidney function one week after starting losartan . - Continue metoprolol  tartrate 50 mg BID.  Mixed hyperlipidemia on Repatha  therapy Currently on Repatha  and fish oil for hyperlipidemia. Repatha  is covered by a grant, making it cost-effective. Repatha  is being used as an alternative after other therapies were not effective. - Continue Repatha  140 mg injection every 14 days. - Continue fish oil 1200 mg BID and flaxseed oil 1200 mg TID. - Recheck fasting cholesterol at next lab work.  Chronic kidney disease, stage 3a CKD stage 3a with recent normal vitamin D  and calcium levels. Losartan  may provide long-term kidney protection but requires monitoring for potential adverse effects. - Check phosphorus and PTH levels. - Monitor kidney function one week after starting losartan .  Right lower leg abrasion Abrasion on the right lower leg with no signs of infection or ulceration. Likely due to friction or minor trauma, possibly exacerbated by edema from amlodipine . - Discontinue amlodipine  to reduce edema. - Continue topical Neosporin application. - Elevate legs and consider compression stockings.  Gastroesophageal reflux disease (GERD) GERD managed with famotidine . - Refill famotidine  20 mg.  Vitamin B12 elevation and history of B group vitamin deficiency Slightly elevated vitamin B12 levels. Currently taking cyanocobalamin   500 mcg every other day. - Recheck vitamin B12 levels at next lab work.  Foot drop, right foot Chronic right foot drop.  General Health Maintenance Due for a flu shot. - Administer flu shot.  Follow-Up Plan for follow-up to monitor blood pressure and kidney function after medication changes. - Return in one week for fasting lab work. - Follow-up appointment in one month to recheck blood pressure.    Medications Discontinued During This Encounter  Medication Reason   amLODipine  (NORVASC ) 5 MG tablet    famotidine  (PEPCID ) 20 MG tablet Reorder   Evolocumab  (REPATHA  SURECLICK) 140 MG/ML SOAJ Reorder    Return for Follow up 1 week for labs (fasting), follow in 1 month for OV for BP.        Subjective:   Encounter date: 11/23/2023  Matthew Aguirre is a 89 y.o. male who has Essential hypertension; Hyperlipidemia, mixed; Vitamin D  deficiency; Medication management; Malignant neoplasm of prostate (HCC); Pulmonary sarcoidosis (1988) ; Other abnormal glucose; BMI 25.0-25.9,adult; Plantar fasciitis of left foot; Gastroesophageal reflux disease without esophagitis; Aortic atherosclerosis (HCC) by CT scan 2021; Left foot drop; Radiation-induced lumbosacral plexopathy; Abrasion of right leg; Adverse reaction to ezetimibe; CKD stage 3a, GFR 45-59 ml/min (HCC); and B12 deficiency on their problem list..   He  has a past medical history of Essential hypertension (02/15/2007), Hyperlipidemia (07/13/2013), Prediabetes (07/13/2013), Prostate cancer [facility]), Pulmonary nodule (2008 resolved on f/u)  (02/15/2007), and Vitamin D  deficiency (07/13/2013).SABRA   He presents with chief complaint of Hypertension (1 month follow up. Pt is not fasting today//HM due- flu vaccine ), Rash (Pt c/o of rash on right lower leg; used OTC antibiotic ointment for 1 week; no pain to itchiness is present  ), and Sleeping Problem (Pt c/o of decrease in sleep due to tossing and turning all  night; pt is getting between 8-9 hours  per nighht ) .   History of Present Illness The patient presents for follow-up of blood pressure management.  His blood pressure was significantly elevated at the beginning of July but normalized to a range of 109/68 to 127/73 mmHg after the initial visit. However, he experienced another spike in blood pressure, reaching 200/111 mmHg, which led to a visit to the emergency department where he was prescribed amlodipine . Since then, his blood pressure at home has been ranging between 102/60 to 136/76 mmHg.  He is currently taking amlodipine  5 mg once daily and metoprolol  50 mg twice daily. He recalls waking up feeling strange on the 19th of the previous month, with a blood pressure reading of 200/111 mmHg, prompting him to call 911 and visit the emergency department. His home blood pressure readings have been more stable since starting amlodipine .  He has a history of being on metoprolol  for several years and has recently added amlodipine . He monitors his blood pressure daily at home, typically around 8 AM, and reports feeling well with no chest pain or significant stress affecting his daily activities.  He is on Repatha  for cholesterol management, which he administers every two weeks. He inquires about the potential impact of Repatha  on his blood sugar levels, noting a slight elevation during his emergency department visit, but attributes it to stress. He has not experienced any side effects from Repatha  and is able to self-administer the injections.  [place] nearest neighbor, and has a medical alert device for emergencies. No chest pain and reports feeling well overall, with no significant stress impacting his daily activities.   ROS  Past Surgical History:  Procedure Laterality Date   PROSTATE BIOPSY  2020   PROSTATE BIOPSY  2014   TONSILECTOMY/ADENOIDECTOMY WITH MYRINGOTOMY     TREATMENT FISTULA ANAL      Outpatient Medications Prior to Visit   Medication Sig Dispense Refill   acetaminophen (TYLENOL) 500 MG tablet Take 500 mg by mouth as needed (prn).      aspirin 81 MG EC tablet Take 81 mg by mouth daily.     Cholecalciferol (VITAMIN D  PO) Take 2,000 Int'l Units by mouth 2 (two) times daily.      Cyanocobalamin  (VITAMIN B12 PO) Take 500 mcg by mouth every other day.     Flaxseed, Linseed, (FLAX SEED OIL PO) Take 1,200 mg by mouth 3 (three) times daily.      Magnesium 250 MG TABS Take 250 mg by mouth daily.     metoprolol  tartrate (LOPRESSOR ) 50 MG tablet Take 1 tablet (50 mg total) by mouth 2 (two) times daily. 180 tablet 3   Omega-3 Fatty Acids (FISH OIL PO) Take 1,200 mg by mouth 2 (two) times daily.      pantoprazole  (PROTONIX ) 20 MG tablet Take  1 tablet  Daily  to Prevent Heartburn & Indigestion                                                       /  TAKE                                        BY                                             MOUTH 90 tablet 3   amLODipine  (NORVASC ) 5 MG tablet Take 1 tablet (5 mg total) by mouth daily. 90 tablet 3   Evolocumab  (REPATHA  SURECLICK) 140 MG/ML SOAJ Inject 140 mg into the skin every 14 (fourteen) days. 6 mL 0   famotidine  (PEPCID ) 20 MG tablet Take 1 tablet 2 x / day to Prevent Heartburn & Indigestion . (Patient taking differently: Take 20 mg by mouth daily.) 180 tablet 0   No facility-administered medications prior to visit.    Family History  Problem Relation Age of Onset   Hyperlipidemia Brother    Hypertension Brother    Breast cancer Neg Hx    Colon cancer Neg Hx    Prostate cancer Neg Hx    Pancreatic cancer Neg Hx     Social History   Socioeconomic History   Marital status: Widowed    Spouse name: Not on file   Number of children: Not on file   Years of education: Not on file   Highest education level: Not on file  Occupational History    Comment: retired  Tobacco Use   Smoking status: Never   Smokeless  tobacco: Never  Vaping Use   Vaping status: Never Used  Substance and Sexual Activity   Alcohol use: No   Drug use: Never   Sexual activity: Not Currently  Other Topics Concern   Not on file  Social History Narrative   Not on file   Social Drivers of Health   Financial Resource Strain: Not on file  Food Insecurity: Not on file  Transportation Needs: Not on file  Physical Activity: Not on file  Stress: Not on file  Social Connections: Not on file  Intimate Partner Violence: Not on file                                                                                                  Objective:  Physical Exam: BP (!) 140/78 (BP Location: Right Arm, Patient Position: Sitting, Cuff Size: Normal) Comment: recheck  Pulse 70   Temp (!) 97.2 F (36.2 C) (Temporal)   Resp 18   Wt 153 lb 6.4 oz (69.6 kg)   SpO2 99%   BMI 24.76 kg/m    BP Readings from Last 3 Encounters:  11/23/23 (!) 140/78  10/22/23 (!) 158/84  10/02/23 (!) 164/85    Physical Exam GENERAL: Alert, cooperative, well developed, no acute distress. HEENT: Normocephalic, normal oropharynx, moist mucous membranes. CHEST: Clear to auscultation bilaterally, no wheezes, rhonchi, or crackles. CARDIOVASCULAR: Normal heart rate and rhythm, S1 and S2 normal without murmurs. ABDOMEN: Soft, non-tender,  non-distended, without organomegaly, normal bowel sounds. EXTREMITIES: No cyanosis or edema. NEUROLOGICAL: Cranial nerves grossly intact, left foot drop,  SKIN: Abrasion on right leg, overall skin condition good.   Physical Exam  DG Chest Portable 1 View Result Date: 10/02/2023 CLINICAL DATA:  Chest/back pain EXAM: PORTABLE CHEST 1 VIEW COMPARISON:  08/02/2014 FINDINGS: Aortic atherosclerosis. Heart size and mediastinal contours are unremarkable scarring within the right lower lung. Chronic coarsened interstitial markings with diffuse bronchial wall thickening. No airspace consolidation, pleural effusion or pneumothorax.  Osseous structures appear intact. IMPRESSION: 1. Chronic coarsened interstitial markings with diffuse bronchial wall thickening. 2. Right lower lung scarring. Electronically Signed   By: Waddell Calk M.D.   On: 10/02/2023 05:21    Recent Results (from the past 2160 hours)  Magnesium     Status: None   Collection Time: 09/21/23 10:10 AM  Result Value Ref Range   Magnesium 2.2 1.5 - 2.5 mg/dL  Lipid panel     Status: Abnormal   Collection Time: 09/21/23 10:10 AM  Result Value Ref Range   Cholesterol 160 0 - 200 mg/dL    Comment: ATP III Classification       Desirable:  < 200 mg/dL               Borderline High:  200 - 239 mg/dL          High:  > = 759 mg/dL   Triglycerides 878.9 0.0 - 149.0 mg/dL    Comment: Normal:  <849 mg/dLBorderline High:  150 - 199 mg/dL   HDL 66.39 (L) >60.99 mg/dL   VLDL 75.7 0.0 - 59.9 mg/dL   LDL Cholesterol 897 (H) 0 - 99 mg/dL   Total CHOL/HDL Ratio 5     Comment:                Men          Women1/2 Average Risk     3.4          3.3Average Risk          5.0          4.42X Average Risk          9.6          7.13X Average Risk          15.0          11.0                       NonHDL 126.19     Comment: NOTE:  Non-HDL goal should be 30 mg/dL higher than patient's LDL goal (i.e. LDL goal of < 70 mg/dL, would have non-HDL goal of < 100 mg/dL)  COMPLETE METABOLIC PANEL WITHOUT GFR     Status: Abnormal   Collection Time: 09/21/23 10:10 AM  Result Value Ref Range   Glucose, Bld 97 65 - 99 mg/dL    Comment: .            Fasting reference interval .    BUN 17 7 - 25 mg/dL   Creat 8.61 (H) 9.29 - 1.22 mg/dL   BUN/Creatinine Ratio 12 6 - 22 (calc)   Sodium 138 135 - 146 mmol/L   Potassium 5.2 3.5 - 5.3 mmol/L   Chloride 103 98 - 110 mmol/L   CO2 22 20 - 32 mmol/L   Calcium 9.5 8.6 - 10.3 mg/dL   Total Protein 6.9 6.1 - 8.1 g/dL   Albumin 4.4 3.6 -  5.1 g/dL   Globulin 2.5 1.9 - 3.7 g/dL (calc)   AG Ratio 1.8 1.0 - 2.5 (calc)   Total Bilirubin 1.2 0.2 - 1.2  mg/dL   Alkaline phosphatase (APISO) 62 35 - 144 U/L   AST 22 10 - 35 U/L   ALT 26 9 - 46 U/L  CBC with Differential/Platelet     Status: Abnormal   Collection Time: 09/21/23 10:10 AM  Result Value Ref Range   WBC 4.6 4.0 - 10.5 K/uL   RBC 5.66 4.22 - 5.81 Mil/uL   Hemoglobin 17.0 13.0 - 17.0 g/dL   HCT 48.7 60.9 - 47.9 %   MCV 90.6 78.0 - 100.0 fl   MCHC 33.3 30.0 - 36.0 g/dL   RDW 86.7 88.4 - 84.4 %   Platelets 146.0 (L) 150.0 - 400.0 K/uL   Neutrophils Relative % 72.3 43.0 - 77.0 %   Lymphocytes Relative 15.9 12.0 - 46.0 %   Monocytes Relative 10.2 3.0 - 12.0 %   Eosinophils Relative 1.0 0.0 - 5.0 %   Basophils Relative 0.6 0.0 - 3.0 %   Neutro Abs 3.3 1.4 - 7.7 K/uL   Lymphs Abs 0.7 0.7 - 4.0 K/uL   Monocytes Absolute 0.5 0.1 - 1.0 K/uL   Eosinophils Absolute 0.0 0.0 - 0.7 K/uL   Basophils Absolute 0.0 0.0 - 0.1 K/uL  VITAMIN D  25 Hydroxy (Vit-D Deficiency, Fractures)     Status: None   Collection Time: 09/21/23 10:10 AM  Result Value Ref Range   VITD 84.58 30.00 - 100.00 ng/mL  Insulin , random     Status: None   Collection Time: 09/21/23 10:10 AM  Result Value Ref Range   Insulin  11.2 uIU/mL    Comment:       Reference Range  < or = 18.4 .       Risk:       Optimal          < or = 18.4       Moderate         NA       High             >18.4 .       Adult cardiovascular event risk category       cut points (optimal, moderate, high)       are based on Insulin  Reference Interval       studies performed at [facility]       in 2022. SABRA   Hemoglobin A1c     Status: None   Collection Time: 09/21/23 10:10 AM  Result Value Ref Range   Hgb A1c MFr Bld 6.0 4.6 - 6.5 %    Comment: Glycemic Control Guidelines for People with Diabetes:Non Diabetic:  <6%Goal of Therapy: <7%Additional Action Suggested:  >8%   TSH     Status: None   Collection Time: 09/21/23 10:10 AM  Result Value Ref Range   TSH 1.03 0.35 - 5.50 uIU/mL  PSA     Status: None   Collection Time:  09/21/23 10:10 AM  Result Value Ref Range   PSA 0.35 0.10 - 4.00 ng/mL    Comment: Test performed using Access Hybritech PSA Assay, a parmagnetic partical, chemiluminecent immunoassay.  Urinalysis w microscopic + reflex cultur     Status: Abnormal   Collection Time: 09/21/23 10:10 AM   Specimen: Blood  Result Value Ref Range   Color, Urine YELLOW YELLOW   APPearance CLEAR CLEAR  Specific Gravity, Urine 1.014 1.001 - 1.035   pH > OR = 8.5 (A) 5.0 - 8.0   Glucose, UA NEGATIVE NEGATIVE   Bilirubin Urine NEGATIVE NEGATIVE   Ketones, ur NEGATIVE NEGATIVE   Hgb urine dipstick NEGATIVE NEGATIVE   Protein, ur NEGATIVE NEGATIVE   Nitrites, Initial NEGATIVE NEGATIVE   Leukocyte Esterase NEGATIVE NEGATIVE   WBC, UA NONE SEEN 0 - 5 /HPF   RBC / HPF NONE SEEN 0 - 2 /HPF   Squamous Epithelial / HPF NONE SEEN < OR = 5 /HPF   Bacteria, UA NONE SEEN NONE SEEN /HPF   Hyaline Cast NONE SEEN NONE SEEN /LPF  B12 and Folate Panel     Status: Abnormal   Collection Time: 09/21/23 10:10 AM  Result Value Ref Range   Vitamin B-12 1,095 (H) 211 - 911 pg/mL   Folate 20.9 >5.9 ng/mL  Iron, TIBC and Ferritin Panel     Status: None   Collection Time: 09/21/23 10:10 AM  Result Value Ref Range   Iron 156 50 - 180 mcg/dL   TIBC 633 749 - 574 mcg/dL (calc)   %SAT 43 20 - 48 % (calc)   Ferritin 48 24 - 380 ng/mL  Angiotensin converting enzyme     Status: None   Collection Time: 09/21/23 10:10 AM  Result Value Ref Range   Angiotensin-Converting Enzyme 29 9 - 67 U/L  Sedimentation rate     Status: None   Collection Time: 09/21/23 10:10 AM  Result Value Ref Range   Sed Rate 2 0 - 20 mm/hr  CRP High sensitivity     Status: None   Collection Time: 09/21/23 10:10 AM  Result Value Ref Range   CRP, High Sensitivity 1.670 0.000 - 5.000 mg/L    Comment: Note:  An elevated hs-CRP (>5 mg/L) should be repeated after 2 weeks to rule out recent infection or trauma.  REFLEXIVE URINE CULTURE     Status: None    Collection Time: 09/21/23 10:10 AM  Result Value Ref Range   Reflexve Urine Culture      Comment: NO CULTURE INDICATED  CBC with Differential     Status: Abnormal   Collection Time: 10/02/23  4:47 AM  Result Value Ref Range   WBC 4.3 4.0 - 10.5 K/uL   RBC 5.44 4.22 - 5.81 MIL/uL   Hemoglobin 16.7 13.0 - 17.0 g/dL   HCT 50.1 60.9 - 47.9 %   MCV 91.5 80.0 - 100.0 fL   MCH 30.7 26.0 - 34.0 pg   MCHC 33.5 30.0 - 36.0 g/dL   RDW 87.5 88.4 - 84.4 %   Platelets 137 (L) 150 - 400 K/uL    Comment: REPEATED TO VERIFY   nRBC 0.0 0.0 - 0.2 %   Neutrophils Relative % 72 %   Neutro Abs 3.0 1.7 - 7.7 K/uL   Lymphocytes Relative 16 %   Lymphs Abs 0.7 0.7 - 4.0 K/uL   Monocytes Relative 9 %   Monocytes Absolute 0.4 0.1 - 1.0 K/uL   Eosinophils Relative 1 %   Eosinophils Absolute 0.0 0.0 - 0.5 K/uL   Basophils Relative 1 %   Basophils Absolute 0.0 0.0 - 0.1 K/uL   Immature Granulocytes 1 %   Abs Immature Granulocytes 0.06 0.00 - 0.07 K/uL    Comment: Performed at [facility], [address], [place], [place] [address]  Basic metabolic panel     Status: Abnormal   Collection Time: 10/02/23  4:47 AM  Result Value Ref Range   Sodium 138 135 - 145 mmol/L   Potassium 4.5 3.5 - 5.1 mmol/L   Chloride 107 98 - 111 mmol/L   CO2 23 22 - 32 mmol/L   Glucose, Bld 126 (H) 70 - 99 mg/dL    Comment: Glucose reference range applies only to samples taken after fasting for at least 8 hours.   BUN 18 8 - 23 mg/dL   Creatinine, Ser 8.72 (H) 0.61 - 1.24 mg/dL   Calcium 9.0 8.9 - 89.6 mg/dL   GFR, Estimated 54 (L) >60 mL/min    Comment: (NOTE) Calculated using the CKD-EPI Creatinine Equation (2021)    Anion gap 8 5 - 15    Comment: Performed at [facility], [address] [place], [place] [address]  Troponin I (High Sensitivity)     Status: None   Collection Time: 10/02/23  4:57 AM  Result Value Ref Range   Troponin I (High Sensitivity) 5 <18 ng/L    Comment: (NOTE) Elevated high  sensitivity troponin I (hsTnI) values and significant  changes across serial measurements may suggest ACS but many other  chronic and acute conditions are known to elevate hsTnI results.  Refer to the Links section for chest pain algorithms and additional  guidance. Performed at [facility], [address] [place], [place] [address]   Urinalysis, Routine w reflex microscopic -Urine, Clean Catch     Status: Abnormal   Collection Time: 10/02/23  6:12 AM  Result Value Ref Range   Color, Urine YELLOW YELLOW   APPearance CLEAR CLEAR   Specific Gravity, Urine 1.014 1.005 - 1.030   pH 8.0 5.0 - 8.0   Glucose, UA 50 (A) NEGATIVE mg/dL   Hgb urine dipstick NEGATIVE NEGATIVE   Bilirubin Urine NEGATIVE NEGATIVE   Ketones, ur NEGATIVE NEGATIVE mg/dL   Protein, ur NEGATIVE NEGATIVE mg/dL   Nitrite NEGATIVE NEGATIVE   Leukocytes,Ua NEGATIVE NEGATIVE    Comment: Performed at [facility], [address] [place], [place] [address]        Beverley Adine Hummer, MD, MS

## 2023-11-23 NOTE — Patient Instructions (Signed)
  VISIT SUMMARY: During your visit today, we discussed your blood pressure, cholesterol, kidney function, leg sores, sleep issues, and GERD. We made some changes to your medications to better manage your symptoms and overall health. We also planned for some follow-up lab work and a future appointment to monitor your progress.  YOUR PLAN: -ESSENTIAL HYPERTENSION WITH MEDICATION-INDUCED LOWER EXTREMITY EDEMA AND ASSOCIATED SYMPTOMS: Your blood pressure is generally well-controlled at home, but it was high during today's visit. We suspect that amlodipine  is causing your leg swelling, tiredness, trouble sleeping, and restless legs. We will stop amlodipine  and start you on losartan  50 mg with hydrochlorothiazide  12.5 mg daily. Continue taking metoprolol  tartrate 50 mg twice a day. Please monitor your blood pressure and symptoms, and we will check your kidney function in one week.  -MIXED HYPERLIPIDEMIA ON REPATHA  THERAPY: Your cholesterol is being managed with Repatha  injections, fish oil, and flaxseed oil. Repatha  is effective and covered by a grant. Continue with Repatha  140 mg every 14 days, fish oil 1200 mg twice a day, and flaxseed oil 1200 mg three times a day. We will recheck your cholesterol levels at your next lab work.  -CHRONIC KIDNEY DISEASE, STAGE 3A: You have stage 3a chronic kidney disease. Losartan  may help protect your kidneys in the long term, but we need to monitor for any side effects. We will check your phosphorus and PTH levels and monitor your kidney function one week after starting losartan .  -RIGHT LOWER LEG ABRASION: You have a sore on your right leg, likely from minor trauma or friction, possibly worsened by leg swelling. We will stop amlodipine  to reduce the swelling. Continue applying Neosporin and try to keep your legs elevated. Compression stockings may also help.  -GASTROESOPHAGEAL REFLUX DISEASE (GERD): Your GERD is managed with famotidine . We will refill your prescription for  famotidine  20 mg.  -VITAMIN B12 ELEVATION AND HISTORY OF B GROUP VITAMIN DEFICIENCY: Your vitamin B12 levels were slightly high. You are currently taking cyanocobalamin  500 mcg every other day. We will recheck your vitamin B12 levels at your next lab work.  -FOOT DROP, LEFT FOOT: You have a chronic condition called foot drop, which affects your ability to lift the front part of your foot.  -GENERAL HEALTH MAINTENANCE: You are due for a flu shot, which we will administer today.  INSTRUCTIONS: Please return in one week for fasting lab work to check your kidney function and other levels. We will also have a follow-up appointment in one month to recheck your blood pressure and monitor your progress.

## 2023-12-01 ENCOUNTER — Other Ambulatory Visit (INDEPENDENT_AMBULATORY_CARE_PROVIDER_SITE_OTHER)

## 2023-12-01 DIAGNOSIS — E538 Deficiency of other specified B group vitamins: Secondary | ICD-10-CM

## 2023-12-01 DIAGNOSIS — I10 Essential (primary) hypertension: Secondary | ICD-10-CM | POA: Diagnosis not present

## 2023-12-01 DIAGNOSIS — N1831 Chronic kidney disease, stage 3a: Secondary | ICD-10-CM

## 2023-12-01 DIAGNOSIS — E782 Mixed hyperlipidemia: Secondary | ICD-10-CM | POA: Diagnosis not present

## 2023-12-01 LAB — LIPID PANEL
Cholesterol: 67 mg/dL (ref 0–200)
HDL: 35.5 mg/dL — ABNORMAL LOW (ref >39.00)
LDL Cholesterol: 11 mg/dL (ref 0–99)
NonHDL: 31.55
Total CHOL/HDL Ratio: 2
Triglycerides: 102 mg/dL (ref 0.0–149.0)
VLDL: 20.4 mg/dL (ref 0.0–40.0)

## 2023-12-01 LAB — BASIC METABOLIC PANEL WITH GFR
BUN: 25 mg/dL — ABNORMAL HIGH (ref 6–23)
CO2: 28 meq/L (ref 19–32)
Calcium: 9.2 mg/dL (ref 8.4–10.5)
Chloride: 102 meq/L (ref 96–112)
Creatinine, Ser: 1.22 mg/dL (ref 0.40–1.50)
GFR: 52.87 mL/min — ABNORMAL LOW (ref >60.00)
Glucose, Bld: 93 mg/dL (ref 70–99)
Potassium: 4 meq/L (ref 3.5–5.1)
Sodium: 139 meq/L (ref 135–145)

## 2023-12-01 LAB — HEPATIC FUNCTION PANEL
ALT: 24 U/L (ref 0–53)
AST: 21 U/L (ref 0–37)
Albumin: 4.5 g/dL (ref 3.5–5.2)
Alkaline Phosphatase: 54 U/L (ref 39–117)
Bilirubin, Direct: 0.4 mg/dL — ABNORMAL HIGH (ref 0.0–0.3)
Total Bilirubin: 1.5 mg/dL — ABNORMAL HIGH (ref 0.2–1.2)
Total Protein: 6.5 g/dL (ref 6.0–8.3)

## 2023-12-01 LAB — PHOSPHORUS: Phosphorus: 2.7 mg/dL (ref 2.3–4.6)

## 2023-12-01 LAB — VITAMIN B12: Vitamin B-12: 755 pg/mL (ref 211–911)

## 2023-12-02 LAB — PARATHYROID HORMONE, INTACT (NO CA): PTH: 30 pg/mL (ref 16–77)

## 2023-12-03 ENCOUNTER — Ambulatory Visit: Payer: Self-pay | Admitting: Family Medicine

## 2023-12-21 ENCOUNTER — Ambulatory Visit: Payer: Self-pay | Admitting: Family Medicine

## 2023-12-21 ENCOUNTER — Ambulatory Visit (INDEPENDENT_AMBULATORY_CARE_PROVIDER_SITE_OTHER): Admitting: Family Medicine

## 2023-12-21 VITALS — BP 126/75 | HR 64 | Temp 97.7°F | Resp 18 | Wt 152.0 lb

## 2023-12-21 DIAGNOSIS — B354 Tinea corporis: Secondary | ICD-10-CM

## 2023-12-21 DIAGNOSIS — I10 Essential (primary) hypertension: Secondary | ICD-10-CM | POA: Diagnosis not present

## 2023-12-21 DIAGNOSIS — H9313 Tinnitus, bilateral: Secondary | ICD-10-CM

## 2023-12-21 DIAGNOSIS — J3089 Other allergic rhinitis: Secondary | ICD-10-CM

## 2023-12-21 DIAGNOSIS — Z8546 Personal history of malignant neoplasm of prostate: Secondary | ICD-10-CM

## 2023-12-21 DIAGNOSIS — N1831 Chronic kidney disease, stage 3a: Secondary | ICD-10-CM

## 2023-12-21 DIAGNOSIS — H6993 Unspecified Eustachian tube disorder, bilateral: Secondary | ICD-10-CM | POA: Diagnosis not present

## 2023-12-21 DIAGNOSIS — E782 Mixed hyperlipidemia: Secondary | ICD-10-CM | POA: Diagnosis not present

## 2023-12-21 LAB — BASIC METABOLIC PANEL WITH GFR
BUN: 19 mg/dL (ref 6–23)
CO2: 28 meq/L (ref 19–32)
Calcium: 9.9 mg/dL (ref 8.4–10.5)
Chloride: 102 meq/L (ref 96–112)
Creatinine, Ser: 1.26 mg/dL (ref 0.40–1.50)
GFR: 50.84 mL/min — ABNORMAL LOW (ref >60.00)
Glucose, Bld: 91 mg/dL (ref 70–99)
Potassium: 4.6 meq/L (ref 3.5–5.1)
Sodium: 139 meq/L (ref 135–145)

## 2023-12-21 MED ORDER — FLUTICASONE PROPIONATE 50 MCG/ACT NA SUSP: 2.0000 | Freq: Every day | NASAL | Status: AC

## 2023-12-21 MED ORDER — CLOTRIMAZOLE 1 % EX CREA: 1.0000 | TOPICAL_CREAM | Freq: Two times a day (BID) | CUTANEOUS | Status: AC

## 2023-12-21 NOTE — Patient Instructions (Signed)
  VISIT SUMMARY: Today, we discussed your blood pressure, tinnitus, leg rash, cholesterol levels, and prostate cancer follow-up. We also reviewed your kidney function and provided recommendations for managing your symptoms.  YOUR PLAN: EUSTACHIAN TUBE DYSFUNCTION WITH NASAL CONGESTION: You have a chronic hissing sound in your ears and nasal congestion, possibly related to eustachian tube dysfunction. -Use Flonase nasal spray, 1-2 sprays in each nostril daily.  TINNITUS: You have a persistent hissing sound in your ears, likely due to past noise exposure. -Consider a hearing evaluation if your symptoms worsen.  HYPERTENSION: Your home blood pressure readings are normal, but you sometimes have higher readings in the office. -Continue your current blood pressure medication. -Monitor your blood pressure at home. -Recheck your blood pressure in 2-3 months.  CHRONIC KIDNEY DISEASE, UNSPECIFIED STAGE: Your previous lab results showed fluctuating kidney function. -We will order tests to monitor your kidney function.  HYPERLIPIDEMIA: Your LDL cholesterol levels have improved with Repatha . -Continue taking Repatha  as prescribed. -Monitor your lipid levels regularly.  PROSTATE CANCER, POST-RADIATION, UNDER SURVEILLANCE: Your PSA levels have slightly increased, and you have a follow-up with urology scheduled. -Continue your surveillance with urology. -Discuss your PSA levels with your urologist during your follow-up.  TINEA CORPORIS (RINGWORM) OF THE LEG: You have a rash on your leg that may be ringworm. -Use over-the-counter clotrimazole cream (Lotrimin) on the rash. -Monitor for improvement over the next 4 weeks.                      Contains text generated by Abridge.                                 Contains text generated by Abridge.

## 2023-12-21 NOTE — Progress Notes (Signed)
 Assessment & Plan   Assessment/Plan:   Assessment & Plan Eustachian tube dysfunction with nasal congestion Chronic hissing sound in ears, possibly related to eustachian tube dysfunction. Nasal congestion and stuffiness, possibly exacerbated by seasonal changes. No pain or significant fluid in ears. Saline nasal spray provides some relief. - Recommend trial of otc Flonase nasal spray, 1-2 sprays in each nostril daily  Tinnitus Chronic hissing sound in ears, possibly related to past noise exposure. No dizziness or headache. Not significantly bothersome to warrant intervention at this time. - Consider audiology/ENT referral for hearing evaluation if symptoms worsen  Hypertension Blood pressure readings at home are within normal range (126/75 mmHg). Occasional elevated readings in the office, possibly due to white coat hypertension. No symptoms of hypotension or side effects from current antihypertensive regimen.  Currently managed with losartan  50/hydrochlorothiazide  12.5 mg daily and metoprolol  50 mg twice daily. - Continue current antihypertensive regimen - Monitor blood pressure at home - Recheck blood pressure in 2 months  Chronic kidney disease 3 A Previous lab results showed fluctuating creatinine and GFR levels. Current renal function to be rechecked. No acute symptoms reported. - Order renal function tests to monitor kidney function  Hyperlipidemia LDL levels have decreased with Repatha  treatment. No side effects reported. Continued m weight onitoring of lipid levels is necessary. - Continue Repatha  as prescribed - Monitor lipid levels regularly  Prostate cancer, post-radiation, under surveillance Post-radiation for prostate cancer in 2021. PSA levels have slightly increased from 0.1 to 0.3. Follow-up with urology scheduled for mid-October. No acute symptoms reported. - Continue surveillance with urology - Discuss PSA levels with urologist during follow-up  Tinea corporis  (ringworm) of the leg Rash on leg with annular appearance, possibly tinea corporis. Treated with Neosporin without significant improvement. No pain or itching reported. - Recommend over-the-counter clotrimazole 1% cream (Lotrimin) for topical application twice daily - Monitor for improvement over 4 weeks    There are no discontinued medications.   Return in about 2 months (around 02/20/2024) for BP.        Subjective:   Encounter date: 12/21/2023  JAMELL LAYMON is a 89 y.o. male who has Essential hypertension; Mixed hyperlipidemia; Vitamin D  deficiency; Medication management; Malignant neoplasm of prostate (HCC); Pulmonary sarcoidosis (1988) ; Other abnormal glucose; BMI 25.0-25.9,adult; Plantar fasciitis of left foot; Gastroesophageal reflux disease without esophagitis; Aortic atherosclerosis (HCC) by CT scan 2021; Left foot drop; Radiation-induced lumbosacral plexopathy; Abrasion of right leg; Adverse reaction to ezetimibe; CKD stage 3a, GFR 45-59 ml/min (HCC); and B12 deficiency on their problem list..   He  has a past medical history of Essential hypertension (02/15/2007), Hyperlipidemia (07/13/2013), Prediabetes (07/13/2013), Prostate cancer [facility]), Pulmonary nodule (2008 resolved on f/u)  (02/15/2007), and Vitamin D  deficiency (07/13/2013).SABRA   He presents with chief complaint of Hypertension (1 month follow up. Pt is not fasting today. Blood pressure readings at home range from 126/75. //HM due- none ) .   History of Present Illness The patient presents for follow-up of blood pressure management.  His blood pressure was significantly elevated at the beginning of July but normalized to a range of 109/68 to 127/73 mmHg after the initial visit. However, he experienced another spike in blood pressure, reaching 200/111 mmHg, which led to a visit to the emergency department where he was prescribed amlodipine . Since then, his blood pressure at home has been ranging between 102/60 to 136/76  mmHg.  He is currently taking amlodipine  5 mg once daily and metoprolol  50 mg twice daily. He  recalls waking up feeling strange on the 19th of the previous month, with a blood pressure reading of 200/111 mmHg, prompting him to call 911 and visit the emergency department. His home blood pressure readings have been more stable since starting amlodipine .  He has a history of being on metoprolol  for several years and has recently added amlodipine . He monitors his blood pressure daily at home, typically around 8 AM, and reports feeling well with no chest pain or significant stress affecting his daily activities.  He is on Repatha  for cholesterol management, which he administers every two weeks. He inquires about the potential impact of Repatha  on his blood sugar levels, noting a slight elevation during his emergency department visit, but attributes it to stress. He has not experienced any side effects from Repatha  and is able to self-administer the injections.  [place] nearest neighbor, and has a medical alert device for emergencies. No chest pain and reports feeling well overall, with no significant stress impacting his daily activities.   ROS  Past Surgical History:  Procedure Laterality Date   PROSTATE BIOPSY  2020   PROSTATE BIOPSY  2014   TONSILECTOMY/ADENOIDECTOMY WITH MYRINGOTOMY     TREATMENT FISTULA ANAL      Outpatient Medications Prior to Visit  Medication Sig Dispense Refill   acetaminophen (TYLENOL) 500 MG tablet Take 500 mg by mouth as needed (prn).      aspirin 81 MG EC tablet Take 81 mg by mouth daily.     Cholecalciferol (VITAMIN D  PO) Take 2,000 Int'l Units by mouth 2 (two) times daily.      Cyanocobalamin  (VITAMIN B12 PO) Take 500 mcg by mouth every other day.     Evolocumab  (REPATHA  SURECLICK) 140 MG/ML SOAJ Inject 140 mg into the skin every 14 (fourteen) days. 6 mL 3   famotidine  (PEPCID ) 20 MG tablet Take 1 tablet (20 mg total)  by mouth 2 (two) times daily. Take 1 tablet 2 x / day to Prevent Heartburn & Indigestion . 180 tablet 3   Flaxseed, Linseed, (FLAX SEED OIL PO) Take 1,200 mg by mouth 3 (three) times daily.      losartan -hydrochlorothiazide  (HYZAAR) 50-12.5 MG tablet Take 1 tablet by mouth daily. 90 tablet 0   Magnesium 250 MG TABS Take 250 mg by mouth daily.     metoprolol  tartrate (LOPRESSOR ) 50 MG tablet Take 1 tablet (50 mg total) by mouth 2 (two) times daily. 180 tablet 3   Omega-3 Fatty Acids (FISH OIL PO) Take 1,200 mg by mouth 2 (two) times daily.      pantoprazole  (PROTONIX ) 20 MG tablet Take  1 tablet  Daily  to Prevent Heartburn & Indigestion                                                       /                                                        TAKE  BY                                             MOUTH 90 tablet 3   No facility-administered medications prior to visit.    Family History  Problem Relation Age of Onset   Hyperlipidemia Brother    Hypertension Brother    Breast cancer Neg Hx    Colon cancer Neg Hx    Prostate cancer Neg Hx    Pancreatic cancer Neg Hx     Social History   Socioeconomic History   Marital status: Widowed    Spouse name: Not on file   Number of children: Not on file   Years of education: Not on file   Highest education level: Not on file  Occupational History    Comment: retired  Tobacco Use   Smoking status: Never   Smokeless tobacco: Never  Vaping Use   Vaping status: Never Used  Substance and Sexual Activity   Alcohol use: No   Drug use: Never   Sexual activity: Not Currently  Other Topics Concern   Not on file  Social History Narrative   Not on file   Social Drivers of Health   Financial Resource Strain: Not on file  Food Insecurity: Not on file  Transportation Needs: Not on file  Physical Activity: Not on file  Stress: Not on file  Social Connections: Not on file  Intimate Partner Violence: Not  on file                                                                                                  Objective:  Physical Exam: BP 126/75 Comment: @home  reading  Pulse 64   Temp 97.7 F (36.5 C) (Temporal)   Resp 18   Wt 152 lb (68.9 kg)   SpO2 99%   BMI 24.53 kg/m    BP Readings from Last 3 Encounters:  12/21/23 126/75  11/23/23 (!) 140/78  10/22/23 (!) 158/84    Physical Exam GENERAL: Alert, cooperative, well developed, no acute distress. HEENT: Normocephalic, normal oropharynx, moist mucous membranes. CHEST: Clear to auscultation bilaterally, no wheezes, rhonchi, or crackles. CARDIOVASCULAR: Normal heart rate and rhythm, S1 and S2 normal without murmurs. ABDOMEN: Soft, non-tender, non-distended, without organomegaly, normal bowel sounds. EXTREMITIES: No cyanosis or edema. NEUROLOGICAL: Cranial nerves grossly intact, left foot drop,  SKIN: Abrasion on right leg, overall skin condition good.   GENERAL: Alert, cooperative, well developed, no acute distress HEENT: Normocephalic, normal oropharynx, moist mucous membranes, ears with minimal cerumen, no fluid, symmetric appearance CHEST: Clear to auscultation bilaterally, no wheezes, rhonchi, or crackles CARDIOVASCULAR: Normal heart rate and rhythm, S1 and S2 normal without murmurs ABDOMEN: Soft, non-tender, non-distended, without organomegaly, normal bowel sounds EXTREMITIES: No cyanosis or edema NEUROLOGICAL: Cranial nerves grossly intact, moves all extremities without gross motor or sensory deficit SKIN: Right lower anterior shin skin lesion with annular appearance, central clearing, and slight scaly border no ulceration, tenderness, erythema, or discharge  Physical Exam  DG Chest Portable 1 View Result Date: 10/02/2023 CLINICAL DATA:  Chest/back pain EXAM: PORTABLE CHEST 1 VIEW COMPARISON:  08/02/2014 FINDINGS: Aortic atherosclerosis. Heart size and mediastinal contours are unremarkable scarring within the right  lower lung. Chronic coarsened interstitial markings with diffuse bronchial wall thickening. No airspace consolidation, pleural effusion or pneumothorax. Osseous structures appear intact. IMPRESSION: 1. Chronic coarsened interstitial markings with diffuse bronchial wall thickening. 2. Right lower lung scarring. Electronically Signed   By: Waddell Calk M.D.   On: 10/02/2023 05:21    Recent Results (from the past 2160 hours)  CBC with Differential     Status: Abnormal   Collection Time: 10/02/23  4:47 AM  Result Value Ref Range   WBC 4.3 4.0 - 10.5 K/uL   RBC 5.44 4.22 - 5.81 MIL/uL   Hemoglobin 16.7 13.0 - 17.0 g/dL   HCT 50.1 60.9 - 47.9 %   MCV 91.5 80.0 - 100.0 fL   MCH 30.7 26.0 - 34.0 pg   MCHC 33.5 30.0 - 36.0 g/dL   RDW 87.5 88.4 - 84.4 %   Platelets 137 (L) 150 - 400 K/uL    Comment: REPEATED TO VERIFY   nRBC 0.0 0.0 - 0.2 %   Neutrophils Relative % 72 %   Neutro Abs 3.0 1.7 - 7.7 K/uL   Lymphocytes Relative 16 %   Lymphs Abs 0.7 0.7 - 4.0 K/uL   Monocytes Relative 9 %   Monocytes Absolute 0.4 0.1 - 1.0 K/uL   Eosinophils Relative 1 %   Eosinophils Absolute 0.0 0.0 - 0.5 K/uL   Basophils Relative 1 %   Basophils Absolute 0.0 0.0 - 0.1 K/uL   Immature Granulocytes 1 %   Abs Immature Granulocytes 0.06 0.00 - 0.07 K/uL    Comment: Performed at [facility], [address], [place], [place] [address]  Basic metabolic panel     Status: Abnormal   Collection Time: 10/02/23  4:47 AM  Result Value Ref Range   Sodium 138 135 - 145 mmol/L   Potassium 4.5 3.5 - 5.1 mmol/L   Chloride 107 98 - 111 mmol/L   CO2 23 22 - 32 mmol/L   Glucose, Bld 126 (H) 70 - 99 mg/dL    Comment: Glucose reference range applies only to samples taken after fasting for at least 8 hours.   BUN 18 8 - 23 mg/dL   Creatinine, Ser 8.72 (H) 0.61 - 1.24 mg/dL   Calcium 9.0 8.9 - 89.6 mg/dL   GFR, Estimated 54 (L) >60 mL/min    Comment: (NOTE) Calculated using the CKD-EPI Creatinine Equation (2021)     Anion gap 8 5 - 15    Comment: Performed at [facility], [address] [place], [place] [address]  Troponin I (High Sensitivity)     Status: None   Collection Time: 10/02/23  4:57 AM  Result Value Ref Range   Troponin I (High Sensitivity) 5 <18 ng/L    Comment: (NOTE) Elevated high sensitivity troponin I (hsTnI) values and significant  changes across serial measurements may suggest ACS but many other  chronic and acute conditions are known to elevate hsTnI results.  Refer to the Links section for chest pain algorithms and additional  guidance. Performed at [facility], [address], [place], [place] [address]   Urinalysis, Routine w reflex microscopic -Urine, Clean Catch     Status: Abnormal   Collection Time: 10/02/23  6:12 AM  Result  Value Ref Range   Color, Urine YELLOW YELLOW   APPearance CLEAR CLEAR   Specific Gravity, Urine 1.014 1.005 - 1.030   pH 8.0 5.0 - 8.0   Glucose, UA 50 (A) NEGATIVE mg/dL   Hgb urine dipstick NEGATIVE NEGATIVE   Bilirubin Urine NEGATIVE NEGATIVE   Ketones, ur NEGATIVE NEGATIVE mg/dL   Protein, ur NEGATIVE NEGATIVE mg/dL   Nitrite NEGATIVE NEGATIVE   Leukocytes,Ua NEGATIVE NEGATIVE    Comment: Performed at [facility], [address], [place], [place] [address]  Basic Metabolic Panel (BMET)     Status: Abnormal   Collection Time: 12/01/23  8:56 AM  Result Value Ref Range   Sodium 139 135 - 145 mEq/L   Potassium 4.0 3.5 - 5.1 mEq/L   Chloride 102 96 - 112 mEq/L   CO2 28 19 - 32 mEq/L   Glucose, Bld 93 70 - 99 mg/dL   BUN 25 (H) 6 - 23 mg/dL   Creatinine, Ser 8.77 0.40 - 1.50 mg/dL   GFR 47.12 (L) >39.99 mL/min    Comment: Calculated using the CKD-EPI Creatinine Equation (2021)   Calcium 9.2 8.4 - 10.5 mg/dL  A87     Status: None   Collection Time: 12/01/23  8:56 AM  Result Value Ref Range   Vitamin B-12 755 211 - 911 pg/mL  Hepatic function panel     Status: Abnormal   Collection Time: 12/01/23  8:56 AM   Result Value Ref Range   Total Bilirubin 1.5 (H) 0.2 - 1.2 mg/dL   Bilirubin, Direct 0.4 (H) 0.0 - 0.3 mg/dL   Alkaline Phosphatase 54 39 - 117 U/L   AST 21 0 - 37 U/L   ALT 24 0 - 53 U/L   Total Protein 6.5 6.0 - 8.3 g/dL   Albumin 4.5 3.5 - 5.2 g/dL  Lipid panel     Status: Abnormal   Collection Time: 12/01/23  8:56 AM  Result Value Ref Range   Cholesterol 67 0 - 200 mg/dL    Comment: ATP III Classification       Desirable:  < 200 mg/dL               Borderline High:  200 - 239 mg/dL          High:  > = 759 mg/dL   Triglycerides 897.9 0.0 - 149.0 mg/dL    Comment: Normal:  <849 mg/dLBorderline High:  150 - 199 mg/dL   HDL 64.49 (L) >60.99 mg/dL   VLDL 79.5 0.0 - 59.9 mg/dL   LDL Cholesterol 11 0 - 99 mg/dL   Total CHOL/HDL Ratio 2     Comment:                Men          Women1/2 Average Risk     3.4          3.3Average Risk          5.0          4.42X Average Risk          9.6          7.13X Average Risk          15.0          11.0                       NonHDL 31.55     Comment: NOTE:  Non-HDL goal should  be 30 mg/dL higher than patient's LDL goal (i.e. LDL goal of < 70 mg/dL, would have non-HDL goal of < 100 mg/dL)  Phosphorus     Status: None   Collection Time: 12/01/23  8:56 AM  Result Value Ref Range   Phosphorus 2.7 2.3 - 4.6 mg/dL  PTH, intact (no Ca)     Status: None   Collection Time: 12/01/23  8:56 AM  Result Value Ref Range   PTH 30 16 - 77 pg/mL    Comment: . Interpretive Guide    Intact PTH           Calcium ------------------    ----------           ------- Normal Parathyroid     Normal               Normal Hypoparathyroidism    Low or Low Normal    Low Hyperparathyroidism    Primary            Normal or High       High    Secondary          High                 Normal or Low    Tertiary           High                 High Non-Parathyroid     Hypercalcemia      Low or Low Normal    High .         Beverley Adine Hummer, MD, MS

## 2024-01-05 DIAGNOSIS — R399 Unspecified symptoms and signs involving the genitourinary system: Secondary | ICD-10-CM | POA: Diagnosis not present

## 2024-01-05 DIAGNOSIS — C61 Malignant neoplasm of prostate: Secondary | ICD-10-CM | POA: Diagnosis not present

## 2024-01-05 DIAGNOSIS — R972 Elevated prostate specific antigen [PSA]: Secondary | ICD-10-CM | POA: Diagnosis not present

## 2024-02-14 ENCOUNTER — Other Ambulatory Visit: Payer: Self-pay | Admitting: Family Medicine

## 2024-02-14 DIAGNOSIS — N1831 Chronic kidney disease, stage 3a: Secondary | ICD-10-CM

## 2024-02-14 DIAGNOSIS — I10 Essential (primary) hypertension: Secondary | ICD-10-CM

## 2024-02-17 ENCOUNTER — Other Ambulatory Visit: Payer: Self-pay | Admitting: Family Medicine

## 2024-02-17 DIAGNOSIS — N1831 Chronic kidney disease, stage 3a: Secondary | ICD-10-CM

## 2024-02-17 DIAGNOSIS — I10 Essential (primary) hypertension: Secondary | ICD-10-CM

## 2024-02-22 ENCOUNTER — Ambulatory Visit: Admitting: Family Medicine

## 2024-03-07 DIAGNOSIS — C61 Malignant neoplasm of prostate: Secondary | ICD-10-CM | POA: Diagnosis not present

## 2024-03-08 ENCOUNTER — Ambulatory Visit: Admitting: Family Medicine

## 2024-03-21 ENCOUNTER — Ambulatory Visit: Payer: Medicare HMO | Admitting: Nurse Practitioner

## 2024-03-27 ENCOUNTER — Encounter: Payer: Self-pay | Admitting: Family Medicine

## 2024-03-27 ENCOUNTER — Ambulatory Visit (INDEPENDENT_AMBULATORY_CARE_PROVIDER_SITE_OTHER): Admitting: Family Medicine

## 2024-03-27 VITALS — BP 126/73 | HR 77 | Temp 97.5°F | Ht 66.0 in | Wt 153.6 lb

## 2024-03-27 DIAGNOSIS — K219 Gastro-esophageal reflux disease without esophagitis: Secondary | ICD-10-CM

## 2024-03-27 DIAGNOSIS — L989 Disorder of the skin and subcutaneous tissue, unspecified: Secondary | ICD-10-CM | POA: Insufficient documentation

## 2024-03-27 DIAGNOSIS — L821 Other seborrheic keratosis: Secondary | ICD-10-CM | POA: Insufficient documentation

## 2024-03-27 DIAGNOSIS — C61 Malignant neoplasm of prostate: Secondary | ICD-10-CM

## 2024-03-27 DIAGNOSIS — N1831 Chronic kidney disease, stage 3a: Secondary | ICD-10-CM

## 2024-03-27 MED ORDER — FAMOTIDINE 20 MG PO TABS: 20.0000 mg | ORAL_TABLET | Freq: Every day | ORAL | 3 refills | Status: AC

## 2024-03-27 MED ORDER — TRIAMCINOLONE ACETONIDE 0.1 % EX CREA: 1.0000 | TOPICAL_CREAM | Freq: Two times a day (BID) | CUTANEOUS | 0 refills | Status: AC

## 2024-03-27 NOTE — Patient Instructions (Addendum)
 It was very nice to see you today!  VISIT SUMMARY: During your visit, we discussed your blood pressure management, chronic kidney disease, gastroesophageal reflux disease, a cutaneous lesion on your leg, upper respiratory symptoms, and prostate cancer surveillance. Your blood pressure is well controlled, and we have updated your medication for GERD. We also referred you to dermatology for further evaluation of the lesion on your leg.  YOUR PLAN: HYPERTENSION: Your blood pressure is well controlled at 126/73 mmHg with your current medications. -Continue taking losartan -hydrochlorothiazide  50-12.5 mg daily. -Continue taking metoprolol  tartrate 50 mg twice a day.  STAGE 3A CHRONIC KIDNEY DISEASE: Your chronic kidney disease is being managed with losartan  for renal protection. -Continue taking losartan  for renal protection.  GASTROESOPHAGEAL REFLUX DISEASE: Your GERD is well controlled with pantoprazole  and famotidine . There was an issue with your famotidine  prescription. -Your famotidine  prescription has been updated to reflect the correct dosage.  ACTINIC KERATOSIS, RIGHT LEG: You have a persistent lesion on your right leg that needs further evaluation. -Apply triamcinolone  ointment to the lesion twice daily. -You have been referred to dermatology for further evaluation.  SEBORRHEIC KERATOSIS, LEFT TEMPLE: You have a lesion on your left temple identified as seborrheic keratosis, which is likely benign. -Continue to monitor the lesion for any changes.  Return in about 3 months (around 06/25/2024) for BP.   Take care, Arvella Hummer, MD, MS   PLEASE NOTE:  If you had any lab tests, please let us  know if you have not heard back within a few days. You may see your results on mychart before we have a chance to review them but we will give you a call once they are reviewed by us .   If we ordered any referrals today, please let us  know if you have not heard from their office within the next  week.   If you had any urgent prescriptions sent in today, please check with the pharmacy within an hour of our visit to make sure the prescription was transmitted appropriately.   Please try these tips to maintain a healthy lifestyle:  Eat at least 3 REAL meals and 1-2 snacks per day.  Aim for no more than 5 hours between eating.  If you eat breakfast, please do so within one hour of getting up.   Each meal should contain half fruits/vegetables, one quarter protein, and one quarter carbs (no bigger than a computer mouse)  Cut down on sweet beverages. This includes juice, soda, and sweet tea.   Drink at least 1 glass of water with each meal and aim for at least 8 glasses per day  Exercise at least 150 minutes every week.

## 2024-03-27 NOTE — Progress Notes (Signed)
 " Assessment & Plan   Assessment/Plan:     Assessment & Plan Hypertension Well controlled with current medication regimen. Blood pressure reading is 126/73 mmHg. - Continue losartan -hydrochlorothiazide  50-12.5 mg daily. - Continue metoprolol  tartrate 50 mg twice a day.  Stage 3a chronic kidney disease Managed with losartan  for renal protection. - Continue losartan  for renal protection.  Gastroesophageal reflux disease Well controlled with pantoprazole  and famotidine . Prescription issue with famotidine  noted. - Updated famotidine  prescription to reflect correct dosage.  Skin lesions Persistent lesion on right leg, previously treated with clotrimazole  without improvement. Another on right temple. Differential includes squamous cell, actinic keratosis or other precancerous lesion. Dermatology referral needed for further evaluation. - Prescribed triamcinolone  ointment to apply twice daily to the lesion on leg - Referred to dermatology for further evaluation.  Seborrheic keratosis, left temple Lesion on left temple identified as seborrheic keratosis, likely benign. - Continue to monitor lesion for any changes.  Prostate Cancer Under survellence with urology      Medications Discontinued During This Encounter  Medication Reason   famotidine  (PEPCID ) 20 MG tablet     Return in about 3 months (around 06/25/2024) for BP.        Subjective:   Encounter date: 03/27/2024  Matthew Aguirre is a 89 y.o. male who has Essential hypertension; Mixed hyperlipidemia; Vitamin D  deficiency; Medication management; Prostate cancer (HCC); Pulmonary sarcoidosis (1988) ; Other abnormal glucose; BMI 25.0-25.9,adult; Plantar fasciitis of left foot; Gastroesophageal reflux disease without esophagitis; Aortic atherosclerosis (HCC) by CT scan 2021; Left foot drop; Radiation-induced lumbosacral plexopathy; Abrasion of right leg; Adverse reaction to ezetimibe; Stage 3a chronic kidney disease (HCC);  B12 deficiency; and Skin lesions on their problem list..   He  has a past medical history of Essential hypertension (02/15/2007), Hyperlipidemia (07/13/2013), Prediabetes (07/13/2013), Prostate cancer [facility]), Pulmonary nodule (2008 resolved on f/u)  (02/15/2007), and Vitamin D  deficiency (07/13/2013).SABRA   He presents with chief complaint of Medical Management of Chronic Issues (2 month follow up for BP. Pt has been in compliance with checking BP a home. BP has been running good at home. Pt would like to discuss a sore place on his leg & he has used the fugus cream as discussed for 4 weeks and nothing has changed. Pt prescription has changed famotidine  from 1x a day to 2x a day ) .   Discussed the use of AI scribe software for clinical note transcription with the patient, who gave verbal consent to proceed.  History of Present Illness Matthew Aguirre is an 89 year old male who presents for follow-up on blood pressure management.  Hypertension management - Blood pressure is well controlled at 126/73 mmHg. - Medication regimen includes losartan -hydrochlorothiazide  50-12.5 mg daily and metoprolol  tartrate 50 mg twice a day. - Compliant with antihypertensive medications.  Chronic kidney disease - CKD stage 3A. - Managed with losartan  for renal protection.  Gastroesophageal reflux disease - GERD managed with pantoprazole  20 mg in the morning and famotidine  20 mg in the evening. - Prescription refill issue: prescription incorrectly states need for 180 pills, which is not required.  Cutaneous lesion - Sore on leg, initially thought to be Candida, later considered tinea corporis. - Applying clotrimazole  1% cream twice daily for three months without improvement. - Lesion is red, scaly, round, not raised, and unchanged in appearance. - No pain or ulceration.  Upper respiratory symptoms - Recent nasal congestion and drainage attributed to a cold over the weekend. - Head drainage and sinus pressure,  especially in the right ear. - Crackling sounds in right ear when blowing nose. - No ear pain, ear drainage, sinus pain, sore throat, chest pain, or shortness of breath. - Uses Flonase , which improves breathing.  Prostate cancer surveillance - Recent urologist visit for PSA monitoring. - PSA levels have decreased.     ROS  Past Surgical History:  Procedure Laterality Date   PROSTATE BIOPSY  2020   PROSTATE BIOPSY  2014   TONSILECTOMY/ADENOIDECTOMY WITH MYRINGOTOMY     TREATMENT FISTULA ANAL      Outpatient Medications Prior to Visit  Medication Sig Dispense Refill   acetaminophen (TYLENOL) 500 MG tablet Take 500 mg by mouth as needed (prn).      aspirin 81 MG EC tablet Take 81 mg by mouth daily.     Cholecalciferol (VITAMIN D  PO) Take 2,000 Int'l Units by mouth 2 (two) times daily.      clotrimazole  (LOTRIMIN ) 1 % cream Apply 1 Application topically 2 (two) times daily.     Cyanocobalamin  (VITAMIN B12 PO) Take 500 mcg by mouth every other day.     Evolocumab  (REPATHA  SURECLICK) 140 MG/ML SOAJ Inject 140 mg into the skin every 14 (fourteen) days. 6 mL 3   Flaxseed, Linseed, (FLAX SEED OIL PO) Take 1,200 mg by mouth 3 (three) times daily.      fluticasone  (FLONASE ) 50 MCG/ACT nasal spray Place 2 sprays into both nostrils daily.     losartan -hydrochlorothiazide  (HYZAAR) 50-12.5 MG tablet TAKE ONE TABLET BY MOUTH ONE TIME DAILY 90 tablet 0   Magnesium 250 MG TABS Take 250 mg by mouth daily.     metoprolol  tartrate (LOPRESSOR ) 50 MG tablet Take 1 tablet (50 mg total) by mouth 2 (two) times daily. 180 tablet 3   Omega-3 Fatty Acids (FISH OIL PO) Take 1,200 mg by mouth 2 (two) times daily.      pantoprazole  (PROTONIX ) 20 MG tablet Take  1 tablet  Daily  to Prevent Heartburn & Indigestion                                                       /                                                        TAKE                                        BY                                              MOUTH 90 tablet 3   famotidine  (PEPCID ) 20 MG tablet Take 1 tablet (20 mg total) by mouth 2 (two) times daily. Take 1 tablet 2 x / day to Prevent Heartburn & Indigestion . 180 tablet 3   No facility-administered medications prior to visit.    Family History  Problem Relation Age of Onset   Hyperlipidemia Brother  Hypertension Brother    Breast cancer Neg Hx    Colon cancer Neg Hx    Prostate cancer Neg Hx    Pancreatic cancer Neg Hx     Social History   Socioeconomic History   Marital status: Widowed    Spouse name: Not on file   Number of children: Not on file   Years of education: Not on file   Highest education level: Not on file  Occupational History    Comment: retired  Tobacco Use   Smoking status: Never   Smokeless tobacco: Never  Vaping Use   Vaping status: Never Used  Substance and Sexual Activity   Alcohol use: No   Drug use: Never   Sexual activity: Not Currently  Other Topics Concern   Not on file  Social History Narrative   Not on file   Social Drivers of Health   Tobacco Use: Low Risk (03/27/2024)   Patient History    Smoking Tobacco Use: Never    Smokeless Tobacco Use: Never    Passive Exposure: Not on file  Financial Resource Strain: Not on file  Food Insecurity: Not on file  Transportation Needs: Not on file  Physical Activity: Not on file  Stress: Not on file  Social Connections: Not on file  Intimate Partner Violence: Not on file  Depression (PHQ2-9): Low Risk (03/27/2024)   Depression (PHQ2-9)    PHQ-2 Score: 0  Alcohol Screen: Not on file  Housing: Not on file  Utilities: Not on file  Health Literacy: Not on file                                                                                                  Objective:  Physical Exam: BP 126/73   Pulse 77   Temp (!) 97.5 F (36.4 C)   Ht 5' 6 (1.676 m)   Wt 153 lb 9.6 oz (69.7 kg)   SpO2 99%   BMI 24.79 kg/m    Physical Exam VITALS: BP- 126/73 GENERAL: Alert,  cooperative, well developed, no acute distress. HEENT: Normocephalic, normal oropharynx, moist mucous membranes, ears normal. Brown stuck on lesion on left temple. Scaling erythematous lesion on right temple CHEST: Clear to auscultation bilaterally, no wheezes, rhonchi, or crackles, lungs normal. CARDIOVASCULAR: Normal heart rate and rhythm, S1 and S2 normal without murmurs, heart normal. ABDOMEN: Soft, non-tender, non-distended, without organomegaly, normal bowel sounds. EXTREMITIES: No cyanosis or edema. NEUROLOGICAL: Cranial nerves grossly intact, moves all extremities without gross motor or sensory deficit. SKIN: Blister-like lesion on right leg with slight swelling.        Physical Exam  No results found.  No results found for this or any previous visit (from the past 2160 hours).      Beverley Adine Hummer, MD, MS  "

## 2024-04-03 ENCOUNTER — Ambulatory Visit

## 2024-04-03 VITALS — BP 128/62 | HR 78 | Temp 97.8°F | Ht 68.0 in | Wt 153.6 lb

## 2024-04-03 DIAGNOSIS — Z0000 Encounter for general adult medical examination without abnormal findings: Secondary | ICD-10-CM

## 2024-04-03 NOTE — Progress Notes (Signed)
 "  Chief Complaint  Patient presents with   Medicare Wellness     Subjective:   Matthew Aguirre is a 89 y.o. male who presents for a Medicare Annual Wellness Visit.  Visit info / Clinical Intake: Medicare Wellness Visit Type:: Subsequent Annual Wellness Visit Persons participating in visit and providing information:: patient Medicare Wellness Visit Mode:: In-person (required for WTM) Interpreter Needed?: No Pre-visit prep was completed: yes AWV questionnaire completed by patient prior to visit?: no Living arrangements:: (!) lives alone Patient's Overall Health Status Rating: very good Typical amount of pain: none Does pain affect daily life?: no Are you currently prescribed opioids?: no  Dietary Habits and Nutritional Risks How many meals a day?: 3 Eats fruit and vegetables daily?: yes Most meals are obtained by: preparing own meals In the last 2 weeks, have you had any of the following?: none Diabetic:: no  Functional Status Activities of Daily Living (to include ambulation/medication): Independent Ambulation: Independent Medication Administration: Independent Home Management (perform basic housework or laundry): Independent Manage your own finances?: yes Primary transportation is: driving Concerns about vision?: no *vision screening is required for WTM* Concerns about hearing?: no  Fall Screening Falls in the past year?: 0 Number of falls in past year: 0 Was there an injury with Fall?: 0 Fall Risk Category Calculator: 0 Patient Fall Risk Level: Low Fall Risk  Fall Risk Patient at Risk for Falls Due to: Medication side effect Fall risk Follow up: Falls prevention discussed; Education provided; Falls evaluation completed  Home and Transportation Safety: All rugs have non-skid backing?: N/A, no rugs All stairs or steps have railings?: N/A, no stairs (has a ramp) Grab bars in the bathtub or shower?: yes Have non-skid surface in bathtub or shower?: yes Good home  lighting?: yes Regular seat belt use?: yes Hospital stays in the last year:: no  Cognitive Assessment Difficulty concentrating, remembering, or making decisions? : no Will 6CIT or Mini Cog be Completed: yes What year is it?: 0 points What month is it?: 0 points Give patient an address phrase to remember (5 components): [address] [place] About what time is it?: 0 points Count backwards from 20 to 1: 0 points Say the months of the year in reverse: 0 points Repeat the address phrase from earlier: 0 points 6 CIT Score: 0 points  Advance Directives (For Healthcare) Does Patient Have a Medical Advance Directive?: Yes Type of Advance Directive: Healthcare Power of [place]; Living will Copy of Healthcare Power of Attorney in Chart?: Yes - validated most recent copy scanned in chart (See row information) Copy of Living Will in Chart?: Yes - validated most recent copy scanned in chart (See row information)  Reviewed/Updated  Reviewed/Updated: Reviewed All (Medical, Surgical, Family, Medications, Allergies, Care Teams, Patient Goals)    Allergies (verified) Pravastatin, Red yeast rice [cholestin], Sulfa antibiotics, Vibramycin [doxycycline calcium], and Zetia [ezetimibe]   Current Medications (verified) Outpatient Encounter Medications as of 04/03/2024  Medication Sig   acetaminophen (TYLENOL) 500 MG tablet Take 500 mg by mouth as needed (prn).    aspirin 81 MG EC tablet Take 81 mg by mouth daily.   Cholecalciferol (VITAMIN D  PO) Take 2,000 Int'l Units by mouth 2 (two) times daily.    Cyanocobalamin  (VITAMIN B12 PO) Take 500 mcg by mouth every other day.   Evolocumab  (REPATHA  SURECLICK) 140 MG/ML SOAJ Inject 140 mg into the skin every 14 (fourteen) days.   famotidine  (PEPCID ) 20 MG tablet Take 1 tablet (20 mg total) by  mouth at bedtime.   Flaxseed, Linseed, (FLAX SEED OIL PO) Take 1,200 mg by mouth 3 (three) times daily.    losartan -hydrochlorothiazide  (HYZAAR) 50-12.5 MG tablet  TAKE ONE TABLET BY MOUTH ONE TIME DAILY   Magnesium 250 MG TABS Take 250 mg by mouth daily.   metoprolol  tartrate (LOPRESSOR ) 50 MG tablet Take 1 tablet (50 mg total) by mouth 2 (two) times daily.   Omega-3 Fatty Acids (FISH OIL PO) Take 1,200 mg by mouth 2 (two) times daily.    pantoprazole  (PROTONIX ) 20 MG tablet Take  1 tablet  Daily  to Prevent Heartburn & Indigestion                                                       /                                                        TAKE                                        BY                                             MOUTH   triamcinolone  cream (KENALOG ) 0.1 % Apply 1 Application topically 2 (two) times daily.   clotrimazole  (LOTRIMIN ) 1 % cream Apply 1 Application topically 2 (two) times daily.   fluticasone  (FLONASE ) 50 MCG/ACT nasal spray Place 2 sprays into both nostrils daily. (Patient not taking: Reported on 04/03/2024)   No facility-administered encounter medications on file as of 04/03/2024.    History: Past Medical History:  Diagnosis Date   Essential hypertension 02/15/2007   Hyperlipidemia 07/13/2013   Prediabetes 07/13/2013   Prostate cancer (HCC)    Pulmonary nodule (2008 resolved on f/u)  02/15/2007   Vitamin D  deficiency 07/13/2013   Past Surgical History:  Procedure Laterality Date   PROSTATE BIOPSY  2020   PROSTATE BIOPSY  2014   TONSILECTOMY/ADENOIDECTOMY WITH MYRINGOTOMY     TREATMENT FISTULA ANAL     Family History  Problem Relation Age of Onset   Hyperlipidemia Brother    Hypertension Brother    Breast cancer Neg Hx    Colon cancer Neg Hx    Prostate cancer Neg Hx    Pancreatic cancer Neg Hx    Social History   Occupational History    Comment: retired  Tobacco Use   Smoking status: Never   Smokeless tobacco: Never  Vaping Use   Vaping status: Never Used  Substance and Sexual Activity   Alcohol use: No   Drug use: Never   Sexual activity: Not Currently   Tobacco Counseling Counseling given: Not  Answered  SDOH Screenings   Food Insecurity: No Food Insecurity (04/03/2024)  Housing: Unknown (04/03/2024)  Transportation Needs: No Transportation Needs (04/03/2024)  Utilities: Not At Risk (04/03/2024)  Alcohol Screen: Low Risk (04/03/2024)  Depression (PHQ2-9): Low Risk (04/03/2024)  Financial Resource  Strain: Low Risk (04/03/2024)  Physical Activity: Insufficiently Active (04/03/2024)  Social Connections: Moderately Isolated (04/03/2024)  Stress: No Stress Concern Present (04/03/2024)  Tobacco Use: Low Risk (04/03/2024)  Health Literacy: Adequate Health Literacy (04/03/2024)   See flowsheets for full screening details  Depression Screen PHQ 2 & 9 Depression Scale- Over the past 2 weeks, how often have you been bothered by any of the following problems? Little interest or pleasure in doing things: 0 Feeling down, depressed, or hopeless (PHQ Adolescent also includes...irritable): 0 PHQ-2 Total Score: 0 Trouble falling or staying asleep, or sleeping too much: 0 Feeling tired or having little energy: 0 Poor appetite or overeating (PHQ Adolescent also includes...weight loss): 0 Feeling bad about yourself - or that you are a failure or have let yourself or your family down: 0 Trouble concentrating on things, such as reading the newspaper or watching television (PHQ Adolescent also includes...like school work): 0 Moving or speaking so slowly that other people could have noticed. Or the opposite - being so fidgety or restless that you have been moving around a lot more than usual: 0 Thoughts that you would be better off dead, or of hurting yourself in some way: 0 PHQ-9 Total Score: 0 If you checked off any problems, how difficult have these problems made it for you to do your work, take care of things at home, or get along with other people?: Not difficult at all     Goals Addressed               This Visit's Progress     Patient Stated (pt-stated)        04/03/2024, eat good and practice  organ             Objective:    Today's Vitals   04/03/24 1004  BP: 128/62  Pulse: 78  Temp: 97.8 F (36.6 C)  TempSrc: Oral  SpO2: 95%  Weight: 153 lb 9.6 oz (69.7 kg)  Height: 5' 8 (1.727 m)   Body mass index is 23.35 kg/m.  Hearing/Vision screen Vision Screening - Comments:: Regular eye exams, looking for new eye doctor Immunizations and Health Maintenance Health Maintenance  Topic Date Due   Medicare Annual Wellness (AWV)  03/21/2024   COVID-19 Vaccine (3 - Moderna risk series) 04/12/2024 (Originally 03/26/2020)   DTaP/Tdap/Td (2 - Tdap) 05/16/2025   Pneumococcal Vaccine: 50+ Years  Completed   Influenza Vaccine  Completed   Zoster Vaccines- Shingrix  Completed   Meningococcal B Vaccine  Aged Out        Assessment/Plan:  This is a routine wellness examination for [employer].  Patient Care Team: Sebastian Beverley NOVAK, MD as PCP - General (Family Medicine) Nieves Cough, MD as Consulting Physician (Urology)  I have personally reviewed and noted the following in the patients chart:   Medical and social history Use of alcohol, tobacco or illicit drugs  Current medications and supplements including opioid prescriptions. Functional ability and status Nutritional status Physical activity Advanced directives List of other physicians Hospitalizations, surgeries, and ER visits in previous 12 months Vitals Screenings to include cognitive, depression, and falls Referrals and appointments  No orders of the defined types were placed in this encounter.  In addition, I have reviewed and discussed with patient certain preventive protocols, quality metrics, and best practice recommendations. A written personalized care plan for preventive services as well as general preventive health recommendations were provided to patient.   Ardella FORBES Dawn, LPN   [id number]   No  follow-ups on file.  After Visit Summary: (In Person-Printed) AVS printed and given to the  patient  Nurse Notes: No voiced or noted concerns at this time  "

## 2024-04-03 NOTE — Patient Instructions (Signed)
 [name],  Thank you for taking the time for your Medicare Wellness Visit. I appreciate your continued commitment to your health goals. Please review the care plan we discussed, and feel free to reach out if I can assist you further.  Please note that Annual Wellness Visits do not include a physical exam. Some assessments may be limited, especially if the visit was conducted virtually. If needed, we may recommend an in-person follow-up with your provider.  Ongoing Care Seeing your primary care provider every 3 to 6 months helps us  monitor your health and provide consistent, personalized care.   Referrals If a referral was made during today's visit and you haven't received any updates within two weeks, please contact the referred provider directly to check on the status.  Recommended Screenings:  Health Maintenance  Topic Date Due   Medicare Annual Wellness Visit  03/21/2024   COVID-19 Vaccine (3 - Moderna risk series) 04/12/2024*   DTaP/Tdap/Td vaccine (2 - Tdap) 05/16/2025   Pneumococcal Vaccine for age over 58  Completed   Flu Shot  Completed   Zoster (Shingles) Vaccine  Completed   Meningitis B Vaccine  Aged Out  *Topic was postponed. The date shown is not the original due date.       04/03/2024   10:12 AM  Advanced Directives  Does Patient Have a Medical Advance Directive? Yes  Type of Estate Agent of [place];Living will  Copy of Healthcare Power of Attorney in Chart? Yes - validated most recent copy scanned in chart (See row information)    Vision: Annual vision screenings are recommended for early detection of glaucoma, cataracts, and diabetic retinopathy. These exams can also reveal signs of chronic conditions such as diabetes and high blood pressure.  Dental: Annual dental screenings help detect early signs of oral cancer, gum disease, and other conditions linked to overall health, including heart disease and diabetes.  Please see the attached  documents for additional preventive care recommendations.

## 2024-06-01 ENCOUNTER — Ambulatory Visit: Admitting: Dermatology

## 2024-06-26 ENCOUNTER — Ambulatory Visit: Admitting: Family Medicine

## 2025-04-09 ENCOUNTER — Ambulatory Visit
